# Patient Record
Sex: Female | Born: 1955 | Race: White | Hispanic: No | Smoking: Never smoker
Health system: Southern US, Community
[De-identification: ages and names within clinical notes are randomized; demographics above are authoritative.]

## PROBLEM LIST (undated history)

## (undated) DIAGNOSIS — S32049A Unspecified fracture of fourth lumbar vertebra, initial encounter for closed fracture: Secondary | ICD-10-CM

## (undated) DIAGNOSIS — I480 Paroxysmal atrial fibrillation: Secondary | ICD-10-CM

## (undated) DIAGNOSIS — I10 Essential (primary) hypertension: Secondary | ICD-10-CM

## (undated) DIAGNOSIS — I493 Ventricular premature depolarization: Secondary | ICD-10-CM

## (undated) DIAGNOSIS — E2740 Unspecified adrenocortical insufficiency: Secondary | ICD-10-CM

## (undated) DIAGNOSIS — R002 Palpitations: Secondary | ICD-10-CM

## (undated) HISTORY — DX: Palpitations: R00.2

## (undated) HISTORY — PX: HAND SURGERY: SHX662

## (undated) HISTORY — DX: Unspecified adrenocortical insufficiency: E27.40

## (undated) HISTORY — PX: NASAL SINUS SURGERY: SHX719

## (undated) HISTORY — DX: Ventricular premature depolarization: I49.3

## (undated) HISTORY — DX: Essential (primary) hypertension: I10

---

## 1999-09-18 ENCOUNTER — Encounter: Admission: RE | Admit: 1999-09-18 | Discharge: 1999-09-18 | Payer: Self-pay | Admitting: Cardiology

## 1999-09-19 ENCOUNTER — Encounter: Payer: Self-pay | Admitting: Cardiology

## 2002-11-15 ENCOUNTER — Encounter (INDEPENDENT_AMBULATORY_CARE_PROVIDER_SITE_OTHER): Payer: Self-pay | Admitting: *Deleted

## 2002-11-15 ENCOUNTER — Ambulatory Visit (HOSPITAL_COMMUNITY): Admission: RE | Admit: 2002-11-15 | Discharge: 2002-11-15 | Payer: Self-pay | Admitting: Gastroenterology

## 2003-06-20 ENCOUNTER — Encounter: Payer: Self-pay | Admitting: Internal Medicine

## 2003-06-20 ENCOUNTER — Encounter: Admission: RE | Admit: 2003-06-20 | Discharge: 2003-06-20 | Payer: Self-pay | Admitting: Internal Medicine

## 2006-04-03 ENCOUNTER — Inpatient Hospital Stay (HOSPITAL_COMMUNITY): Admission: EM | Admit: 2006-04-03 | Discharge: 2006-04-06 | Payer: Self-pay | Admitting: Emergency Medicine

## 2006-04-04 ENCOUNTER — Ambulatory Visit: Payer: Self-pay | Admitting: Internal Medicine

## 2006-04-04 ENCOUNTER — Encounter (INDEPENDENT_AMBULATORY_CARE_PROVIDER_SITE_OTHER): Payer: Self-pay | Admitting: Specialist

## 2006-09-03 ENCOUNTER — Emergency Department (HOSPITAL_COMMUNITY): Admission: EM | Admit: 2006-09-03 | Discharge: 2006-09-03 | Payer: Self-pay | Admitting: Emergency Medicine

## 2006-09-08 ENCOUNTER — Emergency Department (HOSPITAL_COMMUNITY): Admission: EM | Admit: 2006-09-08 | Discharge: 2006-09-08 | Payer: Self-pay | Admitting: Emergency Medicine

## 2007-04-20 ENCOUNTER — Ambulatory Visit (HOSPITAL_COMMUNITY): Admission: RE | Admit: 2007-04-20 | Discharge: 2007-04-20 | Payer: Self-pay | Admitting: Gastroenterology

## 2007-07-20 ENCOUNTER — Ambulatory Visit (HOSPITAL_COMMUNITY): Admission: RE | Admit: 2007-07-20 | Discharge: 2007-07-20 | Payer: Self-pay | Admitting: Gastroenterology

## 2007-11-28 ENCOUNTER — Emergency Department (HOSPITAL_COMMUNITY): Admission: EM | Admit: 2007-11-28 | Discharge: 2007-11-28 | Payer: Self-pay | Admitting: Emergency Medicine

## 2007-12-23 ENCOUNTER — Ambulatory Visit: Payer: Self-pay | Admitting: Hematology and Oncology

## 2007-12-24 LAB — CBC WITH DIFFERENTIAL/PLATELET
BASO%: 0.8 % (ref 0.0–2.0)
Eosinophils Absolute: 0 10*3/uL (ref 0.0–0.5)
HCT: 42.2 % (ref 34.8–46.6)
HGB: 14.4 g/dL (ref 11.6–15.9)
MCH: 29.9 pg (ref 26.0–34.0)
MONO#: 0.9 10*3/uL (ref 0.1–0.9)
MONO%: 6.4 % (ref 0.0–13.0)
NEUT#: 12.4 10*3/uL — ABNORMAL HIGH (ref 1.5–6.5)
Platelets: 324 10*3/uL (ref 145–400)
WBC: 14.7 10*3/uL — ABNORMAL HIGH (ref 3.9–10.0)
lymph#: 1.2 10*3/uL (ref 0.9–3.3)

## 2007-12-24 LAB — MORPHOLOGY: PLT EST: ADEQUATE

## 2007-12-29 LAB — COMPREHENSIVE METABOLIC PANEL
ALT: 14 U/L (ref 0–35)
AST: 18 U/L (ref 0–37)
Albumin: 4.3 g/dL (ref 3.5–5.2)
Chloride: 100 mEq/L (ref 96–112)
Glucose, Bld: 101 mg/dL — ABNORMAL HIGH (ref 70–99)
Potassium: 4 mEq/L (ref 3.5–5.3)
Total Protein: 7.1 g/dL (ref 6.0–8.3)

## 2008-01-21 LAB — CBC WITH DIFFERENTIAL/PLATELET
BASO%: 0.1 % (ref 0.0–2.0)
HCT: 41.2 % (ref 34.8–46.6)
LYMPH%: 8.6 % — ABNORMAL LOW (ref 14.0–48.0)
MCH: 29.9 pg (ref 26.0–34.0)
MCHC: 33.8 g/dL (ref 32.0–36.0)
MCV: 88.4 fL (ref 81.0–101.0)
MONO%: 8 % (ref 0.0–13.0)
NEUT#: 12.1 10*3/uL — ABNORMAL HIGH (ref 1.5–6.5)
NEUT%: 83.2 % — ABNORMAL HIGH (ref 39.6–76.8)
RBC: 4.66 10*6/uL (ref 3.70–5.32)
WBC: 14.6 10*3/uL — ABNORMAL HIGH (ref 3.9–10.0)

## 2008-06-09 ENCOUNTER — Ambulatory Visit (HOSPITAL_COMMUNITY): Admission: RE | Admit: 2008-06-09 | Discharge: 2008-06-09 | Payer: Self-pay | Admitting: Internal Medicine

## 2008-09-08 ENCOUNTER — Encounter (HOSPITAL_COMMUNITY): Admission: RE | Admit: 2008-09-08 | Discharge: 2008-09-22 | Payer: Self-pay | Admitting: Internal Medicine

## 2008-10-10 ENCOUNTER — Encounter: Admission: RE | Admit: 2008-10-10 | Discharge: 2008-10-10 | Payer: Self-pay | Admitting: Internal Medicine

## 2008-12-07 ENCOUNTER — Encounter (HOSPITAL_COMMUNITY): Admission: RE | Admit: 2008-12-07 | Discharge: 2009-01-26 | Payer: Self-pay | Admitting: Internal Medicine

## 2009-05-25 ENCOUNTER — Encounter (HOSPITAL_COMMUNITY): Admission: RE | Admit: 2009-05-25 | Discharge: 2009-06-22 | Payer: Self-pay | Admitting: Orthopaedic Surgery

## 2009-06-26 ENCOUNTER — Encounter (HOSPITAL_COMMUNITY): Admission: RE | Admit: 2009-06-26 | Discharge: 2009-07-26 | Payer: Self-pay | Admitting: Orthopaedic Surgery

## 2009-07-27 ENCOUNTER — Encounter (HOSPITAL_COMMUNITY): Admission: RE | Admit: 2009-07-27 | Discharge: 2009-08-26 | Payer: Self-pay | Admitting: Orthopaedic Surgery

## 2010-06-20 ENCOUNTER — Encounter (HOSPITAL_COMMUNITY): Admission: RE | Admit: 2010-06-20 | Discharge: 2010-06-22 | Payer: Self-pay | Admitting: Orthopaedic Surgery

## 2010-06-26 ENCOUNTER — Encounter (HOSPITAL_COMMUNITY): Admission: RE | Admit: 2010-06-26 | Discharge: 2010-07-26 | Payer: Self-pay | Admitting: Family Medicine

## 2010-08-01 ENCOUNTER — Encounter (HOSPITAL_COMMUNITY)
Admission: RE | Admit: 2010-08-01 | Discharge: 2010-08-31 | Payer: Self-pay | Source: Home / Self Care | Attending: Family Medicine | Admitting: Family Medicine

## 2010-09-05 ENCOUNTER — Encounter (HOSPITAL_COMMUNITY)
Admission: RE | Admit: 2010-09-05 | Discharge: 2010-10-05 | Payer: Self-pay | Source: Home / Self Care | Attending: Orthopaedic Surgery | Admitting: Orthopaedic Surgery

## 2010-10-14 ENCOUNTER — Encounter: Payer: Self-pay | Admitting: Internal Medicine

## 2010-10-14 ENCOUNTER — Encounter: Payer: Self-pay | Admitting: Gastroenterology

## 2011-02-08 NOTE — Op Note (Signed)
NAMEJEANELLE, DAKE                ACCOUNT NO.:  [id number]   MEDICAL RECORD NO.:  [id number]          PATIENT TYPE:  INP   LOCATION:  A312                          FACILITY:  [facility]   PHYSICIAN:  Lionel December, M.D.    DATE OF BIRTH:  [birth year]   DATE OF PROCEDURE:  04/04/2006  DATE OF DISCHARGE:                                 OPERATIVE REPORT   PROCEDURE:  Flexible sigmoidoscopy.   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  A CT reveals thickened descending colon in  sigmoid as well as the rectum.  Stool studies are pending.  She is  undergoing diagnostic sigmoidoscopy to make sure she does not have any  associated colitis such as CMV or C diff.  Procedure and risks were reviewed  with the patient, and informed consent was obtained.   MEDICATIONS FOR CONSCIOUS SEDATION:  Demerol 25 mg IV, Versed 4 mg IV.   FINDINGS:  Procedure performed in endoscopy suite.  The patient's vital  signs and O2 saturation were monitored during the procedure and remained  stable.  The patient was placed in left lateral position and rectal  examination performed.  She was complaining of some discomfort.  Therefore,  Xylocaine jelly was applied to anal canal.  Olympus videoscope was placed in  rectum where mucosa was noted to be diffusely erythematous, friable with  multiple ulcers.  Scope was passed into sigmoid colon where there were  similar changes.  Scope was passed to the junction of sigmoid and descending  colon.  Did not attempt to pass the scope any further.  Pictures taken for  the record.  As the scope was withdrawn, mucosa was once again carefully  examined, and there were no polyps or pseudomembranes.  Biopsy was taken  from the sigmoid colon and rectum and submitted in separate containers.  Endoscope was withdrawn.  The patient tolerated the procedure  well.   FINAL DIAGNOSIS:  Active colitis involving the rectum and sigmoid colon.  Based on CT, it would appear to be distal disease limited to splenic  flexure.  I suspect this is relapse of her ulcerative colitis.  Biopsy and  cultures will rule out enteric infections or CMV colitis, etc.   RECOMMENDATIONS:  Will continue Solu-Medrol at 40 mg IV q.12h. along with  her Colazal and Rowasa enemas.   She will have CBC in the a.m.  We will also place a PPD just in case  Remicade needed at a later date.      Lionel December, M.D.  Electronically Signed     NR/MEDQ  D:  04/04/2006  T:  04/04/2006  Job:  [id number]   cc:   Bernette Redbird, M.D.  Fax: [phone]

## 2011-02-08 NOTE — Discharge Summary (Signed)
NAMEMATTILYNN, Betty Bryan                ACCOUNT NO.:  [id number]   MEDICAL RECORD NO.:  [id number]          PATIENT TYPE:  INP   LOCATION:  A312                          FACILITY:  [facility]   PHYSICIAN:  Margaretmary Dys, M.D.DATE OF BIRTH:  Feb 12, 1956   DATE OF ADMISSION:  04/03/2006  DATE OF DISCHARGE:  07/15/2007LH                                 DISCHARGE SUMMARY   DISCHARGE DIAGNOSES:  Acute exacerbation of ulcerative colitis.   DISCHARGE MEDICATIONS:  1.  Colazal 750 mg three capsules three times a day.  2.  Atenolol 50 mg one quarter tablet b.i.d.  3.  Entocort 3 mg capsules, three capsules every morning.  4.  Vitamin D 50,000 units one capsule three times a week.  5.  Hyoscyamine 0.125 mg.  6.  Flovent 50 mcg.  7.  Claritin 10 mg p.o. daily.  8.  Prevacid 30 mg p.o. daily.  9.  Rowasa p.r.n. 2-3 times weekly.  10. Prednisone 20 mg p.o. daily until seen by her GI specialist Dr. Matthias Hughs.   DISCHARGE INSTRUCTIONS:  1.  Diet:  Regular diet.  2.  Activities:  As tolerated.   CONSULTATIONS:  GI.   PROCEDURES:  Colonoscopy with finding of extensive ulcerative colitis.  Could not rule out small bowel obstruction distally.   HOSPITAL COURSE:  [name] is a 55 year old Caucasian female with 9-year  history of distal ulcerative colitis who was doing fairly well the few weeks  prior to admission when she noticed scant amount of bleeding with bowel  movements in the morning.  She was normally referred by Dr. Matthias Hughs.  The  patient also complains of some dysphagia.  She was subsequently admitted to  the medical floor for evaluation due to left sided abdominal pain, bloody  diarrhea and thickened half of the colon which was seen on CT scan  consistent with ulcerative colitis, the patient has done fairly well, and  after the dose of steroids were introduced for  ulcerative colitis, the  patient did fairly well.   All other home medications were resumed.  The patient did fairly well  during  the course of admission, and I saw her on April 06, 2006, and she was doing  great, and she was discharged home in satisfactory condition.   SPECIAL INSTRUCTIONS:  The patient will need the   END OF DICTATION.      Margaretmary Dys, M.D.  Electronically Signed     AM/MEDQ  D:  04/17/2006  T:  04/17/2006  Job:  [id number]

## 2011-02-08 NOTE — Consult Note (Signed)
Betty Bryan                ACCOUNT NO.:  [id number]   MEDICAL RECORD NO.:  [id number]          PATIENT TYPE:  INP   LOCATION:  A312                          FACILITY:  [facility]   PHYSICIAN:  Lionel December, M.D.    DATE OF BIRTH:  [birth year]   DATE OF CONSULTATION:  04/03/2006  DATE OF DISCHARGE:                                   CONSULTATION   REASON FOR CONSULTATION:  Left-sided abdominal pain, bloody diarrhea, and  thickened distal half of the colon (CT) in a patient with known ulcerative  colitis.   HISTORY OF PRESENT ILLNESS:  Betty Bryan is a 55 year old Caucasian female with a  nine-year history of distal ulcerative colitis who has been doing fairly  well until a week ago when she noted scant amount of bleeding with bowel  movements usually in the morning.  She was actually seen by Dr. Bernette Redbird, who is the patient's gastroenterologist, three days ago and she was  complaining more of dysphagia and less about her bowels.  For for the last  two days or so she has been having excruciating postprandial left upper  quadrant pain and has had multiple stools.  Yesterday, she had over ten  stools and she had multiple stools today.  She has been afraid to eat.  She  describes pain as severe gas pain which doubled her over.  She did not  experience nausea, vomiting, fever, or chills.  With the diarrhea she knew  that she would not be able to make it to [facility] and she, therefore, came  to the emergency room at [facility].  She was evaluated and noted to have  WBC of 17.1.  She had abdominopelvic CT which revealed thickened descending  colon, sigmoid colon as well as rectum.  Proximal half of colon is normal.  No free fluid noted.  She had fibroid in her uterus and a 1-cm lesion  involving the left lobe of the liver.  She was, therefore, admitted to Dr.  Chancy Milroy service for management.  The patient states she has had UC for  nine years.  She initially was on Asacol which she  believes she took for  seven and a half years and did great for at least five.  For the last 18  months she also has been on Antrocol.  She states few times she has come off  her, the  diarrhea has relapsed, and she has gone back on it.  For the last  few months she has been also using Rowasa every second or third night.  Her  last colonoscopy, according to Dr. Matthias Hughs, was three years ago and her  disease was an active.  She has lost a few pounds recently.  She feels it is  due to GI fluid losses.  She was surprised to find her weight at 101.4  pounds.  She states she generally is around 108 or 109 pounds.  She has had  a good appetite.  There is no history of recent antibiotic use or travel out  of country.  She drinks city water.  She was recently diagnosed with  osteoporosis and vitamin D deficiency.  She states she has been intolerant  of calcium as she gets bloating and diarrhea.  The patient states she has  never been hospitalized for UC or any other conditions.   MEDICATIONS:  Her usual meds at home include Colazal 2.25 gm t.i.d.,  Antrocol EC 9 mg daily, Rowasa enema one every two to three days, Flonase  nasal spray daily,  ferritin 10 mg daily,  atenolol 12.5 mg daily, folic  acid 400 mcg daily, and vitamin B6, B12, and zinc daily.   She was also given a prescription for vitamin D 50,000 units three times a  week.   PAST MEDICAL HISTORY:  1.  History of ulcerative colitis as above.  2.  History of mild tachycardia, well controlled with low-dose beta blocker.  3.  She recently had a bone density study and was diagnosed with      osteoporosis.  She remembers her T value was -2.6.  She had vitamin D      level of 25, hydroxycholecalciferol level was low at 13.8.  The patient      states she has tested negative for celiac disease before.   PAST SURGICAL HISTORY:  Nasal surgery for deflected nasal septum.   ALLERGIES:  PENICILLIN, SULFA both causing throat swelling.   FAMILY  HISTORY:  Both parents in their 89s and doing fairly well.  She has a  brother with GERD and a sister who is 17 years old.  She has had ileoanal  pull-through for ulcerative colitis and is now doing well.   SOCIAL HISTORY:  She is divorced.  She does not have any children.  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  She has never smoked cigarettes and  does not drink alcohol.   PHYSICAL EXAMINATION:  GENERAL:  A pleasant, well-developed, thin, Caucasian  female, who does not appear to be in acute distress.  VITAL SIGNS:  Admission weight 101.4 pounds.  She is 5 feet 1 inch tall.  Pulse 68 per minute, blood pressure 103/68, temperature is 97.8,  respirations 20.  HEENT:  Conjunctivae is pink.  Sclerae is nonicteric.  Oropharyngeal mucosa  is normal.  NECK: No neck masses or thyromegaly noted.  CARDIAC:  Regular rhythm.  Normal S1, S2.  No murmur or gallop noted.  ABDOMEN:  Flat.  Bowel sounds are hyperactive.  On palpation, she has mild  tenderness at LLQ without guarding or rebound.  No organomegaly or masses  noted.  RECTAL:  Deferred.  She does not have peripheral edema, skin rash, or  clubbing.   LABORATORY DATA:  From admission WBC is 17.1, H&H 13.8 and 41.0, platelet  count is 325,000. She has 87 segs and 0 bands.  Sodium 138, potassium is  3.4, chloride 103, CO2 is 28, glucose 92, BUN 7, creatinine 0.8, bilirubin  0.6, ALP 55, AST 21, ALT 11, total protein 7.3, albumin is 3.6.  Her lipase  was 47.   CT findings as above.   ASSESSMENT:  Betty Bryan is 55 year old Caucasian female history of ulcerative  colitis, who is on maintenance therapy, who presents with recent onset of  bloody diarrhea, left-sided pain, as well as leukocytosis.  She is afebrile.  No history of recent antibiotic use or travel.  Her last colonoscopy,  according to Dr. Matthias Hughs with whom I have talked with over the  phone, was about  three years ago and a disease was quiescent.  He is very interested to  know what is the status of her colon given her presentation.   I suspect we are dealing with relapse of her ulcerative colitis.  Enteric  infection remains a possibility as well as Clostridium difficile colitis or  CMV colitis in setting of chronic UC.  I suspect the risk is a very low.   RECOMMENDATIONS:  1.  Analgesia as you are doing.  2.  Solu-Medrol 40 mg IV q.12h.  3.  Stool studies have already been ordered by Dr. Rito Ehrlich.  4.  Will continue her Colazal and hold her Enterocort.  Will also give her      Rowasa enema one at bedtime.  5.  Levsin SL with meals.  6.  She will undergo diagnostic flexible sigmoidoscopy with conscious      sedation in a.m.  7.  Since the patient has osteoporosis, our goal would be to get her off      corticosteroids within the next two to three weeks.   We would like to thank Dr. Rito Ehrlich for the opportunity to participate in  the care of this very nice lady.      Lionel December, M.D.  Electronically Signed     NR/MEDQ  D:  04/03/2006  T:  04/04/2006  Job:  [id number]   cc:   Bernette Redbird, M.D.  Fax: [phone]

## 2011-02-08 NOTE — H&P (Signed)
Betty Bryan, Betty Bryan                ACCOUNT NO.:  [id number]   MEDICAL RECORD NO.:  [id number]          PATIENT TYPE:  INP   LOCATION:  A312                          FACILITY:  [facility]   PHYSICIAN:  Osvaldo Shipper, MD     DATE OF BIRTH:  1955-10-11   DATE OF ADMISSION:  04/03/2006  DATE OF DISCHARGE:  LH                                HISTORY & PHYSICAL   The patient's primary doctor is Dr. Rodrigo Ran, at [facility].  The  patient's gastroenterologist Dr. Matthias Hughs, at [facility] as well.   ADMITTING DIAGNOSES:  1.  Exacerbation of ulcerative colitis.  2.  History of tachycardia of unclear etiology.  3.  History of SEASONAL ALLERGIES.   CHIEF COMPLAINT:  Abdominal pain and diarrhea.   HISTORY OF PRESENT ILLNESS:  The patient is a 55 year old Caucasian female  with a 9 year history of ulcerative colitis who follows with Dr. Matthias Hughs at  [facility].  Presented today to the ED after experiencing left-sided  abdominal pain and diarrhea over the past few days.  She actually saw Dr.  Matthias Hughs on Monday; however, she saw for dysphagia and hence this issue did  not come up.  Today patient's symptoms got worse.  She complains of  increasing pain after she eats food.  She also complains of cramping type of  pain.  She has also had at least 10 episodes of watery stools, most of them  mixed with blood.  She has not had any nausea, vomiting, however.   Patient has also been having some difficulty with swallowing for the past  few weeks as well.  This is both to solids and liquids.  She feels the food  is getting stuck in her throat.  She is able to swallow, however; she drinks  a lot of fluids.  She has not lost any weight recently.  Denies any fever or  chills over the past few days.  No history of food regurgitation over the  past few days as well. She mentioned that she is going to get a barium  swallow study to evaluate her dysphagia in the next few days.   MEDICATIONS AT HOME:  She takes:  1.  Colazal 750 mg, Three tabs, three times a day.  2.  She takes Entocort 9 mg in the morning.  3.  She also takes Rowasa enema as needed and she has been taking this for      the past few days as well.  4.  She is also on atenolol 25 mg;  however, she takes half of this, 12.5      mg, twice a day.  5.  She also takes, she said, 1/4 of 0.125 mg of hyoscyamine sulfate twice a      day.  6.  She is also on Vitamin D, Claritin and Flonase nasal spray.   ALLERGIES:  1.  PENICILLIN.  2.  SULFA MEDICATIONS.   PAST MEDICAL HISTORY:  1.  Ulcerative colitis for 9 years.  2.  She has had sinus surgery in the past; otherwise, no other surgical  procedures.  3.  She also has a history of tachycardia of unknown kind for which she was      put on atenolol.  This could be PSVT.   SOCIAL HISTORY:  Lives in [place], lives alone.  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  No smoking use, no alcohol use, no illicit drug use.   FAMILY HISTORY:  Sister has ulcerative colitis, underwent total colectomy.  Mother has ulcerative colitis as well.   REVIEW OF SYSTEMS:  Unremarkable except as mentioned in the HPI.   PHYSICAL EXAMINATION:  VITAL SIGNS:  Temp is 97.1, heart rate in the 70's-  80's, blood pressure 106/63, respiratory rate is 18, saturation 99% on room  air.  LUNGS:  Clear to auscultation bilaterally.  CARDIOVASCULAR:  S1, S2 is normal, regular, no murmurs appreciated.  ABDOMEN:  Soft.  There is tenderness in the left lower quadrant, no rebound,  rigidity or guarding.  Bowel sounds are present, normal, no mass or  organomegaly appreciated.  EXTREMITIES:  Without edema, peripheral pulses are palpable.   LAB DATA:  Her white count is 17,100 with 87% neutrophils, hemoglobin is  13.8, MCV is 87, platelet count of 325.  The potassium is 3.4.  Other  parameters on the CMET is normal.  Lipase is 47, UA is unremarkable.   Patient underwent a CAT scan of her abdomen and pelvis which showed  wall  thickening on the left side of the colon.  Incidentally, a 1 cm liver lesion  is also noted, unable to characterize.  A uterine fibroid was also noted.   IMPRESSION:  This is a 55 year old Caucasian female with a longstanding  history of ulcerative colitis who presents with worsening of her symptoms  over the past few days. Patient likely has exacerbation of her ulcerative  colitis.  Other differentials are also to be considered at this time.  These  include Clostridium difficile colitis, cytomegalovirus colitis as well.   PLAN:  1.  Ulcerative colitis.  I have discussed this case briefly with Dr. Karilyn Cota.      He will be in to see this patient later today.  However, he thinks that      she might benefit from some IV steroids.  We will start her on Solu-      Medrol IV q.12h.  Check an ESR, continue her cortisol for now.  2.  History of tachycardia of unknown kind.  Continue atenolol for now.  Her      heart rate is okay so we will not get an EKG for now.  3.  Patient is ambulatory.  Will encourage her to do so to prevent DVTs.  GI      prophylaxis will be provided.      Osvaldo Shipper, MD  Electronically Signed     GK/MEDQ  D:  04/03/2006  T:  04/03/2006  Job:  [id number]   cc:   Bernette Redbird, M.D.  Fax: [phone]

## 2011-06-17 LAB — BASIC METABOLIC PANEL
BUN: 10
Calcium: 8.6
Creatinine, Ser: 0.78
GFR calc non Af Amer: >60
Sodium: 137

## 2011-06-17 LAB — POCT CARDIAC MARKERS
CKMB, poc: 1.5
Myoglobin, poc: 47.5
Troponin i, poc: <0.05

## 2011-06-17 LAB — DIFFERENTIAL
Basophils Absolute: 0.1
Lymphocytes Relative: 12
Lymphs Abs: 1.4

## 2011-06-17 LAB — D-DIMER, QUANTITATIVE: D-Dimer, Quant: 2.76 — ABNORMAL HIGH

## 2011-06-17 LAB — CBC
Hemoglobin: 13.6
Platelets: 278
RBC: 4.51
WBC: 12 — ABNORMAL HIGH

## 2012-02-04 ENCOUNTER — Encounter: Payer: Self-pay | Admitting: *Deleted

## 2012-06-05 ENCOUNTER — Encounter: Payer: Self-pay | Admitting: Cardiovascular Disease

## 2012-12-23 ENCOUNTER — Other Ambulatory Visit: Payer: Self-pay | Admitting: Gastroenterology

## 2013-03-02 ENCOUNTER — Other Ambulatory Visit (HOSPITAL_COMMUNITY): Payer: Self-pay | Admitting: Internal Medicine

## 2013-03-02 DIAGNOSIS — Z139 Encounter for screening, unspecified: Secondary | ICD-10-CM

## 2013-03-09 ENCOUNTER — Ambulatory Visit (HOSPITAL_COMMUNITY)
Admission: RE | Admit: 2013-03-09 | Discharge: 2013-03-09 | Disposition: A | Discharge status: Home / Self Care | Payer: BC Managed Care – PPO | Source: Ambulatory Visit | Attending: Internal Medicine | Admitting: Internal Medicine

## 2013-03-09 DIAGNOSIS — Z139 Encounter for screening, unspecified: Secondary | ICD-10-CM

## 2013-03-09 DIAGNOSIS — Z1231 Encounter for screening mammogram for malignant neoplasm of breast: Secondary | ICD-10-CM | POA: Insufficient documentation

## 2014-09-12 ENCOUNTER — Encounter (HOSPITAL_COMMUNITY): Payer: Self-pay

## 2014-09-12 ENCOUNTER — Ambulatory Visit (HOSPITAL_COMMUNITY)
Admission: RE | Admit: 2014-09-12 | Discharge: 2014-09-12 | Disposition: A | Discharge status: Home / Self Care | Payer: BC Managed Care – PPO | Source: Ambulatory Visit | Attending: Orthopedic Surgery | Admitting: Orthopedic Surgery

## 2014-09-12 DIAGNOSIS — Z5189 Encounter for other specified aftercare: Secondary | ICD-10-CM | POA: Diagnosis not present

## 2014-09-12 DIAGNOSIS — M25641 Stiffness of right hand, not elsewhere classified: Secondary | ICD-10-CM | POA: Insufficient documentation

## 2014-09-12 DIAGNOSIS — S63659A Sprain of metacarpophalangeal joint of unspecified finger, initial encounter: Secondary | ICD-10-CM

## 2014-09-12 DIAGNOSIS — M25649 Stiffness of unspecified hand, not elsewhere classified: Secondary | ICD-10-CM

## 2014-09-12 NOTE — Patient Instructions (Signed)
AROM: DIP Flexion / Extension   Pinch middle knuckle of ________ finger of right hand to prevent bending. Bend end knuckle until stretch is felt. Hold ____ seconds. Relax. Straighten finger as far as possible. Repeat ____ times per set. Do ____ sets per session. Do ____ sessions per day.  Copyright  VHI. All rights reserved.    AROM: PIP Flexion / Extension   Pinch bottom knuckle of ________ finger of right hand to prevent bending. Actively bend middle knuckle until stretch is felt. Hold ____ seconds. Relax. Straighten finger as far as possible. Repeat ____ times per set. Do ____ sets per session. Do ____ sessions per day.  Copyright  VHI. All rights reserved.   AROM: Finger Flexion / Extension   Actively bend fingers of right hand. Start with knuckles furthest from palm, and slowly make a fist. Hold ____ seconds. Relax. Then straighten fingers as far as possible. Repeat ____ times per set. Do ____ sets per session. Do ____ sessions per day.  Copyright  VHI. All rights reserved.   Paper Crumpling Exercise   Begin with right palm down on piece of paper. Maintaining contact between surface and heel of hand, crumple paper into a ball. Repeat ____ times per set. Do ____ sets per session. Do ____ sessions per day.  Copyright  VHI. All rights reserved.     Towel Roll Squeeze   With right forearm resting on surface, gently squeeze towel. Repeat ____ times per set. Do ____ sets per session. Do ____ sessions per day.  Copyright  VHI. All rights reserved.     *Strengthening Exercises*   Home Exercises Program Theraputty Exercises  Do the following exercises 2 times a day using your affected hand.  1. Roll putty into a ball.  2. Make into a pancake.  3. Roll putty into a roll.  4. Pinch along log with first finger and thumb.   5. Make into a ball.  6. Roll it back into a log.   7. Pinch using thumb and side of first finger.  8. Roll into a ball, then  flatten into a pancake.  9. Using your fingers, make putty into a mountain.

## 2014-09-12 NOTE — Therapy (Signed)
[facility] [address] [place], [place], [address] Phone: [phone]   Fax:  [phone]  Occupational Therapy Evaluation  Patient Details  Name: Betty Bryan MRN: [id number] Date of Birth: Feb 09, 1956  Encounter Date: 09/12/2014      OT End of Session - 09/12/14 1938    Visit Number 1   Number of Visits 1   Authorization Type BCBS   OT Start Time 1800   OT Stop Time 1838   OT Time Calculation (min) 38 min   Activity Tolerance Patient tolerated treatment well   Behavior During Therapy [facility] for tasks assessed/performed      Past Medical History  Diagnosis Date  . Palpitations   . PVC's (premature ventricular contractions)   . Ulcerative colitis   . HTN (hypertension)   . Adrenal insufficiency     Past Surgical History  Procedure Laterality Date  . Nasal sinus surgery      There were no vitals taken for this visit.  Visit Diagnosis:  Sagittal band rupture at metacarpophalangeal joint, initial encounter  Decreased range of motion of finger      Subjective Assessment - 09/12/14 1925    Symptoms S: My range of motion is acually good.    Pertinent History Patient is a 58 y/o female s/p right 3rd finger sagittal band disruption with sx completed on 08/08/14. patient was given a fabricated hand splint which she is to wear for comfort only. Dr. Marlou Sa has referred patient to occupational therapy for evaluation and treatment.    Limitations ROM only. No strengthening.    Patient Stated Goals To increase strength.    Currently in Pain? No/denies  Pain can be a 5/10 at worse.     FOTO score: 60/100      [facility] OT Assessment - 09/12/14 1806    Assessment   Diagnosis s/p post-op 3rd finger sagittal band disruption   Onset Date 08/08/14   Prior Therapy None   Precautions   Precautions --  ROM only. No strengthening.   Required Braces or Orthoses Other Brace/Splint  hand splint for comfort measures only.   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   ADL   ADL comments Patient reports no difficulty completing BADL and IADL tasks.    Mobility   Mobility Status Independent   Written Expression   Dominant Hand Right   Vision - History   Baseline Vision No visual deficits   Cognition   Overall Cognitive Status Within Functional Limits for tasks assessed   Coordination   Gross Motor Movements are Fluid and Coordinated Yes   Fine Motor Movements are Fluid and Coordinated Yes   9 Hole Peg Test (p) --  Right: 17.9" Left: 18.12"   Edema   Edema Edema not present. MCPJ: 18cm bilateral hands.    AROM   Overall AROM Comments Right 3rd finger MCP: 80, PIP: 106; DIP: 50 2nd finger: MCP: 66, PIP: 72, DIP: 68. 4th finger: MCP: 62 PIP: 100, DIP: 50. Left 3rd finger MCP: 80, PIP: 112, DIP: 78   Strength   Grip (lbs) 55  Right Hand Lateral Pinch 15 lbs   Right Hand 3 Point Pinch 12 lbs   Grip (lbs) 60   Left Hand Lateral Pinch 16 lbs   Left Hand 3 Point Pinch 16 lbs                       OT Education - 09/12/14 1938    Education provided Yes   Education Details AROM digit exercises and theraputty exercises   Person(s) Educated Patient   Methods Explanation;Demonstration;Handout   Comprehension Verbalized understanding;Returned demonstration          OT Short Term Goals - 09/12/14 1943    OT SHORT TERM GOAL #1   Title Patient will be educated on HEP.    Time 1   Period Days   Status Achieved                  Plan - 09/12/14 1941    Clinical Impression Statement A: patient is a 58 y/o female s/p right 3rd finger sagitital hand  disruption with surgical repair on 08/08/14 causing decreased ROM and strength. Patient presents with remarkable ROM in 3rd finger. AROM is slightly off and patient feels comfortable completing a HEP at home independently.    Rehab Potential Excellent   OT Frequency 1x / week   OT Duration --  1 week   OT Treatment/Interventions Patient/family education   Plan P: 1 time eval with HEP.    OT Home Exercise Plan AROM finger exercises and theraputty   Consulted and Agree with Plan of Care Patient        Problem List There are no active problems to display for this patient.   Ailene Ravel, OTR/L,CBIS  [phone]  09/12/2014, 7:47 PM  [facility] [address] [place], [place], [address] Phone: [phone]   Fax:  [phone]

## 2014-09-15 ENCOUNTER — Emergency Department (HOSPITAL_COMMUNITY): Payer: BLUE CROSS/BLUE SHIELD

## 2014-09-15 ENCOUNTER — Emergency Department (HOSPITAL_COMMUNITY)
Admission: EM | Admit: 2014-09-15 | Discharge: 2014-09-15 | Disposition: A | Discharge status: Home / Self Care | Payer: BLUE CROSS/BLUE SHIELD | Attending: Emergency Medicine | Admitting: Emergency Medicine

## 2014-09-15 ENCOUNTER — Encounter (HOSPITAL_COMMUNITY): Payer: Self-pay | Admitting: Emergency Medicine

## 2014-09-15 DIAGNOSIS — Z7951 Long term (current) use of inhaled steroids: Secondary | ICD-10-CM | POA: Insufficient documentation

## 2014-09-15 DIAGNOSIS — W108XXA Fall (on) (from) other stairs and steps, initial encounter: Secondary | ICD-10-CM | POA: Diagnosis not present

## 2014-09-15 DIAGNOSIS — S50812A Abrasion of left forearm, initial encounter: Secondary | ICD-10-CM | POA: Insufficient documentation

## 2014-09-15 DIAGNOSIS — Z7952 Long term (current) use of systemic steroids: Secondary | ICD-10-CM | POA: Diagnosis not present

## 2014-09-15 DIAGNOSIS — S81811A Laceration without foreign body, right lower leg, initial encounter: Secondary | ICD-10-CM | POA: Diagnosis not present

## 2014-09-15 DIAGNOSIS — Z8639 Personal history of other endocrine, nutritional and metabolic disease: Secondary | ICD-10-CM | POA: Diagnosis not present

## 2014-09-15 DIAGNOSIS — Z880 Allergy status to penicillin: Secondary | ICD-10-CM | POA: Diagnosis not present

## 2014-09-15 DIAGNOSIS — Y9389 Activity, other specified: Secondary | ICD-10-CM | POA: Diagnosis not present

## 2014-09-15 DIAGNOSIS — R52 Pain, unspecified: Secondary | ICD-10-CM

## 2014-09-15 DIAGNOSIS — Y9289 Other specified places as the place of occurrence of the external cause: Secondary | ICD-10-CM | POA: Insufficient documentation

## 2014-09-15 DIAGNOSIS — Z79899 Other long term (current) drug therapy: Secondary | ICD-10-CM | POA: Insufficient documentation

## 2014-09-15 DIAGNOSIS — Z792 Long term (current) use of antibiotics: Secondary | ICD-10-CM | POA: Insufficient documentation

## 2014-09-15 DIAGNOSIS — I10 Essential (primary) hypertension: Secondary | ICD-10-CM | POA: Insufficient documentation

## 2014-09-15 DIAGNOSIS — Z8719 Personal history of other diseases of the digestive system: Secondary | ICD-10-CM | POA: Insufficient documentation

## 2014-09-15 DIAGNOSIS — Y998 Other external cause status: Secondary | ICD-10-CM | POA: Diagnosis not present

## 2014-09-15 DIAGNOSIS — W19XXXA Unspecified fall, initial encounter: Secondary | ICD-10-CM

## 2014-09-15 MED ORDER — LEVOFLOXACIN 500 MG PO TABS: 500.0000 mg | ORAL_TABLET | Freq: Every day | ORAL | Status: DC

## 2014-09-15 MED ORDER — LIDOCAINE HCL (PF) 1 % IJ SOLN
5.0000 mL | Freq: Once | INTRAMUSCULAR | Status: AC
  Administered 2014-09-15: 5 mL
  Filled 2014-09-15: qty 5

## 2014-09-15 MED ORDER — HYDROMORPHONE HCL 1 MG/ML IJ SOLN
1.0000 mg | Freq: Once | INTRAMUSCULAR | Status: AC
  Administered 2014-09-15: 1 mg via INTRAMUSCULAR
  Filled 2014-09-15: qty 1

## 2014-09-15 MED ORDER — LEVOFLOXACIN 500 MG PO TABS
500.0000 mg | ORAL_TABLET | Freq: Once | ORAL | Status: AC
  Administered 2014-09-15: 500 mg via ORAL
  Filled 2014-09-15: qty 1

## 2014-09-15 NOTE — ED Notes (Signed)
Pt up ambulating in room. Pt wants to use crutches so she doesn't fall again.

## 2014-09-15 NOTE — ED Notes (Signed)
Pt slipped on steps of back deck and lacerated her rt lower leg area )bandaged by EMS) and L. Forearm (dsg intact).

## 2014-09-15 NOTE — Discharge Instructions (Signed)
Clean laceration once a day gently with soap and water and then put a new dressing on.   Return here Saturday for recheck.   Follow up with your ortho md Monday or Tuesday or follow up with Dr. Aline Brochure ortho md in [place]

## 2014-09-15 NOTE — ED Provider Notes (Signed)
CSN: [id number]     Arrival date & time 09/15/14  1524 History   First MD Initiated Contact with Patient 09/15/14 1534     Chief Complaint  Patient presents with  . Fall     (Consider location/radiation/quality/duration/timing/severity/associated sxs/prior Treatment) Patient is a 58 y.o. female presenting with fall. The history is provided by the patient (the pt states she tripped and fell.  no loc.  lac to left forearm and right lower leg).  Fall This is a new problem. The current episode started less than 1 hour ago. The problem occurs constantly. The problem has not changed since onset.Pertinent negatives include no chest pain, no abdominal pain and no headaches. Nothing aggravates the symptoms. Nothing relieves the symptoms.    Past Medical History  Diagnosis Date  . Palpitations   . PVC's (premature ventricular contractions)   . Ulcerative colitis   . HTN (hypertension)   . Adrenal insufficiency    Past Surgical History  Procedure Laterality Date  . Nasal sinus surgery    . Hand surgery     Family History  Problem Relation Age of Onset  . Heart failure     History  Substance Use Topics  . Smoking status: Never Smoker   . Smokeless tobacco: Not on file  . Alcohol Use: Not on file   OB History    No data available     Review of Systems  Constitutional: Negative for appetite change and fatigue.  HENT: Negative for congestion, ear discharge and sinus pressure.   Eyes: Negative for discharge.  Respiratory: Negative for cough.   Cardiovascular: Negative for chest pain.  Gastrointestinal: Negative for abdominal pain and diarrhea.  Genitourinary: Negative for frequency and hematuria.  Musculoskeletal: Negative for back pain.       Laceration right lower leg and left forearm  Skin: Negative for rash.  Neurological: Negative for seizures and headaches.  Psychiatric/Behavioral: Negative for hallucinations.      Allergies  Penicillins and Sulfa  antibiotics  Home Medications   Prior to Admission medications   Medication Sig Start Date End Date Taking? Authorizing Provider  atenolol (TENORMIN) 50 MG tablet Take 25 mg by mouth daily.   Yes Historical Provider, MD  balsalazide (COLAZAL) 750 MG capsule Take 2,250 mg by mouth 3 (three) times daily.   Yes Historical Provider, MD  diazepam (VALIUM) 5 MG tablet Take 2.5 mg by mouth daily.    Yes Historical Provider, MD  fluticasone (FLONASE) 50 MCG/ACT nasal spray Place 1 spray into both nostrils daily.    Yes Historical Provider, MD  loratadine (CLARITIN) 10 MG tablet Take 10 mg by mouth daily.   Yes Historical Provider, MD  predniSONE (DELTASONE) 1 MG tablet Take 3 mg by mouth daily with breakfast.   Yes Historical Provider, MD  predniSONE (DELTASONE) 5 MG tablet Take 5 mg by mouth daily with breakfast.   Yes Historical Provider, MD  levofloxacin (LEVAQUIN) 500 MG tablet Take 1 tablet (500 mg total) by mouth daily. 09/15/14   Maudry Diego, MD   BP 128/70 mmHg  Pulse 72  Temp(Src) 97.6 F (36.4 C) (Oral)  Resp 20  Ht 5\' 2"  (1.575 m)  Wt 139 lb (63.05 kg)  BMI 25.42 kg/m2  SpO2 95% Physical Exam  Constitutional: She is oriented to person, place, and time. She appears well-developed.  HENT:  Head: Normocephalic.  Eyes: Conjunctivae and EOM are normal.  Neck: Neck supple. No tracheal deviation present.  Cardiovascular:  No murmur heard.  Pulmonary/Chest: No respiratory distress.  Musculoskeletal: Normal range of motion.  Abrasion to left forearm.   20 cm lac to right lower leg.   Neuro vasc nl  Neurological: She is oriented to person, place, and time.  Skin: Skin is warm.  Psychiatric: She has a normal mood and affect. Her behavior is normal.    ED Course  LACERATION REPAIR Date/Time: 09/15/2014 6:54 PM Performed by: Paddy Walthall L Authorized by: Milton Ferguson L Comments: Laceration right lower leg 20 cm.   Laceration cleaned with antiseptic and saline and 20     Sutures 4-0 and 3-0 nylon used to close laceration.   The laceration could not be completely closed due to taunt thin skin.     (including critical care time) Labs Review Labs Reviewed - No data to display  Imaging Review Dg Tibia/fibula Right  09/15/2014   CLINICAL DATA:  58 year old female with right lower leg laceration and pain after falling down would index steps  EXAM: RIGHT TIBIA AND FIBULA - 2 VIEW  COMPARISON:  None.  FINDINGS: Soft tissue irregularity along the anterior aspect of the distal leg overlying the tibia consistent with the clinical history of laceration. There is no evidence of acute fracture or malalignment. No radiopaque foreign body.  IMPRESSION: Soft tissue injury without evidence of fracture, malalignment or retained foreign body.   Electronically Signed   By: Jacqulynn Cadet M.D.   On: 09/15/2014 16:00     EKG Interpretation None      MDM   Final diagnoses:  Pain  Fall, initial encounter  Leg laceration, right, initial encounter    Laceration right lower leg 20 cm.   Pt will follow up in two days for recheck and will follow up with ortho in 4-5 days.   She is taking tylenol with codeine for pain and levaquin to prevent infection    Maudry Diego, MD 09/15/14 1859

## 2014-09-27 ENCOUNTER — Encounter (HOSPITAL_COMMUNITY): Payer: Self-pay

## 2014-12-23 ENCOUNTER — Ambulatory Visit (HOSPITAL_COMMUNITY): Payer: BLUE CROSS/BLUE SHIELD | Attending: Orthopedic Surgery | Admitting: Physical Therapy

## 2014-12-23 DIAGNOSIS — M25651 Stiffness of right hip, not elsewhere classified: Secondary | ICD-10-CM | POA: Diagnosis not present

## 2014-12-23 DIAGNOSIS — R262 Difficulty in walking, not elsewhere classified: Secondary | ICD-10-CM

## 2014-12-23 DIAGNOSIS — M545 Low back pain: Secondary | ICD-10-CM | POA: Insufficient documentation

## 2014-12-23 DIAGNOSIS — R29898 Other symptoms and signs involving the musculoskeletal system: Secondary | ICD-10-CM

## 2014-12-23 DIAGNOSIS — R6889 Other general symptoms and signs: Secondary | ICD-10-CM

## 2014-12-23 DIAGNOSIS — M6281 Muscle weakness (generalized): Secondary | ICD-10-CM | POA: Diagnosis not present

## 2014-12-23 DIAGNOSIS — M5441 Lumbago with sciatica, right side: Secondary | ICD-10-CM

## 2014-12-23 NOTE — Therapy (Signed)
[place], [place], [address] Phone: [phone]   Fax:  [phone]  Physical Therapy Evaluation  Patient Details  Name: Betty Bryan MRN: [id number] Date of Birth: 05/30/1956 Referring Provider:  Newt Minion, MD  Encounter Date: 12/23/2014      PT End of Session - 12/23/14 1708    Visit Number 1   Number of Visits 16   Date for PT Re-Evaluation 01/22/15   Authorization Type BCBS   Authorization Time Period 12/23/14 - 02/22/15   Authorization - Visit Number 1   Authorization - Number of Visits 16   PT Start Time 1520   PT Stop Time 1600   PT Time Calculation (min) 40 min   Activity Tolerance Patient tolerated treatment well   Behavior During Therapy [facility] for tasks assessed/performed      Past Medical History  Diagnosis Date  . Palpitations   . PVC's (premature ventricular contractions)   . Ulcerative colitis   . HTN (hypertension)   . Adrenal insufficiency     Past Surgical History  Procedure Laterality Date  . Nasal sinus surgery    . Hand surgery      There were no vitals filed for this visit.  Visit Diagnosis:  Right-sided low back pain with right-sided sciatica - Plan: PT plan of care cert/re-cert  Hip stiffness, right - Plan: PT plan of care cert/re-cert  Weakness of both lower extremities - Plan: PT plan of care cert/re-cert  Difficulty walking - Plan: PT plan of care cert/re-cert  Postural instability of trunk - Plan: PT plan of care cert/re-cert      Subjective Assessment - 12/23/14 1533    Subjective pin radiates from back in to hip and Rt lateral foot. "I feel like my gait is off."    Pertinent History Patient has had low back pain and Rt hip popping for last several months (since fall). patient had fallen 3 months ago on Christmas eve and slipped down syeps hurting shoin resulting in a very deep wound which has since mostly healed.    How long can you sit comfortably? no pain    How long can you stand comfortably? okay except for prolonged standing   How long can you walk comfortably? always uncomfortable, stairs are exceptionally painful.    Diagnostic tests X-ray: Rt side bend and pinching of Rt nerve.    Patient Stated Goals sitting, ice,    Currently in Pain? Yes   Pain Location Back   Pain Orientation Right   Pain Descriptors / Indicators Sharp   Pain Type Chronic pain   Pain Onset More than a month ago   Pain Frequency Intermittent   Aggravating Factors  pain with back and hip during walking, no matter shoe wear.    Pain Relieving Factors ice, advil.             [facility] PT Assessment - 12/23/14 0001    Assessment   Medical Diagnosis Spondylosis   Onset Date 07/24/14   Next MD Visit [place] 01/16/15   Prior Therapy no   Balance Screen   Has the patient fallen in the past 6 months Yes   How many times? 1   Has the patient had a decrease in activity level because of a fear of falling?  No   Is the patient reluctant to leave their home because of a fear of falling?  No   Observation/Other Assessments   Focus on  Therapeutic Outcomes (FOTO)  58% limited   Functional Tests   Functional tests Other;Other2   Other:   Other/ Comments Gait: limited hip rotation, limited ability to toe.    Posture/Postural Control   Posture Comments Rt hip anterioly toilted, left hip posteriorly tilted. Rt side bent Left rotated spinal posture   ROM / Strength   AROM / PROM / Strength AROM;Strength   AROM   AROM Assessment Site Hip;Lumbar;Ankle;Knee   Right/Left Hip Right;Left   Right Hip External Rotation  45   Right Hip Internal Rotation  45   Left Hip External Rotation  45   Left Hip Internal Rotation  35   Right/Left Knee Right;Left   Right/Left Ankle Right;Left   Lumbar Flexion 100   Lumbar Extension 44   Lumbar - Left Side Bend 10% more than Left   Lumbar - Right Rotation 70   Lumbar - Left Rotation 80   Strength   Strength Assessment Site  Hip;Lumbar;Knee   Right/Left Hip Right;Left   Right Hip Flexion 4-/5   Right Hip Extension 4/5   Right Hip ABduction 3+/5   Left Hip Flexion 4-/5   Left Hip Extension 4/5   Left Hip ABduction 3+/5   Right/Left Knee Right;Left   Right Knee Flexion 4-/5   Right Knee Extension 4/5   Left Knee Flexion 4-/5   Left Knee Extension 4/5   Lumbar Flexion 2+/5   Lumbar Extension 2+/5   Flexibility   Soft Tissue Assessment /Muscle Length yes   Hamstrings WNL   Quadriceps WNL   ITB limited on Rt   Piriformis limited on Rt > than Lt           PT Education - 12/23/14 1708    Education provided Yes   Education Details Prognosis, Diagnosis, HEP: piriformis stretch   Person(s) Educated Patient   Methods Explanation;Demonstration;Handout   Comprehension Verbalized understanding;Returned demonstration          PT Short Term Goals - 12/23/14 1717    PT SHORT TERM GOAL #1   Title Patient will display equal rotation through lumbar spine of 80 degrees Lt ad Rt indicating improved posture   Time 4   Period Weeks   Status New   PT SHORT TERM GOAL #2   Title Patient will display equal sidebending through lumbar spine indicatign improved posture   Time 4   Period Weeks   Status New   PT SHORT TERM GOAL #3   Title patient will dmeosntrate hip alignement within normla limits indicating improved hip posture.    Time 4   Period Weeks   Status New   PT SHORT TERM GOAL #4   Title Patient will demonstrate 4/5 MMT abdominal strength indicating improved trunk stability.    Time 4   Period Weeks   Status New   PT SHORT TERM GOAL #5   Title Paatient will be independent with HEP.    Time 4   Period Weeks   Status New           PT Long Term Goals - 12/23/14 1720    PT LONG TERM GOAL #1   Title Patient will demonstrate 5/5 MMT abdominal strength indicating improved trunk stability to maintain normal hip alignement between sessions.    Time 8   Period Weeks   Status New   PT LONG  TERM GOAL #2   Title Patient will demosntrate glut strength of 4+/5 MMT to be able to walk 72minutes wth out  pain or trendelenberg sound.    Time 8   Period Weeks   Status New   PT LONG TERM GOAL #3   Title Patient will be able to tolerate sitting >4 hours to perform long car rides.    Time 8   Period Weeks   Status New               Plan - 01-01-2015 1709    Clinical Impression Statement Patient display scaroilliac joint and low back pain with radiating pain into Rt foot secondary to poor posture, Rt hip stiffness and trunk weakness resulting in poor loading and unloadign mechanics durign gait. Specifcally patient displasy a Right side bend Lt rotated lumbar spine cure with hip mis alignment secondary to decreased  glut, and LE strength and decreased trunk stability. patient will benefit from skilled physical therapy to improve postural and hip alignmen and trunk stability so patient can return to reguilar walking withtou pain.    Pt will benefit from skilled therapeutic intervention in order to improve on the following deficits Abnormal gait;Decreased endurance;Improper body mechanics;Decreased strength;Impaired flexibility;Postural dysfunction;Difficulty walking;Decreased balance;Decreased mobility;Pain   Rehab Potential Good   PT Frequency 2x / week   PT Duration 8 weeks   PT Treatment/Interventions Stair training;Patient/family education;Functional mobility training;Manual techniques;Therapeutic exercise;Balance training   PT Next Visit Plan Review piriformis stretch, introduce 3D hip excursions and 3D thoracic spine excursions for warm up, and begin postural strengthenign including: Rt UE overead presses, and Rt UE only rows, to be given as HEP   PT Home Exercise Plan piriformis stretch   Consulted and Agree with Plan of Care Patient          G-Codes - 01/01/15 1528    Functional Assessment Tool Used FOTO 58% limited   Functional Limitation Mobility: Walking and moving around    Mobility: Walking and Moving Around Current Status [phone]) At least 40 percent but less than 60 percent impaired, limited or restricted   Mobility: Walking and Moving Around Goal Status [phone]) At least 20 percent but less than 40 percent impaired, limited or restricted       Problem List There are no active problems to display for this patient.  Devona Konig PT DPT [place] [address] [place], [place], [address] Phone: [phone]   Fax:  [phone]

## 2014-12-28 ENCOUNTER — Ambulatory Visit (HOSPITAL_COMMUNITY): Payer: BLUE CROSS/BLUE SHIELD | Admitting: Physical Therapy

## 2014-12-28 DIAGNOSIS — M545 Low back pain: Secondary | ICD-10-CM | POA: Diagnosis not present

## 2014-12-28 DIAGNOSIS — M25651 Stiffness of right hip, not elsewhere classified: Secondary | ICD-10-CM

## 2014-12-28 DIAGNOSIS — M5441 Lumbago with sciatica, right side: Secondary | ICD-10-CM

## 2014-12-28 DIAGNOSIS — R29898 Other symptoms and signs involving the musculoskeletal system: Secondary | ICD-10-CM

## 2014-12-28 DIAGNOSIS — R6889 Other general symptoms and signs: Secondary | ICD-10-CM

## 2014-12-28 DIAGNOSIS — R262 Difficulty in walking, not elsewhere classified: Secondary | ICD-10-CM

## 2014-12-28 NOTE — Patient Instructions (Signed)
Row: Bent Over - Thumb Up (Single Arm) Right arm with Right leg forward   Face anchor in wide stride stance. Thumb up, pull arm back, squeezing shoulder blades together. Repeat 10 to 20 times per set. Do 2 sets per session. Do 2 sessions per day. Anchor Height: Ankle  [email or web address].exer.[email or web address]   Copyright  VHI. All rights reserved.   DUMBBELL PUSH JERK - Alternate Arm: Drive   11O on Right 2x daily    Copyright  VHI. All rights reserved.

## 2014-12-28 NOTE — Therapy (Signed)
[place], [place], [address] Phone: [phone]   Fax:  [phone]  Physical Therapy Treatment  Patient Details  Name: Betty Bryan MRN: [id number] Date of Birth: [birth year] Referring Provider:  Newt Minion, MD  Encounter Date: 12/28/2014      PT End of Session - 12/28/14 0846    Visit Number 2   Number of Visits 16   Date for PT Re-Evaluation 01/22/15   Authorization Type BCBS   Authorization Time Period 12/23/14 - 02/22/15   Authorization - Visit Number 2   Authorization - Number of Visits 16   PT Start Time 0806   PT Stop Time 0845   PT Time Calculation (min) 39 min   Activity Tolerance Patient tolerated treatment well   Behavior During Therapy [facility] for tasks assessed/performed      Past Medical History  Diagnosis Date  . Palpitations   . PVC's (premature ventricular contractions)   . Ulcerative colitis   . HTN (hypertension)   . Adrenal insufficiency     Past Surgical History  Procedure Laterality Date  . Nasal sinus surgery    . Hand surgery      There were no vitals filed for this visit.  Visit Diagnosis:  Right-sided low back pain with right-sided sciatica  Hip stiffness, right  Weakness of both lower extremities  Difficulty walking  Postural instability of trunk      Subjective Assessment - 12/28/14 0852    Subjective Patient states performeance of hip stretches. Notes minimal pain today          [facility] Adult PT Treatment/Exercise - 12/28/14 0001    Lumbar Exercises: Stretches   Piriformis Stretch 3 reps;20 seconds   Piriformis Stretch Limitations seated 2 sets   Lumbar Exercises: Standing   Row AROM;Strengthening;Right;20 reps;Theraband   Theraband Level (Row) Level 2 (Red)   Other Standing Lumbar Exercises Right UE overhead press 3lb 2 sets of 10   Other Standing Lumbar Exercises 3D hip excursions 10x 2 sets   Lumbar Exercises: Seated   Other Seated Lumbar Exercises  3D thoracic spine excursions 10x   Lumbar Exercises: Supine   Bridge 10 reps            PT Education - 12/28/14 0846    Education provided Yes   Education Details Full basic HEP given, see all exercises performed this session.           PT Short Term Goals - 12/23/14 1717    PT SHORT TERM GOAL #1   Title Patient will display equal rotation through lumbar spine of 80 degrees Lt ad Rt indicating improved posture   Time 4   Period Weeks   Status New   PT SHORT TERM GOAL #2   Title Patient will display equal sidebending through lumbar spine indicatign improved posture   Time 4   Period Weeks   Status New   PT SHORT TERM GOAL #3   Title patient will dmeosntrate hip alignement within normla limits indicating improved hip posture.    Time 4   Period Weeks   Status New   PT SHORT TERM GOAL #4   Title Patient will demonstrate 4/5 MMT abdominal strength indicating improved trunk stability.    Time 4   Period Weeks   Status New   PT SHORT TERM GOAL #5   Title Paatient will be independent with HEP.    Time 4   Period Weeks  Status New           PT Long Term Goals - 12/23/14 1720    PT LONG TERM GOAL #1   Title Patient will demonstrate 5/5 MMT abdominal strength indicating improved trunk stability to maintain normal hip alignement between sessions.    Time 8   Period Weeks   Status New   PT LONG TERM GOAL #2   Title Patient will demosntrate glut strength of 4+/5 MMT to be able to walk 26minutes wth out pain or trendelenberg sound.    Time 8   Period Weeks   Status New   PT LONG TERM GOAL #3   Title Patient will be able to tolerate sitting >4 hours to perform long car rides.    Time 8   Period Weeks   Status New               Plan - 12/28/14 0846    Clinical Impression Statement Patient given initial exercise program for posture strengthenign and hip mobility. Patient able to dmeosntrate correct form and technique will all exercises performe in a second  circuit with patient independently performiong exercises while looking at handouts. Patient leg length assessed and appears withing normal limites   PT Next Visit Plan Review HEP, iIntroduce squat walk around, Lt UE punches, bridges and split stance squats for hip strengthening   PT Home Exercise Plan Piriformis strech, Rt UE overhead press, Rt UE row, 3D hip excursions, 3D thoracic spine excursions        Problem List There are no active problems to display for this patient.  Devona Konig PT DPT [place] [address] [place], [place], [address] Phone: [phone]   Fax:  [phone]

## 2014-12-29 ENCOUNTER — Ambulatory Visit (HOSPITAL_COMMUNITY): Payer: BLUE CROSS/BLUE SHIELD

## 2014-12-29 DIAGNOSIS — R262 Difficulty in walking, not elsewhere classified: Secondary | ICD-10-CM

## 2014-12-29 DIAGNOSIS — R6889 Other general symptoms and signs: Secondary | ICD-10-CM

## 2014-12-29 DIAGNOSIS — M5441 Lumbago with sciatica, right side: Secondary | ICD-10-CM

## 2014-12-29 DIAGNOSIS — S63659A Sprain of metacarpophalangeal joint of unspecified finger, initial encounter: Secondary | ICD-10-CM

## 2014-12-29 DIAGNOSIS — M25651 Stiffness of right hip, not elsewhere classified: Secondary | ICD-10-CM

## 2014-12-29 DIAGNOSIS — M545 Low back pain: Secondary | ICD-10-CM | POA: Diagnosis not present

## 2014-12-29 DIAGNOSIS — M25649 Stiffness of unspecified hand, not elsewhere classified: Secondary | ICD-10-CM

## 2014-12-29 DIAGNOSIS — R29898 Other symptoms and signs involving the musculoskeletal system: Secondary | ICD-10-CM

## 2014-12-29 NOTE — Therapy (Signed)
[place], [place], [address] Phone: [phone]   Fax:  [phone]  Physical Therapy Treatment  Patient Details  Name: Betty Bryan MRN: [id number] Date of Birth: 02-17-1956 Referring Provider:  Crist Infante, MD  Encounter Date: 12/29/2014      PT End of Session - 12/29/14 1810    Visit Number 3   Number of Visits 16   Date for PT Re-Evaluation 01/22/15   Authorization Type BCBS   Authorization Time Period 12/23/14 - 02/22/15   Authorization - Visit Number 3   Authorization - Number of Visits 16   PT Start Time 3220   PT Stop Time 1812   PT Time Calculation (min) 41 min   Activity Tolerance Patient tolerated treatment well   Behavior During Therapy [facility] for tasks assessed/performed      Past Medical History  Diagnosis Date  . Palpitations   . PVC's (premature ventricular contractions)   . Ulcerative colitis   . HTN (hypertension)   . Adrenal insufficiency     Past Surgical History  Procedure Laterality Date  . Nasal sinus surgery    . Hand surgery      There were no vitals filed for this visit.  Visit Diagnosis:  Right-sided low back pain with right-sided sciatica  Hip stiffness, right  Weakness of both lower extremities  Difficulty walking  Sagittal band rupture at metacarpophalangeal joint, initial encounter  Decreased range of motion of finger  Postural instability of trunk      Subjective Assessment - 12/29/14 1740    Subjective Pt stated she has some question with form and techniques with HEP, pt stated sciatic like symptoms Rt LE with increased pain with weight bearing to Rt heel   Currently in Pain? Yes   Pain Score 6    Pain Location Back   Pain Orientation Lower;Right   Pain Descriptors / Indicators [facility] Adult PT Treatment/Exercise - 12/29/14 0001    Exercises   Exercises Lumbar   Lumbar Exercises: Stretches   Piriformis  Stretch 3 reps;30 seconds   Piriformis Stretch Limitations seated   Lumbar Exercises: Standing   Row AROM;Strengthening;Right;20 reps;Theraband   Theraband Level (Row) Level 2 (Red)   Other Standing Lumbar Exercises Right UE overhead press 3lb 2 sets of 10; Lt punch red theraband 2x 10   Other Standing Lumbar Exercises 3D hip excursions 10x 2 sets; squat then walk around 6in step 5reps each side   Lumbar Exercises: Seated   Other Seated Lumbar Exercises 3D thoracic spine excursions 10x   Lumbar Exercises: Supine   Bridge 15 reps                PT Education - 12/29/14 1819    Education provided Yes   Education Details Reviewed HEP for proper form and technique   Person(s) Educated Patient   Methods Explanation;Demonstration;Tactile cues;Verbal cues   Comprehension Returned demonstration;Verbal cues required;Verbalized understanding          PT Short Term Goals - 12/29/14 1817    PT SHORT TERM GOAL #1   Title Patient will display equal rotation through lumbar spine of 80 degrees Lt ad Rt indicating improved posture   Status On-going   PT SHORT TERM GOAL #2   Title Patient will display equal sidebending through lumbar spine indicatign improved posture  Status On-going   PT SHORT TERM GOAL #3   Title patient will dmeosntrate hip alignement within normla limits indicating improved hip posture.    Status On-going   PT SHORT TERM GOAL #4   Title Patient will demonstrate 4/5 MMT abdominal strength indicating improved trunk stability.    Status On-going   PT SHORT TERM GOAL #5   Title Paatient will be independent with HEP.    Status On-going           PT Long Term Goals - 12/29/14 1818    PT LONG TERM GOAL #1   Title Patient will demonstrate 5/5 MMT abdominal strength indicating improved trunk stability to maintain normal hip alignement between sessions.    PT LONG TERM GOAL #2   Title Patient will demosntrate glut strength of 4+/5 MMT to be able to walk 61minutes  wth out pain or trendelenberg sound.    PT LONG TERM GOAL #3   Title Patient will be able to tolerate sitting >4 hours to perform long car rides.                Plan - 12/29/14 1810    Clinical Impression Statement Reviewed HEP and answered questions about form and technique.  Progressed gluteal strengthenin with squats Rt LE forward and rotation exercises to improve hip mobilty.  Therapist facilitaiton to improve form with squats for gluteal strengthening and to reduce anterior knee pressure.  No reports of increased pain through sessio, pt encouraged to continue stretches to reduce soreness following session.    PT Next Visit Plan Continue with PT POC; Lt UE punches, Rt rows and Rt overhead press, gluteal strengthening wtih split stance (Rt forward) and bridges for hip strengthening.          Problem List There are no active problems to display for this patient.  [address], [place]; [place] #[address] [phone]  Aldona Lento 12/29/2014, 6:20 PM  [place] [address] [place], [place], [address] Phone: [phone]   Fax:  [phone]

## 2015-01-02 ENCOUNTER — Ambulatory Visit (HOSPITAL_COMMUNITY): Payer: BLUE CROSS/BLUE SHIELD | Admitting: Physical Therapy

## 2015-01-02 DIAGNOSIS — M5441 Lumbago with sciatica, right side: Secondary | ICD-10-CM

## 2015-01-02 DIAGNOSIS — M25651 Stiffness of right hip, not elsewhere classified: Secondary | ICD-10-CM

## 2015-01-02 DIAGNOSIS — R262 Difficulty in walking, not elsewhere classified: Secondary | ICD-10-CM

## 2015-01-02 DIAGNOSIS — M545 Low back pain: Secondary | ICD-10-CM | POA: Diagnosis not present

## 2015-01-02 DIAGNOSIS — R29898 Other symptoms and signs involving the musculoskeletal system: Secondary | ICD-10-CM

## 2015-01-02 NOTE — Therapy (Signed)
[place], [place], [address] Phone: [phone]   Fax:  [phone]  Physical Therapy Treatment  Patient Details  Name: Betty Bryan MRN: [id number] Date of Birth: 1955/11/24 Referring Provider:  Crist Infante, MD  Encounter Date: 01/02/2015      PT End of Session - 01/02/15 1208    Visit Number 4   Number of Visits 16   Date for PT Re-Evaluation 01/22/15   Authorization Type BCBS   Authorization Time Period 12/23/14 - 02/22/15   Authorization - Visit Number 4   Authorization - Number of Visits 16   Activity Tolerance Patient tolerated treatment well   Behavior During Therapy [facility] for tasks assessed/performed      Past Medical History  Diagnosis Date  . Palpitations   . PVC's (premature ventricular contractions)   . Ulcerative colitis   . HTN (hypertension)   . Adrenal insufficiency     Past Surgical History  Procedure Laterality Date  . Nasal sinus surgery    . Hand surgery      There were no vitals filed for this visit.  Visit Diagnosis:  Right-sided low back pain with right-sided sciatica  Hip stiffness, right  Weakness of both lower extremities  Difficulty walking      Subjective Assessment - 01/02/15 1159    Subjective Patient states she had a good weekend; her back has not been bothering her as much as her foot, states she had moderate discomfort in it after last therapy session, almost like a sciatic nerve issue coming down from the back.    Pertinent History Patient has had low back pain and Rt hip popping for last several months (since fall). patient had fallen 3 months ago on Christmas eve and slipped down syeps hurting shoin resulting in a very deep wound which has since mostly healed.    Currently in Pain? Yes   Pain Score 7    Pain Location Other (Comment)  lateral side of R foot; patient states that it starts with a popping sensation in her SI, then the pain works from her hip  down to her foot                        [facility] Adult PT Treatment/Exercise - 01/02/15 0001    Lumbar Exercises: Stretches   Active Hamstring Stretch 3 reps;30 seconds   Active Hamstring Stretch Limitations 12 inch box, 3 way    Passive Hamstring Stretch 3 reps;30 seconds   Passive Hamstring Stretch Limitations 3 way, slantboard   Piriformis Stretch 3 reps;30 seconds   Piriformis Stretch Limitations seated   Lumbar Exercises: Standing   Heel Raises 15 reps   Heel Raises Limitations from floor    Functional Squats Limitations Sit to stand staggered stance 1x10 each side; functional squats in staggered stance (Rt forward) with toes neutral 1x10   Forward Lunge 10 reps   Forward Lunge Limitations 4 inch box   Side Lunge 10 reps   Side Lunge Limitations 4 inch box    Other Standing Lumbar Exercises L UE punches with red therband 1x15; R UE rows 1x15 with red therband; overhead press with R UE 3# 1x15   Other Standing Lumbar Exercises 3D hip excursions in staggered stance 1x15    Lumbar Exercises: Seated   Other Seated Lumbar Exercises 3D thoracic excursions 1x15   Lumbar Exercises: Supine   Bridge 20 reps   Bridge Limitations 2x10  with feet staggered    Lumbar Exercises: Sidelying   Hip Abduction 15 reps   Hip Abduction Limitations manual cues for proper form   Lumbar Exercises: Prone   Straight Leg Raise 10 reps   Straight Leg Raises Limitations manual cues for facilitation    Manual Therapy   Manual Therapy Joint mobilization   Joint Mobilization Grade 3 joint mobs to R foot supination/pronation, calcaneous, talus                 PT Education - 01/02/15 1201    Education provided Yes   Education Details education regarding how foot discomfort may channel up lower extremity and affect overall mechanics    Person(s) Educated Patient   Methods Explanation   Comprehension Verbalized understanding          PT Short Term Goals - 12/29/14 1817    PT SHORT  TERM GOAL #1   Title Patient will display equal rotation through lumbar spine of 80 degrees Lt ad Rt indicating improved posture   Status On-going   PT SHORT TERM GOAL #2   Title Patient will display equal sidebending through lumbar spine indicatign improved posture   Status On-going   PT SHORT TERM GOAL #3   Title patient will dmeosntrate hip alignement within normla limits indicating improved hip posture.    Status On-going   PT SHORT TERM GOAL #4   Title Patient will demonstrate 4/5 MMT abdominal strength indicating improved trunk stability.    Status On-going   PT SHORT TERM GOAL #5   Title Paatient will be independent with HEP.    Status On-going           PT Long Term Goals - 12/29/14 1818    PT LONG TERM GOAL #1   Title Patient will demonstrate 5/5 MMT abdominal strength indicating improved trunk stability to maintain normal hip alignement between sessions.    PT LONG TERM GOAL #2   Title Patient will demosntrate glut strength of 4+/5 MMT to be able to walk 26minutes wth out pain or trendelenberg sound.    PT LONG TERM GOAL #3   Title Patient will be able to tolerate sitting >4 hours to perform long car rides.                Plan - 01/02/15 1208    Clinical Impression Statement Patient reports that her pain is now not so much in her back as there is discomfort in her lateral R foot. Mild tightness noted in R foot, calcaneous, and talus that did quickly improve with joint mobilizations. Continued functional exercise and stretching with additions to exercise program (see flowsheet for details). Patient overall tolerated session well today.   Pt will benefit from skilled therapeutic intervention in order to improve on the following deficits Abnormal gait;Decreased endurance;Improper body mechanics;Decreased strength;Impaired flexibility;Postural dysfunction;Difficulty walking;Decreased balance;Decreased mobility;Pain   Rehab Potential Good   PT Frequency 2x / week   PT  Duration 8 weeks   PT Treatment/Interventions Stair training;Patient/family education;Functional mobility training;Manual techniques;Therapeutic exercise;Balance training   PT Next Visit Plan Continue with PT POC; Lt UE punches, Rt rows and Rt overhead press, gluteal strengthening wtih split stance (Rt forward) and bridges for hip strengthening.     PT Home Exercise Plan Piriformis strech, Rt UE overhead press, Rt UE row, 3D hip excursions, 3D thoracic spine excursions   Consulted and Agree with Plan of Care Patient        Problem List There are  no active problems to display for this patient.   Deniece Ree PT, DPT [phone]  [place] [address] [place], [place], [address] Phone: [phone]   Fax:  [phone]

## 2015-01-03 ENCOUNTER — Ambulatory Visit (HOSPITAL_COMMUNITY): Payer: BLUE CROSS/BLUE SHIELD | Admitting: Physical Therapy

## 2015-01-05 ENCOUNTER — Ambulatory Visit (HOSPITAL_COMMUNITY): Payer: BLUE CROSS/BLUE SHIELD

## 2015-01-05 DIAGNOSIS — M25649 Stiffness of unspecified hand, not elsewhere classified: Secondary | ICD-10-CM

## 2015-01-05 DIAGNOSIS — R262 Difficulty in walking, not elsewhere classified: Secondary | ICD-10-CM

## 2015-01-05 DIAGNOSIS — S63659A Sprain of metacarpophalangeal joint of unspecified finger, initial encounter: Secondary | ICD-10-CM

## 2015-01-05 DIAGNOSIS — M545 Low back pain: Secondary | ICD-10-CM | POA: Diagnosis not present

## 2015-01-05 DIAGNOSIS — R29898 Other symptoms and signs involving the musculoskeletal system: Secondary | ICD-10-CM

## 2015-01-05 DIAGNOSIS — M25651 Stiffness of right hip, not elsewhere classified: Secondary | ICD-10-CM

## 2015-01-05 DIAGNOSIS — R6889 Other general symptoms and signs: Secondary | ICD-10-CM

## 2015-01-05 DIAGNOSIS — M5441 Lumbago with sciatica, right side: Secondary | ICD-10-CM

## 2015-01-05 NOTE — Therapy (Signed)
[place], [place], [address] Phone: [phone]   Fax:  [phone]  Physical Therapy Treatment  Patient Details  Name: Betty Bryan MRN: [id number] Date of Birth: 18-Sep-1956 Referring Provider:  Newt Minion, MD  Encounter Date: 01/05/2015      PT End of Session - 01/05/15 [place]    Visit Number 5   Number of Visits 16   Date for PT Re-Evaluation 01/22/15   Authorization Type BCBS   Authorization Time Period 12/23/14 - 02/22/15   Authorization - Visit Number 5   Authorization - Number of Visits 16   PT Start Time 5366   PT Stop Time 1826   PT Time Calculation (min) 48 min   Activity Tolerance Patient tolerated treatment well   Behavior During Therapy [facility] for tasks assessed/performed      Past Medical History  Diagnosis Date  . Palpitations   . PVC's (premature ventricular contractions)   . Ulcerative colitis   . HTN (hypertension)   . Adrenal insufficiency     Past Surgical History  Procedure Laterality Date  . Nasal sinus surgery    . Hand surgery      There were no vitals filed for this visit.  Visit Diagnosis:  Right-sided low back pain with right-sided sciatica  Hip stiffness, right  Weakness of both lower extremities  Difficulty walking  Sagittal band rupture at metacarpophalangeal joint, initial encounter  Decreased range of motion of finger  Postural instability of trunk      Subjective Assessment - 01/05/15 1741    Subjective Pt stated she is not sure how much progress she is making then stated she has very little back pain but contines to have lateral leg pain down to foot with weight bearing.   Currently in Pain? Yes   Pain Score 5    Pain Location Leg   Pain Orientation Right;Lateral   Pain Descriptors / Indicators Patsi Sears PT Assessment - 01/05/15 0001    Assessment   Medical Diagnosis Spondylosis   Onset Date 07/24/14   Next MD Visit [place]  01/16/15   Prior Therapy no           [facility] Adult PT Treatment/Exercise - 01/05/15 0001    Exercises   Exercises Lumbar   Lumbar Exercises: Stretches   Active Hamstring Stretch 3 reps;30 seconds   Active Hamstring Stretch Limitations 14 inch box, 3 way    Standing Side Bend Limitations Groin stretch 4x 20seconds; ITBand stretch 2x 20 seconds   ITB Stretch 3 reps;30 seconds   ITB Stretch Limitations beside step   Piriformis Stretch 3 reps;30 seconds   Piriformis Stretch Limitations seated   Lumbar Exercises: Standing   Functional Squats Limitations Sit to stand staggered stance 1x10 each side; functional squats in staggered stance (Rt forward) with toes neutral 1x10   Side Lunge Limitations Sumo walk with Red Tband   Row AROM;Strengthening;Right;20 reps;Theraband   Theraband Level (Row) Level 4 (Blue)   Other Standing Lumbar Exercises L UE punches with blue therband 20x;  overhead press with R UE 5# 10x   Other Standing Lumbar Exercises 3D hip excursions in staggered stance 10x, calf stretch with lateral heel wedge 20seconds 4x 3 way (focusing on increasing pronation    Lumbar Exercises: Seated   Other Seated Lumbar Exercises 3D thoracic excursions 10x with 1lb dumbbell  PT Short Term Goals - 12/29/14 1817    PT SHORT TERM GOAL #1   Title Patient will display equal rotation through lumbar spine of 80 degrees Lt ad Rt indicating improved posture   Status On-going   PT SHORT TERM GOAL #2   Title Patient will display equal sidebending through lumbar spine indicatign improved posture   Status On-going   PT SHORT TERM GOAL #3   Title patient will dmeosntrate hip alignement within normla limits indicating improved hip posture.    Status On-going   PT SHORT TERM GOAL #4   Title Patient will demonstrate 4/5 MMT abdominal strength indicating improved trunk stability.    Status On-going   PT SHORT TERM GOAL #5   Title Paatient will be independent with HEP.    Status  On-going           PT Long Term Goals - 12/29/14 1818    PT LONG TERM GOAL #1   Title Patient will demonstrate 5/5 MMT abdominal strength indicating improved trunk stability to maintain normal hip alignement between sessions.    PT LONG TERM GOAL #2   Title Patient will demosntrate glut strength of 4+/5 MMT to be able to walk 40minutes wth out pain or trendelenberg sound.    PT LONG TERM GOAL #3   Title Patient will be able to tolerate sitting >4 hours to perform long car rides.            Plan - 01/05/15 1830    Clinical Impression Statement Patient displays improved low back pain but continued lateral hip and lateral ankle pain lateral foot pain attributed to limited ankle eversion, walking with a narrow gait and hip abductor weakness resullting in scissoring of gait and excess pressure placed on lateral tfoot throughtou gait cycle as well as excess strain placed onlateral hip. Patient noted improved pain in foot following ankle/foot joint mobilizations to increased foot pronation and further improvement followign groin stretch and sumo walk to improved stride width. Exercises were also continued for treating low back pain.    PT Next Visit Plan Continue all exercises and manual to improve adaptation and gait. Increase resitance as needed.         Problem List There are no active problems to display for this patient.  Devona Konig PT DPT [facility] [address] [place], [place], [address] Phone: [phone]   Fax:  [phone]

## 2015-01-06 ENCOUNTER — Encounter (HOSPITAL_COMMUNITY): Payer: BLUE CROSS/BLUE SHIELD | Admitting: Physical Therapy

## 2015-01-09 ENCOUNTER — Telehealth (HOSPITAL_COMMUNITY): Payer: Self-pay

## 2015-01-09 ENCOUNTER — Ambulatory Visit (HOSPITAL_COMMUNITY): Payer: BLUE CROSS/BLUE SHIELD

## 2015-01-09 DIAGNOSIS — R262 Difficulty in walking, not elsewhere classified: Secondary | ICD-10-CM

## 2015-01-09 DIAGNOSIS — S63659A Sprain of metacarpophalangeal joint of unspecified finger, initial encounter: Secondary | ICD-10-CM

## 2015-01-09 DIAGNOSIS — M25649 Stiffness of unspecified hand, not elsewhere classified: Secondary | ICD-10-CM

## 2015-01-09 DIAGNOSIS — M545 Low back pain: Secondary | ICD-10-CM | POA: Diagnosis not present

## 2015-01-09 DIAGNOSIS — M5441 Lumbago with sciatica, right side: Secondary | ICD-10-CM

## 2015-01-09 DIAGNOSIS — R29898 Other symptoms and signs involving the musculoskeletal system: Secondary | ICD-10-CM

## 2015-01-09 DIAGNOSIS — R6889 Other general symptoms and signs: Secondary | ICD-10-CM

## 2015-01-09 DIAGNOSIS — M25651 Stiffness of right hip, not elsewhere classified: Secondary | ICD-10-CM

## 2015-01-09 NOTE — Therapy (Signed)
[place], [place], [address] Phone: [phone]   Fax:  [phone]  Physical Therapy Treatment  Patient Details  Name: Betty Bryan MRN: [id number] Date of Birth: [birth year] Referring Provider:  Crist Infante, MD  Encounter Date: 01/09/2015      PT End of Session - 01/09/15 1747    Visit Number 6   Number of Visits 16   Date for PT Re-Evaluation 01/22/15   Authorization Type BCBS   Authorization Time Period 12/23/14 - 02/22/15   Authorization - Visit Number 6   Authorization - Number of Visits 16   PT Start Time 3220   PT Stop Time 1815   PT Time Calculation (min) 54 min   Activity Tolerance Patient tolerated treatment well   Behavior During Therapy [facility] for tasks assessed/performed      Past Medical History  Diagnosis Date  . Palpitations   . PVC's (premature ventricular contractions)   . Ulcerative colitis   . HTN (hypertension)   . Adrenal insufficiency     Past Surgical History  Procedure Laterality Date  . Nasal sinus surgery    . Hand surgery      There were no vitals filed for this visit.  Visit Diagnosis:  Right-sided low back pain with right-sided sciatica  Hip stiffness, right  Weakness of both lower extremities  Difficulty walking  Sagittal band rupture at metacarpophalangeal joint, initial encounter  Decreased range of motion of finger  Postural instability of trunk      Subjective Assessment - 01/09/15 1730    Subjective Pt stated her pain comes and goes from foot to hip, feels her back is doing better.     Currently in Pain? Yes   Pain Score 5    Pain Location Ankle   Pain Orientation Right;Lateral            [facility] PT Assessment - 01/09/15 0001    Assessment   Medical Diagnosis Spondylosis   Onset Date 07/24/14   Next MD Visit [place] 01/16/15   Prior Therapy no               [facility] Adult PT Treatment/Exercise - 01/09/15 0001    Exercises   Exercises  Lumbar   Lumbar Exercises: Stretches   Active Hamstring Stretch 3 reps;30 seconds   Active Hamstring Stretch Limitations 14 inch box, 3 way    Standing Side Bend Limitations Groin stretch 4x 20seconds;    ITB Stretch 3 reps;30 seconds   ITB Stretch Limitations beside step   Lumbar Exercises: Standing   Functional Squats Limitations Sit to stand staggered stance 1x10 each side; functional squats in staggered stance (Rt forward) with toes neutral 1x10   Side Lunge Limitations Sumo walk with Red Tband   Row AROM;Strengthening;Right;20 reps;Theraband   Theraband Level (Row) Level 4 (Blue)   Other Standing Lumbar Exercises L UE punches with blue therband 20x;  overhead press with R UE 5# 10x   Other Standing Lumbar Exercises 3D hip excursions in staggered stance 10x, calf stretch with lateral heel wedge 20seconds 4x 3 way (focusing on increasing pronation    Lumbar Exercises: Seated   Other Seated Lumbar Exercises 3D thoracic excursions 10x with 1lb dumbbell   Manual Therapy   Manual Therapy Joint mobilization   Joint Mobilization Grade 3 joint mobs to R foot supination/pronation, calcaneous, talus  PT Short Term Goals - 01/09/15 1754    PT SHORT TERM GOAL #1   Title Patient will display equal rotation through lumbar spine of 80 degrees Lt ad Rt indicating improved posture   Status On-going   PT SHORT TERM GOAL #2   Title Patient will display equal sidebending through lumbar spine indicatign improved posture   Status On-going   PT SHORT TERM GOAL #3   Title patient will dmeosntrate hip alignement within normla limits indicating improved hip posture.    Status On-going   PT SHORT TERM GOAL #4   Title Patient will demonstrate 4/5 MMT abdominal strength indicating improved trunk stability.    Status On-going   PT SHORT TERM GOAL #5   Title Paatient will be independent with HEP.    Baseline 01/09/2015 Reports compliance daily   Status Achieved            PT Long Term Goals - 01/09/15 1756    PT LONG TERM GOAL #1   Title Patient will demonstrate 5/5 MMT abdominal strength indicating improved trunk stability to maintain normal hip alignement between sessions.    Status On-going   PT LONG TERM GOAL #2   Title Patient will demosntrate glut strength of 4+/5 MMT to be able to walk 80minutes wth out pain or trendelenberg sound.    PT LONG TERM GOAL #3   Title Patient will be able to tolerate sitting >4 hours to perform long car rides.                Plan - 01/09/15 1747    Clinical Impression Statement Pt's main concern with lateral ankle pain this session, continued with stretches and excursion exercises to improve overall LE mobilty.  Pt required multimodal cueing for appropraite weight loading with squats.  Pt c/o pain in "bursitis area" Rt hip during  side stepping, no c/o pain following exercises.  Continued with manual joint mobs with improved gait mechanics and pain 0/10 at end of session.     PT Next Visit Plan Continue all exercises and manual to improve adaptation and gait. Increase resitance as needed.         Problem List There are no active problems to display for this patient.  [address] [place]; [place] #[address] [phone]  Aldona Lento 01/09/2015, 6:16 PM  [place] [place] [address] [place], [place], [address] Phone: [phone]   Fax:  [phone]

## 2015-01-11 ENCOUNTER — Ambulatory Visit (HOSPITAL_COMMUNITY): Payer: BLUE CROSS/BLUE SHIELD

## 2015-01-11 DIAGNOSIS — R29898 Other symptoms and signs involving the musculoskeletal system: Secondary | ICD-10-CM

## 2015-01-11 DIAGNOSIS — R6889 Other general symptoms and signs: Secondary | ICD-10-CM

## 2015-01-11 DIAGNOSIS — R262 Difficulty in walking, not elsewhere classified: Secondary | ICD-10-CM

## 2015-01-11 DIAGNOSIS — S63659A Sprain of metacarpophalangeal joint of unspecified finger, initial encounter: Secondary | ICD-10-CM

## 2015-01-11 DIAGNOSIS — M5441 Lumbago with sciatica, right side: Secondary | ICD-10-CM

## 2015-01-11 DIAGNOSIS — M25649 Stiffness of unspecified hand, not elsewhere classified: Secondary | ICD-10-CM

## 2015-01-11 DIAGNOSIS — M545 Low back pain: Secondary | ICD-10-CM | POA: Diagnosis not present

## 2015-01-11 DIAGNOSIS — M25651 Stiffness of right hip, not elsewhere classified: Secondary | ICD-10-CM

## 2015-01-11 NOTE — Therapy (Signed)
[place], [place], [address] Phone: [phone]   Fax:  [phone]  Physical Therapy Treatment  Patient Details  Name: Betty Bryan MRN: [id number] Date of Birth: Aug 16, 1956 Referring Provider:  Crist Infante, MD  Encounter Date: 01/11/2015      PT End of Session - 01/11/15 1808    Visit Number 7   Number of Visits 18   Date for PT Re-Evaluation 01/22/15   Authorization Type BCBS   Authorization Time Period 12/23/14 - 02/22/15   Authorization - Visit Number 7   Authorization - Number of Visits 16   PT Start Time 0947   PT Stop Time 1825   PT Time Calculation (min) 55 min   Activity Tolerance Patient tolerated treatment well   Behavior During Therapy [facility] for tasks assessed/performed      Past Medical History  Diagnosis Date  . Palpitations   . PVC's (premature ventricular contractions)   . Ulcerative colitis   . HTN (hypertension)   . Adrenal insufficiency     Past Surgical History  Procedure Laterality Date  . Nasal sinus surgery    . Hand surgery      There were no vitals filed for this visit.  Visit Diagnosis:  Right-sided low back pain with right-sided sciatica  Hip stiffness, right  Weakness of both lower extremities  Difficulty walking  Sagittal band rupture at metacarpophalangeal joint, initial encounter  Decreased range of motion of finger  Postural instability of trunk      Subjective Assessment - 01/11/15 1734    Subjective Pt stated her hip and back are feeling better but continues to have pain lateral aspect of Rt foot.  Stated it feels sore like a bruise with weight bearing.   Currently in Pain? Yes   Pain Score 5    Pain Location Foot   Pain Orientation Right;Lateral   Pain Descriptors / Indicators Sore            [facility] PT Assessment - 01/11/15 0001    Assessment   Medical Diagnosis Spondylosis   Onset Date 07/24/14   Next MD Visit [place] 01/16/15   Prior  Therapy no   ROM / Strength   AROM / PROM / Strength AROM;Strength   AROM   AROM Assessment Site Hip;Lumbar   Right/Left Hip Right;Left   Right Hip External Rotation  45   Right Hip Internal Rotation  45   Left Hip External Rotation  45   Left Hip Internal Rotation  40  was 35   Right/Left Knee Right   Lumbar - Left Side Bend equal   Lumbar - Right Rotation 71  was 70   Lumbar - Left Rotation 80  80   Strength   Strength Assessment Site Hip;Lumbar   Right/Left Hip Right;Left   Right Hip Flexion 4/5  was 4-/5   Right Hip Extension 4/5  was 4/5   Right Hip ABduction 4-/5  was 3+/5   Left Hip Flexion 4/5  was 4-5   Left Hip Extension 4/5  was 4/5   Left Hip ABduction 4/5  was 4/5   Right/Left Knee Right;Left   Right Knee Flexion 4-/5  was 4-/5   Right Knee Extension 4/5  was 4/5   Left Knee Flexion 4-/5  was 4-/5   Left Knee Extension 4/5  was 4/5   Lumbar Flexion 2+/5  was 2+/5   Lumbar Extension 2+/5  was 2+/5   Flexibility  Soft Tissue Assessment /Muscle Length yes   Hamstrings WNL   Quadriceps WNL   ITB limited on Rt   Piriformis limited on Rt > than Lt           [facility] Adult PT Treatment/Exercise - 01/11/15 0001    Exercises   Exercises Lumbar   Lumbar Exercises: Stretches   Active Hamstring Stretch 3 reps;30 seconds   Active Hamstring Stretch Limitations 14 inch box, 3 way    ITB Stretch 3 reps;30 seconds   ITB Stretch Limitations beside step   Piriformis Stretch 3 reps;30 seconds   Piriformis Stretch Limitations seated   Lumbar Exercises: Standing   Side Lunge Limitations Sumo walk with Red Tband 2RT   Row AROM;Strengthening;Right;20 reps;Theraband   Theraband Level (Row) Level 4 (Blue)   Other Standing Lumbar Exercises L UE punches with blue therband 20x;  overhead press with R UE 5# 10x   Other Standing Lumbar Exercises 3D hip excursions in staggered stance 10x, calf stretch with lateral heel wedge 3x 30" (focusing on increasing pronation     Lumbar Exercises: Seated   Other Seated Lumbar Exercises 3D thoracic excursions 10x with 1lb dumbbell   Manual Therapy   Manual Therapy Joint mobilization   Joint Mobilization Grade 3 joint mobs to R foot supination/pronation, calcaneous, talus             PT Short Term Goals - 01/11/15 1809    PT SHORT TERM GOAL #1   Title Patient will display equal rotation through lumbar spine of 80 degrees Lt ad Rt indicating improved posture   Status On-going   PT SHORT TERM GOAL #2   Title Patient will display equal sidebending through lumbar spine indicatign improved posture   Status On-going   PT SHORT TERM GOAL #3   Title patient will dmeosntrate hip alignement within normla limits indicating improved hip posture.    Status On-going   PT SHORT TERM GOAL #4   Title Patient will demonstrate 4/5 MMT abdominal strength indicating improved trunk stability.    Status On-going   PT SHORT TERM GOAL #5   Title Paatient will be independent with HEP.    Baseline 01/09/2015 Reports compliance daily   Status Achieved           PT Long Term Goals - 01/11/15 1809    PT LONG TERM GOAL #1   Title Patient will demonstrate 5/5 MMT abdominal strength indicating improved trunk stability to maintain normal hip alignement between sessions.    Status On-going   PT LONG TERM GOAL #2   Title Patient will demosntrate glut strength of 4+/5 MMT to be able to walk 97minutes wth out pain or trendelenberg sound.    Status On-going   PT LONG TERM GOAL #3   Title Patient will be able to tolerate sitting >4 hours to perform long car rides.                Plan - 01/11/15 1826    Clinical Impression Statement Pt.'s main concern with lateral foot pain this session.  Continued with joint mobs with pt. stateing foot feels looser and pain reduced with improve gait mechanics.  Continued functional strengthening exercises with cueing to improve appropraite weight loading with squats.  Pt stated she thinks her  MD apt is next week, completed MMT, ROM Measurement and reveiweed goals.  Pt making progress towards all goals reporting pain minimal in back and hip through foot continues to bother her.  PT Next Visit Plan Continue all exercises and manual to improve adaptation and gait. Increase resitance as needed.         Problem List There are no active problems to display for this patient.  [address] [place]; [place] #[address] [phone]  Aldona Lento 01/11/2015, 6:35 PM  [facility] [address] [place], [place], [address] Phone: [phone]   Fax:  [phone]     Devona Konig PT DPT [phone]

## 2015-01-18 ENCOUNTER — Ambulatory Visit (HOSPITAL_COMMUNITY): Payer: BLUE CROSS/BLUE SHIELD | Admitting: Physical Therapy

## 2015-01-18 DIAGNOSIS — M545 Low back pain: Secondary | ICD-10-CM | POA: Diagnosis not present

## 2015-01-18 DIAGNOSIS — R6889 Other general symptoms and signs: Secondary | ICD-10-CM

## 2015-01-18 DIAGNOSIS — R29898 Other symptoms and signs involving the musculoskeletal system: Secondary | ICD-10-CM

## 2015-01-18 DIAGNOSIS — M5441 Lumbago with sciatica, right side: Secondary | ICD-10-CM

## 2015-01-18 DIAGNOSIS — M25651 Stiffness of right hip, not elsewhere classified: Secondary | ICD-10-CM

## 2015-01-18 DIAGNOSIS — R262 Difficulty in walking, not elsewhere classified: Secondary | ICD-10-CM

## 2015-01-18 DIAGNOSIS — S63659A Sprain of metacarpophalangeal joint of unspecified finger, initial encounter: Secondary | ICD-10-CM

## 2015-01-18 DIAGNOSIS — M25649 Stiffness of unspecified hand, not elsewhere classified: Secondary | ICD-10-CM

## 2015-01-18 NOTE — Therapy (Signed)
[place], [place], [address] Phone: [phone]   Fax:  [phone]  Physical Therapy Treatment  Patient Details  Name: Betty Bryan MRN: [id number] Date of Birth: Mar 04, 1956 Referring Provider:  Crist Infante, MD  Encounter Date: 01/18/2015      PT End of Session - 01/18/15 1756    Visit Number 8   Number of Visits 18   Date for PT Re-Evaluation 01/22/15   Authorization Type BCBS   Authorization Time Period 12/23/14 - 02/22/15   Authorization - Visit Number 8   Authorization - Number of Visits 16   PT Start Time 6812   PT Stop Time 1815   PT Time Calculation (min) 40 min   Activity Tolerance Patient tolerated treatment well   Behavior During Therapy [facility] for tasks assessed/performed      Past Medical History  Diagnosis Date  . Palpitations   . PVC's (premature ventricular contractions)   . Ulcerative colitis   . HTN (hypertension)   . Adrenal insufficiency     Past Surgical History  Procedure Laterality Date  . Nasal sinus surgery    . Hand surgery      There were no vitals filed for this visit.  Visit Diagnosis:  Right-sided low back pain with right-sided sciatica  Hip stiffness, right  Weakness of both lower extremities  Difficulty walking  Sagittal band rupture at metacarpophalangeal joint, initial encounter  Decreased range of motion of finger  Postural instability of trunk      Subjective Assessment - 01/18/15 1733    Subjective Patient notes contieud Rt ankel pain thoguh ashe has improved back and hip pain noting that those exercises are helping.    Pain Score 6    Pain Location Foot   Pain Orientation Right;Lateral   Pain Descriptors / Indicators Sore   Pain Type Chronic pain          [facility] Adult PT Treatment/Exercise - 01/18/15 0001    Lumbar Exercises: Stretches   Standing Side Bend Limitations Groin stretch 10x 3 seconds   Lumbar Exercises: Standing   Forward Lunge  Limitations antrior lateral lunge with opposite side reach 20x   Side Lunge Limitations Sumo walk with Red Tband 2RT   Row AROM;Strengthening;Right;20 reps;Theraband   Theraband Level (Row) Level 4 (Blue)   Other Standing Lumbar Exercises L UE punches with blue therband 20x;  overhead press with R UE 5# 10x, Right side bend to neutral with Rt UE holding 10lb dumbbell.    Other Standing Lumbar Exercises 3D hip excursions in staggered stance 10x, calf stretch with lateral heel wedge 3x 30" (focusing on increasing pronation    Manual Therapy   Manual Therapy Joint mobilization   Joint Mobilization Grade 4 joint mobs to R foot supination/pronation, calcaneous, talus            PT Education - 01/18/15 1819    Education provided Yes   Education Details HEP: groin and ersion based calf stretches.    Person(s) Educated Patient   Methods Explanation;Handout   Comprehension Verbalized understanding;Returned demonstration          PT Short Term Goals - 01/18/15 1820    PT SHORT TERM GOAL #1   Title Patient will display equal rotation through lumbar spine of 80 degrees Lt ad Rt indicating improved posture   Status On-going   PT SHORT TERM GOAL #2   Title Patient will display equal sidebending through lumbar spine indicatign  improved posture   Status On-going   PT SHORT TERM GOAL #3   Title patient will dmeosntrate hip alignement within normla limits indicating improved hip posture.    Status On-going   PT SHORT TERM GOAL #4   Title Patient will demonstrate 4/5 MMT abdominal strength indicating improved trunk stability.    Status On-going   PT SHORT TERM GOAL #5   Title Patient will be independent with HEP.    Status Achieved           PT Long Term Goals - 01/18/15 1821    PT LONG TERM GOAL #1   Title Patient will demonstrate 5/5 MMT abdominal strength indicating improved trunk stability to maintain normal hip alignement between sessions.    Status On-going   PT LONG TERM GOAL  #2   Title Patient will demosntrate glut strength of 4+/5 MMT to be able to walk 81minutes wth out pain or trendelenberg sound.    Status On-going   PT LONG TERM GOAL #3   Title Patient will be able to tolerate sitting >4 hours to perform long car rides.    Status On-going           Plan - 01/18/15 1821    Clinical Impression Statement Session focused on increasing Rt ankle mobility and decreasing pain by increasign Rt ankle eversion, dorsivflexion and increasing gait width mechiancs to decrease pain. patient demosntrates improved pain following ankel eversion based stretches and groin stretches. Continued low back and hip strengthening exercises for posture and maintained program.    PT Next Visit Plan Continue all exercises and manual to improve adaptation and gait. Increase resitance as tolerated        Problem List There are no active problems to display for this patient.  Devona Konig PT DPT [place] [address] [place], [place], [address] Phone: [phone]   Fax:  [phone]

## 2015-01-18 NOTE — Patient Instructions (Signed)
Calf  Stretches             2 way Groin stretch

## 2015-01-19 ENCOUNTER — Encounter (HOSPITAL_COMMUNITY): Payer: BLUE CROSS/BLUE SHIELD | Admitting: Physical Therapy

## 2015-01-24 ENCOUNTER — Ambulatory Visit (HOSPITAL_COMMUNITY): Payer: BLUE CROSS/BLUE SHIELD | Attending: Orthopedic Surgery

## 2015-01-24 DIAGNOSIS — M25649 Stiffness of unspecified hand, not elsewhere classified: Secondary | ICD-10-CM

## 2015-01-24 DIAGNOSIS — M25651 Stiffness of right hip, not elsewhere classified: Secondary | ICD-10-CM | POA: Insufficient documentation

## 2015-01-24 DIAGNOSIS — R6889 Other general symptoms and signs: Secondary | ICD-10-CM

## 2015-01-24 DIAGNOSIS — M5441 Lumbago with sciatica, right side: Secondary | ICD-10-CM

## 2015-01-24 DIAGNOSIS — M545 Low back pain: Secondary | ICD-10-CM | POA: Diagnosis present

## 2015-01-24 DIAGNOSIS — R29898 Other symptoms and signs involving the musculoskeletal system: Secondary | ICD-10-CM

## 2015-01-24 DIAGNOSIS — S63659A Sprain of metacarpophalangeal joint of unspecified finger, initial encounter: Secondary | ICD-10-CM

## 2015-01-24 DIAGNOSIS — M6281 Muscle weakness (generalized): Secondary | ICD-10-CM | POA: Insufficient documentation

## 2015-01-24 DIAGNOSIS — R262 Difficulty in walking, not elsewhere classified: Secondary | ICD-10-CM | POA: Insufficient documentation

## 2015-01-24 NOTE — Patient Instructions (Signed)
Bridge   Lie back, legs bent. Inhale, pressing hips up. Keeping ribs in, lengthen lower back. Exhale, rolling down along spine from top. Repeat 10-20 times. Do 1-2 sessions per day.  Copyright  VHI. All rights reserved.   Small Ball Marching Supine   Lie supine with knees flexed. Raise one leg a few inches. Hold briefly, return and raise other leg. Keep hips stationary. Do 1-2  sets of 10-20 repetitions.  Copyright  VHI. All rights reserved.

## 2015-01-24 NOTE — Therapy (Signed)
[place], [place], [address] Phone: [phone]   Fax:  [phone]  Physical Therapy Treatment  Patient Details  Name: Betty Bryan MRN: [id number] Date of Birth: 08-03-1956 Referring Provider:  Crist Infante, MD  Encounter Date: 01/24/2015      PT End of Session - 01/24/15 1735    Visit Number 9   Number of Visits 18   Date for PT Re-Evaluation 02/22/15   Authorization Type BCBS   Authorization Time Period 12/23/14 - 02/22/15   Authorization - Visit Number 9   Authorization - Number of Visits 16   PT Start Time 1324   PT Stop Time 1842   PT Time Calculation (min) 64 min   Activity Tolerance Patient tolerated treatment well   Behavior During Therapy [facility] for tasks assessed/performed      Past Medical History  Diagnosis Date  . Palpitations   . PVC's (premature ventricular contractions)   . Ulcerative colitis   . HTN (hypertension)   . Adrenal insufficiency     Past Surgical History  Procedure Laterality Date  . Nasal sinus surgery    . Hand surgery      There were no vitals filed for this visit.  Visit Diagnosis:  Right-sided low back pain with right-sided sciatica  Hip stiffness, right  Weakness of both lower extremities  Difficulty walking  Sagittal band rupture at metacarpophalangeal joint, initial encounter  Decreased range of motion of finger  Postural instability of trunk      Subjective Assessment - 01/24/15 1739    Subjective Pt stated back and hips are feeling good today, continues to c/o lateral aspect Rt foot sharp pain with weight bearing.  Pt arrived to session with order from MD Sharol Given to begin iontophoresis for lateral aspect Rt foot.   How long can you sit comfortably? 01/24/2015, able to tolerate sitting through a movie for 2-2.5 hours then becomes sore and stiff   How long can you stand comfortably? Able to stand comfortably for 5-10 minutes prior the discomft.okay except  for prolonged standing   How long can you walk comfortably? Able to walk for 5 mintutes prior discomfort Rt foot, reports stairs are becoming easier through are painful (was always uncomfortable, stairs are exceptionally painful. )   Currently in Pain? Yes   Pain Score 5    Pain Location Foot   Pain Orientation Right;Lateral   Pain Descriptors / Indicators Sharp  sharp with weight bearing            [facility] PT Assessment - 01/24/15 0001    Assessment   Medical Diagnosis Spondylosis   Onset Date 07/24/14   Next MD Visit Sharol Given 02/23/2015   Prior Therapy no   AROM   AROM Assessment Site Hip;Lumbar   Right/Left Hip Right;Left   Right Hip External Rotation  49   Right Hip Internal Rotation  47   Left Hip External Rotation  48   Left Hip Internal Rotation  45  was 45   Right/Left Knee Right;Left   Right/Left Ankle Right;Left   Lumbar Flexion 100  was 100   Lumbar Extension 45  was 44   Lumbar - Left Side Bend equal   Lumbar - Right Rotation 75  was 71   Lumbar - Left Rotation 80  was 80   Strength   Strength Assessment Site Hip;Lumbar   Right/Left Hip Right;Left   Right Hip Flexion 4+/5  was 4/5  Right Hip Extension 4/5  was 4/5   Right Hip ABduction 4/5  was 4-/5   Left Hip Flexion 4+/5  was 4/5   Left Hip Extension 4/5  was 4/5   Left Hip ABduction 4+/5  was 4/5   Right Knee Flexion 4-/5  was 4-/5   Right Knee Extension 4+/5  was 4/5   Left Knee Flexion 4/5  was 4/5   Left Knee Extension 5/5  was 4/5   Lumbar Flexion 3-/5  was 2+/5   Lumbar Extension 3-/5  was 2+/5   Flexibility   Soft Tissue Assessment /Muscle Length yes   Hamstrings WNL   Quadriceps WNL   ITB WNL  limited on Rt   Piriformis WNL  limited on Rt > than Lt                     [facility] Adult PT Treatment/Exercise - 01/24/15 0001    Exercises   Exercises Lumbar   Lumbar Exercises: Stretches   Quad Stretch 3 reps;30 seconds   Quad Stretch Limitations gastroc stretch with  towel lateral aspect   Piriformis Stretch 3 reps;30 seconds   Piriformis Stretch Limitations seated   Lumbar Exercises: Standing   Row AROM;Strengthening;Right;20 reps;Theraband   Theraband Level (Row) Level 4 (Blue)   Other Standing Lumbar Exercises UE overhead matrix with 3# 5 reps   Other Standing Lumbar Exercises 3D hip excursions in staggered stance 10x, calf stretch with lateral heel wedge 3x 30" (focusing on increasing pronation    Lumbar Exercises: Supine   Bent Knee Raise 5 reps;5 seconds   Bent Knee Raise Limitations HEP given following cueing for stabiltiy   Bridge 15 reps                  PT Short Term Goals - 01/24/15 1737    PT SHORT TERM GOAL #1   Title Patient will display equal rotation through lumbar spine of 80 degrees Lt ad Rt indicating improved posture   Baseline 01/27/2015 Lt 75, Rt 80   Status On-going   PT SHORT TERM GOAL #2   Title Patient will display equal sidebending through lumbar spine indicatign improved posture   Status Achieved   PT SHORT TERM GOAL #3   Title patient will dmeosntrate hip alignement within normla limits indicating improved hip posture.    Baseline 01/24/2015 SI within alignment, pt continues to require cueing for appropriate posture.   Status On-going   PT SHORT TERM GOAL #4   Title Patient will demonstrate 4/5 MMT abdominal strength indicating improved trunk stability.    Status On-going   PT SHORT TERM GOAL #5   Title Patient will be independent with HEP.    Baseline 01/09/2015 Reports compliance daily   Status Achieved           PT Long Term Goals - 01/24/15 1811    PT LONG TERM GOAL #1   Title Patient will demonstrate 5/5 MMT abdominal strength indicating improved trunk stability to maintain normal hip alignement between sessions.    Status On-going   PT LONG TERM GOAL #2   Title Patient will demosntrate glut strength of 4+/5 MMT to be able to walk 47minutes wth out pain or trendelenberg sound.    Status  On-going   PT LONG TERM GOAL #3   Title Patient will be able to tolerate sitting >4 hours to perform long car rides.    Baseline 01/24/2015, able to tolerate sitting through a movie  for 2-2.5 hours then becomes sore and stiff   Status On-going               Plan - 01/24/15 1833    Clinical Impression Statement Reviewed goals, MMT and ROM measurements taken with the following findings:  Pt independent with HEP daily with ability to demonstrate appropraite technique with all exercises.  Noted improve lumbar and hip mobilty with reports of minimal back pain now days.  SI with good alignment.  Pt main concern currently with Rt lateral foot pain with standing or gait for standing longer than 5-10 minutes.  MD sent order to begin iontophoresis, began this session. Pt continues to demonstrate weak core, lumbar extensiors, gluteal, and hamstring  musculature.  Pt will continue to benefit from skilled intervention to improve strengthening for weak musculature.  Pt given additional HEP printout for core and gltueal strenghtening.     PT Next Visit Plan Recommend continuing OPPT for 4 more weeks to address goals unmet.  Next session continue with iontophoresis to lateral aspect of Rt foot, increase resistance with tband and progress to cable tower as able, continue with UE overhead matrix with cueing for proper form, progress to lunges and improve form and mechanics with    PT Home Exercise Plan Bridges and bent knee raise with core stabilitized        Problem List There are no active problems to display for this patient.  [place], [place]; [place] [id number] [place] [address] [place], [place], [address] Phone: [phone]   Fax:  [phone]     Devona Konig PT DPT [phone]

## 2015-01-27 ENCOUNTER — Ambulatory Visit (HOSPITAL_COMMUNITY): Payer: BLUE CROSS/BLUE SHIELD | Admitting: Physical Therapy

## 2015-01-27 DIAGNOSIS — M545 Low back pain: Secondary | ICD-10-CM | POA: Diagnosis not present

## 2015-01-27 DIAGNOSIS — R262 Difficulty in walking, not elsewhere classified: Secondary | ICD-10-CM

## 2015-01-27 DIAGNOSIS — S63659A Sprain of metacarpophalangeal joint of unspecified finger, initial encounter: Secondary | ICD-10-CM

## 2015-01-27 DIAGNOSIS — M25649 Stiffness of unspecified hand, not elsewhere classified: Secondary | ICD-10-CM

## 2015-01-27 DIAGNOSIS — R6889 Other general symptoms and signs: Secondary | ICD-10-CM

## 2015-01-27 DIAGNOSIS — M25651 Stiffness of right hip, not elsewhere classified: Secondary | ICD-10-CM

## 2015-01-27 DIAGNOSIS — M5441 Lumbago with sciatica, right side: Secondary | ICD-10-CM

## 2015-01-27 DIAGNOSIS — R29898 Other symptoms and signs involving the musculoskeletal system: Secondary | ICD-10-CM

## 2015-01-27 NOTE — Therapy (Signed)
[place], [place], [address] Phone: [phone]   Fax:  [phone]  Physical Therapy Treatment  Patient Details  Name: Betty Bryan MRN: [id number] Date of Birth: [birth year] Referring Provider:  Crist Infante, MD  Encounter Date: 01/27/2015      PT End of Session - 01/27/15 1841    Visit Number 10   Number of Visits 18   Date for PT Re-Evaluation 02/22/15   Authorization Type BCBS   Authorization Time Period 12/23/14 - 02/22/15   Authorization - Visit Number 10   Authorization - Number of Visits 16   PT Start Time 1324   PT Stop Time 1835   PT Time Calculation (min) 57 min   Activity Tolerance Patient tolerated treatment well   Behavior During Therapy [facility] for tasks assessed/performed      Past Medical History  Diagnosis Date  . Palpitations   . PVC's (premature ventricular contractions)   . Ulcerative colitis   . HTN (hypertension)   . Adrenal insufficiency     Past Surgical History  Procedure Laterality Date  . Nasal sinus surgery    . Hand surgery      There were no vitals filed for this visit.  Visit Diagnosis:  Right-sided low back pain with right-sided sciatica  Hip stiffness, right  Weakness of both lower extremities  Difficulty walking  Sagittal band rupture at metacarpophalangeal joint, initial encounter  Decreased range of motion of finger  Postural instability of trunk                       [facility] Adult PT Treatment/Exercise - 01/27/15 0001    Lumbar Exercises: Stretches   Standing Side Bend Limitations Groin stretch 10x 3 seconds   Quad Stretch 3 reps;30 seconds   Quad Stretch Limitations gastroc stretch with towel lateral aspect   ITB Stretch Limitations 10x 3 seconds   Piriformis Stretch 3 reps;30 seconds   Piriformis Stretch Limitations seated   Lumbar Exercises: Standing   Forward Lunge Limitations antrior lateral lunge with opposite side reach 20x   Side Lunge Limitations Sumo walk with [employer] AROM;Strengthening;Right;20 reps;Theraband   Theraband Level (Row) --   Row Limitations Cybex cables: 20lb, Punch with Left   Other Standing Lumbar Exercises overhead press Rt UE 5lb 20x, Rt sidebending to neutral 10lb 20x   Other Standing Lumbar Exercises 3D hip excursions in staggered stance 10x, 3 way calf stretch with lateral heel wedge 10x 3" (focusing on increasing pronation    Modalities   Modalities Iontophoresis   Iontophoresis   Type of Iontophoresis Dexamethasone   Location Rt lateral foot   Dose 4.0   Time 34min   Manual Therapy   Manual Therapy Joint mobilization   Joint Mobilization Grade 4 joint mobs to R foot supination/pronation, calcaneous, talus                   PT Short Term Goals - 01/24/15 1737    PT SHORT TERM GOAL #1   Title Patient will display equal rotation through lumbar spine of 80 degrees Lt ad Rt indicating improved posture   Baseline 01/27/2015 Lt 75, Rt 80   Status On-going   PT SHORT TERM GOAL #2   Title Patient will display equal sidebending through lumbar spine indicatign improved posture   Status Achieved   PT SHORT TERM GOAL #3   Title patient will dmeosntrate hip alignement  within normla limits indicating improved hip posture.    Baseline 01/24/2015 SI within alignment, pt continues to require cueing for appropriate posture.   Status On-going   PT SHORT TERM GOAL #4   Title Patient will demonstrate 4/5 MMT abdominal strength indicating improved trunk stability.    Status On-going   PT SHORT TERM GOAL #5   Title Patient will be independent with HEP.    Baseline 01/09/2015 Reports compliance daily   Status Achieved           PT Long Term Goals - 01/24/15 1811    PT LONG TERM GOAL #1   Title Patient will demonstrate 5/5 MMT abdominal strength indicating improved trunk stability to maintain normal hip alignement between sessions.    Status On-going   PT LONG TERM  GOAL #2   Title Patient will demosntrate glut strength of 4+/5 MMT to be able to walk 36minutes wth out pain or trendelenberg sound.    Status On-going   PT LONG TERM GOAL #3   Title Patient will be able to tolerate sitting >4 hours to perform long car rides.    Baseline 01/24/2015, able to tolerate sitting through a movie for 2-2.5 hours then becomes sore and stiff   Status On-going               Plan - 01/27/15 1828    Clinical Impression Statement Session focused on progression of strengthening exercises to increace postural stability to further promote prolonged relief of back pain. manual performed to increase foot mobility as patient dispalys limited ankle and calcaneal eversion that improved followign joint mobilization with atient noting improved feeling of gait.    PT Next Visit Plan  continue with iontophoresis to lateral aspect of Rt foot, increase resistance with tband and progress to cable tower as able,  progress to lunges        Problem List There are no active problems to display for this patient.   Betty Bryan 01/27/2015, 6:43 PM  [place] [address] [place], [place], [address] Phone: [phone]   Fax:  [phone]

## 2015-01-31 ENCOUNTER — Encounter (HOSPITAL_COMMUNITY): Payer: BLUE CROSS/BLUE SHIELD

## 2015-02-02 ENCOUNTER — Ambulatory Visit (HOSPITAL_COMMUNITY): Payer: BLUE CROSS/BLUE SHIELD | Admitting: Physical Therapy

## 2015-02-02 DIAGNOSIS — M545 Low back pain: Secondary | ICD-10-CM | POA: Diagnosis not present

## 2015-02-02 DIAGNOSIS — R262 Difficulty in walking, not elsewhere classified: Secondary | ICD-10-CM

## 2015-02-02 DIAGNOSIS — M25651 Stiffness of right hip, not elsewhere classified: Secondary | ICD-10-CM

## 2015-02-02 DIAGNOSIS — R29898 Other symptoms and signs involving the musculoskeletal system: Secondary | ICD-10-CM

## 2015-02-02 DIAGNOSIS — M5441 Lumbago with sciatica, right side: Secondary | ICD-10-CM

## 2015-02-02 NOTE — Therapy (Signed)
[place], [place], [address] Phone: [phone]   Fax:  [phone]  Physical Therapy Treatment  Patient Details  Name: Betty Bryan MRN: [id number] Date of Birth: 1956-01-22 Referring Provider:  Crist Infante, MD  Encounter Date: 02/02/2015      PT End of Session - 02/02/15 1812    Visit Number 11   Number of Visits 18   Date for PT Re-Evaluation 02/22/15   Authorization Type BCBS   Authorization Time Period 12/23/14 - 02/22/15   Authorization - Visit Number 11   Authorization - Number of Visits 16   PT Start Time 8466   PT Stop Time 1825   PT Time Calculation (min) 47 min   Activity Tolerance Patient tolerated treatment well   Behavior During Therapy [facility] for tasks assessed/performed      Past Medical History  Diagnosis Date  . Palpitations   . PVC's (premature ventricular contractions)   . Ulcerative colitis   . HTN (hypertension)   . Adrenal insufficiency     Past Surgical History  Procedure Laterality Date  . Nasal sinus surgery    . Hand surgery      There were no vitals filed for this visit.  Visit Diagnosis:  Right-sided low back pain with right-sided sciatica  Hip stiffness, right  Weakness of both lower extremities  Difficulty walking      Subjective Assessment - 02/02/15 1741    Subjective Pt stated back and hips are feeling good today, continues to c/o lateral aspect Rt foot sharp pain with weight bearing though today this too is improved, predominant pain ain anterior ankle on Rt today.     Currently in Pain? Yes   Pain Score 3    Pain Location Ankle   Pain Orientation Right;Lateral          [facility] Adult PT Treatment/Exercise - 02/02/15 0001    Lumbar Exercises: Stretches   Standing Side Bend Limitations Groin stretch 10x 3 seconds   Quad Stretch 3 reps;30 seconds   Quad Stretch Limitations gastroc stretch with towel lateral aspect   ITB Stretch Limitations 10x 3  seconds   Piriformis Stretch 3 reps;30 seconds   Piriformis Stretch Limitations seated   Lumbar Exercises: Standing   Functional Squats Limitations 4 way pick up and reach 10x 5lb dumbbell .   Forward Lunge Limitations antrior lateral lunge with opposite side reach 20x   Side Lunge Limitations Sumo walk with [employer] AROM;Strengthening;Right;20 reps;Theraband   Row Limitations Cybex cables: 20lb, Punch with Left   Other Standing Lumbar Exercises overhead press Rt UE 5lb 20x, Rt sidebending to neutral 10lb 20x   Other Standing Lumbar Exercises  3 way calf stretch with lateral heel wedge 10x 3" (focusing on increasing pronation    Iontophoresis   Type of Iontophoresis Dexamethasone   Location Rt lateral foot   Dose 4.0   Time 27min           PT Short Term Goals - 01/24/15 1737    PT SHORT TERM GOAL #1   Title Patient will display equal rotation through lumbar spine of 80 degrees Lt ad Rt indicating improved posture   Baseline 01/27/2015 Lt 75, Rt 80   Status On-going   PT SHORT TERM GOAL #2   Title Patient will display equal sidebending through lumbar spine indicatign improved posture   Status Achieved   PT SHORT TERM GOAL #3   Title patient  will dmeosntrate hip alignement within normla limits indicating improved hip posture.    Baseline 01/24/2015 SI within alignment, pt continues to require cueing for appropriate posture.   Status On-going   PT SHORT TERM GOAL #4   Title Patient will demonstrate 4/5 MMT abdominal strength indicating improved trunk stability.    Status On-going   PT SHORT TERM GOAL #5   Title Patient will be independent with HEP.    Baseline 01/09/2015 Reports compliance daily   Status Achieved           PT Long Term Goals - 01/24/15 1811    PT LONG TERM GOAL #1   Title Patient will demonstrate 5/5 MMT abdominal strength indicating improved trunk stability to maintain normal hip alignement between sessions.    Status On-going   PT LONG  TERM GOAL #2   Title Patient will demosntrate glut strength of 4+/5 MMT to be able to walk 90minutes wth out pain or trendelenberg sound.    Status On-going   PT LONG TERM GOAL #3   Title Patient will be able to tolerate sitting >4 hours to perform long car rides.    Baseline 01/24/2015, able to tolerate sitting through a movie for 2-2.5 hours then becomes sore and stiff   Status On-going            Plan - 02/02/15 1815    Clinical Impression Statement Session continued focus on increasing lumbar spine stability via postural strengtheing exercises and decreasing foot pain via manual mobilization [employer] to increase ROM and iontophoresis to decrease inflammation. Patient noted decreased pain at end of sesion,    PT Next Visit Plan  continue with iontophoresis to lateral and/or anterior ankle aspect of Rt foot, increase resistance with tband and progress to cable tower as able,  progress to lunges and attempt planks.        Problem List There are no active problems to display for this patient.  Devona Konig PT DPT [facility] [address] [place], [place], [address] Phone: [phone]   Fax:  [phone]

## 2015-02-07 ENCOUNTER — Ambulatory Visit (HOSPITAL_COMMUNITY): Payer: BLUE CROSS/BLUE SHIELD | Admitting: Physical Therapy

## 2015-02-07 DIAGNOSIS — M25649 Stiffness of unspecified hand, not elsewhere classified: Secondary | ICD-10-CM

## 2015-02-07 DIAGNOSIS — R6889 Other general symptoms and signs: Secondary | ICD-10-CM

## 2015-02-07 DIAGNOSIS — M5441 Lumbago with sciatica, right side: Secondary | ICD-10-CM

## 2015-02-07 DIAGNOSIS — M545 Low back pain: Secondary | ICD-10-CM | POA: Diagnosis not present

## 2015-02-07 DIAGNOSIS — M25651 Stiffness of right hip, not elsewhere classified: Secondary | ICD-10-CM

## 2015-02-07 DIAGNOSIS — R262 Difficulty in walking, not elsewhere classified: Secondary | ICD-10-CM

## 2015-02-07 DIAGNOSIS — R29898 Other symptoms and signs involving the musculoskeletal system: Secondary | ICD-10-CM

## 2015-02-07 NOTE — Therapy (Signed)
[place], [place], [address] Phone: [phone]   Fax:  [phone]  Physical Therapy Treatment  Patient Details  Name: Betty Bryan MRN: [id number] Date of Birth: Mar 15, 1956 Referring Provider:  Crist Infante, MD  Encounter Date: 02/07/2015      PT End of Session - 02/07/15 1824    Visit Number 12   Number of Visits 18   Date for PT Re-Evaluation 02/22/15   Authorization Type BCBS   Authorization Time Period 12/23/14 - 02/22/15   Authorization - Visit Number 12   Authorization - Number of Visits 16   PT Start Time 7253   PT Stop Time 6644   PT Time Calculation (min) 57 min   Activity Tolerance Patient tolerated treatment well   Behavior During Therapy [facility] for tasks assessed/performed      Past Medical History  Diagnosis Date  . Palpitations   . PVC's (premature ventricular contractions)   . Ulcerative colitis   . HTN (hypertension)   . Adrenal insufficiency     Past Surgical History  Procedure Laterality Date  . Nasal sinus surgery    . Hand surgery      There were no vitals filed for this visit.  Visit Diagnosis:  Right-sided low back pain with right-sided sciatica  Hip stiffness, right  Weakness of both lower extremities  Difficulty walking  Decreased range of motion of finger  Postural instability of trunk      Subjective Assessment - 02/07/15 1739    Subjective Patient notes her back has continued to improve, no pain. patient meanwhile notes foot pain on Rt is also improved with iontophoresis.    Currently in Pain? Yes   Pain Score 3    Pain Location Ankle   Pain Orientation Right;Lateral   Pain Descriptors / Indicators Sharp   Pain Type Chronic pain                         [facility] Adult PT Treatment/Exercise - 02/07/15 0001    Lumbar Exercises: Stretches   Standing Side Bend Limitations Groin stretch 2x 20 seconds   Quad Stretch 30 seconds;5 reps   Quad  Stretch Limitations gastroc stretch with towel lateral aspect   ITB Stretch Limitations 10x 3 seconds   Piriformis Stretch 3 reps;30 seconds   Piriformis Stretch Limitations seated   Lumbar Exercises: Standing   Heel Raises Limitations 3D ankle excursions on floor. 10x   Functional Squats Limitations 4 way pick up and reach 10x 5lb dumbbell .   Forward Lunge Limitations Lunge matrix common 5x each    Side Lunge Limitations Sumo walk with GreenTband 2RT   Row Limitations Cybex cables: 20lb,Row twith Right; Punch with Left 10x    Other Standing Lumbar Exercises overhead press Rt UE 5lb 20x, Rt sidebending to neutral 10lb 20x   Iontophoresis   Type of Iontophoresis Dexamethasone   Location Rt lateral foot   Dose 4.0   Time 55min   Manual Therapy   Manual Therapy Joint mobilization   Joint Mobilization Grade 4 joint mobs to R foot supination/pronation, calcaneous, talus 47minutes                  PT Short Term Goals - 01/24/15 1737    PT SHORT TERM GOAL #1   Title Patient will display equal rotation through lumbar spine of 80 degrees Lt ad Rt indicating improved posture   Baseline 01/27/2015  Lt 75, Rt 80   Status On-going   PT SHORT TERM GOAL #2   Title Patient will display equal sidebending through lumbar spine indicatign improved posture   Status Achieved   PT SHORT TERM GOAL #3   Title patient will dmeosntrate hip alignement within normla limits indicating improved hip posture.    Baseline 01/24/2015 SI within alignment, pt continues to require cueing for appropriate posture.   Status On-going   PT SHORT TERM GOAL #4   Title Patient will demonstrate 4/5 MMT abdominal strength indicating improved trunk stability.    Status On-going   PT SHORT TERM GOAL #5   Title Patient will be independent with HEP.    Baseline 01/09/2015 Reports compliance daily   Status Achieved           PT Long Term Goals - 01/24/15 1811    PT LONG TERM GOAL #1   Title Patient will  demonstrate 5/5 MMT abdominal strength indicating improved trunk stability to maintain normal hip alignement between sessions.    Status On-going   PT LONG TERM GOAL #2   Title Patient will demosntrate glut strength of 4+/5 MMT to be able to walk 15minutes wth out pain or trendelenberg sound.    Status On-going   PT LONG TERM GOAL #3   Title Patient will be able to tolerate sitting >4 hours to perform long car rides.    Baseline 01/24/2015, able to tolerate sitting through a movie for 2-2.5 hours then becomes sore and stiff   Status On-going               Plan - 02/07/15 1825    Clinical Impression Statement Session continued focus on progressing lumbar spine stabilization and hip mobility with secondary focus on increasing foot and ankle ROM/stability to decrease ankle/foot pain. manual mobilization techniques performed to increase ROM which is normalizing and iontophoresis to decrease inflammation. Patient noted improved symptoms at end of session and able to ambulate without "significant" pain.    PT Next Visit Plan  continue with iontophoresis to lateral and/or anterior ankle aspect of Rt foot, increase resistance to blue tband for sumo walks and progress to cable tower as able. attempt planks and heel raises off step.          Problem List There are no active problems to display for this patient.  Devona Konig PT DPT [place] [address] [place], [place], [address] Phone: [phone]   Fax:  [phone]

## 2015-02-09 ENCOUNTER — Ambulatory Visit (HOSPITAL_COMMUNITY): Payer: BLUE CROSS/BLUE SHIELD

## 2015-02-09 ENCOUNTER — Telehealth (HOSPITAL_COMMUNITY): Payer: Self-pay

## 2015-02-09 DIAGNOSIS — R262 Difficulty in walking, not elsewhere classified: Secondary | ICD-10-CM

## 2015-02-09 DIAGNOSIS — M5441 Lumbago with sciatica, right side: Secondary | ICD-10-CM

## 2015-02-09 DIAGNOSIS — M25649 Stiffness of unspecified hand, not elsewhere classified: Secondary | ICD-10-CM

## 2015-02-09 DIAGNOSIS — R29898 Other symptoms and signs involving the musculoskeletal system: Secondary | ICD-10-CM

## 2015-02-09 DIAGNOSIS — S63659A Sprain of metacarpophalangeal joint of unspecified finger, initial encounter: Secondary | ICD-10-CM

## 2015-02-09 DIAGNOSIS — M545 Low back pain: Secondary | ICD-10-CM | POA: Diagnosis not present

## 2015-02-09 DIAGNOSIS — R6889 Other general symptoms and signs: Secondary | ICD-10-CM

## 2015-02-09 DIAGNOSIS — M25651 Stiffness of right hip, not elsewhere classified: Secondary | ICD-10-CM

## 2015-02-09 NOTE — Therapy (Signed)
[place], [place], [address] Phone: [phone]   Fax:  [phone]  Physical Therapy Treatment  Patient Details  Name: Betty Bryan MRN: [id number] Date of Birth: 1955-12-09 Referring Provider:  Newt Minion, MD  Encounter Date: 02/09/2015      PT End of Session - 02/09/15 1717    Visit Number 13   Number of Visits 18   Date for PT Re-Evaluation 02/22/15   Authorization Type BCBS   Authorization Time Period 12/23/14 - 02/22/15   Authorization - Visit Number 13   Authorization - Number of Visits 16   PT Start Time 0630   PT Stop Time 1803   PT Time Calculation (min) 53 min   Activity Tolerance Patient tolerated treatment well   Behavior During Therapy [facility] for tasks assessed/performed      Past Medical History  Diagnosis Date  . Palpitations   . PVC's (premature ventricular contractions)   . Ulcerative colitis   . HTN (hypertension)   . Adrenal insufficiency     Past Surgical History  Procedure Laterality Date  . Nasal sinus surgery    . Hand surgery      There were no vitals filed for this visit.  Visit Diagnosis:  Right-sided low back pain with right-sided sciatica  Hip stiffness, right  Weakness of both lower extremities  Difficulty walking  Decreased range of motion of finger  Postural instability of trunk  Sagittal band rupture at metacarpophalangeal joint, initial encounter      Subjective Assessment - 02/09/15 1710    Subjective Pt stated she called out of work today due to headaches.  Pt reports improvements with iontophoresis, stated she has new pain on Rt great toe and plantar fascia increased Lt foot today believes related to the weather.  Reports back and hip are feeling great, has been doing her exercises at home.     Currently in Pain? Yes   Pain Score 4    Pain Location Foot   Pain Orientation Right   Pain Descriptors / Indicators Patsi Sears PT  Assessment - 02/09/15 0001    Assessment   Medical Diagnosis Spondylosis   Onset Date 07/24/14   Next MD Visit Sharol Given 02/23/2015   Prior Therapy no             [facility] Adult PT Treatment/Exercise - 02/09/15 0001    Exercises   Exercises Lumbar   Lumbar Exercises: Stretches   Press Ups Limitations Plantar fascia stretch 3x 30" Bil LE   Piriformis Stretch 3 reps;30 seconds   Piriformis Stretch Limitations seated   Lumbar Exercises: Standing   Heel Raises Limitations 3D ankle excursions on floor. 10x   Functional Squats Limitations 4 way pick up and reach 10x 5lb dumbbell .   Forward Lunge Limitations Lunge matrix common 10x each    Side Lunge Limitations Sumo walk with Blue Tband 2RT   Row Limitations Cybex cables punch with left   Other Standing Lumbar Exercises overhead press Rt UE 5lb 20x, Rt sidebending to neutral 10lb 20x   Other Standing Lumbar Exercises LE ground matrix in plank position on side of mat 5x each   Iontophoresis   Type of Iontophoresis Dexamethasone   Location Rt lateral foot   Dose 4.0   Time 21min   Manual Therapy   Manual Therapy Joint mobilization   Joint Mobilization Grade 4 joint mobs  to R foot supination/pronation, calcaneous, talus 17minutes            PT Short Term Goals - 02/09/15 1722    PT SHORT TERM GOAL #1   Title Patient will display equal rotation through lumbar spine of 80 degrees Lt ad Rt indicating improved posture   Status On-going   PT SHORT TERM GOAL #2   Title Patient will display equal sidebending through lumbar spine indicatign improved posture   Status Achieved   PT SHORT TERM GOAL #3   Title patient will dmeosntrate hip alignement within normla limits indicating improved hip posture.    Status On-going   PT SHORT TERM GOAL #4   Title Patient will demonstrate 4/5 MMT abdominal strength indicating improved trunk stability.    Status On-going   PT SHORT TERM GOAL #5   Title Patient will be independent with HEP.    Status  Achieved           PT Long Term Goals - 02/09/15 1723    PT LONG TERM GOAL #1   Title Patient will demonstrate 5/5 MMT abdominal strength indicating improved trunk stability to maintain normal hip alignement between sessions.    Status On-going   PT LONG TERM GOAL #2   Title Patient will demosntrate glut strength of 4+/5 MMT to be able to walk 7minutes wth out pain or trendelenberg sound.    Status On-going   PT LONG TERM GOAL #3   Title Patient will be able to tolerate sitting >4 hours to perform long car rides.    Status On-going               Plan - 02/09/15 1717    Clinical Impression Statement Continued session focus on improving lumbar spine stabilitzation and hip mobilty with secondary focus on improving foot and ankle stability and strengthening.  Progressed core strengthening with LE ground matrix in plank position for core strengthening with cueing to improve form and stabilty with new exercise.  Added heel raises off step for gastroc strenghtenig with cueing for control.  Ended session with iontophoresis for pain control.     PT Next Visit Plan  continue with iontophoresis to lateral and/or anterior ankle aspect of Rt foot, increase resistance to blue tband for sumo walks and progress to cable tower as able.  Continue heel raises off incline and increase hold time with planks as tolerated.        Problem List There are no active problems to display for this patient.  [address] [place]; [place] #[address] [phone]   Aldona Lento 02/09/2015, 5:56 PM  [place] [address] [place], [place], [address] Phone: [phone]   Fax:  [phone]

## 2015-02-14 ENCOUNTER — Ambulatory Visit (HOSPITAL_COMMUNITY): Payer: BLUE CROSS/BLUE SHIELD | Admitting: Physical Therapy

## 2015-02-14 DIAGNOSIS — M545 Low back pain: Secondary | ICD-10-CM | POA: Diagnosis not present

## 2015-02-14 DIAGNOSIS — M25651 Stiffness of right hip, not elsewhere classified: Secondary | ICD-10-CM

## 2015-02-14 DIAGNOSIS — R6889 Other general symptoms and signs: Secondary | ICD-10-CM

## 2015-02-14 DIAGNOSIS — R29898 Other symptoms and signs involving the musculoskeletal system: Secondary | ICD-10-CM

## 2015-02-14 DIAGNOSIS — M25649 Stiffness of unspecified hand, not elsewhere classified: Secondary | ICD-10-CM

## 2015-02-14 DIAGNOSIS — R262 Difficulty in walking, not elsewhere classified: Secondary | ICD-10-CM

## 2015-02-14 DIAGNOSIS — M5441 Lumbago with sciatica, right side: Secondary | ICD-10-CM

## 2015-02-14 NOTE — Therapy (Signed)
[place], [place], [address] Phone: [phone]   Fax:  [phone]  Physical Therapy Treatment  Patient Details  Name: Betty Bryan MRN: [id number] Date of Birth: [birth year] Referring Provider:  Crist Infante, MD  Encounter Date: 02/14/2015      PT End of Session - 02/14/15 1842    Visit Number 14   Number of Visits 18   Date for PT Re-Evaluation 02/22/15   Authorization Type BCBS   Authorization Time Period 12/23/14 - 02/22/15   Authorization - Visit Number 14   Authorization - Number of Visits 16   PT Start Time 6759   PT Stop Time 1818   PT Time Calculation (min) 44 min   Activity Tolerance Patient tolerated treatment well   Behavior During Therapy [facility] for tasks assessed/performed      Past Medical History  Diagnosis Date  . Palpitations   . PVC's (premature ventricular contractions)   . Ulcerative colitis   . HTN (hypertension)   . Adrenal insufficiency     Past Surgical History  Procedure Laterality Date  . Nasal sinus surgery    . Hand surgery      There were no vitals filed for this visit.  Visit Diagnosis:  Right-sided low back pain with right-sided sciatica  Hip stiffness, right  Weakness of both lower extremities  Difficulty walking  Decreased range of motion of finger  Postural instability of trunk      Subjective Assessment - 02/14/15 1738    Subjective Patient notes Lt foot pain todaqym,, htough right foot pain is much improved though slightly continues. Back pain mostly improved though slightly continues.               [facility] Adult PT Treatment/Exercise - 02/14/15 0001    Lumbar Exercises: Standing   Heel Raises Limitations 3D ankle excursions on floor. 10x   Functional Squats Limitations 4 way pick up and reach 10x 8lb dumbbell. Wide based squats neutral and split stance with 3 way overhead reach with 3lb dumbbells.    Forward Lunge Limitations Lunge matrix  common 5x each    Side Lunge Limitations Sumo/reverse monstr walk walk with Blue Tband 2RT   Row Limitations Cybex cables: 5 plates 3x 5 reps Row with Right; Punch with Left 2x 5 4 plates   Other Standing Lumbar Exercises overhead press Rt UE 5lb 20x, Rt sidebending to neutral 10lb 20x   Other Standing Lumbar Exercises LE ground matrix in plank position on side of mat 10x each   Iontophoresis   Type of Iontophoresis Dexamethasone   Location Rt lateral foot   Dose 4.0   Time 28min             PT Short Term Goals - 02/09/15 1722    PT SHORT TERM GOAL #1   Title Patient will display equal rotation through lumbar spine of 80 degrees Lt ad Rt indicating improved posture   Status On-going   PT SHORT TERM GOAL #2   Title Patient will display equal sidebending through lumbar spine indicatign improved posture   Status Achieved   PT SHORT TERM GOAL #3   Title patient will dmeosntrate hip alignement within normla limits indicating improved hip posture.    Status On-going   PT SHORT TERM GOAL #4   Title Patient will demonstrate 4/5 MMT abdominal strength indicating improved trunk stability.    Status On-going   PT SHORT TERM GOAL #5  Title Patient will be independent with HEP.    Status Achieved           PT Long Term Goals - 02/09/15 1723    PT LONG TERM GOAL #1   Title Patient will demonstrate 5/5 MMT abdominal strength indicating improved trunk stability to maintain normal hip alignement between sessions.    Status On-going   PT LONG TERM GOAL #2   Title Patient will demosntrate glut strength of 4+/5 MMT to be able to walk 74minutes wth out pain or trendelenberg sound.    Status On-going   PT LONG TERM GOAL #3   Title Patient will be able to tolerate sitting >4 hours to perform long car rides.    Status On-going               Plan - 02/14/15 1843    Clinical Impression Statement Progressed resistance of stabilization exercises for lumbar spine and hips with  discharge of stretches to HEP. Patient dispalsyed good responsse with decreased pain and improved coordination, wide based squats added to increase groin and pelvic floor stability to improve trunk stability.    PT Next Visit Plan  continue with iontophoresis to lateral and/or anterior ankle aspect of Rt foot, increase resistance to blue tband for sumo walks and progress to cable tower as able.  Continue heel raises off incline and increase hold time with planks as tolerated.        Problem List There are no active problems to display for this patient.   Devona Konig PT DPT [place] [address] [place], [place], [address] Phone: [phone]   Fax:  [phone]

## 2015-02-15 ENCOUNTER — Telehealth (HOSPITAL_COMMUNITY): Payer: Self-pay | Admitting: Physical Therapy

## 2015-02-15 NOTE — Telephone Encounter (Signed)
She has a business meeting she must attend and she wanted to tell Cash best of luck on his new adventure. He will be missed by all.

## 2015-02-16 ENCOUNTER — Encounter (HOSPITAL_COMMUNITY): Payer: BLUE CROSS/BLUE SHIELD

## 2015-02-21 ENCOUNTER — Telehealth (HOSPITAL_COMMUNITY): Payer: Self-pay

## 2015-02-21 ENCOUNTER — Encounter (HOSPITAL_COMMUNITY): Payer: BLUE CROSS/BLUE SHIELD | Admitting: Physical Therapy

## 2015-02-21 NOTE — Telephone Encounter (Signed)
5/31 called to make sure that she knew the 5/31 appt was cancelled.  She goes back to her dr. On 6/1 and if he wants her to continue therapy she will give [place] a call.  She is ok with not coming in today.

## 2015-03-15 ENCOUNTER — Other Ambulatory Visit: Payer: Self-pay | Admitting: Internal Medicine

## 2015-03-15 DIAGNOSIS — M545 Low back pain: Secondary | ICD-10-CM

## 2015-03-17 ENCOUNTER — Ambulatory Visit
Admission: RE | Admit: 2015-03-17 | Discharge: 2015-03-17 | Disposition: A | Discharge status: Home / Self Care | Payer: BLUE CROSS/BLUE SHIELD | Source: Ambulatory Visit | Attending: Internal Medicine | Admitting: Internal Medicine

## 2015-03-17 ENCOUNTER — Other Ambulatory Visit: Payer: Self-pay

## 2015-03-17 DIAGNOSIS — I8310 Varicose veins of unspecified lower extremity with inflammation: Secondary | ICD-10-CM

## 2015-03-17 DIAGNOSIS — M79605 Pain in left leg: Secondary | ICD-10-CM

## 2015-03-17 DIAGNOSIS — M545 Low back pain: Secondary | ICD-10-CM

## 2015-04-28 ENCOUNTER — Ambulatory Visit (HOSPITAL_COMMUNITY)
Admission: RE | Admit: 2015-04-28 | Discharge: 2015-04-28 | Disposition: A | Discharge status: Home / Self Care | Payer: BLUE CROSS/BLUE SHIELD | Source: Ambulatory Visit | Attending: Internal Medicine | Admitting: Internal Medicine

## 2015-06-01 ENCOUNTER — Ambulatory Visit (HOSPITAL_COMMUNITY): Admission: RE | Admit: 2015-06-01 | Payer: BLUE CROSS/BLUE SHIELD | Source: Ambulatory Visit

## 2015-06-01 NOTE — Therapy (Addendum)
[facility] [address] [place], [place], [address] Phone: [phone]   Fax:  [phone]  Patient Details  Name: Betty Bryan MRN: [id number] Date of Birth: 01/14/1956 Referring Provider:  Perini, Mark, MD  Encounter Date: 06/01/2015  PHYSICAL THERAPY DISCHARGE SUMMARY  Visits from Start of Care: 14  Current functional level related to goals / functional outcomes:  Patient has not returned since last appointment on 02/14/15.    Remaining deficits: Unable to assess since patient has not returned since last appointment.    Education / Equipment: N/A  Plan: Patient agrees to discharge.  Patient goals were partially met. Patient is being discharged due to not returning since the last visit.  ?????         PT, DPT [phone]  [facility] [address] [place], , [address] Phone: [phone]   Fax:  [phone] 

## 2015-06-02 ENCOUNTER — Encounter (HOSPITAL_COMMUNITY): Payer: BLUE CROSS/BLUE SHIELD

## 2015-06-02 ENCOUNTER — Encounter: Payer: BLUE CROSS/BLUE SHIELD | Admitting: Vascular Surgery

## 2015-06-13 ENCOUNTER — Encounter (INDEPENDENT_AMBULATORY_CARE_PROVIDER_SITE_OTHER): Payer: Self-pay

## 2015-06-13 ENCOUNTER — Other Ambulatory Visit (HOSPITAL_COMMUNITY): Payer: Self-pay | Admitting: Internal Medicine

## 2015-06-13 ENCOUNTER — Encounter (HOSPITAL_COMMUNITY): Payer: Self-pay

## 2015-06-13 ENCOUNTER — Ambulatory Visit (HOSPITAL_COMMUNITY)
Admission: RE | Admit: 2015-06-13 | Discharge: 2015-06-13 | Disposition: A | Discharge status: Home / Self Care | Payer: BLUE CROSS/BLUE SHIELD | Source: Ambulatory Visit | Attending: Internal Medicine | Admitting: Internal Medicine

## 2015-06-13 DIAGNOSIS — M810 Age-related osteoporosis without current pathological fracture: Secondary | ICD-10-CM | POA: Insufficient documentation

## 2015-06-13 MED ORDER — DENOSUMAB 60 MG/ML ~~LOC~~ SOLN
60.0000 mg | Freq: Once | SUBCUTANEOUS | Status: AC
  Administered 2015-06-13: 60 mg via SUBCUTANEOUS
  Filled 2015-06-13: qty 1

## 2015-06-13 NOTE — Progress Notes (Signed)
prolia given today, d/c instructions regarding prolia given to pt.  Next appointment is December 18, 2015.  Pt is taking vitamin d and calcium.

## 2015-06-13 NOTE — Discharge Instructions (Signed)
Denosumab injection What is this medicine? DENOSUMAB (den [place] sue mab) slows bone breakdown. Prolia is used to treat osteoporosis in women after menopause and in men. Xgeva is used to prevent bone fractures and other bone problems caused by cancer bone metastases. Xgeva is also used to treat giant cell tumor of the bone. This medicine may be used for other purposes; ask your health care provider or pharmacist if you have questions. COMMON BRAND NAME(S): Prolia, XGEVA What should I tell my health care provider before I take this medicine? They need to know if you have any of these conditions: -dental disease -eczema -infection or history of infections -kidney disease or on dialysis -low blood calcium or vitamin D -malabsorption syndrome -scheduled to have surgery or tooth extraction -taking medicine that contains denosumab -thyroid or parathyroid disease -an unusual reaction to denosumab, other medicines, foods, dyes, or preservatives -pregnant or trying to get pregnant -breast-feeding How should I use this medicine? This medicine is for injection under the skin. It is given by a health care professional in a hospital or clinic setting. If you are getting Prolia, a special MedGuide will be given to you by the pharmacist with each prescription and refill. Be sure to read this information carefully each time. For Prolia, talk to your pediatrician regarding the use of this medicine in children. Special care may be needed. For Xgeva, talk to your pediatrician regarding the use of this medicine in children. While this drug may be prescribed for children as young as 13 years for selected conditions, precautions do apply. Overdosage: If you think you've taken too much of this medicine contact a poison control center or emergency room at once. Overdosage: If you think you have taken too much of this medicine contact a poison control center or emergency room at once. NOTE: This medicine is only for  you. Do not share this medicine with others. What if I miss a dose? It is important not to miss your dose. Call your doctor or health care professional if you are unable to keep an appointment. What may interact with this medicine? Do not take this medicine with any of the following medications: -other medicines containing denosumab This medicine may also interact with the following medications: -medicines that suppress the immune system -medicines that treat cancer -steroid medicines like prednisone or cortisone This list may not describe all possible interactions. Give your health care provider a list of all the medicines, herbs, non-prescription drugs, or dietary supplements you use. Also tell them if you smoke, drink alcohol, or use illegal drugs. Some items may interact with your medicine. What should I watch for while using this medicine? Visit your doctor or health care professional for regular checks on your progress. Your doctor or health care professional may order blood tests and other tests to see how you are doing. Call your doctor or health care professional if you get a cold or other infection while receiving this medicine. Do not treat yourself. This medicine may decrease your body's ability to fight infection. You should make sure you get enough calcium and vitamin D while you are taking this medicine, unless your doctor tells you not to. Discuss the foods you eat and the vitamins you take with your health care professional. See your dentist regularly. Brush and floss your teeth as directed. Before you have any dental work done, tell your dentist you are receiving this medicine. Do not become pregnant while taking this medicine or for 5 months after stopping   it. Women should inform their doctor if they wish to become pregnant or think they might be pregnant. There is a potential for serious side effects to an unborn child. Talk to your health care professional or pharmacist for more  information. What side effects may I notice from receiving this medicine? Side effects that you should report to your doctor or health care professional as soon as possible: -allergic reactions like skin rash, itching or hives, swelling of the face, lips, or tongue -breathing problems -chest pain -fast, irregular heartbeat -feeling faint or lightheaded, falls -fever, chills, or any other sign of infection -muscle spasms, tightening, or twitches -numbness or tingling -skin blisters or bumps, or is dry, peels, or red -slow healing or unexplained pain in the mouth or jaw -unusual bleeding or bruising Side effects that usually do not require medical attention (Report these to your doctor or health care professional if they continue or are bothersome.): -muscle pain -stomach upset, gas This list may not describe all possible side effects. Call your doctor for medical advice about side effects. You may report side effects to FDA at 1-800-FDA-1088. Where should I keep my medicine? This medicine is only given in a clinic, doctor's office, or other health care setting and will not be stored at home. NOTE: This sheet is a summary. It may not cover all possible information. If you have questions about this medicine, talk to your doctor, pharmacist, or health care provider.  2015, [employer]vier/Gold Standard. (2012-03-09 12:37:47)  

## 2015-06-21 ENCOUNTER — Encounter: Payer: Self-pay | Admitting: Vascular Surgery

## 2015-06-22 ENCOUNTER — Encounter: Payer: Self-pay | Admitting: Vascular Surgery

## 2015-06-23 ENCOUNTER — Encounter: Payer: Self-pay | Admitting: Vascular Surgery

## 2015-06-23 ENCOUNTER — Ambulatory Visit (INDEPENDENT_AMBULATORY_CARE_PROVIDER_SITE_OTHER): Payer: BLUE CROSS/BLUE SHIELD | Admitting: Vascular Surgery

## 2015-06-23 ENCOUNTER — Ambulatory Visit (HOSPITAL_COMMUNITY)
Admission: RE | Admit: 2015-06-23 | Discharge: 2015-06-23 | Disposition: A | Discharge status: Home / Self Care | Payer: BLUE CROSS/BLUE SHIELD | Source: Ambulatory Visit | Attending: Vascular Surgery | Admitting: Vascular Surgery

## 2015-06-23 VITALS — BP 132/72 | HR 76 | Ht 62.0 in | Wt 142.5 lb

## 2015-06-23 DIAGNOSIS — I10 Essential (primary) hypertension: Secondary | ICD-10-CM | POA: Insufficient documentation

## 2015-06-23 DIAGNOSIS — I8310 Varicose veins of unspecified lower extremity with inflammation: Secondary | ICD-10-CM

## 2015-06-23 DIAGNOSIS — M79605 Pain in left leg: Secondary | ICD-10-CM

## 2015-06-23 DIAGNOSIS — I83893 Varicose veins of bilateral lower extremities with other complications: Secondary | ICD-10-CM

## 2015-06-23 DIAGNOSIS — I83811 Varicose veins of right lower extremities with pain: Secondary | ICD-10-CM | POA: Insufficient documentation

## 2015-06-23 DIAGNOSIS — I872 Venous insufficiency (chronic) (peripheral): Secondary | ICD-10-CM | POA: Diagnosis not present

## 2015-06-23 DIAGNOSIS — I83899 Varicose veins of unspecified lower extremities with other complications: Secondary | ICD-10-CM | POA: Insufficient documentation

## 2015-06-23 NOTE — Progress Notes (Signed)
Referred by:  Crist Infante, MD [address] [place], [place] [address]  Reason for referral: bilateral leg heaviness and aching   History of Present Illness  Betty Bryan is a 59 y.o. (1956-06-19) female who presents with chief complaint: bilateral leg heaviness and aching.  This patient has a longstanding history of ulcerative colitis requiring chronic steroid use.  Her PCP felt that she might have some PAD and screening test were obtained which did not demonstrate significant disease.  She was then refer to [place] for evaluation for CVI.  Patient notes, she has longstanding history of varicose veins with mild swelling periodically in her legs.  She notes heavilness and weakness with some muscular aching in his thigh and calf muscle even at rest.  She denies any burning or anesthesia.  The patient has had no history of DVT, no history of pregnancy, known history of varicose vein, no history of venous stasis ulcers, no history of  Lymphedema and known history of skin changes in lower legs.  There is no family history of venous disorders.  The patient has never used compression stockings in the past.   Past Medical History  Diagnosis Date  . Palpitations   . PVC's (premature ventricular contractions)   . Ulcerative colitis   . HTN (hypertension)   . Adrenal insufficiency     Past Surgical History  Procedure Laterality Date  . Nasal sinus surgery    . Hand surgery      Social History   Social History  . Marital Status: Divorced    Spouse Name: N/A  . Number of Children: 0  . Years of Education: N/A   Occupational History  . sales rep    Social History Main Topics  . Smoking status: Never Smoker   . Smokeless tobacco: Not on file  . Alcohol Use: No  . Drug Use: No  . Sexual Activity: Not on file   Other Topics Concern  . Not on file   Social History Narrative    Family History  Problem Relation Age of Onset  . Heart failure    . Diabetes Father   . Heart disease  Father   . Hypertension Father     Current Outpatient Prescriptions  Medication Sig Dispense Refill  . atenolol (TENORMIN) 50 MG tablet Take 25 mg by mouth daily.    . B Complex-Biotin-FA (B-COMPLEX PO) Take by mouth.    . balsalazide (COLAZAL) 750 MG capsule Take 2,250 mg by mouth 3 (three) times daily.    . calcium carbonate (OSCAL) 1500 (600 CA) MG TABS tablet Take 600 mg of elemental calcium by mouth 2 (two) times daily with a meal.    . cholecalciferol (VITAMIN D) 1000 UNITS tablet Take 400 Units by mouth daily.    . Denosumab (PROLIA Pepin) Inject into the skin every 6 (six) months.    . diazepam (VALIUM) 5 MG tablet Take 2.5 mg by mouth daily.     . fluticasone (FLONASE) 50 MCG/ACT nasal spray Place 1 spray into both nostrils daily.     Marland Kitchen loratadine (CLARITIN) 10 MG tablet Take 10 mg by mouth daily.    . Multiple Vitamins-Minerals (ZINC PO) Take by mouth.    Marland Kitchen POTASSIUM PO Take by mouth.    . predniSONE (DELTASONE) 1 MG tablet Take 3 mg by mouth daily with breakfast.    . predniSONE (DELTASONE) 5 MG tablet Take 5 mg by mouth daily with breakfast.    . doxycycline (  VIBRAMYCIN) 100 MG capsule     . HYDROCORTISONE ACE, RECTAL, 30 MG SUPP     . hyoscyamine (LEVSIN, ANASPAZ) 0.125 MG tablet     . levofloxacin (LEVAQUIN) 500 MG tablet Take 1 tablet (500 mg total) by mouth daily. (Patient not taking: Reported on 06/23/2015) 7 tablet 0   No current facility-administered medications for this visit.    Allergies  Allergen Reactions  . Penicillins Anaphylaxis  . Sulfa Antibiotics Anaphylaxis     REVIEW OF SYSTEMS:  (Positives checked otherwise negative)  CARDIOVASCULAR:   [ ]  chest pain,  [ ]  chest pressure,  [ ]  palpitations,  [ ]  shortness of breath when laying flat,  [ ]  shortness of breath with exertion,   [x]  pain in feet when walking,  [x]  pain in feet when laying flat, [ ]  history of blood clot in veins (DVT),  [ ]  history of phlebitis,  [ ]  swelling in legs,  [ ]   varicose veins  PULMONARY:   [ ]  productive cough,  [ ]  asthma,  [ ]  wheezing  NEUROLOGIC:   [x]  weakness in arms or legs,  [ ]  numbness in arms or legs,  [ ]  difficulty speaking or slurred speech,  [ ]  temporary loss of vision in one eye,  [ ]  dizziness  HEMATOLOGIC:   [ ]  bleeding problems,  [ ]  problems with blood clotting too easily  MUSCULOSKEL:   [ ]  joint pain, [ ]  joint swelling  GASTROINTEST:   [ ]  vomiting blood,  [ ]  blood in stool     GENITOURINARY:   [ ]  burning with urination,  [ ]  blood in urine  PSYCHIATRIC:   [ ]  history of major depression  INTEGUMENTARY:   [ ]  rashes,  [ ]  ulcers  CONSTITUTIONAL:   [ ]  fever,  [ ]  chills   Physical Examination  Filed Vitals:   06/23/15 1030  BP: 132/72  Pulse: 76  Height: 5\' 2"  (1.575 m)  Weight: 142 lb 8 oz (64.638 kg)  SpO2: 99%   Body mass index is 26.06 kg/(m^2).  General: A&O x 3, WDWN  Head: [place]/AT  Ear/Nose/Throat: Hearing grossly intact, nares without erythema or drainage, oropharynx without Erythema/Exudate, Mallampati score: 3  Eyes: PERRLA, EOMI  Neck: Supple, no nuchal rigidity, no palpable LAD  Pulmonary: Sym exp, good air movt, CTAB, no rales, rhonchi, & wheezing  Cardiac: RRR, Nl S1, S2, no Murmurs, rubs or gallops  Vascular: Vessel Right Left  Radial Palpable Palpable  Brachial Palpable Palpable  Carotid Palpable, without bruit Palpable, without bruit  Aorta Not palpable N/A  Femoral Palpable Palpable  Popliteal Not palpable Not palpable  PT Palpable Palpable  DP Palpable Palpable   Gastrointestinal: soft, NTND, no G/R, no HSM, no masses, no CVAT B  Musculoskeletal: M/S 5/5 throughout , Extremities without ischemic changes , mild B LDS, no edema, B extensive spider veins, cyanotic feet  Neurologic: CN 2-12 intact , Pain and light touch intact in extremities , Motor exam as listed above  Psychiatric: Judgment intact, Mood & affect appropriate for pt's clinical  situation  Dermatologic: See M/S exam for extremity exam, no rashes otherwise noted  Lymph : No Cervical, Axillary, or Inguinal lymphadenopathy    Non-Invasive Vascular Imaging  BLE Venous Insufficiency Duplex (Date: 06/23/2015):   RLE:   no DVT and SVT,   no GSV reflux,   no SSV reflux,  + deep venous reflux: SFV, SFJ  LLE:  no DVT and SVT,  no GSV reflux,   no SSV reflux,  no deep venous reflux   Outside Studies/Documentation 4 pages of outside documents were reviewed including: outpatient PCP chart, outpatient arterial duplex   Medical Decision Making  Betty Bryan is a 59 y.o. female who presents with: RLE chronic venous insufficiency (C2), BLE varicose veins with complications   I doubt this patient's sx are due to either PAD or her CVI.  Her venous reflux is relatively limited and her pulses are palpable.  Based on the patient's history and examination, I recommend: OTC compression RLE.  Thank you for allowing [place] to participate in this patient's care.   Adele Barthel, MD [facility] Office: [phone] Pager: [phone]  06/23/2015, 12:42 PM

## 2015-07-10 ENCOUNTER — Other Ambulatory Visit: Payer: Self-pay | Admitting: Gastroenterology

## 2015-08-02 NOTE — Telephone Encounter (Signed)
Called concerning apt date and time  Casey Cockerham, LPTA; CBIS [phone]  

## 2015-08-02 NOTE — Telephone Encounter (Signed)
Called concerning apt date and time  Sandon Yoho, LPTA; CBIS [phone]  

## 2015-12-18 ENCOUNTER — Ambulatory Visit (HOSPITAL_COMMUNITY)
Admission: RE | Admit: 2015-12-18 | Discharge: 2015-12-18 | Disposition: A | Discharge status: Home / Self Care | Payer: BLUE CROSS/BLUE SHIELD | Source: Ambulatory Visit | Attending: Internal Medicine | Admitting: Internal Medicine

## 2016-01-17 ENCOUNTER — Ambulatory Visit (HOSPITAL_COMMUNITY)
Admission: RE | Admit: 2016-01-17 | Discharge: 2016-01-17 | Disposition: A | Discharge status: Home / Self Care | Payer: BLUE CROSS/BLUE SHIELD | Source: Ambulatory Visit | Attending: Internal Medicine | Admitting: Internal Medicine

## 2016-01-17 ENCOUNTER — Encounter (HOSPITAL_COMMUNITY): Payer: Self-pay

## 2016-01-17 DIAGNOSIS — M810 Age-related osteoporosis without current pathological fracture: Secondary | ICD-10-CM | POA: Insufficient documentation

## 2016-01-17 MED ORDER — DENOSUMAB 60 MG/ML ~~LOC~~ SOLN
60.0000 mg | Freq: Once | SUBCUTANEOUS | Status: AC
  Administered 2016-01-17: 60 mg via SUBCUTANEOUS
  Filled 2016-01-17: qty 1

## 2016-01-17 NOTE — Discharge Instructions (Signed)
Denosumab injection  What is this medicine?  DENOSUMAB (den oh sue mab) slows bone breakdown. Prolia is used to treat osteoporosis in women after menopause and in men. Xgeva is used to prevent bone fractures and other bone problems caused by cancer bone metastases. Xgeva is also used to treat giant cell tumor of the bone.  This medicine may be used for other purposes; ask your health care provider or pharmacist if you have questions.  What should I tell my health care provider before I take this medicine?  They need to know if you have any of these conditions:  -dental disease  -eczema  -infection or history of infections  -kidney disease or on dialysis  -low blood calcium or vitamin D  -malabsorption syndrome  -scheduled to have surgery or tooth extraction  -taking medicine that contains denosumab  -thyroid or parathyroid disease  -an unusual reaction to denosumab, other medicines, foods, dyes, or preservatives  -pregnant or trying to get pregnant  -breast-feeding  How should I use this medicine?  This medicine is for injection under the skin. It is given by a health care professional in a hospital or clinic setting.  If you are getting Prolia, a special MedGuide will be given to you by the pharmacist with each prescription and refill. Be sure to read this information carefully each time.  For Prolia, talk to your pediatrician regarding the use of this medicine in children. Special care may be needed. For Xgeva, talk to your pediatrician regarding the use of this medicine in children. While this drug may be prescribed for children as young as 13 years for selected conditions, precautions do apply.  Overdosage: If you think you have taken too much of this medicine contact a poison control center or emergency room at once.  NOTE: This medicine is only for you. Do not share this medicine with others.  What if I miss a dose?  It is important not to miss your dose. Call your doctor or health care professional if you are  unable to keep an appointment.  What may interact with this medicine?  Do not take this medicine with any of the following medications:  -other medicines containing denosumab  This medicine may also interact with the following medications:  -medicines that suppress the immune system  -medicines that treat cancer  -steroid medicines like prednisone or cortisone  This list may not describe all possible interactions. Give your health care provider a list of all the medicines, herbs, non-prescription drugs, or dietary supplements you use. Also tell them if you smoke, drink alcohol, or use illegal drugs. Some items may interact with your medicine.  What should I watch for while using this medicine?  Visit your doctor or health care professional for regular checks on your progress. Your doctor or health care professional may order blood tests and other tests to see how you are doing.  Call your doctor or health care professional if you get a cold or other infection while receiving this medicine. Do not treat yourself. This medicine may decrease your body's ability to fight infection.  You should make sure you get enough calcium and vitamin D while you are taking this medicine, unless your doctor tells you not to. Discuss the foods you eat and the vitamins you take with your health care professional.  See your dentist regularly. Brush and floss your teeth as directed. Before you have any dental work done, tell your dentist you are receiving this medicine.  Do   not become pregnant while taking this medicine or for 5 months after stopping it. Women should inform their doctor if they wish to become pregnant or think they might be pregnant. There is a potential for serious side effects to an unborn child. Talk to your health care professional or pharmacist for more information.  What side effects may I notice from receiving this medicine?  Side effects that you should report to your doctor or health care professional as soon as  possible:  -allergic reactions like skin rash, itching or hives, swelling of the face, lips, or tongue  -breathing problems  -chest pain  -fast, irregular heartbeat  -feeling faint or lightheaded, falls  -fever, chills, or any other sign of infection  -muscle spasms, tightening, or twitches  -numbness or tingling  -skin blisters or bumps, or is dry, peels, or red  -slow healing or unexplained pain in the mouth or jaw  -unusual bleeding or bruising  Side effects that usually do not require medical attention (Report these to your doctor or health care professional if they continue or are bothersome.):  -muscle pain  -stomach upset, gas  This list may not describe all possible side effects. Call your doctor for medical advice about side effects. You may report side effects to FDA at 1-800-FDA-1088.  Where should I keep my medicine?  This medicine is only given in a clinic, doctor's office, or other health care setting and will not be stored at home.  NOTE: This sheet is a summary. It may not cover all possible information. If you have questions about this medicine, talk to your doctor, pharmacist, or health care provider.      2016, Elsevier/Gold Standard. (2012-03-09 12:37:47)

## 2016-02-05 DIAGNOSIS — M79662 Pain in left lower leg: Secondary | ICD-10-CM | POA: Diagnosis not present

## 2016-02-07 ENCOUNTER — Ambulatory Visit (HOSPITAL_COMMUNITY): Payer: BLUE CROSS/BLUE SHIELD | Attending: Orthopedic Surgery | Admitting: Physical Therapy

## 2016-02-07 DIAGNOSIS — M6281 Muscle weakness (generalized): Secondary | ICD-10-CM | POA: Diagnosis not present

## 2016-02-07 DIAGNOSIS — R2681 Unsteadiness on feet: Secondary | ICD-10-CM

## 2016-02-07 DIAGNOSIS — R262 Difficulty in walking, not elsewhere classified: Secondary | ICD-10-CM | POA: Diagnosis not present

## 2016-02-07 NOTE — Therapy (Signed)
[facility] [address] [place], [place], [address] Phone: [phone]   Fax:  [phone]  Physical Therapy Evaluation  Patient Details  Name: Betty Bryan MRN: [id number] Date of Birth: 1956/05/28 Referring Provider: Meridee Score   Encounter Date: 02/07/2016      PT End of Session - 02/07/16 1312    Visit Number 1   Number of Visits 12   Date for PT Re-Evaluation 02/28/16   Authorization Type [employer]    Authorization Time Period 02/07/16 to 03/20/16   PT Start Time 0906   PT Stop Time 0945   PT Time Calculation (min) 39 min   Activity Tolerance Patient tolerated treatment well   Behavior During Therapy [facility] for tasks assessed/performed      Past Medical History  Diagnosis Date  . Palpitations   . PVC's (premature ventricular contractions)   . Ulcerative colitis   . HTN (hypertension)   . Adrenal insufficiency [facility])     Past Surgical History  Procedure Laterality Date  . Nasal sinus surgery    . Hand surgery      There were no vitals filed for this visit.       Subjective Assessment - 02/07/16 0908    Subjective Patient reports taht she has had problems with her legs for years now, her current issue is defintiely a continuation of problems she has had for awhile. No numbness or tingling going anywhere. Walking really aggravates, and she also had a flareup aftter she had to pick up a lot of sticsk i her yard.She does not stand very well or walk for very long for extended distances for either. 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  Reports taht pain goes down into her leg and foot, no history of back problems.    Pertinent History PVCs, HTN, adrenal insufficiency, chronic steroid use due to PMH conditions (including steriod myopathy per patient)   How long can  you sit comfortably? 30 minutes    How long can you stand comfortably? 10 minutes    How long can you walk comfortably? 5-10 minutes    Patient Stated Goals be able to do more activies in general    Currently in Pain? Yes   Pain Score 5    Pain Location Leg   Pain Orientation Left;Lateral;Anterior   Pain Descriptors / Indicators Sore;Spasm   Pain Type Chronic pain   Pain Radiating Towards runs down L LE    Pain Onset More than a month ago   Pain Frequency Constant   Aggravating Factors  walking   Pain Relieving Factors reports that when she supports her core with her hands, she does notcie a big difference    Effect of Pain on Daily Activities cannot do a lot of activities             [facility] PT Assessment - 02/07/16 0001    Assessment   Medical Diagnosis tibialais anterior and quad atrophy and pain    Referring Provider Meridee Score    Onset Date/Surgical Date --  chronic    Next MD Visit June 5th with Dr. Sharol Given    Precautions   Precaution Comments chronic medicinal steroid use; weak bones due to steriods (no aggressive mobs or manipulations)   Balance Screen  Has the patient fallen in the past 6 months No   Has the patient had a decrease in activity level because of a fear of falling?  Yes   Is the patient reluctant to leave their home because of a fear of falling?  No   Prior Function   Level of Independence Independent;Independent with basic ADLs;Independent with gait;Independent with transfers   Vocation Full time employment   Technical brewer for vet's office, does need to walk a lot though    Leisure keeping up house and yard, antiguing    Observation/Other Assessments   Observations clonus negative bilaterally    AROM   Overall AROM Comments hips appear WFL all planes bilaterally    Lumbar Flexion approx 2-3 inches from floor; no change with repeated movements    Lumbar Extension WFL; no change with repeated motions    Lumbar - Right Side Bend WFL     Lumbar - Left Side Bend [facility]    Strength   Right Hip Flexion 3+/5   Right Hip Extension 3-/5   Right Hip ABduction 3+/5   Left Hip Flexion 3+/5   Left Hip Extension 3/5   Left Hip ABduction 3/5   Right Knee Flexion 3+/5   Right Knee Extension 4-/5   Left Knee Flexion 3/5   Left Knee Extension 3+/5   Right Ankle Dorsiflexion 4+/5   Left Ankle Dorsiflexion 4/5   Flexibility   Hamstrings WLF R, mild tightness L    Piriformis WFL R, mild tightness L    Ambulation/Gait   Gait Comments considerable weakness noted in bilateral anteiror tibialis and quads; proximal muscle weakness; possible reduced proprioception for TKE    6 minute walk test results    Aerobic Endurance Distance Walked 527   Endurance additional comments 3MWT    High Level Balance   High Level Balance Comments TUG 10.46; SLS L 6 seconds R 14 seconds                            PT Education - 02/07/16 1312    Education provided Yes   Education Details prognosis, plan of care, HEP    Person(s) Educated Patient   Methods Explanation;Demonstration;Handout   Comprehension Verbalized understanding;Returned demonstration;Need further instruction          PT Short Term Goals - 02/07/16 1328    PT SHORT TERM GOAL #1   Title Patient will demonstrate improved gait mechancis included improved proprioceptiona nd strength of bilateral LEs during gait as evidenced by improved control of TKE and improved ankle dorsiflexion    Time 3   Period Weeks   Status New   PT SHORT TERM GOAL #2   Title Patient will be able to perform SLS for at least 20 seconds on each LE in order to demonstrate improved balance skills   Time 3   Period Weeks   Status New   PT SHORT TERM GOAL #3   Title Patient will demonstrate improved functional activity tolerance as evidenced by an ability to ambulate for at least 20 minutes without exacerbation of symptoms    Time 3   Period Weeks   Status New   PT SHORT TERM GOAL #4   Title  Patient to be indpeendent in correctly and consistently performing appropriate HEP, to be udpated PRN    Time 3   Period Weeks   Status New  PT Long Term Goals - 02/07/16 1333    PT LONG TERM GOAL #1   Title Patient will demonstrate sterngth 4+/5 in all tested muscles and core in order to demonstrate overall improvement of condition    Time 6   Period Weeks   Status New   PT LONG TERM GOAL #2   Title Patient will demonstrate ability to perform SLS on each LE for at least 40 seconds in order to demonstrate improved balane skills    Time 6   Period Weeks   Status New   PT LONG TERM GOAL #3   Title Patient to be able to ambulate at least 1393ft during 6MWT in order to demonstrate improved functional activity tolerance and ovearll improvment of condition    Time 6   Period Weeks   Status New   PT LONG TERM GOAL #4   Title Patient to be performing regular exercise of light-moderate intenstiy, for at least 20 minutes in duration, 3-4 times per week, in order to maintain functional gains and improve health stauts    Time 6   Period Weeks   Status New               Plan - 02/07/16 1313    Clinical Impression Statement Patient arrives reporting general weakness and pain in her LEs, which she reports has been a chronic problem and emphasizes that she has been on chronic medicinal steroids which is influencing her bone mass, and her MD also believes is causing some myoptathy.  Upon examination, patient reveals significnat muscle weakness, impaired balance, mild soft tissue tightness, and reduced functional task performance skills overall. 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 At this point recommend  skilled PT services in order to address functional limtations and reach optimal level of function.    Rehab Potential Good   PT Frequency 2x / week   PT Duration 6 weeks   PT Treatment/Interventions ADLs/Self Care Home Management;Biofeedback;Cryotherapy;Gait training;Stair training;Functional mobility training;Therapeutic activities;Therapeutic exercise;Balance training;Neuromuscular re-education;Patient/family education;Manual techniques;Energy conservation;Taping   PT Next Visit Plan review HEP and goals; functional strength and balance as tolerated, rest breaks PRN; proprioception for LEs; gait training. Give printout of last sessin's HEP.    PT Home Exercise Plan given    Consulted and Agree with Plan of Care Patient      Patient will benefit from skilled therapeutic intervention in order to improve the following deficits and impairments:  Abnormal gait, Impaired sensation, Decreased activity tolerance, Decreased strength, Pain, Decreased balance, Decreased mobility, Difficulty walking, Improper body mechanics, Decreased coordination, Postural dysfunction, Impaired flexibility  Visit Diagnosis: Muscle weakness (generalized) - Plan: PT plan of care cert/re-cert  Difficulty in walking, not elsewhere classified - Plan: PT plan of care cert/re-cert  Unsteadiness on feet - Plan: PT plan of care cert/re-cert     Problem List Patient Active Problem List   Diagnosis Date Noted  . Varicose veins of leg with complications 0000000  . Chronic venous insufficiency 06/23/2015    Deniece Ree PT, DPT [facility] [address] [place], [place], [address] Phone: [phone]   Fax:  [phone]  Name: Betty Bryan MRN: [id number] Date of Birth: 09-Aug-1956

## 2016-02-07 NOTE — Patient Instructions (Signed)
   BRIDGING  While lying on your back, tighten your lower abdominals, squeeze your buttocks and then raise your buttocks off the floor/bed as creating a "Bridge" with your body. Hold and then lower yourself and repeat.  Repeat 10 times, twice a day.    Transverse Abdominus Activation  In any position that is comfortable to you, pull your bellybutton into your spine. Hold for 2 seconds, then relax.  Repeat 20 times, 3-5 times a day.   Knee Extension: Sit to Stand (Eccentric)    Stand close to chair. Slowly lower self to seated position. __5-10_ reps per set, __2_ sets per day, _5__ days per week. Progress to stopping midway before lowering to chair. Progress to barely touching chair.  Copyright  VHI. All rights reserved.

## 2016-02-09 ENCOUNTER — Ambulatory Visit (HOSPITAL_COMMUNITY): Payer: BLUE CROSS/BLUE SHIELD

## 2016-02-09 DIAGNOSIS — R262 Difficulty in walking, not elsewhere classified: Secondary | ICD-10-CM

## 2016-02-09 DIAGNOSIS — R2681 Unsteadiness on feet: Secondary | ICD-10-CM

## 2016-02-09 DIAGNOSIS — M6281 Muscle weakness (generalized): Secondary | ICD-10-CM | POA: Diagnosis not present

## 2016-02-09 NOTE — Therapy (Signed)
[facility] [address] [place], [place], [address] Phone: [phone]   Fax:  [phone]  Physical Therapy Treatment  Patient Details  Name: Betty Bryan MRN: [id number] Date of Birth: Mar 17, 1956 Referring Provider: Meridee Score   Encounter Date: 02/09/2016      PT End of Session - 02/09/16 1358    Visit Number 2   Number of Visits 12   Date for PT Re-Evaluation 02/28/16   Authorization Type BCBS    Authorization Time Period 02/07/16 to 03/20/16   PT Start Time 1352   PT Stop Time 1430   PT Time Calculation (min) 38 min   Activity Tolerance Patient tolerated treatment well   Behavior During Therapy [facility] for tasks assessed/performed      Past Medical History  Diagnosis Date  . Palpitations   . PVC's (premature ventricular contractions)   . Ulcerative colitis   . HTN (hypertension)   . Adrenal insufficiency [facility])     Past Surgical History  Procedure Laterality Date  . Nasal sinus surgery    . Hand surgery      There were no vitals filed for this visit.      Subjective Assessment - 02/09/16 1356    Subjective Pt stated she had pinched pain posterior Lt LE pain from mid hamstring, posterior calf and ankle pain.     Pertinent History PVCs, HTN, adrenal insufficiency, chronic steroid use due to PMH conditions (including steriod myopathy per patient)   Patient Stated Goals be able to do more activies in general    Currently in Pain? Yes   Pain Score 6    Pain Location Leg   Pain Orientation Posterior;Lateral;Left   Pain Descriptors / Indicators --  pinching   Pain Type Chronic pain   Pain Radiating Towards Runs down Lt LE   Pain Onset More than a month ago   Pain Frequency Constant   Aggravating Factors  walking   Pain Relieving Factors reports that when she supports her core with her hands, she does notcie a big difference   Effect of Pain on Daily Activities cannot do a lot of activities             [facility]  Adult PT Treatment/Exercise - 02/09/16 0001    Posture/Postural Control   Posture/Postural Control Postural limitations   Postural Limitations Rounded Shoulders;Forward head   Exercises   Exercises Lumbar   Lumbar Exercises: Standing   Functional Squats 10 reps   Functional Squats Limitations therapist facilitatin   Other Standing Lumbar Exercises SLS Rt 15", Lt 20"   Lumbar Exercises: Supine   Ab Set 10 reps;5 seconds   AB Set Limitations therapist facilitation   Bent Knee Raise 10 reps;5 seconds   Bridge 15 reps   Other Supine Lumbar Exercises cervical and scapular retraction 10x 5"   Lumbar Exercises: Sidelying   Hip Abduction 10 reps   Hip Abduction Limitations cueing for form                  PT Short Term Goals - 02/07/16 1328    PT SHORT TERM GOAL #1   Title Patient will demonstrate improved gait mechancis included improved proprioceptiona nd strength of bilateral LEs during gait as evidenced by improved control of TKE and improved ankle dorsiflexion    Time 3   Period Weeks   Status New   PT SHORT TERM GOAL #2   Title Patient will be able to perform SLS  for at least 20 seconds on each LE in order to demonstrate improved balance skills   Time 3   Period Weeks   Status New   PT SHORT TERM GOAL #3   Title Patient will demonstrate improved functional activity tolerance as evidenced by an ability to ambulate for at least 20 minutes without exacerbation of symptoms    Time 3   Period Weeks   Status New   PT SHORT TERM GOAL #4   Title Patient to be indpeendent in correctly and consistently performing appropriate HEP, to be udpated PRN    Time 3   Period Weeks   Status New           PT Long Term Goals - 02/07/16 1333    PT LONG TERM GOAL #1   Title Patient will demonstrate sterngth 4+/5 in all tested muscles and core in order to demonstrate overall improvement of condition    Time 6   Period Weeks   Status New   PT LONG TERM GOAL #2   Title Patient  will demonstrate ability to perform SLS on each LE for at least 40 seconds in order to demonstrate improved balane skills    Time 6   Period Weeks   Status New   PT LONG TERM GOAL #3   Title Patient to be able to ambulate at least 1325ft during 6MWT in order to demonstrate improved functional activity tolerance and ovearll improvment of condition    Time 6   Period Weeks   Status New   PT LONG TERM GOAL #4   Title Patient to be performing regular exercise of light-moderate intenstiy, for at least 20 minutes in duration, 3-4 times per week, in order to maintain functional gains and improve health stauts    Time 6   Period Weeks   Status New               Plan - 02/09/16 1415    Clinical Impression Statement Reviewed goals, instructed proper technqiue with HEP and copy of exercises given as well as copy of eval.  Session focus on improving core and proximal musculature strengthening with therapist facilitation to improve core activatin and form wtih exercises.  Pt posture limitations include forward head, forward rolled shoulders and trunk flexed, pt educated on importance of improving posture to reduce LE pain symptoms.  End of session pt reports pain reduced to 3/10 wtih improved stance phase and overall gait mechanics.     Rehab Potential Good   PT Frequency 2x / week   PT Duration 6 weeks   PT Treatment/Interventions ADLs/Self Care Home Management;Biofeedback;Cryotherapy;Gait training;Stair training;Functional mobility training;Therapeutic activities;Therapeutic exercise;Balance training;Neuromuscular re-education;Patient/family education;Manual techniques;Energy conservation;Taping   PT Next Visit Plan Continue with current PT POC to improve, functional strength and balance as tolerated, rest breaks PRN; proprioception for LEs; gait training. Review compliance and technique wiht HEP to assure correct technique and frequency.  Progress to closed chain kinetic exercises next session  for strengthening and balance.   PT Home Exercise Plan Reviewed technqiue and copy of HEP given this session.      Patient will benefit from skilled therapeutic intervention in order to improve the following deficits and impairments:  Abnormal gait, Impaired sensation, Decreased activity tolerance, Decreased strength, Pain, Decreased balance, Decreased mobility, Difficulty walking, Improper body mechanics, Decreased coordination, Postural dysfunction, Impaired flexibility  Visit Diagnosis: Muscle weakness (generalized)  Difficulty in walking, not elsewhere classified  Unsteadiness on feet     Problem List  Patient Active Problem List   Diagnosis Date Noted  . Varicose veins of leg with complications 0000000  . Chronic venous insufficiency 06/23/2015   Ihor Austin, LPTA; [place]  Aldona Lento 02/09/2016, 2:49 PM  [facility] [address] [place], [place], [address] Phone: [phone]   Fax:  [phone]  Name: ERIAL ANGERMEIER MRN: [id number] Date of Birth: 20-Aug-1956

## 2016-02-14 ENCOUNTER — Ambulatory Visit (HOSPITAL_COMMUNITY): Payer: BLUE CROSS/BLUE SHIELD | Admitting: Physical Therapy

## 2016-02-14 DIAGNOSIS — R262 Difficulty in walking, not elsewhere classified: Secondary | ICD-10-CM | POA: Diagnosis not present

## 2016-02-14 DIAGNOSIS — R2681 Unsteadiness on feet: Secondary | ICD-10-CM

## 2016-02-14 DIAGNOSIS — M6281 Muscle weakness (generalized): Secondary | ICD-10-CM | POA: Diagnosis not present

## 2016-02-14 NOTE — Therapy (Signed)
[facility] [address] [place], [place], [address] Phone: [phone]   Fax:  [phone]  Physical Therapy Treatment  Patient Details  Name: Betty Bryan MRN: [id number] Date of Birth: October 28, 1955 Referring Provider: Meridee Score   Encounter Date: 02/14/2016      PT End of Session - 02/14/16 1511    Visit Number 3   Number of Visits 12   Date for PT Re-Evaluation 02/28/16   Authorization Type BCBS    Authorization Time Period 02/07/16 to 03/20/16   PT Start Time 1442   PT Stop Time 1520   PT Time Calculation (min) 38 min   Activity Tolerance Patient tolerated treatment well   Behavior During Therapy [facility] for tasks assessed/performed      Past Medical History  Diagnosis Date  . Palpitations   . PVC's (premature ventricular contractions)   . Ulcerative colitis   . HTN (hypertension)   . Adrenal insufficiency [facility])     Past Surgical History  Procedure Laterality Date  . Nasal sinus surgery    . Hand surgery      There were no vitals filed for this visit.      Subjective Assessment - 02/14/16 1451    Subjective Pt states she is really hurting down the backs of her legs and into the lateral Lt foot region.  Currently a 7/10.     Pain Score 7    Pain Location Leg   Pain Orientation Right;Left   Pain Descriptors / Indicators Radiating;Shooting;Throbbing   Pain Radiating Towards increases with weight bearing down both LE's posteriorly   Pain Frequency Intermittent   Aggravating Factors  weight bearing                         [facility] Adult PT Treatment/Exercise - 02/14/16 0001    Lumbar Exercises: Stretches   Piriformis Stretch 2 reps;30 seconds   Piriformis Stretch Limitations seated bilterally   Lumbar Exercises: Standing   Heel Raises 10 reps   Heel Raises Limitations toeraises 10 reps   Functional Squats 15 reps   Forward Lunge 10 reps;5 seconds   Forward Lunge Limitations bilaterally onto 4" box    Other Standing Lumbar Exercises SLS Rt 15", Lt 20"   Other Standing Lumbar Exercises hip extension and abduction 10-reps each   Lumbar Exercises: Supine   Ab Set 10 reps;5 seconds   AB Set Limitations therapist facilitation   Bent Knee Raise 15 reps   Bridge 15 reps   Straight Leg Raise 10 reps   Straight Leg Raises Limitations bilaterally                  PT Short Term Goals - 02/07/16 1328    PT SHORT TERM GOAL #1   Title Patient will demonstrate improved gait mechancis included improved proprioceptiona nd strength of bilateral LEs during gait as evidenced by improved control of TKE and improved ankle dorsiflexion    Time 3   Period Weeks   Status New   PT SHORT TERM GOAL #2   Title Patient will be able to perform SLS for at least 20 seconds on each LE in order to demonstrate improved balance skills   Time 3   Period Weeks   Status New   PT SHORT TERM GOAL #3   Title Patient will demonstrate improved functional activity tolerance as evidenced by an ability to ambulate for at least 20 minutes without exacerbation  of symptoms    Time 3   Period Weeks   Status New   PT SHORT TERM GOAL #4   Title Patient to be indpeendent in correctly and consistently performing appropriate HEP, to be udpated PRN    Time 3   Period Weeks   Status New           PT Long Term Goals - 02/07/16 1333    PT LONG TERM GOAL #1   Title Patient will demonstrate sterngth 4+/5 in all tested muscles and core in order to demonstrate overall improvement of condition    Time 6   Period Weeks   Status New   PT LONG TERM GOAL #2   Title Patient will demonstrate ability to perform SLS on each LE for at least 40 seconds in order to demonstrate improved balane skills    Time 6   Period Weeks   Status New   PT LONG TERM GOAL #3   Title Patient to be able to ambulate at least 1342ft during 6MWT in order to demonstrate improved functional activity tolerance and ovearll improvment of condition     Time 6   Period Weeks   Status New   PT LONG TERM GOAL #4   Title Patient to be performing regular exercise of light-moderate intenstiy, for at least 20 minutes in duration, 3-4 times per week, in order to maintain functional gains and improve health stauts    Time 6   Period Weeks   Status New               Plan - 02/14/16 1512    Clinical Impression Statement Pt was late arriving for appt then had to use restroom.  Reports compliance with HEP and without difficulty.  Added new exercises in standing to increase LE strength and stabiltiy with cues for form.  Pt tends to use UE's to help with LE's due to weakness.  Multimodal cues required.  Reintroduced hamstring and piriformis stretches to help reduce nerve pain, especially down Lt LE.  Pt reported decreased pain following hamstring and piriformis strtches this session.   Rehab Potential Good   PT Frequency 2x / week   PT Duration 6 weeks   PT Treatment/Interventions ADLs/Self Care Home Management;Biofeedback;Cryotherapy;Gait training;Stair training;Functional mobility training;Therapeutic activities;Therapeutic exercise;Balance training;Neuromuscular re-education;Patient/family education;Manual techniques;Energy conservation;Taping   PT Next Visit Plan Continue to progress closed chain exercises next session for strengthening and balance.  Urged pt to continue stretches to help ease her nerve pain.      Patient will benefit from skilled therapeutic intervention in order to improve the following deficits and impairments:  Abnormal gait, Impaired sensation, Decreased activity tolerance, Decreased strength, Pain, Decreased balance, Decreased mobility, Difficulty walking, Improper body mechanics, Decreased coordination, Postural dysfunction, Impaired flexibility  Visit Diagnosis: Muscle weakness (generalized)  Difficulty in walking, not elsewhere classified  Unsteadiness on feet     Problem List Patient Active Problem List    Diagnosis Date Noted  . Varicose veins of leg with complications 0000000  . Chronic venous insufficiency 06/23/2015    Teena Irani, PTA/CLT [phone]  02/14/2016, 3:23 PM  [facility] [address] [place], [place], [address] Phone: [phone]   Fax:  [phone]  Name: LAMYIA MECCIA MRN: [id number] Date of Birth: [birth year]

## 2016-02-16 DIAGNOSIS — R7301 Impaired fasting glucose: Secondary | ICD-10-CM | POA: Diagnosis not present

## 2016-02-16 DIAGNOSIS — I10 Essential (primary) hypertension: Secondary | ICD-10-CM | POA: Diagnosis not present

## 2016-02-16 DIAGNOSIS — M810 Age-related osteoporosis without current pathological fracture: Secondary | ICD-10-CM | POA: Diagnosis not present

## 2016-02-16 DIAGNOSIS — E242 Drug-induced Cushing's syndrome: Secondary | ICD-10-CM | POA: Diagnosis not present

## 2016-02-20 ENCOUNTER — Telehealth (HOSPITAL_COMMUNITY): Payer: Self-pay

## 2016-02-20 NOTE — Telephone Encounter (Signed)
02/20/16 cx appt... Michela Pitcher it was crazy at work and with it being a short work week it's just hard to get here

## 2016-02-21 ENCOUNTER — Encounter (HOSPITAL_COMMUNITY): Payer: BLUE CROSS/BLUE SHIELD

## 2016-02-21 DIAGNOSIS — M5442 Lumbago with sciatica, left side: Secondary | ICD-10-CM | POA: Diagnosis not present

## 2016-02-21 DIAGNOSIS — M9902 Segmental and somatic dysfunction of thoracic region: Secondary | ICD-10-CM | POA: Diagnosis not present

## 2016-02-21 DIAGNOSIS — M9903 Segmental and somatic dysfunction of lumbar region: Secondary | ICD-10-CM | POA: Diagnosis not present

## 2016-02-21 DIAGNOSIS — M9905 Segmental and somatic dysfunction of pelvic region: Secondary | ICD-10-CM | POA: Diagnosis not present

## 2016-02-23 ENCOUNTER — Encounter (HOSPITAL_COMMUNITY): Payer: BLUE CROSS/BLUE SHIELD

## 2016-02-26 ENCOUNTER — Encounter (HOSPITAL_COMMUNITY): Payer: BLUE CROSS/BLUE SHIELD | Admitting: Physical Therapy

## 2016-02-27 ENCOUNTER — Telehealth (HOSPITAL_COMMUNITY): Payer: Self-pay

## 2016-02-27 NOTE — Telephone Encounter (Signed)
02/27/16 patient left a message to cx because she is having other health issues

## 2016-02-28 ENCOUNTER — Ambulatory Visit (HOSPITAL_COMMUNITY): Payer: BLUE CROSS/BLUE SHIELD | Admitting: Physical Therapy

## 2016-03-04 ENCOUNTER — Ambulatory Visit (HOSPITAL_COMMUNITY): Payer: BLUE CROSS/BLUE SHIELD | Admitting: Physical Therapy

## 2016-03-04 ENCOUNTER — Telehealth (HOSPITAL_COMMUNITY): Payer: Self-pay | Admitting: Physical Therapy

## 2016-03-04 NOTE — Telephone Encounter (Signed)
Requested to be d/c she had some other health issues to come up.

## 2016-03-06 ENCOUNTER — Ambulatory Visit (HOSPITAL_COMMUNITY): Payer: BLUE CROSS/BLUE SHIELD | Admitting: Physical Therapy

## 2016-03-11 ENCOUNTER — Encounter (HOSPITAL_COMMUNITY): Payer: BLUE CROSS/BLUE SHIELD | Admitting: Physical Therapy

## 2016-03-13 ENCOUNTER — Encounter (HOSPITAL_COMMUNITY): Payer: BLUE CROSS/BLUE SHIELD

## 2016-03-18 ENCOUNTER — Encounter (HOSPITAL_COMMUNITY): Payer: BLUE CROSS/BLUE SHIELD | Admitting: Physical Therapy

## 2016-03-20 ENCOUNTER — Encounter (HOSPITAL_COMMUNITY): Payer: BLUE CROSS/BLUE SHIELD | Admitting: Physical Therapy

## 2016-05-15 DIAGNOSIS — R7301 Impaired fasting glucose: Secondary | ICD-10-CM | POA: Diagnosis not present

## 2016-05-15 DIAGNOSIS — E559 Vitamin D deficiency, unspecified: Secondary | ICD-10-CM | POA: Diagnosis not present

## 2016-05-15 DIAGNOSIS — Z0000 Encounter for general adult medical examination without abnormal findings: Secondary | ICD-10-CM | POA: Diagnosis not present

## 2016-05-20 DIAGNOSIS — M79661 Pain in right lower leg: Secondary | ICD-10-CM | POA: Diagnosis not present

## 2016-05-20 DIAGNOSIS — M79662 Pain in left lower leg: Secondary | ICD-10-CM | POA: Diagnosis not present

## 2016-05-21 DIAGNOSIS — M810 Age-related osteoporosis without current pathological fracture: Secondary | ICD-10-CM | POA: Diagnosis not present

## 2016-05-21 DIAGNOSIS — Z0000 Encounter for general adult medical examination without abnormal findings: Secondary | ICD-10-CM | POA: Diagnosis not present

## 2016-05-21 DIAGNOSIS — Z6826 Body mass index (BMI) 26.0-26.9, adult: Secondary | ICD-10-CM | POA: Diagnosis not present

## 2016-05-21 DIAGNOSIS — E249 Cushing's syndrome, unspecified: Secondary | ICD-10-CM | POA: Diagnosis not present

## 2016-05-21 DIAGNOSIS — D259 Leiomyoma of uterus, unspecified: Secondary | ICD-10-CM | POA: Diagnosis not present

## 2016-05-21 DIAGNOSIS — I7090 Unspecified atherosclerosis: Secondary | ICD-10-CM | POA: Diagnosis not present

## 2016-06-10 DIAGNOSIS — K5150 Left sided colitis without complications: Secondary | ICD-10-CM | POA: Diagnosis not present

## 2016-06-11 DIAGNOSIS — M79661 Pain in right lower leg: Secondary | ICD-10-CM | POA: Diagnosis not present

## 2016-06-11 DIAGNOSIS — M545 Low back pain: Secondary | ICD-10-CM | POA: Diagnosis not present

## 2016-06-11 DIAGNOSIS — I83212 Varicose veins of right lower extremity with both ulcer of calf and inflammation: Secondary | ICD-10-CM | POA: Diagnosis not present

## 2016-06-11 DIAGNOSIS — M25561 Pain in right knee: Secondary | ICD-10-CM | POA: Diagnosis not present

## 2016-06-11 DIAGNOSIS — M79662 Pain in left lower leg: Secondary | ICD-10-CM | POA: Diagnosis not present

## 2016-07-01 ENCOUNTER — Ambulatory Visit (INDEPENDENT_AMBULATORY_CARE_PROVIDER_SITE_OTHER): Payer: BLUE CROSS/BLUE SHIELD | Admitting: Sports Medicine

## 2016-07-01 ENCOUNTER — Ambulatory Visit (INDEPENDENT_AMBULATORY_CARE_PROVIDER_SITE_OTHER): Payer: Self-pay | Admitting: Sports Medicine

## 2016-07-01 DIAGNOSIS — M25562 Pain in left knee: Secondary | ICD-10-CM | POA: Diagnosis not present

## 2016-07-18 ENCOUNTER — Ambulatory Visit (HOSPITAL_COMMUNITY): Payer: BLUE CROSS/BLUE SHIELD

## 2016-07-29 ENCOUNTER — Ambulatory Visit (INDEPENDENT_AMBULATORY_CARE_PROVIDER_SITE_OTHER): Payer: BLUE CROSS/BLUE SHIELD | Admitting: Sports Medicine

## 2016-07-29 ENCOUNTER — Inpatient Hospital Stay (INDEPENDENT_AMBULATORY_CARE_PROVIDER_SITE_OTHER): Payer: Self-pay

## 2016-07-29 ENCOUNTER — Encounter (INDEPENDENT_AMBULATORY_CARE_PROVIDER_SITE_OTHER): Payer: Self-pay | Admitting: Sports Medicine

## 2016-07-29 VITALS — BP 121/81 | HR 73 | Ht 62.0 in | Wt 139.0 lb

## 2016-07-29 DIAGNOSIS — G5761 Lesion of plantar nerve, right lower limb: Secondary | ICD-10-CM

## 2016-07-29 DIAGNOSIS — M25561 Pain in right knee: Secondary | ICD-10-CM

## 2016-07-29 DIAGNOSIS — G8929 Other chronic pain: Secondary | ICD-10-CM

## 2016-07-29 MED ORDER — METHYLPREDNISOLONE ACETATE 40 MG/ML IJ SUSP
20.0000 mg | INTRAMUSCULAR | Status: AC | PRN
  Administered 2016-07-29: 20 mg via INTRA_ARTICULAR

## 2016-07-29 MED ORDER — LIDOCAINE HCL 1 % IJ SOLN
0.5000 mL | INTRAMUSCULAR | Status: AC | PRN
  Administered 2016-07-29: .5 mL

## 2016-07-29 MED ORDER — BUPIVACAINE HCL 0.5 % IJ SOLN
0.5000 mL | INTRAMUSCULAR | Status: AC | PRN
  Administered 2016-07-29: .5 mL via INTRA_ARTICULAR

## 2016-07-29 NOTE — Progress Notes (Signed)
Betty Bryan - 60 y.o. female MRN [id number]  Date of birth: [birth year]  Office Visit Note: Visit Date: 07/29/2016 PCP: Jerlyn Ly, MD Referred by: Crist Infante, MD  Subjective: Chief Complaint  Patient presents with  . Left Knee - Pain  . Follow-up   HPI: Patient states a lot of pain on the side of her right foot.  Hurts to apply pressure and walk. Flared up and has gotten worse.  No swelling.   Marked tenderness in the fourth web space. She has had this happen in the past. No relief with rest & elevation. Anti-inflammatories are only minimally helpful. Requesting injection today.  She has not increased her activity tremendously she has had no other specific injury to this area.      ROS Otherwise per HPI.  Assessment & Plan: Visit Diagnoses:  1. Morton's neuroma of right foot   2. Chronic pain of right knee     Plan: Findings:  Symptoms are consistent with Morton's neuroma. Her knee is improved since being on a nitroglycerin patch as well as status post injection. Will see how she does with injection today but can consider metatarsal support in the future if persistently symptomatic. Continue nitroglycerin for the anterior shin & follow-up as needed.  I am happy to follow up with this patient at my new location ([place] at [facility]) for their chronic ongoing issues.      Meds & Orders: No orders of the defined types were placed in this encounter.   Orders Placed This Encounter  Procedures  . Small Joint Injection/Arthrocentesis  . US Guided Needle Placement    Follow-up: Return if symptoms worsen or fail to improve.   Procedures: 4th Webspace Injection Date/Time: 07/29/2016 10:09 AM Performed by: Teresa Coombs D Authorized by: Teresa Coombs D   Location:  Fourth toe Needle Size:  25 G Spinal Needle: No   Ultrasound Guided: Yes   Fluoroscopic Guidance: No   Medications:  0.5 mL bupivacaine 0.5 %; 0.5 mL lidocaine 1  %; 20 mg methylPREDNISolone acetate 40 MG/ML Aspiration Attempted: No   Comments: The patient's clinical condition is marked by substantial pain and/or significant functional disability. Other conservative therapy has not provided relief, is contraindicated, or not appropriate. There is a reasonable likelihood that injection will significantly improve the patient's pain and/or functional impairment.  After discussing the risks, benefits and expected outcomes of the injection and all questions were reviewed and answered, the patient wished to undergo the above named procedure.  Verbal consent was obtained. The target sight was prepped with alcohol scrub. Local anesthesia was obtained with ethyl chloride   Under real-time ultrasound guidance the target structure was injected using the above needle and medications. Band-Aid was applied. The patient tolerated this procedure well with no immediate complications. Post injection instructions were provided.       No notes on file   Clinical History: No specialty comments available.  She reports that she has never smoked. She does not have any smokeless tobacco history on file. No results for input(s): HGBA1C, LABURIC in the last 8760 hours.  Objective:  VS:  HT:5\' 2"  (157.5 cm)   WT:139 lb (63 kg)  BMI:25.5    BP:121/81  HR:73bpm  TEMP: ( )  RESP:  Physical Exam  Constitutional: She appears well-developed and well-nourished. No distress.  HENT:  Head: Normocephalic and atraumatic.  Pulmonary/Chest: Effort normal. No respiratory distress.  Musculoskeletal:  Right foot is overall well  line. She has good forefoot motion. No significant callus formation no significant bunion formation. She has marked pain with metatarsal squeeze test & a positive Mulder's click when the fourth web space. Pain is reproducible with palpation of the fourth web space. No focal bony tenderness. DP & PT pulses 2+/4.  Neurological: She is alert.  Skin: Skin is warm and dry.  No rash noted. She is not diaphoretic. No erythema. No pallor.  Psychiatric: She has a normal mood and affect. Her behavior is normal. Judgment and thought content normal.    Ortho Exam Imaging: US Guided Needle Placement  Result Date: 07/29/2016 Images were obtained & stored of ultrasound guided fourth webspace injection   Past Medical/Family/Surgical/Social History: Medications & Allergies reviewed per EMR Patient Active Problem List   Diagnosis Date Noted  . Varicose veins of leg with complications 0000000  . Chronic venous insufficiency 06/23/2015   Past Medical History:  Diagnosis Date  . Adrenal insufficiency ([place])   . HTN (hypertension)   . Palpitations   . PVC's (premature ventricular contractions)   . Ulcerative colitis    Family History  Problem Relation Age of Onset  . Heart failure    . Diabetes Father   . Heart disease Father   . Hypertension Father    Past Surgical History:  Procedure Laterality Date  . HAND SURGERY    . NASAL SINUS SURGERY     Social History   Occupational History  . sales rep    Social History Main Topics  . Smoking status: Never Smoker  . Smokeless tobacco: Not on file  . Alcohol use No  . Drug use: No  . Sexual activity: Not on file

## 2016-07-29 NOTE — Patient Instructions (Signed)
I am transferring practices as of January 1st  to [facility] at [place].  This is a great opportunity & I am saddened to be leaving [facility] however & excited for new opportunities. I will continue to be seeing patients at [facility] through the end of December. I am happy to see you at the new location but also am confident that you are in great hands with the excellent providers here at [facility].  We are not currently scheduling patients at the new location at this time but if you look on [facility]'s website a contact information should be available there closer to January. Additionally [email or web address] will have information when it becomes available.   

## 2016-08-12 ENCOUNTER — Ambulatory Visit (INDEPENDENT_AMBULATORY_CARE_PROVIDER_SITE_OTHER): Payer: BLUE CROSS/BLUE SHIELD | Admitting: Sports Medicine

## 2016-09-27 ENCOUNTER — Ambulatory Visit (INDEPENDENT_AMBULATORY_CARE_PROVIDER_SITE_OTHER): Payer: BLUE CROSS/BLUE SHIELD

## 2016-09-27 ENCOUNTER — Ambulatory Visit (INDEPENDENT_AMBULATORY_CARE_PROVIDER_SITE_OTHER): Payer: BLUE CROSS/BLUE SHIELD | Admitting: Podiatry

## 2016-09-27 ENCOUNTER — Encounter: Payer: Self-pay | Admitting: Podiatry

## 2016-09-27 VITALS — BP 113/76 | HR 76 | Resp 16 | Ht 62.0 in | Wt 140.0 lb

## 2016-09-27 DIAGNOSIS — M779 Enthesopathy, unspecified: Secondary | ICD-10-CM | POA: Diagnosis not present

## 2016-09-27 DIAGNOSIS — M79671 Pain in right foot: Secondary | ICD-10-CM | POA: Diagnosis not present

## 2016-09-27 DIAGNOSIS — D3610 Benign neoplasm of peripheral nerves and autonomic nervous system, unspecified: Secondary | ICD-10-CM

## 2016-09-27 MED ORDER — TRIAMCINOLONE ACETONIDE 10 MG/ML IJ SUSP
10.0000 mg | Freq: Once | INTRAMUSCULAR | Status: AC
  Administered 2016-09-27: 10 mg

## 2016-09-27 NOTE — Progress Notes (Signed)
Subjective:     Patient ID: Betty Bryan, female   DOB: 1955/10/22, 61 y.o.   MRN: [id number]  HPI patient states she's been diagnosed with a tentative neuroma of the right foot and had injections which was not successful and the pain is been present for several years and is worse now and worse when she gets up or when she's on her foot   Review of Systems  All other systems reviewed and are negative.      Objective:   Physical Exam  Constitutional: She is oriented to person, place, and time.  Cardiovascular: Intact distal pulses.   Musculoskeletal: Normal range of motion.  Neurological: She is oriented to person, place, and time.  Skin: Skin is warm.  Nursing note and vitals reviewed.  Neurovascular status intact muscle strength was adequate range of motion within normal limits with inflammation and fluid around the third MPJ right with pain when palpated with difficulty walking and a comfortable gait pattern. Patient had a negative Biagio Borg sign but there is discomfort in the interspace but not to the degree of the joint itself     Assessment:     Probability for capsulitis third MPJ right with possibility for neuroma symptoms still present    Plan:     H&P x-ray reviewed condition discussed and explained the differences and the problems. At this point I did a proximal nerve block aspirated the third MPJ getting out of small amount of clear fluid and injected with quarter cc deck some some Kenalog and applied thick plantar pad to reduce pressure on the joint surface. Patient will be seen back for [place] to recheck  X-ray indicated no indication stress fracture or advanced arthritis with narrowing of the third intermetatarsal space

## 2016-10-11 ENCOUNTER — Ambulatory Visit: Payer: BLUE CROSS/BLUE SHIELD | Admitting: Podiatry

## 2016-10-16 ENCOUNTER — Ambulatory Visit (INDEPENDENT_AMBULATORY_CARE_PROVIDER_SITE_OTHER): Payer: BLUE CROSS/BLUE SHIELD | Admitting: Podiatry

## 2016-10-16 ENCOUNTER — Encounter: Payer: Self-pay | Admitting: Podiatry

## 2016-10-16 DIAGNOSIS — M779 Enthesopathy, unspecified: Secondary | ICD-10-CM | POA: Diagnosis not present

## 2016-10-16 DIAGNOSIS — D3610 Benign neoplasm of peripheral nerves and autonomic nervous system, unspecified: Secondary | ICD-10-CM

## 2016-10-16 MED ORDER — TRIAMCINOLONE ACETONIDE 10 MG/ML IJ SUSP
10.0000 mg | Freq: Once | INTRAMUSCULAR | Status: AC
  Administered 2016-10-16: 10 mg

## 2016-10-16 NOTE — Progress Notes (Signed)
Subjective:     Patient ID: Betty Bryan, female   DOB: November 24, 1955, 61 y.o.   MRN: [id number]  HPI patient states the forefoot is feeling quite a bit better but now the outside of the foot is hurting quite a bit   Review of Systems     Objective:   Physical Exam Neurovascular status intact with diminished discomfort third MPJ right with third interspace right still showing slight discomfort and pain in the lateral side of the right foot around the peroneal complex and the insertion into the base of the fifth metatarsal    Assessment:     Inflammatory tendinitis lateral foot right peroneal insertion which may be compensatory for forefoot problems patient has    Plan:     H&P conditions reviewed and careful sheath injection administered right 3 mg Kenalog 5 mg Xylocaine and instructed on physical therapy and dispensed a fascial brace to lift up the lateral foot. Reappoint to recheck

## 2016-11-06 ENCOUNTER — Ambulatory Visit: Payer: BLUE CROSS/BLUE SHIELD | Admitting: Podiatry

## 2016-11-11 ENCOUNTER — Encounter: Payer: Self-pay | Admitting: Podiatry

## 2016-11-11 ENCOUNTER — Ambulatory Visit (INDEPENDENT_AMBULATORY_CARE_PROVIDER_SITE_OTHER): Payer: BLUE CROSS/BLUE SHIELD | Admitting: Podiatry

## 2016-11-11 DIAGNOSIS — M779 Enthesopathy, unspecified: Secondary | ICD-10-CM | POA: Diagnosis not present

## 2016-11-11 MED ORDER — TRIAMCINOLONE ACETONIDE 10 MG/ML IJ SUSP
10.0000 mg | Freq: Once | INTRAMUSCULAR | Status: AC
  Administered 2016-11-11: 10 mg

## 2016-11-11 NOTE — Progress Notes (Addendum)
Subjective:     Patient ID: Betty Bryan, female   DOB: Feb 06, 1956, 61 y.o.   MRN: [id number]  HPI patient presents stating that the outside of the right foot is feeling better the metatarsals feeling better but now it's more around the outside of her foot towards the fifth toe   Review of Systems     Objective:   Physical Exam Neurovascular status intact muscle strength adequate with inflammation and pain around the fifth metatarsal head right with fluid buildup around the joint and pain when palpated    Assessment:     Inflammatory capsulitis fifth MPJ right with fluid buildup    Plan:     H&P condition reviewed and careful injection administered around the fifth MPJ right. Discussed orthotics and she has an old pair that she's not wearing and they were made at [employer] and she is going to bring them in for me to reevaluate when she comes in 4 weeks

## 2016-12-11 ENCOUNTER — Ambulatory Visit: Payer: BLUE CROSS/BLUE SHIELD | Admitting: Podiatry

## 2016-12-17 ENCOUNTER — Ambulatory Visit: Payer: BLUE CROSS/BLUE SHIELD | Admitting: Podiatry

## 2016-12-30 DIAGNOSIS — K5150 Left sided colitis without complications: Secondary | ICD-10-CM | POA: Diagnosis not present

## 2017-01-03 ENCOUNTER — Encounter: Payer: Self-pay | Admitting: Podiatry

## 2017-01-03 ENCOUNTER — Ambulatory Visit (INDEPENDENT_AMBULATORY_CARE_PROVIDER_SITE_OTHER): Payer: BLUE CROSS/BLUE SHIELD

## 2017-01-03 ENCOUNTER — Telehealth: Payer: Self-pay | Admitting: *Deleted

## 2017-01-03 ENCOUNTER — Ambulatory Visit (INDEPENDENT_AMBULATORY_CARE_PROVIDER_SITE_OTHER): Payer: BLUE CROSS/BLUE SHIELD | Admitting: Podiatry

## 2017-01-03 DIAGNOSIS — M84375A Stress fracture, left foot, initial encounter for fracture: Secondary | ICD-10-CM

## 2017-01-03 DIAGNOSIS — M779 Enthesopathy, unspecified: Secondary | ICD-10-CM | POA: Diagnosis not present

## 2017-01-03 NOTE — Progress Notes (Signed)
CD of X-rays from date of service Friday 03 January 2017 have been put up front to be mailed to patient at home. Will either go out in the mail today or Monday 06 January 2017.

## 2017-01-03 NOTE — Telephone Encounter (Addendum)
Pt states she was seen By Dr. 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 I told pt a "full blown" fracture as she had described would hurt more, and that I understood her not wanting to take a lot of oral medications with ulcerative colitis, but if she needed pain control she may want to take more. Pt states Dr. Amalia Hailey had said she could also boost the pain relief with tylenol. I told her she could try taking 1/4 tablet of tramadol with regular strength tylenol, and if need more could next dose got to 1/2 tramadol with regular strength tylenol. Pt states understanding.

## 2017-01-03 NOTE — Telephone Encounter (Signed)
Already spoke to patient and took care of her request.

## 2017-01-05 NOTE — Progress Notes (Signed)
Subjective:     Patient ID: Betty Bryan, female   DOB: Mar 21, 1956, 61 y.o.   MRN: [id number]  HPI patient presents stating I don't know what happened in my left foot but it's been extremely tender and I cannot bear weight on it. It started hurting me yesterday   Review of Systems     Objective:   Physical Exam Neurovascular status intact with muscle strength adequate and moderate swelling of the dorsum of the left foot with inflammation pain in the distal second metatarsal shaft which is extremely sore when pressed    Assessment:     Probability for stress fracture left second metatarsal    Plan:     Reviewed condition and at this time reviewed x-rays with patient. Advised on ice and due to the acuteness of condition I placed in an air fracture walker to completely immobilize the plantar foot  X-ray indicates suspicious sign for stress fracture second metatarsal distal shaft but will not know answered to we see her back in 3 weeks

## 2017-01-13 ENCOUNTER — Encounter: Payer: Self-pay | Admitting: Podiatry

## 2017-01-13 ENCOUNTER — Ambulatory Visit (INDEPENDENT_AMBULATORY_CARE_PROVIDER_SITE_OTHER): Payer: BLUE CROSS/BLUE SHIELD | Admitting: Podiatry

## 2017-01-13 ENCOUNTER — Ambulatory Visit (INDEPENDENT_AMBULATORY_CARE_PROVIDER_SITE_OTHER): Payer: BLUE CROSS/BLUE SHIELD

## 2017-01-13 DIAGNOSIS — S92345D Nondisplaced fracture of fourth metatarsal bone, left foot, subsequent encounter for fracture with routine healing: Secondary | ICD-10-CM

## 2017-01-13 DIAGNOSIS — M84375A Stress fracture, left foot, initial encounter for fracture: Secondary | ICD-10-CM

## 2017-01-13 MED ORDER — TRAMADOL HCL 50 MG PO TABS: 50.0000 mg | ORAL_TABLET | Freq: Three times a day (TID) | ORAL | 0 refills | Status: DC | PRN

## 2017-01-14 NOTE — Progress Notes (Signed)
   HPI: Patient is a 61 year old female presenting today for follow-up evaluation for pain in the lateral left foot. Pt saw Dr. Paulla Dolly 10 days ago and is suspected to have a stress fracture. She states her pain has been worsening. She states the boot she was given at the last visit made the pain worse.   Physical Exam: General: The patient is alert and oriented x3 in no acute distress.  Dermatology: Skin is warm, dry and supple bilateral lower extremities. Negative for open lesions or macerations.  Vascular: Palpable pedal pulses bilaterally. No edema or erythema noted. Capillary refill within normal limits.  Neurological: Epicritic and protective threshold grossly intact bilaterally.   Musculoskeletal Exam: Pain on palpation noted overlying the fourth metatarsal the left foot. Range of motion within normal limits to all pedal and ankle joints bilateral. Muscle strength 5/5 in all groups bilateral.   Radiographic Exam:  Transverse, nondisplaced fracture to the diaphysis of the fourth metatarsal of the left foot. Joint spaces preserved.  Assessment: 1. Fracture fourth metatarsal left foot   Plan of Care:  1. Patient was evaluated. 2. Prescription for tramadol given 3. Postop shoe dispensed 4. Compression anklet dispensed 5. Return to clinic in 4 weeks   Edrick Kins, DPM [facility]  Dr. Edrick Kins, [address]                                        [place], [place] [address]                Office [phone]  Fax [phone]

## 2017-01-24 ENCOUNTER — Ambulatory Visit: Payer: BLUE CROSS/BLUE SHIELD | Admitting: Podiatry

## 2017-02-03 ENCOUNTER — Ambulatory Visit (INDEPENDENT_AMBULATORY_CARE_PROVIDER_SITE_OTHER): Payer: BLUE CROSS/BLUE SHIELD | Admitting: Podiatry

## 2017-02-03 ENCOUNTER — Ambulatory Visit (INDEPENDENT_AMBULATORY_CARE_PROVIDER_SITE_OTHER): Payer: BLUE CROSS/BLUE SHIELD

## 2017-02-03 DIAGNOSIS — M84375A Stress fracture, left foot, initial encounter for fracture: Secondary | ICD-10-CM

## 2017-02-03 DIAGNOSIS — M84375G Stress fracture, left foot, subsequent encounter for fracture with delayed healing: Secondary | ICD-10-CM | POA: Diagnosis not present

## 2017-02-03 NOTE — Progress Notes (Signed)
   HPI: Patient is a 61 year old female presenting today for follow-up evaluation of a fracture to the fourth metatarsal of the left foot. She states her pain is worse. She reports associated redness and swelling of the area. She is here for further evaluation and treatment.   Physical Exam: General: The patient is alert and oriented x3 in no acute distress.  Dermatology: Skin is warm, dry and supple bilateral lower extremities. Negative for open lesions or macerations.  Vascular: Palpable pedal pulses bilaterally. No edema or erythema noted. Capillary refill within normal limits.  Neurological: Epicritic and protective threshold grossly intact bilaterally.   Musculoskeletal Exam: Pain on palpation noted overlying the fourth metatarsal the left foot. Range of motion within normal limits to all pedal and ankle joints bilateral. Muscle strength 5/5 in all groups bilateral.   Radiographic Exam:  Transverse, mildly displaced fracture to the diaphysis of the fourth metatarsal of the left foot. Joint spaces preserved.  Assessment: 1. Fracture fourth metatarsal left foot with mild displacement   Plan of Care:  1. Patient was evaluated. X-Rays reviewed. 2. Continue wearing CAM boot and postop shoe. 3. Continue compression anklet.  4. Return to clinic in 4 weeks   Edrick Kins, DPM [facility]  Dr. Edrick Kins, [address]                                        [place], [place] [address]                Office [phone]  Fax [phone]

## 2017-02-10 ENCOUNTER — Ambulatory Visit: Payer: BLUE CROSS/BLUE SHIELD | Admitting: Podiatry

## 2017-03-03 ENCOUNTER — Encounter: Payer: Self-pay | Admitting: Podiatry

## 2017-03-03 ENCOUNTER — Ambulatory Visit (INDEPENDENT_AMBULATORY_CARE_PROVIDER_SITE_OTHER): Payer: BLUE CROSS/BLUE SHIELD

## 2017-03-03 ENCOUNTER — Ambulatory Visit (INDEPENDENT_AMBULATORY_CARE_PROVIDER_SITE_OTHER): Payer: BLUE CROSS/BLUE SHIELD | Admitting: Podiatry

## 2017-03-03 DIAGNOSIS — M7751 Other enthesopathy of right foot: Secondary | ICD-10-CM

## 2017-03-03 DIAGNOSIS — M84375G Stress fracture, left foot, subsequent encounter for fracture with delayed healing: Secondary | ICD-10-CM | POA: Diagnosis not present

## 2017-03-03 MED ORDER — TRAMADOL HCL 50 MG PO TABS: 50.0000 mg | ORAL_TABLET | Freq: Three times a day (TID) | ORAL | 0 refills | Status: DC | PRN

## 2017-03-12 NOTE — Progress Notes (Signed)
   HPI: Patient is a 61 year old female presenting today for follow-up evaluation of a fracture to the fourth metatarsal of the left foot. She reports her pain has improved and states it is only intermittent now. She also complains of pain in the right foot. She states she was seeing Dr. Paulla Dolly for capsulitis of the right foot and received 3 injections which helped alleviate that pain.   Physical Exam: General: The patient is alert and oriented x3 in no acute distress.  Dermatology: Skin is warm, dry and supple bilateral lower extremities. Negative for open lesions or macerations.  Vascular: Palpable pedal pulses bilaterally. No edema or erythema noted. Capillary refill within normal limits.  Neurological: Epicritic and protective threshold grossly intact bilaterally.   Musculoskeletal Exam: Pain on palpation noted overlying the fourth metatarsal the left foot. Range of motion within normal limits to all pedal and ankle joints bilateral. Muscle strength 5/5 in all groups bilateral.   Radiographic Exam:  Transverse, mildly displaced fracture to the diaphysis of the fourth metatarsal of the left foot with routine healing. Joint spaces preserved.  Assessment: 1. Fracture fourth metatarsal left foot with mild displacement 2. Fourth MPJ capsulitis right   Plan of Care:  1. Patient was evaluated. X-Rays reviewed. 2. Continue wearing postop shoe on the left foot. 3. Injection of 0.5 mLs Celestone Soluspan injected into the fourth MPJ of the right foot. 4. Prescription for tramadol 50 mg #60 given to patient. 5. Return to clinic in 4 weeks.    Edrick Kins, DPM [facility]  Dr. Edrick Kins, [address]                                        [place], [place] [address]                Office [phone]  Fax [phone]

## 2017-03-17 MED ORDER — BETAMETHASONE SOD PHOS & ACET 6 (3-3) MG/ML IJ SUSP: 3.0000 mg | Freq: Once | INTRAMUSCULAR | Status: DC

## 2017-03-19 ENCOUNTER — Telehealth: Payer: Self-pay | Admitting: Podiatry

## 2017-03-19 NOTE — Telephone Encounter (Signed)
Hello Betty Bryan, my name is Judson Roch and I'm calling on behalf of the prior authorization patients Rx of Tramadol. I faxed a correct form to [phone] that the doctor needs to fill out and sign, and could be faxed back to the number at the bottom of the form. I was wondering if the patient has had any opioids in the past 180 days. If you would, please call me back at [phone] ext. [phone]. Thank you.

## 2017-03-19 NOTE — Telephone Encounter (Signed)
I spoke with Vivien Presto and she asked if pt had filled a prescription for a opioid in the last 180 days and I told her no. Judson Roch states that is all she needed to send the case for review.

## 2017-03-31 ENCOUNTER — Ambulatory Visit (INDEPENDENT_AMBULATORY_CARE_PROVIDER_SITE_OTHER): Payer: BLUE CROSS/BLUE SHIELD

## 2017-03-31 ENCOUNTER — Telehealth: Payer: Self-pay | Admitting: Podiatry

## 2017-03-31 ENCOUNTER — Ambulatory Visit (INDEPENDENT_AMBULATORY_CARE_PROVIDER_SITE_OTHER): Payer: BLUE CROSS/BLUE SHIELD | Admitting: Podiatry

## 2017-03-31 DIAGNOSIS — M84375G Stress fracture, left foot, subsequent encounter for fracture with delayed healing: Secondary | ICD-10-CM

## 2017-03-31 DIAGNOSIS — M79671 Pain in right foot: Secondary | ICD-10-CM

## 2017-03-31 NOTE — Telephone Encounter (Signed)
Pt was seen this morning by Dr. Amalia Hailey and found a stress fracture in her right foot. She has been tapering off of the tramadol and is down to 1/4 tablet 4 times a day. Pt wants to know if she should keep doing this now that she has the new fracture.

## 2017-03-31 NOTE — Telephone Encounter (Signed)
Dr. Amalia Hailey states it would be fine to continue the 1/4 tramadol for the new stress fracture. Left message informing pt of Dr. Amalia Hailey statement.

## 2017-04-07 NOTE — Progress Notes (Signed)
   HPI: Patient is a 61 year old female presenting today for follow-up evaluation of a fracture to the fourth metatarsal of the left foot. She states her pain has improved. She denies any new complaints at this time.   Physical Exam: General: The patient is alert and oriented x3 in no acute distress.  Dermatology: Skin is warm, dry and supple bilateral lower extremities. Negative for open lesions or macerations.  Vascular: Palpable pedal pulses bilaterally. No edema or erythema noted. Capillary refill within normal limits.  Neurological: Epicritic and protective threshold grossly intact bilaterally.   Musculoskeletal Exam: Pain on palpation noted overlying the fourth metatarsal bilateral feet. Range of motion within normal limits to all pedal and ankle joints bilateral. Muscle strength 5/5 in all groups bilateral.   Radiographic Exam:  Transverse stress fractures of fourth metatarsals with delayed healing bilaterally.  Assessment: 1. Stress fracture fourth metatarsals bilaterally   Plan of Care:  1. Patient was evaluated. X-Rays reviewed. 2. Postoperative shoe dispensed for right foot. 3. Continue wearing postop shoe for left foot. 4. Compression anklet dispensed 2. 5. Return to clinic in 6 weeks for follow-up x-ray.    Edrick Kins, DPM [facility]  Dr. Edrick Kins, [address]                                        [place], [place] [address]                Office [phone]  Fax [phone]

## 2017-04-09 DIAGNOSIS — R7301 Impaired fasting glucose: Secondary | ICD-10-CM | POA: Diagnosis not present

## 2017-04-09 DIAGNOSIS — K5190 Ulcerative colitis, unspecified, without complications: Secondary | ICD-10-CM | POA: Diagnosis not present

## 2017-04-09 DIAGNOSIS — M545 Low back pain: Secondary | ICD-10-CM | POA: Diagnosis not present

## 2017-04-09 DIAGNOSIS — M810 Age-related osteoporosis without current pathological fracture: Secondary | ICD-10-CM | POA: Diagnosis not present

## 2017-04-22 ENCOUNTER — Ambulatory Visit (INDEPENDENT_AMBULATORY_CARE_PROVIDER_SITE_OTHER): Payer: BLUE CROSS/BLUE SHIELD | Admitting: Orthopaedic Surgery

## 2017-04-22 ENCOUNTER — Encounter (INDEPENDENT_AMBULATORY_CARE_PROVIDER_SITE_OTHER): Payer: Self-pay | Admitting: Orthopaedic Surgery

## 2017-04-22 ENCOUNTER — Ambulatory Visit (INDEPENDENT_AMBULATORY_CARE_PROVIDER_SITE_OTHER): Payer: BLUE CROSS/BLUE SHIELD

## 2017-04-22 DIAGNOSIS — M545 Low back pain: Secondary | ICD-10-CM | POA: Diagnosis not present

## 2017-04-22 DIAGNOSIS — G8929 Other chronic pain: Secondary | ICD-10-CM | POA: Diagnosis not present

## 2017-04-22 NOTE — Progress Notes (Signed)
Office Visit Note   Patient: Betty Bryan           Date of Birth: 1955/12/19           MRN: [id number] Visit Date: 04/22/2017              Requested by: Crist Infante, MD [address] [place], [place] [address] PCP: Crist Infante, MD   Assessment & Plan: Visit Diagnoses:  1. Chronic bilateral low back pain, with sciatica presence unspecified     Plan: Overall impression is lumbar radiculopathy on the right side. Patient would like to try an injection with Dr. Ernestina Patches first before getting another MRI. We will get her set up for this. If she has no relief from this and she'll need a repeat MRI. I did review the x-rays which do not appear to show any fractures.  Follow-Up Instructions: Return if symptoms worsen or fail to improve.   Orders:  Orders Placed This Encounter  Procedures  . XR Lumbar Spine 2-3 Views  . Ambulatory referral to Physical Medicine Rehab   No orders of the defined types were placed in this encounter.     Procedures: No procedures performed   Clinical Data: No additional findings.   Subjective: Chief Complaint  Patient presents with  . Lower Back - Pain    Patient is a very pleasant 61 year old female comes in with low back pain with radiation down her right leg. She's had a previous MRI back in 2018. She takes tramadol for pain. She is been on prednisone for ulcerative colitis for 11 years. She is currently has stress fractures in her feet. She denies any injuries. Denies any bowel or bladder.    Review of Systems  Constitutional: Negative.   HENT: Negative.   Eyes: Negative.   Respiratory: Negative.   Cardiovascular: Negative.   Endocrine: Negative.   Musculoskeletal: Negative.   Neurological: Negative.   Hematological: Negative.   Psychiatric/Behavioral: Negative.   All other systems reviewed and are negative.    Objective: Vital Signs: There were no vitals taken for this visit.  Physical Exam  Constitutional: She is oriented  to person, place, and time. She appears well-developed and well-nourished.  HENT:  Head: Normocephalic and atraumatic.  Eyes: EOM are normal.  Neck: Neck supple.  Pulmonary/Chest: Effort normal.  Abdominal: Soft.  Neurological: She is alert and oriented to person, place, and time.  Skin: Skin is warm. Capillary refill takes less than 2 seconds.  Psychiatric: She has a normal mood and affect. Her behavior is normal. Judgment and thought content normal.  Nursing note and vitals reviewed.   Ortho Exam Right lower extremity lumbar exam are relatively benign. She does not have any significant tenderness of her spine. She is neurovascularly intact. Specialty Comments:  No specialty comments available.  Imaging: Xr Lumbar Spine 2-3 Views  Result Date: 04/22/2017 No evidence of acute structural findings    PMFS History: Patient Active Problem List   Diagnosis Date Noted  . Varicose veins of leg with complications 29/79/8921  . Chronic venous insufficiency 06/23/2015   Past Medical History:  Diagnosis Date  . Adrenal insufficiency ([place])   . HTN (hypertension)   . Palpitations   . PVC's (premature ventricular contractions)   . Ulcerative colitis     Family History  Problem Relation Age of Onset  . Heart failure Unknown   . Diabetes Father   . Heart disease Father   . Hypertension Father     Past  Surgical History:  Procedure Laterality Date  . HAND SURGERY    . NASAL SINUS SURGERY     Social History   Occupational History  . sales rep    Social History Main Topics  . Smoking status: Never Smoker  . Smokeless tobacco: Never Used  . Alcohol use No  . Drug use: No  . Sexual activity: Not on file

## 2017-04-24 ENCOUNTER — Telehealth (INDEPENDENT_AMBULATORY_CARE_PROVIDER_SITE_OTHER): Payer: Self-pay | Admitting: Radiology

## 2017-04-24 NOTE — Telephone Encounter (Signed)
You can give her cpt code for lumbar epidural injection and she can call insurance company to get details for co-pay and deductable.

## 2017-04-24 NOTE — Telephone Encounter (Signed)
Patient is calling, states that she has discussed with Dr. Erlinda Hong getting a Spinal Epidural injection, how ever she is needing more info on them and how much they are due to her having a higher deductable. Please advise.

## 2017-04-25 NOTE — Telephone Encounter (Signed)
I called and lmom that she would need to call her insurance and see how much they would cover for the injection --CPT Code [id number]

## 2017-04-28 ENCOUNTER — Ambulatory Visit (INDEPENDENT_AMBULATORY_CARE_PROVIDER_SITE_OTHER): Payer: BLUE CROSS/BLUE SHIELD

## 2017-04-28 ENCOUNTER — Ambulatory Visit (INDEPENDENT_AMBULATORY_CARE_PROVIDER_SITE_OTHER): Payer: BLUE CROSS/BLUE SHIELD | Admitting: Podiatry

## 2017-04-28 DIAGNOSIS — S92344D Nondisplaced fracture of fourth metatarsal bone, right foot, subsequent encounter for fracture with routine healing: Secondary | ICD-10-CM | POA: Diagnosis not present

## 2017-04-28 DIAGNOSIS — M84375G Stress fracture, left foot, subsequent encounter for fracture with delayed healing: Secondary | ICD-10-CM | POA: Diagnosis not present

## 2017-04-28 DIAGNOSIS — M7751 Other enthesopathy of right foot: Secondary | ICD-10-CM | POA: Diagnosis not present

## 2017-04-28 MED ORDER — TRAMADOL HCL 50 MG PO TABS: 50.0000 mg | ORAL_TABLET | Freq: Three times a day (TID) | ORAL | 0 refills | Status: DC | PRN

## 2017-04-28 NOTE — Progress Notes (Signed)
   HPI: Patient presents today for follow-up evaluation of bilateral fourth metatarsal fractures. Patient states that her left foot is doing very well. She denies pain. She is still having some residual pain in her right foot. She continues to take tramadol as needed for pain. Patient is wearing hospital postoperative shoes to the bilateral feet.   Physical Exam: General: The patient is alert and oriented x3 in no acute distress.  Dermatology: Skin is warm, dry and supple bilateral lower extremities. Negative for open lesions or macerations.  Vascular: Palpable pedal pulses bilaterally. No edema or erythema noted. Capillary refill within normal limits.  Neurological: Epicritic and protective threshold grossly intact bilaterally.   Musculoskeletal Exam: Pain on palpation noted overlying the fourth metatarsal bilateral feet. Range of motion within normal limits to all pedal and ankle joints bilateral. Muscle strength 5/5 in all groups bilateral.   Radiographic Exam:  Transverse stress fractures of fourth metatarsals with routine healing bilaterally. Today the fractures appear significantly improved from radiographic exam taken 4 weeks prior.  Assessment: 1. Stress fracture fourth metatarsals bilaterally   Plan of Care:  1. Patient was evaluated. X-Rays reviewed. 2. Today we are going to continue wearing the postoperative shoes 4 weeks 3. Prescription for tramadol 50 mg 4. The patient states that she has nitroglycerin patches at home that she was using for her right knee pain. She wants to know if she can apply the patches to her feet. I informed her that she should not have any detrimental effects from the nitroglycerin patches regarding bone healing. Okay to apply nitroglycerin patches to the dorsum of the bilateral feet overlying the fourth metatarsals 5. Return to clinic in 4 weeks  Patient goes by [name]. Patient sees Dr. Haynes Kerns at [facility]   Edrick Kins, DPM [facility]  Dr. Edrick Kins, [address]                                        [place], [place] [address]                Office [phone]  Fax [phone]

## 2017-05-09 DIAGNOSIS — M545 Low back pain: Secondary | ICD-10-CM | POA: Diagnosis not present

## 2017-05-09 DIAGNOSIS — R300 Dysuria: Secondary | ICD-10-CM | POA: Diagnosis not present

## 2017-05-09 DIAGNOSIS — Z6826 Body mass index (BMI) 26.0-26.9, adult: Secondary | ICD-10-CM | POA: Diagnosis not present

## 2017-05-09 DIAGNOSIS — R1030 Lower abdominal pain, unspecified: Secondary | ICD-10-CM | POA: Diagnosis not present

## 2017-05-12 ENCOUNTER — Ambulatory Visit: Payer: BLUE CROSS/BLUE SHIELD | Admitting: Podiatry

## 2017-05-20 ENCOUNTER — Encounter (INDEPENDENT_AMBULATORY_CARE_PROVIDER_SITE_OTHER): Payer: BLUE CROSS/BLUE SHIELD | Admitting: Physical Medicine and Rehabilitation

## 2017-05-21 ENCOUNTER — Encounter (INDEPENDENT_AMBULATORY_CARE_PROVIDER_SITE_OTHER): Payer: Self-pay | Admitting: Physical Medicine and Rehabilitation

## 2017-05-21 ENCOUNTER — Ambulatory Visit (INDEPENDENT_AMBULATORY_CARE_PROVIDER_SITE_OTHER): Payer: BLUE CROSS/BLUE SHIELD | Admitting: Physical Medicine and Rehabilitation

## 2017-05-21 ENCOUNTER — Ambulatory Visit (INDEPENDENT_AMBULATORY_CARE_PROVIDER_SITE_OTHER): Payer: BLUE CROSS/BLUE SHIELD

## 2017-05-21 VITALS — BP 132/80 | HR 72

## 2017-05-21 DIAGNOSIS — M5416 Radiculopathy, lumbar region: Secondary | ICD-10-CM | POA: Diagnosis not present

## 2017-05-21 MED ORDER — LIDOCAINE HCL (PF) 1 % IJ SOLN
2.0000 mL | Freq: Once | INTRAMUSCULAR | Status: AC
  Administered 2017-05-21: 2 mL

## 2017-05-21 MED ORDER — BETAMETHASONE SOD PHOS & ACET 6 (3-3) MG/ML IJ SUSP
12.0000 mg | Freq: Once | INTRAMUSCULAR | Status: AC
  Administered 2017-05-21: 12 mg

## 2017-05-21 NOTE — Patient Instructions (Signed)

## 2017-05-21 NOTE — Progress Notes (Deleted)
Constant low back pain for the past several weeks. Was in a boot for around 3 months due to foot fractures and she thinks this might have contributed to her pain. Worse first thing in the morning and getting in and out of vehicle. Radiating down sides of legs. Worse on right side.

## 2017-05-23 NOTE — Procedures (Signed)
Betty Bryan is a 61 year old female on chronic prednisone for ulcerative colitis. She's been followed for her low back and hip pain by her primary care physician Dr. Haynes Kerns as well as Dr. Erlinda Hong. 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 He reports constant low back pain for several weeks. She had a stress fracture was in a walking boot and wonders if that contributed to her problem. It is radiating down the sides of both legs but worse on the right. I think an epidural injection diagnostically and therapeutically is worthwhile. We discussed side effects of prolonged prednisone which she has had many. If the injection is not beneficial I will suggest repeat MRI of the lumbar spine.  Lumbar Epidural Steroid Injection - Interlaminar Approach with Fluoroscopic Guidance  Patient: Betty Bryan      Date of Birth: 1956/07/31 MRN: [id number] PCP: Crist Infante, MD      Visit Date: 05/21/2017   Universal Protocol:     Consent Given By: the patient  Position: PRONE  Additional Comments: Vital signs were monitored before and after the procedure. Patient was prepped and draped in the usual sterile fashion. The correct patient, procedure, and site was verified.   Injection Procedure Details:  Procedure Site One Meds Administered:  Meds ordered this encounter  Medications  . lidocaine (PF) (XYLOCAINE) 1 % injection 2 mL  . betamethasone acetate-betamethasone sodium phosphate (CELESTONE) injection 12 mg     Laterality: Left  Location/Site:  L5-S1  Needle size: 20 G  Needle type: Tuohy  Needle Placement: Paramedian epidural  Findings:  -Contrast Used: 0.5 mL iohexol 180 mg iodine/mL   -Comments: Excellent flow of contrast into the epidural space.  Procedure Details: Using a paramedian approach from the side mentioned above, the region overlying the inferior lamina was localized under fluoroscopic  visualization and the soft tissues overlying this structure were infiltrated with 4 ml. of 1% Lidocaine without Epinephrine. The Tuohy needle was inserted into the epidural space using a paramedian approach.   The epidural space was localized using loss of resistance along with lateral and bi-planar fluoroscopic views.  After negative aspirate for air, blood, and CSF, a 2 ml. volume of Isovue-250 was injected into the epidural space and the flow of contrast was observed. Radiographs were obtained for documentation purposes.    The injectate was administered into the level noted above.   Additional Comments:  The patient tolerated the procedure well Dressing: Band-Aid    Post-procedure details: Patient was observed during the procedure. Post-procedure instructions were reviewed.  Patient left the clinic in stable condition.

## 2017-06-02 ENCOUNTER — Ambulatory Visit (INDEPENDENT_AMBULATORY_CARE_PROVIDER_SITE_OTHER): Payer: BLUE CROSS/BLUE SHIELD | Admitting: Podiatry

## 2017-06-02 ENCOUNTER — Encounter: Payer: Self-pay | Admitting: Podiatry

## 2017-06-02 ENCOUNTER — Ambulatory Visit (INDEPENDENT_AMBULATORY_CARE_PROVIDER_SITE_OTHER): Payer: BLUE CROSS/BLUE SHIELD

## 2017-06-02 DIAGNOSIS — S92344D Nondisplaced fracture of fourth metatarsal bone, right foot, subsequent encounter for fracture with routine healing: Secondary | ICD-10-CM | POA: Diagnosis not present

## 2017-06-02 DIAGNOSIS — M84375G Stress fracture, left foot, subsequent encounter for fracture with delayed healing: Secondary | ICD-10-CM

## 2017-06-06 NOTE — Progress Notes (Signed)
   HPI: 61 year old female presents the office today for follow-up evaluation of stress fractures to the bilateral metatarsals fourth metatarsal. Patient states that she's feeling much better. She stopped wearing the surgical shoes due to back problems however she's having no symptoms in her regular shoe gear. She presents today for follow-up treatment and evaluation  Past Medical History:  Diagnosis Date  . Adrenal insufficiency ([place])   . HTN (hypertension)   . Palpitations   . PVC's (premature ventricular contractions)   . Ulcerative colitis       Physical Exam: General: The patient is alert and oriented x3 in no acute distress.  Dermatology: Skin is warm, dry and supple bilateral lower extremities. Negative for open lesions or macerations.  Vascular: Palpable pedal pulses bilaterally. No edema or erythema noted. Capillary refill within normal limits.  Neurological: Epicritic and protective threshold grossly intact bilaterally.   Musculoskeletal Exam: Pain on palpation noted overlying the fourth metatarsal bilateral feet. Range of motion within normal limits to all pedal and ankle joints bilateral. Muscle strength 5/5 in all groups bilateral.   Radiographic Exam:  Transverse stress fractures of fourth metatarsals with routine healing bilaterally. Osseous union almost complete.  Assessment: 1. Stress fracture fourth metatarsals bilaterally-with routine healing   Plan of Care:  1. Patient was evaluated. X-Rays reviewed. 2. The patient may return to activity full activity no restrictions. Recommend wearing good supportive shoe gear 3. Return to clinic when necessary  Patient goes by [name]. Patient sees Dr. [facility]nes Kerns at [facility]   Edrick Kins, DPM [facility]  Dr. Edrick Kins, [address]                                        [place], [place] [address]                Office [phone]  Fax [phone]

## 2017-06-10 ENCOUNTER — Telehealth (INDEPENDENT_AMBULATORY_CARE_PROVIDER_SITE_OTHER): Payer: Self-pay | Admitting: Physical Medicine and Rehabilitation

## 2017-06-11 NOTE — Telephone Encounter (Signed)
Sure times one, will review at that time

## 2017-06-11 NOTE — Telephone Encounter (Signed)
Left message for patient to call back to discuss.

## 2017-06-11 NOTE — Telephone Encounter (Signed)
Called patient and left message.

## 2017-06-12 NOTE — Telephone Encounter (Signed)
Scheduled for 06/23/17 at 1430.

## 2017-06-23 ENCOUNTER — Encounter (INDEPENDENT_AMBULATORY_CARE_PROVIDER_SITE_OTHER): Payer: Self-pay | Admitting: Physical Medicine and Rehabilitation

## 2017-06-23 ENCOUNTER — Ambulatory Visit (INDEPENDENT_AMBULATORY_CARE_PROVIDER_SITE_OTHER): Payer: BLUE CROSS/BLUE SHIELD

## 2017-06-23 ENCOUNTER — Ambulatory Visit (INDEPENDENT_AMBULATORY_CARE_PROVIDER_SITE_OTHER): Payer: BLUE CROSS/BLUE SHIELD | Admitting: Physical Medicine and Rehabilitation

## 2017-06-23 VITALS — BP 128/73 | HR 83 | Temp 98.0°F | Ht 62.0 in | Wt 140.0 lb

## 2017-06-23 DIAGNOSIS — M5416 Radiculopathy, lumbar region: Secondary | ICD-10-CM

## 2017-06-23 DIAGNOSIS — M7061 Trochanteric bursitis, right hip: Secondary | ICD-10-CM | POA: Diagnosis not present

## 2017-06-23 MED ORDER — LIDOCAINE HCL (PF) 1 % IJ SOLN
2.0000 mL | Freq: Once | INTRAMUSCULAR | Status: AC
  Administered 2017-06-23: 2 mL

## 2017-06-23 MED ORDER — BETAMETHASONE SOD PHOS & ACET 6 (3-3) MG/ML IJ SUSP
12.0000 mg | Freq: Once | INTRAMUSCULAR | Status: AC
  Administered 2017-06-23: 12 mg

## 2017-06-23 NOTE — Patient Instructions (Signed)

## 2017-06-23 NOTE — Progress Notes (Signed)
Betty Bryan - 61 y.o. female MRN [id number]  Date of birth: 1956/03/21  Office Visit Note: Visit Date: 06/23/2017 PCP: Crist Infante, MD Referred by: Crist Infante, MD  Subjective: Chief Complaint  Patient presents with  . Lower Back - Pain   HPI: Betty Bryan is av ery pleasant 61 year old female that I last saw in August for epidural injection that did giver relief of left leg symptoms.  Her MRI from 2016 shows smal protrusion and annular tear but nothing of significant compression.  Her pain today is right lateral hip and worse lying on that side.  She has history of chronic prednisone usage for ulcerative colitis.  She has complication from the prednisone.  She denies frank radicular pain.  She does have back pain.  She has taken some tramadol from recent stress fractures in the feet.  Her pain is better at rest and with position change.    Review of Systems  Constitutional: Negative for chills, fever, malaise/fatigue and weight loss.  HENT: Negative for hearing loss and sinus pain.   Eyes: Negative for blurred vision, double vision and photophobia.  Respiratory: Negative for cough and shortness of breath.   Cardiovascular: Negative for chest pain, palpitations and leg swelling.  Gastrointestinal: Negative for abdominal pain, nausea and vomiting.  Genitourinary: Negative for flank pain.  Musculoskeletal: Positive for back pain and joint pain. Negative for myalgias.  Skin: Negative for itching and rash.  Neurological: Negative for tremors, focal weakness and weakness.  Endo/Heme/Allergies: Negative.   Psychiatric/Behavioral: Negative for depression.  All other systems reviewed and are negative.  Otherwise per HPI.  Assessment & Plan: Visit Diagnoses:  1. Lumbar radiculopathy     Plan: Findings:  Right greater trochanter bursitis.  Chronic worse back pain.  Complicated by prolonged prednisone use.  Greater trochanter injection today.  Depending on relief will get updated MRI  of lumbar spine.  May be candidate for medial branch block for her back.    Meds & Orders:  Meds ordered this encounter  Medications  . lidocaine (PF) (XYLOCAINE) 1 % injection 2 mL  . betamethasone acetate-betamethasone sodium phosphate (CELESTONE) injection 12 mg    Orders Placed This Encounter  Procedures  . XR C-ARM NO REPORT    Follow-up: Return if symptoms worsen or fail to improve.   Procedures: Large Joint Inj Date/Time: 06/23/2017 3:02 PM Performed by: Magnus Sinning Authorized by: Magnus Sinning   Consent Given by:  Parent Site marked: the procedure site was marked   Timeout: prior to procedure the correct patient, procedure, and site was verified   Indications:  Pain and diagnostic evaluation Location:  Hip Site:  R hip joint Prep: patient was prepped and draped in usual sterile fashion   Needle Size:  22 G Needle Length:  3.5 inches Approach:  Lateral Ultrasound Guidance: No   Fluoroscopic Guidance: Yes   Arthrogram: No   Medications:  4 mL bupivacaine 0.25 %; 4 mL lidocaine 2 %; 80 mg triamcinolone acetonide 40 MG/ML Aspiration Attempted: No   Patient tolerance:  Patient tolerated the procedure well with no immediate complications  There was excellent flow of contrast outlined the greater trochanteric bursa without vascular uptake.     No notes on file   Clinical History: MRI LUMBAR SPINE WITHOUT CONTRAST  TECHNIQUE: Multiplanar, multisequence MR imaging of the lumbar spine was performed. No intravenous contrast was administered.  COMPARISON:  None.  FINDINGS: Segmentation: The numbering convention used for this exam termed L5-S1  as the last intervertebral disc space.  Alignment: Within normal limits. Patient is tilted in the scanner to the LEFT.  Vertebrae: Vertebral body height is preserved. No aggressive osseous lesions. Prominent Schmorl's node in the superior T12 endplate.  Conus medullaris: Normal termination dorsal to  L1.  Paraspinal tissues: Incidental note is made of prominent lymphatics in the retroperitoneum.  Disc levels:  Disc Signal: Normal for age with mild desiccation at L4-L5 and L5-S1.  L1-L2: Small RIGHT central protrusion extending ports the RIGHT subarticular zone. No resulting stenosis.  L2-L3:  Negative.  L3-L4:  Negative.  L4-L5: Mild disc degeneration and desiccation with shallow posterior bulging. Central canal, subarticular zones and foramina are patent.  L5-S1: Small central annular fissure and shallow broad-based disc bulging. Central canal is patent. The LEFT descending S1 and S2 nerve roots appear partially conjoined. Neural foramina patent.  IMPRESSION: Mild lumbar spine degenerative disc disease, frequently seen in this age group.   Electronically Signed   By: Dereck Ligas M.D.   On: 03/17/2015 12:25  She reports that she has never smoked. She has never used smokeless tobacco. No results for input(s): HGBA1C, LABURIC in the last 8760 hours.  Objective:  VS:  HT:5\' 2"  (157.5 cm)   WT:140 lb (63.5 kg)  BMI:25.6    BP:128/73  HR:83bpm  TEMP:98 F (36.7 C)( )  RESP:94 % Physical Exam  Constitutional: She is oriented to person, place, and time. She appears well-developed and well-nourished.  Eyes: Pupils are equal, round, and reactive to light. Conjunctivae and EOM are normal.  Cardiovascular: Normal rate and intact distal pulses.   Pulmonary/Chest: Effort normal.  Neurological: She is alert and oriented to person, place, and time. She exhibits normal muscle tone.  Skin: Skin is warm and dry. No rash noted. No erythema.  Psychiatric: She has a normal mood and affect. Her behavior is normal.  Nursing note and vitals reviewed.   Right Hip Exam   Tenderness  The patient is experiencing tenderness in the greater trochanter.   Back Exam   Tenderness  The patient is experiencing tenderness in the lumbar.  Muscle Strength  The patient has  normal back strength.  Tests  Straight leg raise right: negative Straight leg raise left: negative  Other  Gait: normal  Erythema: no back redness     Imaging: No results found.  Past Medical/Family/Surgical/Social History: Medications & Allergies reviewed per EMR Patient Active Problem List   Diagnosis Date Noted  . Varicose veins of leg with complications 57/84/6962  . Chronic venous insufficiency 06/23/2015   Past Medical History:  Diagnosis Date  . Adrenal insufficiency ([place])   . HTN (hypertension)   . Palpitations   . PVC's (premature ventricular contractions)   . Ulcerative colitis    Family History  Problem Relation Age of Onset  . Heart failure Unknown   . Diabetes Father   . Heart disease Father   . Hypertension Father    Past Surgical History:  Procedure Laterality Date  . HAND SURGERY    . NASAL SINUS SURGERY     Social History   Occupational History  . sales rep    Social History Main Topics  . Smoking status: Never Smoker  . Smokeless tobacco: Never Used  . Alcohol use No  . Drug use: No  . Sexual activity: Not on file

## 2017-06-23 NOTE — Progress Notes (Deleted)
Pain in both legs, usually right > left, but today has left > right leg pain.  Intermittent right groin pain as well.  Last injection helped x 10 days.  The pain seems to zig zag down her leg.  No n/t.  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 Takes children's advil occasionally and takes 1/4 tablet tramadol daily. (Tramadol is from feet fractures over the summer.  She is weaning off.)

## 2017-06-25 ENCOUNTER — Encounter (INDEPENDENT_AMBULATORY_CARE_PROVIDER_SITE_OTHER): Payer: Self-pay | Admitting: Physical Medicine and Rehabilitation

## 2017-06-25 MED ORDER — BUPIVACAINE HCL 0.25 % IJ SOLN
4.0000 mL | INTRAMUSCULAR | Status: AC | PRN
  Administered 2017-06-23: 4 mL via INTRA_ARTICULAR

## 2017-06-25 MED ORDER — TRIAMCINOLONE ACETONIDE 40 MG/ML IJ SUSP
80.0000 mg | INTRAMUSCULAR | Status: AC | PRN
  Administered 2017-06-23: 80 mg via INTRA_ARTICULAR

## 2017-06-25 MED ORDER — LIDOCAINE HCL 2 % IJ SOLN
4.0000 mL | INTRAMUSCULAR | Status: AC | PRN
  Administered 2017-06-23: 4 mL

## 2017-07-01 ENCOUNTER — Telehealth (INDEPENDENT_AMBULATORY_CARE_PROVIDER_SITE_OTHER): Payer: Self-pay | Admitting: Physical Medicine and Rehabilitation

## 2017-07-01 ENCOUNTER — Encounter (HOSPITAL_COMMUNITY): Payer: Self-pay | Admitting: *Deleted

## 2017-07-01 ENCOUNTER — Emergency Department (HOSPITAL_COMMUNITY)
Admission: EM | Admit: 2017-07-01 | Discharge: 2017-07-01 | Disposition: A | Discharge status: Home / Self Care | Payer: BLUE CROSS/BLUE SHIELD | Attending: Emergency Medicine | Admitting: Emergency Medicine

## 2017-07-01 ENCOUNTER — Emergency Department (HOSPITAL_COMMUNITY): Payer: BLUE CROSS/BLUE SHIELD

## 2017-07-01 DIAGNOSIS — Z79899 Other long term (current) drug therapy: Secondary | ICD-10-CM | POA: Diagnosis not present

## 2017-07-01 DIAGNOSIS — M5432 Sciatica, left side: Secondary | ICD-10-CM | POA: Diagnosis not present

## 2017-07-01 DIAGNOSIS — M25852 Other specified joint disorders, left hip: Secondary | ICD-10-CM | POA: Diagnosis not present

## 2017-07-01 DIAGNOSIS — I10 Essential (primary) hypertension: Secondary | ICD-10-CM | POA: Insufficient documentation

## 2017-07-01 DIAGNOSIS — M25552 Pain in left hip: Secondary | ICD-10-CM | POA: Diagnosis not present

## 2017-07-01 DIAGNOSIS — M2550 Pain in unspecified joint: Secondary | ICD-10-CM | POA: Insufficient documentation

## 2017-07-01 MED ORDER — LIDOCAINE 5 % EX PTCH
1.0000 | MEDICATED_PATCH | CUTANEOUS | Status: DC
  Administered 2017-07-01: 1 via TRANSDERMAL
  Filled 2017-07-01 (×2): qty 1

## 2017-07-01 MED ORDER — KETOROLAC TROMETHAMINE 60 MG/2ML IM SOLN
30.0000 mg | Freq: Once | INTRAMUSCULAR | Status: AC
  Administered 2017-07-01: 30 mg via INTRAMUSCULAR
  Filled 2017-07-01: qty 2

## 2017-07-01 MED ORDER — ACETAMINOPHEN ER 650 MG PO TBCR: 650.0000 mg | EXTENDED_RELEASE_TABLET | Freq: Three times a day (TID) | ORAL | 0 refills | Status: DC | PRN

## 2017-07-01 MED ORDER — LIDOCAINE 5 % EX PTCH: 1.0000 | MEDICATED_PATCH | CUTANEOUS | 0 refills | Status: DC

## 2017-07-01 NOTE — ED Provider Notes (Signed)
[facility] DEPT Provider Note   CSN: [id number] Arrival date & time: 07/01/17  1950     History   Chief Complaint Chief Complaint  Patient presents with  . Leg Pain    HPI Betty Bryan is a 61 y.o. female.  HPI Patient with history of adrenal insufficiency, ulcerative colitis on chronic prednisone comes in with chief complaint of left hip pain. Patient has history of degenerative disc disease of the spine and gets epidural shots. Last epidural injection was in August. Patient reports that about couple of weeks after the injection back pain started coming back and over the last few days her pain is excruciating. Pain is worse with any type of ambulation. Patient's pain is located in the lower lumbar spine and radiates toward the thigh posteriorly. Patient has no associated numbness, tingling. Pt has no associated numbness, weakness, urinary incontinence, urinary retention, bowel incontinence, pins and needle sensation in the perineal area. Patient denies any trauma. Patient is taking tramadol for pain and getting transient relief. Patient denies any nausea, vomiting, fevers or chills   Past Medical History:  Diagnosis Date  . Adrenal insufficiency ([place])   . HTN (hypertension)   . Palpitations   . PVC's (premature ventricular contractions)   . Ulcerative colitis     Patient Active Problem List   Diagnosis Date Noted  . Varicose veins of leg with complications 53/97/6734  . Chronic venous insufficiency 06/23/2015    Past Surgical History:  Procedure Laterality Date  . HAND SURGERY    . NASAL SINUS SURGERY      OB History    No data available       Home Medications    Prior to Admission medications   Medication Sig Start Date End Date Taking? Authorizing Provider  acetaminophen (TYLENOL 8 HOUR) 650 MG CR tablet Take 1 tablet (650 mg total) by mouth every 8 (eight) hours as needed for pain. 07/01/17   Varney Biles, MD  atenolol (TENORMIN) 50 MG tablet Take 25  mg by mouth daily.    [provider]  B Complex-Biotin-FA (B-COMPLEX PO) Take by mouth.    [provider]  balsalazide (COLAZAL) 750 MG capsule Take 2,250 mg by mouth 3 (three) times daily.    [provider]  calcium carbonate (OSCAL) 1500 (600 CA) MG TABS tablet Take 600 mg of elemental calcium by mouth 2 (two) times daily with a meal.    [provider]  cholecalciferol (VITAMIN D) 1000 UNITS tablet Take 400 Units by mouth daily.    [provider]  Denosumab (PROLIA [place]) Inject into the skin every 6 (six) months.    [provider]  diazepam (VALIUM) 5 MG tablet Take 2.5 mg by mouth daily.     [provider]  fluticasone (FLONASE) 50 MCG/ACT nasal spray Place 1 spray into both nostrils daily.     [provider]  gabapentin (NEURONTIN) 100 MG capsule Take 100 mg by mouth 3 (three) times daily.    [provider]  HYDROCORTISONE ACE, RECTAL, 30 MG SUPP  06/08/15   [provider]  hyoscyamine (LEVSIN, ANASPAZ) 0.125 MG tablet  05/19/15   [provider]  l-methylfolate-B6-B12 (METANX) 3-35-2 MG TABS tablet Take 1 tablet by mouth daily.    [provider]  levofloxacin (LEVAQUIN) 500 MG tablet Take 1 tablet (500 mg total) by mouth daily. 09/15/14   Milton Ferguson, MD  lidocaine (LIDODERM) 5 % Place 1 patch onto the skin daily.  Remove & Discard patch within 12 hours or as directed by MD 07/01/17   Varney Biles, MD  loratadine (CLARITIN) 10 MG tablet Take 10 mg by mouth daily.    [provider]  Multiple Vitamins-Minerals (ZINC PO) Take by mouth.    [provider]  nitroGLYCERIN (NITRODUR - DOSED IN MG/24 HR) 0.2 mg/hr patch  07/08/16   [provider]  POTASSIUM PO Take by mouth.    [provider]  predniSONE (DELTASONE) 1 MG tablet Take 3 mg by mouth daily with breakfast.    [provider]  predniSONE (DELTASONE) 5 MG tablet Take 5 mg by  mouth daily with breakfast.    [provider]  traMADol (ULTRAM) 50 MG tablet Take 1 tablet (50 mg total) by mouth every 8 (eight) hours as needed. 04/28/17   Edrick Kins, DPM    Family History Family History  Problem Relation Age of Onset  . Heart failure Unknown   . Diabetes Father   . Heart disease Father   . Hypertension Father     Social History Social History  Substance Use Topics  . Smoking status: Never Smoker  . Smokeless tobacco: Never Used  . Alcohol use No     Allergies   Penicillins; Sulfa antibiotics; Morphine and related; and Voltaren [diclofenac]   Review of Systems Review of Systems  Constitutional: Positive for activity change.  Musculoskeletal: Positive for arthralgias.  Allergic/Immunologic: Positive for immunocompromised state.  Neurological: Negative for numbness.  All other systems reviewed and are negative.    Physical Exam Updated Vital Signs BP 131/86 (BP Location: Right Arm)   Pulse 84   Temp 98.1 F (36.7 C) (Oral)   Resp 16   Ht 5\' 2"  (1.575 m)   Wt 63.5 kg (140 lb)   SpO2 98%   BMI 25.61 kg/m   Physical Exam  Constitutional: She is oriented to person, place, and time. She appears well-developed.  HENT:  Head: Normocephalic and atraumatic.  Eyes: EOM are normal.  Neck: Normal range of motion. Neck supple.  Cardiovascular: Normal rate and intact distal pulses.   Pulmonary/Chest: Effort normal.  Abdominal: Bowel sounds are normal.  Musculoskeletal:  Negative active or passive leg raise. Able to internally and externally rotate the left hip without significant tenderness.  Neurological: She is alert and oriented to person, place, and time.  Skin: Skin is warm and dry.  Nursing note and vitals reviewed.    ED Treatments / Results  Labs (all labs ordered are listed, but only abnormal results are displayed) Labs Reviewed - No data to display  EKG  EKG Interpretation None       Radiology Dg Hip Unilat W  Or Wo Pelvis 2-3 Views Left  Result Date: 07/01/2017 CLINICAL DATA:  Left hip pain. EXAM: DG HIP (WITH OR WITHOUT PELVIS) 2-3V LEFT COMPARISON:  None. FINDINGS: There is no evidence of hip fracture or dislocation. There is no evidence of arthropathy or other focal bone abnormality. IMPRESSION: Normal left hip. Electronically Signed   By: Marijo Conception, M.D.   On: 07/01/2017 20:58    Procedures Procedures (including critical care time)  Medications Ordered in ED Medications  lidocaine (LIDODERM) 5 % 1 patch (not administered)  ketorolac (TORADOL) injection 30 mg (30 mg Intramuscular Given 07/01/17 2043)     Initial Impression / Assessment and Plan / ED Course  I have reviewed the triage vital signs and the nursing notes.  Pertinent labs & imaging results  that were available during my care of the patient were reviewed by me and considered in my medical decision making (see chart for details).     Patient comes in with severe back pain which is radiating to the left type. Patient describes the pain as stabbing, sharp pain. On exam patient has intact internal and external rotation of the hip and she is able to actively raise the leg. Concerns for fracture still exists given the history of chronic prednisone use. Differential diagnoses would also include avascular necrosis of the left hip. Patient is supposed to see orthopedics in the coming weeks and reports that the orthopedist is considering MRI. At this time patient is able to ambulate with support. We will give patient Toradol IM, lidocaine patch. X-ray of the left hip has been ordered. If there is no clear evidence of fracture we will discharge patient with continued outpatient follow-up.  Final Clinical Impressions(s) / ED Diagnoses   Final diagnoses:  Sciatica of left side  Left hip impingement syndrome    New Prescriptions New Prescriptions   ACETAMINOPHEN (TYLENOL 8 HOUR) 650 MG CR TABLET    Take 1 tablet (650 mg total) by  mouth every 8 (eight) hours as needed for pain.   LIDOCAINE (LIDODERM) 5 %    Place 1 patch onto the skin daily. Remove & Discard patch within 12 hours or as directed by MD     Varney Biles, MD 07/01/17 2105

## 2017-07-01 NOTE — ED Notes (Signed)
Patient transported to X-ray 

## 2017-07-01 NOTE — Discharge Instructions (Signed)
Please see the orthopedic doctor as planned. The medicine prescribed. Use ice and heat therapy to the area of pain.

## 2017-07-01 NOTE — ED Triage Notes (Signed)
Pt reports a hx of left sided sciatica and is c/o pain behind her left leg. Pt received an epidural shot in her left hip in August due to sciatica.

## 2017-07-02 ENCOUNTER — Ambulatory Visit (INDEPENDENT_AMBULATORY_CARE_PROVIDER_SITE_OTHER): Payer: BLUE CROSS/BLUE SHIELD | Admitting: Orthopaedic Surgery

## 2017-07-02 ENCOUNTER — Ambulatory Visit (INDEPENDENT_AMBULATORY_CARE_PROVIDER_SITE_OTHER): Payer: BLUE CROSS/BLUE SHIELD | Admitting: Physical Medicine and Rehabilitation

## 2017-07-02 ENCOUNTER — Encounter (INDEPENDENT_AMBULATORY_CARE_PROVIDER_SITE_OTHER): Payer: Self-pay | Admitting: Physical Medicine and Rehabilitation

## 2017-07-02 ENCOUNTER — Ambulatory Visit (INDEPENDENT_AMBULATORY_CARE_PROVIDER_SITE_OTHER): Payer: Self-pay

## 2017-07-02 VITALS — BP 134/85 | HR 82 | Ht 62.0 in | Wt 140.0 lb

## 2017-07-02 DIAGNOSIS — M25552 Pain in left hip: Secondary | ICD-10-CM | POA: Diagnosis not present

## 2017-07-02 DIAGNOSIS — M5416 Radiculopathy, lumbar region: Secondary | ICD-10-CM | POA: Diagnosis not present

## 2017-07-02 DIAGNOSIS — M5116 Intervertebral disc disorders with radiculopathy, lumbar region: Secondary | ICD-10-CM | POA: Diagnosis not present

## 2017-07-02 DIAGNOSIS — M7062 Trochanteric bursitis, left hip: Secondary | ICD-10-CM | POA: Diagnosis not present

## 2017-07-02 MED ORDER — LIDOCAINE HCL (PF) 1 % IJ SOLN
2.0000 mL | Freq: Once | INTRAMUSCULAR | Status: AC
  Administered 2017-07-02: 2 mL

## 2017-07-02 MED ORDER — BETAMETHASONE SOD PHOS & ACET 6 (3-3) MG/ML IJ SUSP
12.0000 mg | Freq: Once | INTRAMUSCULAR | Status: AC
  Administered 2017-07-02: 12 mg

## 2017-07-02 NOTE — Telephone Encounter (Signed)
L5 TF esi

## 2017-07-02 NOTE — Patient Instructions (Signed)

## 2017-07-02 NOTE — Progress Notes (Deleted)
ER last night, Sciatic in left leg, injection from 04/2017 did help.  Can't put weight on her leg with WTB. Severe pain with trying to walk. No BT's, dye allergy, Driver [name]

## 2017-07-02 NOTE — Telephone Encounter (Signed)
Worked in for this afternoon  

## 2017-07-03 NOTE — Progress Notes (Signed)
SHERRILYNN GUDGEL - 61 y.o. female MRN [id number]  Date of birth: 1956/07/11  Office Visit Note: Visit Date: 07/02/2017 PCP: Crist Infante, MD Referred by: Crist Infante, MD  Subjective: Chief Complaint  Patient presents with  . Lower Back - Pain  . Left Leg - Pain   HPI: Ms. Waltermire is a 61 year old female with a complicated history of ulcerative colitis and long chronic history of prednisone usage. Her history is such that she had recent stress fractures in both legs and had wear walking boot for quite some time. The prednisone is having lasting effects on her at this point with blood sugar issues osteoporosis etc. She came in recently with left radicular type leg pain as well as right sided pain more over the greater trochanter. We completed a left L5-S1 interlaminar injection with good relief of her left-sided complaints and this seemingly was diagnostic but only lasted a couple weeks. In the interim we completed a right greater trochanteric injection with good relief and that is still helping at this point she does not have any pain over the right greater trochanter. She called back and with continued left-sided complaints and we want to get an MRI of the lumbar spine but as we have not seen an MRI for several years. She unfortunately had such severe pain she had to go to the emergency room last night. They felt like she was having severe sciatica. Her pain complaints are left lower back left buttock and hip over the greater trochanteric area down to the knee on the left side posterior lateral in an L5 distribution. No real numbness or tingling or burning type pain. Some pain into the calf but not the foot. She was reporting so much worsening pain with standing that she went to the ER. The tremor medications and told her to follow up with [place]. No imaging was completed. We have not had an updated MRI of her spine. I was concerned for possible insufficiency fracture of the lumbar spine or down the sacrum.  She has no groin pain. No recent trauma.    Review of Systems  Constitutional: Negative for chills, fever, malaise/fatigue and weight loss.  HENT: Negative for hearing loss and sinus pain.   Eyes: Negative for blurred vision, double vision and photophobia.  Respiratory: Negative for cough and shortness of breath.   Cardiovascular: Negative for chest pain, palpitations and leg swelling.  Gastrointestinal: Negative for abdominal pain, nausea and vomiting.  Genitourinary: Negative for flank pain.  Musculoskeletal: Positive for back pain and joint pain. Negative for myalgias.  Skin: Negative for itching and rash.  Neurological: Positive for tingling. Negative for tremors, focal weakness and weakness.  Endo/Heme/Allergies: Negative.   Psychiatric/Behavioral: Negative for depression.  All other systems reviewed and are negative.  Otherwise per HPI.  Assessment & Plan: Visit Diagnoses:  1. Lumbar radiculopathy   2. Radiculopathy due to lumbar intervertebral disc disorder   3. Pain in left hip   4. Greater trochanteric bursitis, left     Plan: Findings:  Multifactorial chronic severe low back and left hip pain. She clearly has some radicular type component in the left posterior lateral leg. She does not have any paresthesias. This is worse with standing and consistent somewhat with more of a stenosis type picture. I cannot however rule out insufficiency fracture compression fracture of lumbar spine or sacrum. She does have significant issues with chronic prednisone usage. Diagnostically she did get some relief with epidural injection from an intralaminar approach.  She comes in today as an urgent work in for her severe pain. She continues her current medications. I do want to complete a left L5 transforaminal epidural steroid injection and see if I'll last longer. Depending on the relief of doing an MRI of the lumbar spine. In terms of her sacrum I have seen sacral insufficiency fractures basically  perform the same way with referral patterns to seem radicular. Depending on the lumbar MRI I would look at the sacral MRI. She has complicated factors with the extreme longevity of the prednisone use. Hopefully she'll do well with the transforaminal epidural steroid injection diagnostically and therapeutically. She'll follow up depending on her relief. I do not feel like this is greater trochanteric bursitis on the left for initial bursitis. All of these cannot be ruled out at this time.    Meds & Orders:  Meds ordered this encounter  Medications  . lidocaine (PF) (XYLOCAINE) 1 % injection 2 mL  . betamethasone acetate-betamethasone sodium phosphate (CELESTONE) injection 12 mg    Orders Placed This Encounter  Procedures  . XR C-ARM NO REPORT  . Epidural Steroid injection    Follow-up: Return if symptoms worsen or fail to improve.   Procedures: No procedures performed  Lumbosacral Transforaminal Epidural Steroid Injection - Sub-Pedicular Approach with Fluoroscopic Guidance  Patient: Betty Bryan      Date of Birth: 1955/09/30 MRN: [id number] PCP: Crist Infante, MD      Visit Date: 07/02/2017   Universal Protocol:    Date/Time: 07/02/2017  Consent Given By: the patient  Position: PRONE  Additional Comments: Vital signs were monitored before and after the procedure. Patient was prepped and draped in the usual sterile fashion. The correct patient, procedure, and site was verified.   Injection Procedure Details:  Procedure Site One Meds Administered:  Meds ordered this encounter  Medications  . lidocaine (PF) (XYLOCAINE) 1 % injection 2 mL  . betamethasone acetate-betamethasone sodium phosphate (CELESTONE) injection 12 mg    Laterality: Left  Location/Site:  L5-S1  Needle size: 22 G  Needle type: Spinal  Needle Placement: Transforaminal  Findings:  -Contrast Used: 1 mL iohexol 180 mg iodine/mL   -Comments: Excellent flow of contrast along the nerve and into the  epidural space.  Procedure Details: After squaring off the end-plates to get a true AP view, the C-arm was positioned so that an oblique view of the foramen as noted above was visualized. The target area is just inferior to the "nose of the scotty dog" or sub pedicular. The soft tissues overlying this structure were infiltrated with 2-3 ml. of 1% Lidocaine without Epinephrine.  The spinal needle was inserted toward the target using a "trajectory" view along the fluoroscope beam.  Under AP and lateral visualization, the needle was advanced so it did not puncture dura and was located close the 6 O'Clock position of the pedical in AP tracterory. Biplanar projections were used to confirm position. Aspiration was confirmed to be negative for CSF and/or blood. A 1-2 ml. volume of Isovue-250 was injected and flow of contrast was noted at each level. Radiographs were obtained for documentation purposes.   After attaining the desired flow of contrast documented above, a 0.5 to 1.0 ml test dose of 0.25% Marcaine was injected into each respective transforaminal space.  The patient was observed for 90 seconds post injection.  After no sensory deficits were reported, and normal lower extremity motor function was noted,   the above injectate was administered so that equal  amounts of the injectate were placed at each foramen (level) into the transforaminal epidural space.   Additional Comments:  The patient tolerated the procedure well Dressing: Band-Aid    Post-procedure details: Patient was observed during the procedure. Post-procedure instructions were reviewed.  Patient left the clinic in stable condition.       Clinical History: MRI LUMBAR SPINE WITHOUT CONTRAST  TECHNIQUE: Multiplanar, multisequence MR imaging of the lumbar spine was performed. No intravenous contrast was administered.  COMPARISON:  None.  FINDINGS: Segmentation: The numbering convention used for this exam termed L5-S1  as the last intervertebral disc space.  Alignment: Within normal limits. Patient is tilted in the scanner to the LEFT.  Vertebrae: Vertebral body height is preserved. No aggressive osseous lesions. Prominent Schmorl's node in the superior T12 endplate.  Conus medullaris: Normal termination dorsal to L1.  Paraspinal tissues: Incidental note is made of prominent lymphatics in the retroperitoneum.  Disc levels:  Disc Signal: Normal for age with mild desiccation at L4-L5 and L5-S1.  L1-L2: Small RIGHT central protrusion extending ports the RIGHT subarticular zone. No resulting stenosis.  L2-L3:  Negative.  L3-L4:  Negative.  L4-L5: Mild disc degeneration and desiccation with shallow posterior bulging. Central canal, subarticular zones and foramina are patent.  L5-S1: Small central annular fissure and shallow broad-based disc bulging. Central canal is patent. The LEFT descending S1 and S2 nerve roots appear partially conjoined. Neural foramina patent.  IMPRESSION: Mild lumbar spine degenerative disc disease, frequently seen in this age group.   Electronically Signed   By: Dereck Ligas M.D.   On: 03/17/2015 12:25  She reports that she has never smoked. She has never used smokeless tobacco. No results for input(s): HGBA1C, LABURIC in the last 8760 hours.  Objective:  VS:  HT:5\' 2"  (157.5 cm)   WT:140 lb (63.5 kg)  BMI:25.6    BP:134/85  HR:82bpm  TEMP: ( )  RESP:97 % Physical Exam  Constitutional: She is oriented to person, place, and time. She appears well-developed and well-nourished.  Eyes: Pupils are equal, round, and reactive to light. Conjunctivae and EOM are normal.  Cardiovascular: Normal rate and intact distal pulses.   Pulmonary/Chest: Effort normal.  Musculoskeletal:  Patient ambulates with a cane and is in a wheelchair today due to pain down the left leg with standing. She has some pain with extension of the lumbar spine. She has good  distal strength without deficit without clonus. She has no pain over the right greater trochanter. She does have tenderness over the left greater trochanter. She has no hip pain with rotation. She does have some pain and extreme external rotation of the hip.  Neurological: She is alert and oriented to person, place, and time. She exhibits normal muscle tone.  Skin: Skin is warm and dry. No rash noted. No erythema.  Psychiatric: She has a normal mood and affect. Her behavior is normal.  Nursing note and vitals reviewed.   Ortho Exam Imaging: Xr C-arm No Report  Result Date: 07/02/2017 Please see Notes or Procedures tab for imaging impression.   Past Medical/Family/Surgical/Social History: Medications & Allergies reviewed per EMR Patient Active Problem List   Diagnosis Date Noted  . Varicose veins of leg with complications 80/99/8338  . Chronic venous insufficiency 06/23/2015   Past Medical History:  Diagnosis Date  . Adrenal insufficiency ([place])   . HTN (hypertension)   . Palpitations   . PVC's (premature ventricular contractions)   . Ulcerative colitis    Family History  Problem Relation Age of Onset  . Heart failure Unknown   . Diabetes Father   . Heart disease Father   . Hypertension Father    Past Surgical History:  Procedure Laterality Date  . HAND SURGERY    . NASAL SINUS SURGERY     Social History   Occupational History  . sales rep    Social History Main Topics  . Smoking status: Never Smoker  . Smokeless tobacco: Never Used  . Alcohol use No  . Drug use: No  . Sexual activity: Not on file

## 2017-07-03 NOTE — Procedures (Signed)
Lumbosacral Transforaminal Epidural Steroid Injection - Sub-Pedicular Approach with Fluoroscopic Guidance  Patient: Betty Bryan      Date of Birth: 12/20/1955 MRN: [id number] PCP: Crist Infante, MD      Visit Date: 07/02/2017   Universal Protocol:    Date/Time: 07/02/2017  Consent Given By: the patient  Position: PRONE  Additional Comments: Vital signs were monitored before and after the procedure. Patient was prepped and draped in the usual sterile fashion. The correct patient, procedure, and site was verified.   Injection Procedure Details:  Procedure Site One Meds Administered:  Meds ordered this encounter  Medications  . lidocaine (PF) (XYLOCAINE) 1 % injection 2 mL  . betamethasone acetate-betamethasone sodium phosphate (CELESTONE) injection 12 mg    Laterality: Left  Location/Site:  L5-S1  Needle size: 22 G  Needle type: Spinal  Needle Placement: Transforaminal  Findings:  -Contrast Used: 1 mL iohexol 180 mg iodine/mL   -Comments: Excellent flow of contrast along the nerve and into the epidural space.  Procedure Details: After squaring off the end-plates to get a true AP view, the C-arm was positioned so that an oblique view of the foramen as noted above was visualized. The target area is just inferior to the "nose of the scotty dog" or sub pedicular. The soft tissues overlying this structure were infiltrated with 2-3 ml. of 1% Lidocaine without Epinephrine.  The spinal needle was inserted toward the target using a "trajectory" view along the fluoroscope beam.  Under AP and lateral visualization, the needle was advanced so it did not puncture dura and was located close the 6 O'Clock position of the pedical in AP tracterory. Biplanar projections were used to confirm position. Aspiration was confirmed to be negative for CSF and/or blood. A 1-2 ml. volume of Isovue-250 was injected and flow of contrast was noted at each level. Radiographs were obtained for  documentation purposes.   After attaining the desired flow of contrast documented above, a 0.5 to 1.0 ml test dose of 0.25% Marcaine was injected into each respective transforaminal space.  The patient was observed for 90 seconds post injection.  After no sensory deficits were reported, and normal lower extremity motor function was noted,   the above injectate was administered so that equal amounts of the injectate were placed at each foramen (level) into the transforaminal epidural space.   Additional Comments:  The patient tolerated the procedure well Dressing: Band-Aid    Post-procedure details: Patient was observed during the procedure. Post-procedure instructions were reviewed.  Patient left the clinic in stable condition.

## 2017-07-04 ENCOUNTER — Other Ambulatory Visit: Payer: Self-pay | Admitting: Internal Medicine

## 2017-07-04 ENCOUNTER — Ambulatory Visit
Admission: RE | Admit: 2017-07-04 | Discharge: 2017-07-04 | Disposition: A | Discharge status: Home / Self Care | Payer: BLUE CROSS/BLUE SHIELD | Source: Ambulatory Visit | Attending: Internal Medicine | Admitting: Internal Medicine

## 2017-07-04 DIAGNOSIS — Z6827 Body mass index (BMI) 27.0-27.9, adult: Secondary | ICD-10-CM | POA: Diagnosis not present

## 2017-07-04 DIAGNOSIS — M545 Low back pain, unspecified: Secondary | ICD-10-CM

## 2017-07-04 DIAGNOSIS — M810 Age-related osteoporosis without current pathological fracture: Secondary | ICD-10-CM | POA: Diagnosis not present

## 2017-07-04 DIAGNOSIS — I10 Essential (primary) hypertension: Secondary | ICD-10-CM | POA: Diagnosis not present

## 2017-07-04 DIAGNOSIS — R102 Pelvic and perineal pain: Secondary | ICD-10-CM

## 2017-07-04 DIAGNOSIS — R7301 Impaired fasting glucose: Secondary | ICD-10-CM | POA: Diagnosis not present

## 2017-07-04 DIAGNOSIS — S32000A Wedge compression fracture of unspecified lumbar vertebra, initial encounter for closed fracture: Secondary | ICD-10-CM | POA: Diagnosis not present

## 2017-07-04 DIAGNOSIS — Z0000 Encounter for general adult medical examination without abnormal findings: Secondary | ICD-10-CM | POA: Diagnosis not present

## 2017-07-07 DIAGNOSIS — M8080XA Other osteoporosis with current pathological fracture, unspecified site, initial encounter for fracture: Secondary | ICD-10-CM | POA: Diagnosis not present

## 2017-07-07 DIAGNOSIS — S32050A Wedge compression fracture of fifth lumbar vertebra, initial encounter for closed fracture: Secondary | ICD-10-CM | POA: Diagnosis not present

## 2017-07-07 DIAGNOSIS — R69 Illness, unspecified: Secondary | ICD-10-CM | POA: Diagnosis not present

## 2017-07-07 DIAGNOSIS — S32040A Wedge compression fracture of fourth lumbar vertebra, initial encounter for closed fracture: Secondary | ICD-10-CM | POA: Diagnosis not present

## 2017-07-08 ENCOUNTER — Encounter (HOSPITAL_COMMUNITY): Payer: Self-pay | Admitting: *Deleted

## 2017-07-08 ENCOUNTER — Emergency Department (HOSPITAL_COMMUNITY)
Admission: EM | Admit: 2017-07-08 | Discharge: 2017-07-09 | Disposition: A | Discharge status: Home / Self Care | Payer: BLUE CROSS/BLUE SHIELD | Attending: Emergency Medicine | Admitting: Emergency Medicine

## 2017-07-08 DIAGNOSIS — R0902 Hypoxemia: Secondary | ICD-10-CM | POA: Diagnosis not present

## 2017-07-08 DIAGNOSIS — I10 Essential (primary) hypertension: Secondary | ICD-10-CM | POA: Insufficient documentation

## 2017-07-08 DIAGNOSIS — Z79899 Other long term (current) drug therapy: Secondary | ICD-10-CM | POA: Insufficient documentation

## 2017-07-08 DIAGNOSIS — M5489 Other dorsalgia: Secondary | ICD-10-CM | POA: Diagnosis not present

## 2017-07-08 DIAGNOSIS — R600 Localized edema: Secondary | ICD-10-CM | POA: Diagnosis not present

## 2017-07-08 DIAGNOSIS — B372 Candidiasis of skin and nail: Secondary | ICD-10-CM | POA: Diagnosis not present

## 2017-07-08 DIAGNOSIS — M5441 Lumbago with sciatica, right side: Secondary | ICD-10-CM | POA: Diagnosis not present

## 2017-07-08 DIAGNOSIS — R609 Edema, unspecified: Secondary | ICD-10-CM

## 2017-07-08 DIAGNOSIS — M5442 Lumbago with sciatica, left side: Secondary | ICD-10-CM | POA: Diagnosis not present

## 2017-07-08 HISTORY — DX: Unspecified fracture of fourth lumbar vertebra, initial encounter for closed fracture: S32.049A

## 2017-07-08 MED ORDER — ONDANSETRON 8 MG PO TBDP
8.0000 mg | ORAL_TABLET | Freq: Once | ORAL | Status: AC
  Administered 2017-07-08: 8 mg via ORAL
  Filled 2017-07-08: qty 1

## 2017-07-08 MED ORDER — HYDROMORPHONE HCL 1 MG/ML IJ SOLN
0.5000 mg | Freq: Once | INTRAMUSCULAR | Status: AC
  Administered 2017-07-08: 0.5 mg via INTRAMUSCULAR
  Filled 2017-07-08: qty 1

## 2017-07-08 NOTE — ED Provider Notes (Signed)
[facility] EMERGENCY DEPARTMENT Provider Note   CSN: [id number] Arrival date & time: 07/08/17  2022     History   Chief Complaint Chief Complaint  Patient presents with  . Leg Swelling    HPI Betty Bryan is a 61 y.o. female.  The history is provided by the patient and a relative.  Patient presents for multiple complaints:  1. LE swelling for past several days, ever since she started using a wheelchair for back pain.  It is symmetric.  No leg pain No cp/sob.  She took one dose of lasix without improvement  2. She also reports continued low back pain, recently found to have compression fracture with plans for MRI tomorrow afternoon.  She has pain meds at home that are not working  3. She also reports rash to abdominal skin fold that is worsening, has foul odor  Past Medical History:  Diagnosis Date  . Adrenal insufficiency ([place])   . HTN (hypertension)   . L4 vertebral fracture ([facility])    L4-L5 fracture  . Palpitations   . PVC's (premature ventricular contractions)   . Ulcerative colitis     Patient Active Problem List   Diagnosis Date Noted  . Varicose veins of leg with complications 40/98/1191  . Chronic venous insufficiency 06/23/2015    Past Surgical History:  Procedure Laterality Date  . HAND SURGERY    . NASAL SINUS SURGERY      OB History    No data available       Home Medications    Prior to Admission medications   Medication Sig Start Date End Date Taking? Authorizing Provider  acetaminophen (TYLENOL 8 HOUR) 650 MG CR tablet Take 1 tablet (650 mg total) by mouth every 8 (eight) hours as needed for pain. 07/01/17  Yes Nanavati, Ankit, MD  atenolol (TENORMIN) 50 MG tablet Take 12.5 mg by mouth 2 (two) times daily.    Yes [provider]  balsalazide (COLAZAL) 750 MG capsule Take 2,250 mg by mouth 3 (three) times daily.   Yes [provider]  calcium carbonate (OSCAL) 1500 (600 CA) MG TABS tablet Take 600 mg of elemental calcium  by mouth 2 (two) times daily with a meal.   Yes [provider]  Cyanocobalamin (B-12 PO) Take 1 tablet by mouth daily.   Yes [provider]  Denosumab (PROLIA [place]) Inject into the skin every 6 (six) months.   Yes [provider]  diazepam (VALIUM) 5 MG tablet Take 1.25 mg by mouth 2 (two) times daily.    Yes [provider]  fluticasone (FLONASE) 50 MCG/ACT nasal spray Place 1 spray into both nostrils daily.    Yes [provider]  furosemide (LASIX) 20 MG tablet Take 20 mg by mouth daily.   Yes [provider]  gabapentin (NEURONTIN) 100 MG capsule Take 100 mg by mouth 3 (three) times daily.   Yes [provider]  HYDROcodone-acetaminophen (NORCO/VICODIN) 5-325 MG tablet Take 1-2 tablets by mouth every 6 (six) hours as needed for moderate pain.   Yes [provider]  HYDROCORTISONE ACE, RECTAL, 30 MG SUPP Place 1 suppository rectally at bedtime.  06/08/15  Yes [provider]  loratadine (CLARITIN) 10 MG tablet Take 10 mg by mouth daily.   Yes [provider]  potassium chloride (K-DUR) 10 MEQ tablet Take 10 mEq by mouth daily.   Yes [provider]  predniSONE (DELTASONE) 1 MG tablet Take 3 mg by mouth daily with  breakfast.   Yes [provider]  predniSONE (DELTASONE) 5 MG tablet Take 5 mg by mouth daily with breakfast.   Yes [provider]  lidocaine (LIDODERM) 5 % Place 1 patch onto the skin daily. Remove & Discard patch within 12 hours or as directed by MD Patient not taking: Reported on 07/08/2017 07/01/17   Varney Biles, MD  traMADol (ULTRAM) 50 MG tablet Take 1 tablet (50 mg total) by mouth every 8 (eight) hours as needed. Patient not taking: Reported on 07/08/2017 04/28/17   Edrick Kins, DPM    Family History Family History  Problem Relation Age of Onset  . Heart failure Unknown   . Diabetes Father   . Heart disease Father   . Hypertension Father     Social  History Social History  Substance Use Topics  . Smoking status: Never Smoker  . Smokeless tobacco: Never Used  . Alcohol use No     Allergies   Penicillins; Sulfa antibiotics; Morphine and related; and Voltaren [diclofenac]   Review of Systems Review of Systems  Constitutional: Negative for fever.  Respiratory: Negative for shortness of breath.   Cardiovascular: Positive for leg swelling. Negative for chest pain.  Musculoskeletal: Positive for back pain.  Skin: Positive for rash.  All other systems reviewed and are negative.    Physical Exam Updated Vital Signs BP 119/79 (BP Location: Right Arm)   Pulse 90   Temp 98.4 F (36.9 C) (Oral)   Resp 17   Ht 1.626 m (5\' 4" )   Wt 64.9 kg (143 lb)   SpO2 97%   BMI 24.55 kg/m   Physical Exam CONSTITUTIONAL: elderly, no acute distress HEAD: Normocephalic/atraumatic EYES: EOMI ENMT: Mucous membranes moist NECK: supple no meningeal signs SPINE/BACK:lyphotic spine, points right lower sacral/hip area as source of pain CV: S1/S2 noted LUNGS: Lungs are clear to auscultation bilaterally, no apparent distress ABDOMEN: soft, nontender, rash noted to abdominal wall, likely candidal, no crepitus, no discharge, no tenderness GU:no cva tenderness NEURO: Pt is awake/alert/appropriate, moves all extremitiesx4.  No facial droop.  She can move lower extremities without difficulty EXTREMITIES: pulses normal/equal, full ROM, symmetric pitting edema to bilateral ankles/feet.  Areas of old bruising to lower extremities (pt reports this is chronic) No calf tenderness/edema/erythema noted SKIN: warm, color normal PSYCH: no abnormalities of mood noted, alert and oriented to situation   ED Treatments / Results  Labs (all labs ordered are listed, but only abnormal results are displayed) Labs Reviewed  CBC WITH DIFFERENTIAL/PLATELET  COMPREHENSIVE METABOLIC PANEL    EKG  EKG Interpretation None       Radiology No results  found.  Procedures Procedures (including critical care time)  Medications Ordered in ED Medications  HYDROmorphone (DILAUDID) injection 0.5 mg (0.5 mg Intramuscular Given 07/08/17 2348)  ondansetron (ZOFRAN-ODT) disintegrating tablet 8 mg (8 mg Oral Given 07/08/17 2348)     Initial Impression / Assessment and Plan / ED Course  I have reviewed the triage vital signs and the nursing notes.  Pertinent labs   results that were available during my care of the patient were reviewed by me and considered in my medical decision making (see chart for details).     Pt stable No signs of acute CHF No signs of DVT Suspect edema is from sitting in a wheelchair since the weekend Will treat yeast infection Advised she can take lasix 20mg  BID   Final Clinical Impressions(s) / ED Diagnoses   Final diagnoses:  Peripheral edema  Yeast infection of the skin  Acute bilateral low back pain with bilateral sciatica    New Prescriptions New Prescriptions   NYSTATIN (MYCOSTATIN/NYSTOP) POWDER    Apply topically 3 (three) times daily.     Ripley Fraise, MD 07/09/17 [phone]

## 2017-07-08 NOTE — ED Triage Notes (Signed)
Pt states that she is being treated for L4-L5 fracture, has been having to use a wheelchair since Saturday, continues to have pain in right hip, bilateral feet swelling,  and rash with odor and drainage to left lower abd fold. Pt states that she was given 20 mg lasix today with no improvement in the swelling,

## 2017-07-09 DIAGNOSIS — R69 Illness, unspecified: Secondary | ICD-10-CM | POA: Diagnosis not present

## 2017-07-09 LAB — CBC WITH DIFFERENTIAL/PLATELET
BASOS PCT: 0 %
Basophils Absolute: 0 10*3/uL (ref 0.0–0.1)
EOS ABS: 0.2 10*3/uL (ref 0.0–0.7)
EOS PCT: 1 %
HEMATOCRIT: 42.6 % (ref 36.0–46.0)
HEMOGLOBIN: 13.9 g/dL (ref 12.0–15.0)
LYMPHS PCT: 10 %
Lymphs Abs: 1.5 10*3/uL (ref 0.7–4.0)
MCH: 29.6 pg (ref 26.0–34.0)
MCHC: 32.6 g/dL (ref 30.0–36.0)
MCV: 90.6 fL (ref 78.0–100.0)
MONOS PCT: 6 %
Monocytes Absolute: 0.9 10*3/uL (ref 0.1–1.0)
Neutro Abs: 12.7 10*3/uL — ABNORMAL HIGH (ref 1.7–7.7)
Neutrophils Relative %: 83 %
Platelets: 187 10*3/uL (ref 150–400)
RBC: 4.7 MIL/uL (ref 3.87–5.11)
RDW: 14.6 % (ref 11.5–15.5)
WBC: 15.3 10*3/uL — ABNORMAL HIGH (ref 4.0–10.5)

## 2017-07-09 LAB — COMPREHENSIVE METABOLIC PANEL
ALK PHOS: 81 U/L (ref 38–126)
ALT: 28 U/L (ref 14–54)
ANION GAP: 11 (ref 5–15)
AST: 27 U/L (ref 15–41)
Albumin: 3.3 g/dL — ABNORMAL LOW (ref 3.5–5.0)
BILIRUBIN TOTAL: 0.7 mg/dL (ref 0.3–1.2)
BUN: 17 mg/dL (ref 6–20)
CALCIUM: 8.7 mg/dL — AB (ref 8.9–10.3)
CO2: 27 mmol/L (ref 22–32)
CREATININE: 0.62 mg/dL (ref 0.44–1.00)
Chloride: 100 mmol/L — ABNORMAL LOW (ref 101–111)
Glucose, Bld: 94 mg/dL (ref 65–99)
Potassium: 3.4 mmol/L — ABNORMAL LOW (ref 3.5–5.1)
Sodium: 138 mmol/L (ref 135–145)
TOTAL PROTEIN: 6 g/dL — AB (ref 6.5–8.1)

## 2017-07-09 MED ORDER — NYSTATIN 100000 UNIT/GM EX POWD: Freq: Three times a day (TID) | CUTANEOUS | 0 refills | Status: DC

## 2017-07-09 NOTE — ED Notes (Signed)
Pt wheeled to waiting room. Pt verbalized understanding of discharge instructions.   

## 2017-07-11 ENCOUNTER — Encounter (HOSPITAL_COMMUNITY): Payer: Self-pay | Admitting: Emergency Medicine

## 2017-07-11 ENCOUNTER — Other Ambulatory Visit (HOSPITAL_COMMUNITY): Payer: Self-pay | Admitting: Neurosurgery

## 2017-07-11 ENCOUNTER — Emergency Department (HOSPITAL_COMMUNITY)
Admission: EM | Admit: 2017-07-11 | Discharge: 2017-07-11 | Disposition: A | Discharge status: Home / Self Care | Payer: BLUE CROSS/BLUE SHIELD | Attending: Emergency Medicine | Admitting: Emergency Medicine

## 2017-07-11 DIAGNOSIS — K5900 Constipation, unspecified: Secondary | ICD-10-CM | POA: Diagnosis not present

## 2017-07-11 DIAGNOSIS — I10 Essential (primary) hypertension: Secondary | ICD-10-CM | POA: Diagnosis not present

## 2017-07-11 DIAGNOSIS — E876 Hypokalemia: Secondary | ICD-10-CM | POA: Diagnosis not present

## 2017-07-11 DIAGNOSIS — D72829 Elevated white blood cell count, unspecified: Secondary | ICD-10-CM | POA: Diagnosis not present

## 2017-07-11 DIAGNOSIS — R110 Nausea: Secondary | ICD-10-CM | POA: Diagnosis not present

## 2017-07-11 DIAGNOSIS — R1084 Generalized abdominal pain: Secondary | ICD-10-CM | POA: Diagnosis not present

## 2017-07-11 DIAGNOSIS — Z79899 Other long term (current) drug therapy: Secondary | ICD-10-CM | POA: Diagnosis not present

## 2017-07-11 DIAGNOSIS — K5903 Drug induced constipation: Secondary | ICD-10-CM | POA: Insufficient documentation

## 2017-07-11 DIAGNOSIS — S32040A Wedge compression fracture of fourth lumbar vertebra, initial encounter for closed fracture: Secondary | ICD-10-CM

## 2017-07-11 DIAGNOSIS — M546 Pain in thoracic spine: Secondary | ICD-10-CM | POA: Diagnosis not present

## 2017-07-11 LAB — COMPREHENSIVE METABOLIC PANEL
ALT: 31 U/L (ref 14–54)
AST: 31 U/L (ref 15–41)
Albumin: 3.8 g/dL (ref 3.5–5.0)
Alkaline Phosphatase: 111 U/L (ref 38–126)
Anion gap: 12 (ref 5–15)
BILIRUBIN TOTAL: 0.7 mg/dL (ref 0.3–1.2)
BUN: 16 mg/dL (ref 6–20)
CO2: 32 mmol/L (ref 22–32)
CREATININE: 0.72 mg/dL (ref 0.44–1.00)
Calcium: 9 mg/dL (ref 8.9–10.3)
Chloride: 97 mmol/L — ABNORMAL LOW (ref 101–111)
GFR calc Af Amer: >60 mL/min (ref >60)
GLUCOSE: 103 mg/dL — AB (ref 65–99)
Potassium: 2.8 mmol/L — ABNORMAL LOW (ref 3.5–5.1)
Sodium: 141 mmol/L (ref 135–145)
TOTAL PROTEIN: 7 g/dL (ref 6.5–8.1)

## 2017-07-11 LAB — CBC WITH DIFFERENTIAL/PLATELET
BASOS ABS: 0 10*3/uL (ref 0.0–0.1)
Basophils Relative: 0 %
EOS PCT: 0 %
Eosinophils Absolute: 0 10*3/uL (ref 0.0–0.7)
HCT: 48.2 % — ABNORMAL HIGH (ref 36.0–46.0)
Hemoglobin: 15.7 g/dL — ABNORMAL HIGH (ref 12.0–15.0)
LYMPHS PCT: 5 %
Lymphs Abs: 1 10*3/uL (ref 0.7–4.0)
MCH: 29.7 pg (ref 26.0–34.0)
MCHC: 32.6 g/dL (ref 30.0–36.0)
MCV: 91.3 fL (ref 78.0–100.0)
MONO ABS: 2.1 10*3/uL — AB (ref 0.1–1.0)
MONOS PCT: 11 %
Neutro Abs: 15.8 10*3/uL — ABNORMAL HIGH (ref 1.7–7.7)
Neutrophils Relative %: 84 %
PLATELETS: 202 10*3/uL (ref 150–400)
RBC: 5.28 MIL/uL — ABNORMAL HIGH (ref 3.87–5.11)
RDW: 14.6 % (ref 11.5–15.5)
WBC: 18.9 10*3/uL — ABNORMAL HIGH (ref 4.0–10.5)

## 2017-07-11 LAB — LIPASE, BLOOD: LIPASE: 44 U/L (ref 11–51)

## 2017-07-11 MED ORDER — ONDANSETRON HCL 4 MG/2ML IJ SOLN
4.0000 mg | Freq: Once | INTRAMUSCULAR | Status: AC
  Administered 2017-07-11: 4 mg via INTRAVENOUS
  Filled 2017-07-11: qty 2

## 2017-07-11 MED ORDER — SODIUM CHLORIDE 0.9 % IV SOLN
INTRAVENOUS | Status: DC
  Administered 2017-07-11: 18:00:00 via INTRAVENOUS

## 2017-07-11 MED ORDER — POTASSIUM CHLORIDE CRYS ER 20 MEQ PO TBCR
40.0000 meq | EXTENDED_RELEASE_TABLET | Freq: Once | ORAL | Status: AC
  Administered 2017-07-11: 40 meq via ORAL
  Filled 2017-07-11: qty 2

## 2017-07-11 MED ORDER — MAGNESIUM CITRATE PO SOLN
1.0000 | Freq: Once | ORAL | Status: AC
  Administered 2017-07-11: 1 via ORAL
  Filled 2017-07-11: qty 296

## 2017-07-11 MED ORDER — SENNOSIDES-DOCUSATE SODIUM 8.6-50 MG PO TABS: 2.0000 | ORAL_TABLET | Freq: Every day | ORAL | 0 refills | Status: AC

## 2017-07-11 MED ORDER — FENTANYL 25 MCG/HR TD PT72
25.0000 ug | MEDICATED_PATCH | TRANSDERMAL | Status: DC
  Administered 2017-07-11: 25 ug via TRANSDERMAL
  Filled 2017-07-11: qty 1

## 2017-07-11 NOTE — ED Triage Notes (Signed)
Patient brought in by EMS with complaint of constipation from taking pain medication. Patient's bilateral feet purple in color at triage.

## 2017-07-11 NOTE — ED Provider Notes (Signed)
[facility] EMERGENCY DEPARTMENT Provider Note   CSN: [id number] Arrival date & time: 07/11/17  1651     History   Chief Complaint Chief Complaint  Patient presents with  . Constipation    HPI Betty Bryan is a 61 y.o. female.  HPI  Patient presents with concern of constipation, bilateral lower extremity swelling. 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 Patient recently started narcotic analgesia. She notes that over the past 2 or 3 days she has had tenesmus, with sense that she has a large stool burden, but aside from the small bits of stool production, no substantial bowel movements. No nausea, no vomiting, no abdominal pain. She does have history of IBS.  1 patient notes that concurrently she is having bilateral lower extremity swelling, less today than yesterday, but persistent, new since about 1 week ago. No new dyspnea, no new chest pain, no new syncope. After minimal relief with oral intake of magnesium citrate earlier today she presents for evaluation.  Past Medical History:  Diagnosis Date  . Adrenal insufficiency ([place])   . HTN (hypertension)   . L4 vertebral fracture (HCC)    L4-L5 fracture  . Palpitations   . PVC's (premature ventricular contractions)   . Ulcerative colitis     Patient Active Problem List   Diagnosis Date Noted  . Varicose veins of leg with complications 41/66/0630  . Chronic venous insufficiency 06/23/2015    Past Surgical History:  Procedure Laterality Date  . HAND SURGERY    . NASAL SINUS SURGERY      OB History    No data available       Home Medications    Prior to Admission medications   Medication Sig Start Date End Date Taking? Authorizing Provider  acetaminophen (TYLENOL 8 HOUR) 650 MG CR tablet Take 1 tablet (650 mg total) by mouth every 8 (eight) hours as needed for pain. 07/01/17   Varney Biles, MD    atenolol (TENORMIN) 50 MG tablet Take 12.5 mg by mouth 2 (two) times daily.     [provider]  balsalazide (COLAZAL) 750 MG capsule Take 2,250 mg by mouth 3 (three) times daily.    [provider]  calcium carbonate (OSCAL) 1500 (600 CA) MG TABS tablet Take 600 mg of elemental calcium by mouth 2 (two) times daily with a meal.    [provider]  Cyanocobalamin (B-12 PO) Take 1 tablet by mouth daily.    [provider]  Denosumab (PROLIA [place]) Inject into the skin every 6 (six) months.    [provider]  diazepam (VALIUM) 5 MG tablet Take 1.25 mg by mouth 2 (two) times daily.     [provider]  fluticasone (FLONASE) 50 MCG/ACT nasal spray Place 1 spray into both nostrils daily.     [provider]  furosemide (LASIX) 20 MG tablet Take 20 mg by mouth daily.    [provider]  gabapentin (NEURONTIN) 100 MG capsule Take 100 mg by mouth 3 (three) times daily.    [provider]  HYDROcodone-acetaminophen (NORCO/VICODIN) 5-325 MG tablet Take 1-2 tablets by mouth every 6 (six) hours as needed for moderate pain.    [provider]  HYDROCORTISONE ACE, RECTAL, 30 MG SUPP Place 1 suppository rectally at bedtime.  06/08/15   [provider]  lidocaine (LIDODERM) 5 % Place 1 patch onto the skin daily. Remove & Discard  patch within 12 hours or as directed by MD Patient not taking: Reported on 07/08/2017 07/01/17   Varney Biles, MD  loratadine (CLARITIN) 10 MG tablet Take 10 mg by mouth daily.    [provider]  nystatin (MYCOSTATIN/NYSTOP) powder Apply topically 3 (three) times daily. 07/09/17   Ripley Fraise, MD  potassium chloride (K-DUR) 10 MEQ tablet Take 10 mEq by mouth daily.    [provider]  predniSONE (DELTASONE) 1 MG tablet Take 3 mg by mouth daily with breakfast.    [provider]  predniSONE (DELTASONE) 5 MG tablet Take 5 mg by mouth daily with breakfast.     [provider]  traMADol (ULTRAM) 50 MG tablet Take 1 tablet (50 mg total) by mouth every 8 (eight) hours as needed. Patient not taking: Reported on 07/08/2017 04/28/17   Edrick Kins, DPM    Family History Family History  Problem Relation Age of Onset  . Heart failure Unknown   . Diabetes Father   . Heart disease Father   . Hypertension Father     Social History Social History  Substance Use Topics  . Smoking status: Never Smoker  . Smokeless tobacco: Never Used  . Alcohol use No     Allergies   Penicillins; Sulfa antibiotics; Morphine and related; and Voltaren [diclofenac]   Review of Systems Review of Systems  Constitutional:       Per HPI, otherwise negative  HENT:       Per HPI, otherwise negative  Respiratory:       Per HPI, otherwise negative  Cardiovascular:       Per HPI, otherwise negative  Gastrointestinal: Negative for abdominal distention, abdominal pain, diarrhea and vomiting.  Endocrine:       Negative aside from HPI  Genitourinary:       Neg aside from HPI   Musculoskeletal:       Per HPI, otherwise negative  Skin: Positive for color change.  Neurological: Negative for syncope.     Physical Exam Updated Vital Signs BP 128/86 (BP Location: Right Arm)   Pulse (!) 118   Temp 97.7 F (36.5 C) (Oral)   Resp 20   Ht 5\' 2"  (1.575 m)   Wt 63.5 kg (140 lb)   SpO2 98%   BMI 25.61 kg/m   Physical Exam  Constitutional: She is oriented to person, place, and time. She has a sickly appearance. No distress.  HENT:  Head: Normocephalic and atraumatic.  Eyes: Conjunctivae and EOM are normal.  Cardiovascular: Normal rate and regular rhythm.   Pulmonary/Chest: Effort normal and breath sounds normal. No stridor. No respiratory distress.  Abdominal: She exhibits no distension. There is no tenderness.  Musculoskeletal: She exhibits no edema.  Neurological: She is alert and oriented to person, place, and time. No cranial nerve deficit.  Skin:  Skin is warm and dry.     Psychiatric: She has a normal mood and affect.  Nursing note and vitals reviewed.    ED Treatments / Results  Labs (all labs ordered are listed, but only abnormal results are displayed) Labs Reviewed  COMPREHENSIVE METABOLIC PANEL - Abnormal; Notable for the following:       Result Value   Potassium 2.8 (*)    Chloride 97 (*)    Glucose, Bld 103 (*)    All other components within normal limits  CBC WITH DIFFERENTIAL/PLATELET - Abnormal; Notable for the following:    WBC 18.9 (*)    RBC 5.28 (*)  Hemoglobin 15.7 (*)    HCT 48.2 (*)    Neutro Abs 15.8 (*)    Monocytes Absolute 2.1 (*)    All other components within normal limits  LIPASE, BLOOD     Procedures Procedures (including critical care time)  Medications Ordered in ED Medications  magnesium citrate solution 1 Bottle (not administered)  0.9 %  sodium chloride infusion (not administered)     Initial Impression / Assessment and Plan / ED Course  I have reviewed the triage vital signs and the nursing notes.  Pertinent labs & imaging results that were available during my care of the patient were reviewed by me and considered in my medical decision making (see chart for details).  Chart review performed, notable for evaluation here 3 days ago.  9:57 PM Patient appears better, states that she feels better. She has had small bowel movement. Nausea has resolved. We had a lengthy conversation with 2 family members present about her ongoing concerns With reassuring findings today, no dyspnea, no tachycardia or after symptoms were controlled, there is low suspicion for PE, with prior studies acknowledged as well, no evidence for acute kidney, acute hepatic failure, no evidence for congestive heart failure, suspicion for dependent edema given the patient's substantially decreased activity since the fall, largely bedbound status. Patient does have mild hypokalemia, received repletion  here. Leukocytosis likely secondary to ongoing steroid use absent fever, ongoing URI-like illness, no substantial abdominal pain, no obvious cutaneous findings, but less evidence for infection. With substantial improvement, subjectively and objectively, the patient was discharged after initiation of a new stool softener regimen, with a new type of analgesia after a lengthy conversation about what may or may not be reasonable to expect for pain control. The patient has follow-up capacity with primary care and GI in 3 days.  Final Clinical Impressions(s) / ED Diagnoses   Final diagnoses:  Drug-induced constipation  Nausea  Generalized abdominal pain  Hypokalemia    New Prescriptions New Prescriptions   SENNA-DOCUSATE (SENOKOT-S) 8.6-50 MG TABLET    Take 2 tablets by mouth daily.     Carmin Muskrat, MD 07/11/17 2200

## 2017-07-11 NOTE — Discharge Instructions (Signed)
As discussed, with your ongoing issues it is very important that he follow-up with both your primary care physician and your gastroenterologist. Please take all medication as directed, including the newly prescribed stool softening tablets. In addition to these he may elect to use enema for additional constipation relief.  Return here for concerning changes in her condition.

## 2017-07-11 NOTE — ED Notes (Signed)
Assisted pt to bathroom, pt unable to have a BM, rectum is closed, pt complaining of rectal pain, pt assisted in bed, EDP at the bedside

## 2017-07-12 DIAGNOSIS — R69 Illness, unspecified: Secondary | ICD-10-CM | POA: Diagnosis not present

## 2017-07-16 DIAGNOSIS — N390 Urinary tract infection, site not specified: Secondary | ICD-10-CM | POA: Diagnosis not present

## 2017-07-16 DIAGNOSIS — R1030 Lower abdominal pain, unspecified: Secondary | ICD-10-CM | POA: Diagnosis not present

## 2017-07-16 DIAGNOSIS — Z0000 Encounter for general adult medical examination without abnormal findings: Secondary | ICD-10-CM | POA: Diagnosis not present

## 2017-07-16 DIAGNOSIS — Z6828 Body mass index (BMI) 28.0-28.9, adult: Secondary | ICD-10-CM | POA: Diagnosis not present

## 2017-07-16 DIAGNOSIS — K5190 Ulcerative colitis, unspecified, without complications: Secondary | ICD-10-CM | POA: Diagnosis not present

## 2017-07-16 DIAGNOSIS — R102 Pelvic and perineal pain: Secondary | ICD-10-CM | POA: Diagnosis not present

## 2017-07-16 DIAGNOSIS — Z1389 Encounter for screening for other disorder: Secondary | ICD-10-CM | POA: Diagnosis not present

## 2017-07-16 DIAGNOSIS — R808 Other proteinuria: Secondary | ICD-10-CM | POA: Diagnosis not present

## 2017-07-16 DIAGNOSIS — M545 Low back pain: Secondary | ICD-10-CM | POA: Diagnosis not present

## 2017-07-16 DIAGNOSIS — I82431 Acute embolism and thrombosis of right popliteal vein: Secondary | ICD-10-CM | POA: Diagnosis not present

## 2017-07-17 ENCOUNTER — Ambulatory Visit (HOSPITAL_COMMUNITY)
Admission: RE | Admit: 2017-07-17 | Discharge: 2017-07-17 | Disposition: A | Discharge status: Home / Self Care | Payer: BLUE CROSS/BLUE SHIELD | Source: Ambulatory Visit | Attending: Neurosurgery | Admitting: Neurosurgery

## 2017-07-17 DIAGNOSIS — M545 Low back pain: Secondary | ICD-10-CM | POA: Diagnosis not present

## 2017-07-17 DIAGNOSIS — S32040A Wedge compression fracture of fourth lumbar vertebra, initial encounter for closed fracture: Secondary | ICD-10-CM

## 2017-07-17 DIAGNOSIS — M8580 Other specified disorders of bone density and structure, unspecified site: Secondary | ICD-10-CM | POA: Diagnosis not present

## 2017-07-17 DIAGNOSIS — M4856XA Collapsed vertebra, not elsewhere classified, lumbar region, initial encounter for fracture: Secondary | ICD-10-CM | POA: Diagnosis not present

## 2017-07-17 DIAGNOSIS — M48061 Spinal stenosis, lumbar region without neurogenic claudication: Secondary | ICD-10-CM | POA: Diagnosis not present

## 2017-07-23 DIAGNOSIS — R609 Edema, unspecified: Secondary | ICD-10-CM | POA: Diagnosis not present

## 2017-07-23 DIAGNOSIS — I87319 Chronic venous hypertension (idiopathic) with ulcer of unspecified lower extremity: Secondary | ICD-10-CM | POA: Diagnosis not present

## 2017-07-23 DIAGNOSIS — M5416 Radiculopathy, lumbar region: Secondary | ICD-10-CM | POA: Diagnosis not present

## 2017-07-23 DIAGNOSIS — K625 Hemorrhage of anus and rectum: Secondary | ICD-10-CM | POA: Diagnosis not present

## 2017-07-25 DIAGNOSIS — K625 Hemorrhage of anus and rectum: Secondary | ICD-10-CM | POA: Diagnosis not present

## 2017-07-25 DIAGNOSIS — K602 Anal fissure, unspecified: Secondary | ICD-10-CM | POA: Diagnosis not present

## 2017-08-06 DIAGNOSIS — M810 Age-related osteoporosis without current pathological fracture: Secondary | ICD-10-CM | POA: Diagnosis not present

## 2017-08-06 DIAGNOSIS — I87319 Chronic venous hypertension (idiopathic) with ulcer of unspecified lower extremity: Secondary | ICD-10-CM | POA: Diagnosis not present

## 2017-08-06 DIAGNOSIS — M5416 Radiculopathy, lumbar region: Secondary | ICD-10-CM | POA: Diagnosis not present

## 2017-08-06 DIAGNOSIS — I82409 Acute embolism and thrombosis of unspecified deep veins of unspecified lower extremity: Secondary | ICD-10-CM | POA: Diagnosis not present

## 2017-08-07 ENCOUNTER — Ambulatory Visit (INDEPENDENT_AMBULATORY_CARE_PROVIDER_SITE_OTHER): Payer: BLUE CROSS/BLUE SHIELD | Admitting: Family

## 2017-08-07 ENCOUNTER — Encounter (INDEPENDENT_AMBULATORY_CARE_PROVIDER_SITE_OTHER): Payer: Self-pay | Admitting: Family

## 2017-08-07 VITALS — Ht 62.0 in | Wt 140.0 lb

## 2017-08-07 DIAGNOSIS — L97921 Non-pressure chronic ulcer of unspecified part of left lower leg limited to breakdown of skin: Secondary | ICD-10-CM | POA: Diagnosis not present

## 2017-08-07 DIAGNOSIS — I872 Venous insufficiency (chronic) (peripheral): Secondary | ICD-10-CM

## 2017-08-07 NOTE — Progress Notes (Signed)
Office Visit Note   Patient: Betty Bryan           Date of Birth: [birth year]           MRN: [id number] Visit Date: 08/07/2017              Requested by: Crist Infante, MD [address] [place], [place] [address] PCP: Crist Infante, MD  Chief Complaint  Patient presents with  . Left Leg - Open Wound    3 weeks s/p injury ulcer to shin      HPI: The patient is a 61 year old woman who presents today for initial evaluation of a traumatic ulceration to the left shin. States about 3 weeks ago a crutch fell onto her shin the ulcer has been getting bigger and deeper. She has been seen by her primary care doctor Perini who has placed her on doxycycline course. Does have some surrounding redness and tenderness. Has tried wearing a vive compression stocking states this is too painful to don.  Assessment & Plan: Visit Diagnoses:  1. Traumatic ulcer of left lower leg, limited to breakdown of skin ([place])   2. Chronic venous insufficiency     Plan: Follow-up in office next Tuesday will remove dressing and reevaluate. Complete course of doxycycline.  Follow-Up Instructions: Return in about 1 week (around 08/14/2017).   Ortho Exam  Patient is alert, oriented, no adenopathy, well-dressed, normal affect, normal respiratory effort. On examination of the left lower extremity she has trace edema there is an ulceration over the mid anterior shin this is 15 millimeters in diameter and 3 mm deep there is 100% exudative tissue in the wound bed. There is no exposed tendon or bone. Some surrounding erythema, resolving cellulitis. Tenderness.  Imaging: No results found. No images are attached to the encounter.  Labs: No results found for: HGBA1C, ESRSEDRATE, CRP, LABURIC, REPTSTATUS, GRAMSTAIN, CULT, LABORGA  Orders:  No orders of the defined types were placed in this encounter.  No orders of the defined types were placed in this encounter.    Procedures: No procedures performed  Clinical  Data: No additional findings.  ROS:  All other systems negative, except as noted in the HPI. Review of Systems  Constitutional: Negative for chills and fever.  Cardiovascular: Positive for leg swelling.  Skin: Positive for color change and wound.    Objective: Vital Signs: Ht 5\' 2"  (1.575 m)   Wt 140 lb (63.5 kg)   BMI 25.61 kg/m   Specialty Comments:  No specialty comments available.  PMFS History: Patient Active Problem List   Diagnosis Date Noted  . Varicose veins of leg with complications 33/29/5188  . Chronic venous insufficiency 06/23/2015   Past Medical History:  Diagnosis Date  . Adrenal insufficiency ([place])   . HTN (hypertension)   . L4 vertebral fracture ([facility])    L4-L5 fracture  . Palpitations   . PVC's (premature ventricular contractions)   . Ulcerative colitis     Family History  Problem Relation Age of Onset  . Heart failure Unknown   . Diabetes Father   . Heart disease Father   . Hypertension Father     Past Surgical History:  Procedure Laterality Date  . HAND SURGERY    . NASAL SINUS SURGERY     Social History   Occupational History  . Occupation: Biochemist, clinical  Tobacco Use  . Smoking status: Never Smoker  . Smokeless tobacco: Never Used  Substance and Sexual Activity  . Alcohol use: No  Alcohol/week: 0.0 oz  . Drug use: No  . Sexual activity: Not on file

## 2017-08-11 ENCOUNTER — Encounter (INDEPENDENT_AMBULATORY_CARE_PROVIDER_SITE_OTHER): Payer: Self-pay | Admitting: Family

## 2017-08-11 ENCOUNTER — Ambulatory Visit (INDEPENDENT_AMBULATORY_CARE_PROVIDER_SITE_OTHER): Payer: BLUE CROSS/BLUE SHIELD | Admitting: Orthopedic Surgery

## 2017-08-11 DIAGNOSIS — L97921 Non-pressure chronic ulcer of unspecified part of left lower leg limited to breakdown of skin: Secondary | ICD-10-CM

## 2017-08-11 DIAGNOSIS — I872 Venous insufficiency (chronic) (peripheral): Secondary | ICD-10-CM

## 2017-08-11 NOTE — Progress Notes (Addendum)
Office Visit Note   Patient: Betty Bryan           Date of Birth: 1956/09/20           MRN: [id number] Visit Date: 08/11/2017              Requested by: Crist Infante, MD [address] [place], [place] [address] PCP: Crist Infante, MD  Chief Complaint  Patient presents with  . Left Leg - Pain      HPI: Patient presents with a traumatic venous stasis ulcer left leg she is status post previous traumatic venous stasis ulcer of the right leg.  She is currently wearing a silver alginate dressing placed on Keflex.  Patient is completed a 7-day course of doxycycline.  Patient has a history of borderline diabetes as well was on chronic prednisone for her ulcerative colitis.  Assessment & Plan: Visit Diagnoses:  1. Chronic venous insufficiency   2. Traumatic ulcer of left lower leg, limited to breakdown of skin ([place])     Plan: We will have the patient get a  Small medical compression socks she will wear these around the clock change the sock daily wash the leg with soap and water and apply a new sock wash the old side.  Follow-Up Instructions: Return in about 2 weeks (around 08/25/2017).   Ortho Exam  Patient is alert, oriented, no adenopathy, well-dressed, normal affect, normal respiratory effort. Examination patient has venous stasis insufficiency changes of both legs.  The right leg ulcers have healed.  The left leg she has dramatic venous stasis ulcer.  Photographs were obtained and the wound is approximately 2 cm in diameter and 2 mm deep.  There is fibrinous tissue over 75% of the wound the edges have good petechial bleeding.  There is no cellulitis no tenderness around the wound no fluctuance no signs of infection. Calf measures 28.5 cm in circumference  Imaging: No results found. No images are attached to the encounter.  Labs: No results found for: HGBA1C, ESRSEDRATE, CRP, LABURIC, REPTSTATUS, GRAMSTAIN, CULT, LABORGA  Orders:  No orders of the defined types were placed  in this encounter.  No orders of the defined types were placed in this encounter.    Procedures: No procedures performed  Clinical Data: No additional findings.  ROS:  All other systems negative, except as noted in the HPI. Review of Systems   No images are attached to the encounter.  No images are attached to the encounter. Objective: Vital Signs: There were no vitals taken for this visit.  Specialty Comments:  No specialty comments available.  PMFS History: Patient Active Problem List   Diagnosis Date Noted  . Varicose veins of leg with complications 85/63/1497  . Chronic venous insufficiency 06/23/2015   Past Medical History:  Diagnosis Date  . Adrenal insufficiency ([place])   . HTN (hypertension)   . L4 vertebral fracture (HCC)    L4-L5 fracture  . Palpitations   . PVC's (premature ventricular contractions)   . Ulcerative colitis     Family History  Problem Relation Age of Onset  . Heart failure Unknown   . Diabetes Father   . Heart disease Father   . Hypertension Father     Past Surgical History:  Procedure Laterality Date  . HAND SURGERY    . NASAL SINUS SURGERY     Social History   Occupational History  . Occupation: Biochemist, clinical  Tobacco Use  . Smoking status: Never Smoker  . Smokeless tobacco:  Never Used  Substance and Sexual Activity  . Alcohol use: No    Alcohol/week: 0.0 oz  . Drug use: No  . Sexual activity: Not on file

## 2017-08-25 ENCOUNTER — Ambulatory Visit (INDEPENDENT_AMBULATORY_CARE_PROVIDER_SITE_OTHER): Payer: BLUE CROSS/BLUE SHIELD | Admitting: Orthopedic Surgery

## 2017-08-25 ENCOUNTER — Encounter (INDEPENDENT_AMBULATORY_CARE_PROVIDER_SITE_OTHER): Payer: Self-pay | Admitting: Family

## 2017-08-25 VITALS — Ht 62.0 in | Wt 140.0 lb

## 2017-08-25 DIAGNOSIS — L97921 Non-pressure chronic ulcer of unspecified part of left lower leg limited to breakdown of skin: Secondary | ICD-10-CM

## 2017-08-25 DIAGNOSIS — I872 Venous insufficiency (chronic) (peripheral): Secondary | ICD-10-CM | POA: Diagnosis not present

## 2017-08-25 NOTE — Progress Notes (Signed)
Office Visit Note   Patient: Betty Bryan           Date of Birth: 1955/09/27           MRN: [id number] Visit Date: 08/25/2017              Requested by: Crist Infante, MD [address] [place],  [address] PCP: Crist Infante, MD  Chief Complaint  Patient presents with  . Left Foot - Follow-up    VSI with ulceration       HPI: Patient is a 61 year old woman with venous insufficiency she is status post traumatic wound to the left lower extremity she states that the small compression stocking was too tight and painful.  She states she had no pain with the medium stocking.  She is currently using antibiotic ointment and a Band-Aid.  Assessment & Plan: Visit Diagnoses:  1. Traumatic ulcer of left lower leg, limited to breakdown of skin ([place])   2. Chronic venous insufficiency     Plan: Recommended that she start with the small compression stocking and use this first thing in the morning as long as she can comfortably and then transition to the medium stocking hopefully we will be able to increase the amount of time she wears the small stockings to do this daily follow-up in the office in 4 weeks or sooner change.  Follow-Up Instructions: Return in about 4 weeks (around 09/22/2017).   Ortho Exam  Patient is alert, oriented, no adenopathy, well-dressed, normal affect, normal respiratory effort. Examination patient does have varicose veins in the left lower extremity she has no tenderness to palpation the ulcer is 7 x 11 mm and 2 mm deep.  There is fibrinous tissue at the base the wound edges are healthy there is no cellulitis no odor no drainage no exposed bone or tendon no signs of infection.  Imaging: No results found. No images are attached to the encounter.  Labs: No results found for: HGBA1C, ESRSEDRATE, CRP, LABURIC, REPTSTATUS, GRAMSTAIN, CULT, LABORGA  @LABSALLVALUES (HGBA1)@  Body mass index is 25.61 kg/m.  Orders:  No orders of the defined types were placed  in this encounter.  No orders of the defined types were placed in this encounter.    Procedures: No procedures performed  Clinical Data: No additional findings.  ROS:  All other systems negative, except as noted in the HPI. Review of Systems  Objective: Vital Signs: Ht 5\' 2"  (1.575 m)   Wt 140 lb (63.5 kg)   BMI 25.61 kg/m   Specialty Comments:  No specialty comments available.  PMFS History: Patient Active Problem List   Diagnosis Date Noted  . Varicose veins of leg with complications 09/32/6712  . Chronic venous insufficiency 06/23/2015   Past Medical History:  Diagnosis Date  . Adrenal insufficiency ([place])   . HTN (hypertension)   . L4 vertebral fracture ([facility])    L4-L5 fracture  . Palpitations   . PVC's (premature ventricular contractions)   . Ulcerative colitis     Family History  Problem Relation Age of Onset  . Heart failure Unknown   . Diabetes Father   . Heart disease Father   . Hypertension Father     Past Surgical History:  Procedure Laterality Date  . HAND SURGERY    . NASAL SINUS SURGERY     Social History   Occupational History  . Occupation: Biochemist, clinical  Tobacco Use  . Smoking status: Never Smoker  . Smokeless tobacco: Never Used  Substance and Sexual Activity  . Alcohol use: No    Alcohol/week: 0.0 oz  . Drug use: No  . Sexual activity: Not on file

## 2017-08-26 ENCOUNTER — Ambulatory Visit (INDEPENDENT_AMBULATORY_CARE_PROVIDER_SITE_OTHER): Payer: BLUE CROSS/BLUE SHIELD | Admitting: Orthopedic Surgery

## 2017-09-08 DIAGNOSIS — M810 Age-related osteoporosis without current pathological fracture: Secondary | ICD-10-CM | POA: Diagnosis not present

## 2017-09-08 DIAGNOSIS — M5416 Radiculopathy, lumbar region: Secondary | ICD-10-CM | POA: Diagnosis not present

## 2017-09-08 DIAGNOSIS — I82409 Acute embolism and thrombosis of unspecified deep veins of unspecified lower extremity: Secondary | ICD-10-CM | POA: Diagnosis not present

## 2017-09-08 DIAGNOSIS — I87319 Chronic venous hypertension (idiopathic) with ulcer of unspecified lower extremity: Secondary | ICD-10-CM | POA: Diagnosis not present

## 2017-10-17 DIAGNOSIS — M5416 Radiculopathy, lumbar region: Secondary | ICD-10-CM | POA: Diagnosis not present

## 2017-10-17 DIAGNOSIS — E119 Type 2 diabetes mellitus without complications: Secondary | ICD-10-CM | POA: Diagnosis not present

## 2017-10-17 DIAGNOSIS — M810 Age-related osteoporosis without current pathological fracture: Secondary | ICD-10-CM | POA: Diagnosis not present

## 2017-10-17 DIAGNOSIS — I82409 Acute embolism and thrombosis of unspecified deep veins of unspecified lower extremity: Secondary | ICD-10-CM | POA: Diagnosis not present

## 2017-10-23 DIAGNOSIS — L603 Nail dystrophy: Secondary | ICD-10-CM | POA: Diagnosis not present

## 2017-10-23 DIAGNOSIS — L821 Other seborrheic keratosis: Secondary | ICD-10-CM | POA: Diagnosis not present

## 2017-11-07 ENCOUNTER — Telehealth (HOSPITAL_COMMUNITY): Payer: Self-pay

## 2017-11-07 NOTE — Telephone Encounter (Signed)
Called pt back - she wanted to r/s - I l/m requested return phone call. NF 11/07/17

## 2017-11-12 ENCOUNTER — Ambulatory Visit (HOSPITAL_COMMUNITY): Payer: BLUE CROSS/BLUE SHIELD

## 2017-11-12 ENCOUNTER — Encounter (HOSPITAL_COMMUNITY): Payer: Self-pay

## 2017-11-20 ENCOUNTER — Ambulatory Visit (HOSPITAL_COMMUNITY): Payer: BLUE CROSS/BLUE SHIELD | Attending: Internal Medicine

## 2017-11-20 DIAGNOSIS — M6281 Muscle weakness (generalized): Secondary | ICD-10-CM | POA: Diagnosis not present

## 2017-11-20 DIAGNOSIS — R262 Difficulty in walking, not elsewhere classified: Secondary | ICD-10-CM | POA: Diagnosis not present

## 2017-11-20 DIAGNOSIS — R293 Abnormal posture: Secondary | ICD-10-CM

## 2017-11-20 NOTE — Patient Instructions (Signed)
  BRIDGING  While lying on your back with knees bent, tighten your lower abdominals, squeeze your buttocks and then raise your buttocks off the floor/bed as creating a "Bridge" with your body. Hold and then lower yourself and repeat.  Perform 1x/day, 2-3 sets of 10-15 reps   Seated Piriformis Stretch - Figure 4 stretch  While seated, cross one leg over the other and hold.  For an increased stretch lean forward and hold.  Repeat on the other leg.  Perform 1-2x/day, 3-5 stretches holding for 30-60 seconds   SIT TO STAND - NO SUPPORT  Start by scooting close to the front of the chair.  Next, lean forward at your trunk and reach forward with your arms and rise to standing without using your hands to push off from the chair or other object.   Use your arms as a counter-balance by reaching forward when in sitting and lower them as you approach standing.   Perform 1x/day, 2-3 sets of 10-15 reps

## 2017-11-20 NOTE — Therapy (Signed)
[place], [place], [address] Phone: [phone]   Fax:  [phone]  Physical Therapy Evaluation  Patient Details  Name: Betty Bryan MRN: [id number] Date of Birth: Dec 16, 1955 Referring Provider: Jerlyn Ly, MD   Encounter Date: 11/20/2017  PT End of Session - 11/20/17 1624    Visit Number  1    Number of Visits  13    Date for PT Re-Evaluation  12/11/17    Authorization Type  BCBS Other    Authorization Time Period  11/20/17 to 01/01/18    PT Start Time  1435    PT Stop Time  1515    PT Time Calculation (min)  40 min    Activity Tolerance  Patient tolerated treatment well    Behavior During Therapy  [facility] for tasks assessed/performed       Past Medical History:  Diagnosis Date  . Adrenal insufficiency ([place])   . HTN (hypertension)   . L4 vertebral fracture ([facility])    L4-L5 fracture  . Palpitations   . PVC's (premature ventricular contractions)   . Ulcerative colitis     Past Surgical History:  Procedure Laterality Date  . HAND SURGERY    . NASAL SINUS SURGERY      There were no vitals filed for this visit.   Subjective Assessment - 11/20/17 1445    Subjective  Pt states she fell last fall and has a L4-5 compression fracture. She states that she is just a little leary about performing a lot of physical activity. Her bones are weak due to long-term steroid use. She also had stress fractures in her feet last year as well. She states that she also has L sided sciatic pain; she thinks that's why Dr. Joylene Draft referred her. She has been having difficulty with her LLE pain for the last 20 years; but since last summer it has become worse. She does report feelings of numbness; she denies b/b changes. Currently, she feels like she is atrophied; she doesn't feel like she can walk very far. She feels like performing stairs are difficult for her to do. She wishes to be able to walk longer, stand longer and walk up  stairs better. Dr. Joylene Draft has her on a 5# lifting restriction. She carries her [facility] only for precaution.     Pertinent History  PVCs, HTN, adrenal insufficiency, chronic steroid use due to PMH conditions (including steriod myopathy per patient)    Limitations  Walking;Lifting    How long can you stand comfortably?  a few mins    How long can you walk comfortably?  <5 mins sustained    Patient Stated Goals  walk longer, stand longer, perform stairs better    Currently in Pain?  Yes    Pain Score  4     Pain Location  Back    Pain Orientation  Left    Pain Descriptors / Indicators  Aching;Dull    Pain Type  Chronic pain    Pain Radiating Towards  does go into LLE by the afternoon    Pain Onset  More than a month ago    Pain Frequency  Intermittent    Aggravating Factors   unsure    Pain Relieving Factors  unsure    Effect of Pain on Daily Activities  not doing much of anything         [facility] PT Assessment - 11/20/17 0001      Assessment  Medical Diagnosis  LBP    Referring Provider  Jerlyn Ly, MD    Onset Date/Surgical Date  -- years ago    Next MD Visit  May 2019    Prior Therapy  PT for plantar fascitis      Balance Screen   Has the patient fallen in the past 6 months  No    Has the patient had a decrease in activity level because of a fear of falling?   Yes    Is the patient reluctant to leave their home because of a fear of falling?   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    Leisure  shopping, antiquing      Observation/Other Assessments   Focus on Therapeutic Outcomes ([place])   to be completed next visit      Sensation   Light Touch  Appears Intact      Posture/Postural Control   Posture/Postural Control  Postural limitations    Postural Limitations  Rounded Shoulders;Forward head;Increased thoracic kyphosis;Increased lumbar lordosis       ROM / Strength   AROM / PROM / Strength  AROM;Strength      AROM   AROM Assessment Site  Lumbar    Lumbar Flexion  did not assess due to compression fractures    Lumbar Extension  WFL, REISx10 reps: loosened the tightness and pain "maybe a little"    Lumbar - Right Side Bend  WFL    Lumbar - Left Side Bend  WFL    Lumbar - Right Rotation  WFL    Lumbar - Left Rotation  WFL, mild tightness on L      Strength   Strength Assessment Site  Hip;Knee;Ankle    Right Hip Flexion  4+/5    Right Hip Extension  2+/5    Right Hip External Rotation   -- to be assessed next visit    Right Hip Internal Rotation  -- to be assessed next visit    Right Hip ABduction  4-/5    Left Hip Flexion  4+/5 min knee pain    Left Hip Extension  3-/5    Left Hip External Rotation  -- to be assessed next visit    Left Hip Internal Rotation  -- to be assessed next visit    Left Hip ABduction  4-/5    Right Knee Flexion  4+/5    Right Knee Extension  5/5    Left Knee Flexion  4/5    Left Knee Extension  4+/5    Right Ankle Dorsiflexion  4+/5    Left Ankle Dorsiflexion  4+/5      Flexibility   Soft Tissue Assessment /Muscle Length  yes    Hamstrings  WFL, L tighter than R    Piriformis  WFL, L tighter than R      Palpation   Spinal mobility  did not assess d/t osteopenia per MRI    Palpation comment  increased restrictions, palpable knots, and tenderness to palpation of bil glutes, piriformis, ITB, lumbar paraspinals, L>R throughout      Special Tests    Special Tests  Lumbar    Lumbar Tests  Straight Leg Raise      Straight Leg Raise   Findings  Negative    Comment  BLE  Ambulation/Gait   Ambulation Distance (Feet)  -- perform 6MWT next visit    Gait Comments  deficits in core, BLE, and functional strength based off gait assessment around clinic      Balance   Balance Assessed  Yes      Static Standing Balance   Static Standing - Balance Support  No upper extremity supported     Static Standing Balance -  Activities   Single Leg Stance - Right Leg;Single Leg Stance - Left Leg    Static Standing - Comment/# of Minutes  R: 3.1 or < L: 9.6 or <      Standardized Balance Assessment   Standardized Balance Assessment  Five Times Sit to Stand    Five times sit to stand comments   8.4 sec, no UE, chair         Objective measurements completed on examination: See above findings.      PT Education - 11/20/17 1623    Education provided  Yes    Education Details  exam findings, POC, HEP    Person(s) Educated  Patient    Methods  Explanation;Handout    Comprehension  Verbalized understanding       PT Short Term Goals - 11/20/17 1638      PT SHORT TERM GOAL #1   Title  Pt will be independent with HEP and perform consistently in order to improve strength and maximize QOL.    Time  3    Period  Weeks    Status  New    Target Date  12/11/17      PT SHORT TERM GOAL #2   Title  Pt will have 1/2 grade improvement in MMT of all muscle groups tested in order to maximize gait, balance, and decrease pain.    Time  3    Period  Weeks    Status  New      PT SHORT TERM GOAL #3   Title  Pt will be able to perform bil SLS for at least 10 sec with no UE and min to no unsteadiness in order to improve gait and maximize confidence with stair ambulation.    Time  3    Period  Weeks    Status  New        PT Long Term Goals - 11/20/17 1641      PT LONG TERM GOAL #1   Title  Pt will have 1 grade improvement in MMT of all muscle groups tested in order to further maximize gait, balance, and stair ambulation.    Time  6    Period  Weeks    Status  New    Target Date  01/01/18      PT LONG TERM GOAL #2   Title  Pt will be able to ambulate at least 1484ft or > during 6MWT to demo improved functional and BLE strength and to maximize pt's community ambulation to allow her to go grocery shopping with greater ease.    Time  6    Period  Weeks    Status  New      PT LONG  TERM GOAL #3   Title  Pt will report being able to stand for at least 15 mins to allow her to perform self-grooming and hosuehold chores with greater ease.    Time  6    Period  Weeks    Status  New      PT LONG TERM GOAL #4  Title  Pt will report compliance with regular walking program at least 3x/week for at least 15 mins or more each session to decrease pain, maximize functional and BLE strength, and allow pt to access her community with greater ease.    Time  6    Period  Weeks    Status  New             Plan - 11/20/17 1633    Clinical Impression Statement  Pt is pleasant 62YO F who presents to OPPT with c/o LBP with LLE pain. Pt stated she was having sciatic pain but verbalizes and pinpoints her pain as going down the side of her L leg; PT explained that sciatic pain would be down her posterior leg. Per her MRI, pt has osteopenia and pt reports sustaining L4-5 compression fracture s/p falling back in the fall. She currently presents with strength deficits in BLE, core, and functional strength as well as deficits in balance, gait, and posture. She also has increased soft tissue restrictions with tenderness to palpation of glutes, piriformis, ITB, and lumbar paraspinals, L>R throughout; pt stating that palpation to these regions recreated her same pain. She also demo'd a seated figure 4 stretch and reported that it helped decrease her pain, further indicating pathology of the gluteals and/or piriformis muscle. Pt needs skilled PT intervention to address these deficits in order to decrease pain, maximize strength, and improve overall QOL.    History and Personal Factors relevant to plan of care:  eager to improve but needs reassuring that PT is safe and effective for her condition; h/o: osteopenia, L4-5 compression fracture; HTN, adrenal insufficiency, PVC's, ulcerative colitis    Clinical Presentation  Stable    Clinical Presentation due to:  MMT, ROM, SLS, functional strength, posture,  osteopenia, compression fracture, soft tissue restrictions, posture    Clinical Decision Making  Low    Rehab Potential  Good    PT Frequency  2x / week    PT Duration  6 weeks    PT Treatment/Interventions  ADLs/Self Care Home Management;Cryotherapy;Electrical Stimulation;Moist Heat;DME Instruction;Gait training;Stair training;Functional mobility training;Therapeutic activities;Therapeutic exercise;Balance training;Neuromuscular re-education;Cognitive remediation;Patient/family education;Manual techniques;Dry needling;Energy conservation;Taping    PT Next Visit Plan  review goals and HEP; perform hip IR/ER MMT, 6MWT, assess stairs; L HS stretch, trial ITB stretch, and SKTC and DKTC for paraspinal stretch; initiate BLE, functional, core, and postural strengthening. manual for soft tissue restrictions    PT Home Exercise Plan  eval: bridging, seated piriformis stretch, sit to stand    Consulted and Agree with Plan of Care  Patient       Patient will benefit from skilled therapeutic intervention in order to improve the following deficits and impairments:  Decreased activity tolerance, Decreased balance, Decreased endurance, Decreased mobility, Decreased range of motion, Decreased strength, Difficulty walking, Increased fascial restricitons, Increased muscle spasms, Improper body mechanics, Postural dysfunction, Pain  Visit Diagnosis: Difficulty in walking, not elsewhere classified - Plan: PT plan of care cert/re-cert  Muscle weakness (generalized) - Plan: PT plan of care cert/re-cert  Abnormal posture - Plan: PT plan of care cert/re-cert     Problem List Patient Active Problem List   Diagnosis Date Noted  . Varicose veins of leg with complications 46/56/8127  . Chronic venous insufficiency 06/23/2015       Geraldine Solar PT, DPT  [facility] [address] [place], [place], [address] Phone: [phone]   Fax:  [phone]  Name: Betty Bryan  MRN: [id number] Date of Birth: 1956-01-08

## 2017-11-26 ENCOUNTER — Ambulatory Visit (HOSPITAL_COMMUNITY): Payer: BLUE CROSS/BLUE SHIELD | Attending: Internal Medicine

## 2017-11-26 ENCOUNTER — Encounter (HOSPITAL_COMMUNITY): Payer: Self-pay

## 2017-11-26 DIAGNOSIS — M6281 Muscle weakness (generalized): Secondary | ICD-10-CM | POA: Diagnosis not present

## 2017-11-26 DIAGNOSIS — R262 Difficulty in walking, not elsewhere classified: Secondary | ICD-10-CM

## 2017-11-26 DIAGNOSIS — R293 Abnormal posture: Secondary | ICD-10-CM

## 2017-11-26 DIAGNOSIS — R2681 Unsteadiness on feet: Secondary | ICD-10-CM | POA: Insufficient documentation

## 2017-11-26 NOTE — Therapy (Signed)
[place], [place], [address] Phone: [phone]   Fax:  [phone]  Physical Therapy Treatment  Patient Details  Name: Betty Bryan MRN: [id number] Date of Birth: 1956/06/06 Referring Provider: Jerlyn Ly, MD   Encounter Date: 11/26/2017  PT End of Session - 11/26/17 1510    Visit Number  2    Number of Visits  13    Date for PT Re-Evaluation  12/11/17    Authorization Type  BCBS Other    Authorization Time Period  11/20/17 to 01/01/18    PT Start Time  1433    PT Stop Time  1515    PT Time Calculation (min)  42 min    Activity Tolerance  Patient tolerated treatment well;No increased pain    Behavior During Therapy  WFL for tasks assessed/performed       Past Medical History:  Diagnosis Date  . Adrenal insufficiency ([place])   . HTN (hypertension)   . L4 vertebral fracture (H[facility])    L4-L5 fracture  . Palpitations   . PVC's (premature ventricular contractions)   . Ulcerative colitis     Past Surgical History:  Procedure Laterality Date  . HAND SURGERY    . NASAL SINUS SURGERY      There were no vitals filed for this visit.  Subjective Assessment - 11/26/17 1441    Subjective  Pt stated she has more pain later in the day in Lt lower back and radicular pain on Lt lateral hip pain ending at knee, current pain scale 5/10 Lt LBP and numbness down Lt Le.      Pertinent History  PVCs, HTN, adrenal insufficiency, chronic steroid use due to PMH conditions (including steriod myopathy per patient)    Patient Stated Goals  walk longer, stand longer, perform stairs better    Currently in Pain?  Yes    Pain Score  5     Pain Location  Back         [facility] PT Assessment - 11/26/17 0001      Strength   Right Hip External Rotation   3+/5    Right Hip Internal Rotation  3+/5    Left Hip External Rotation  3/5    Left Hip Internal Rotation  3+/5      6 minute walk test results    Aerobic Endurance Distance  Walked  1130    Endurance additional comments  6MWT (1 seated rest break required due to fatgiue), no AD or LOB                  [facility] Adult PT Treatment/Exercise - 11/26/17 0001      Bed Mobility   Bed Mobility  Sit to Sidelying Left    Sit to Sidelying Left  5: Supervision    Sit to Sidelying Left Details (indicate cue type and reason)  Educated on proper bed mobility to reduce stress on back      Exercises   Exercises  Lumbar      Lumbar Exercises: Stretches   Active Hamstring Stretch  2 reps;30 seconds    Active Hamstring Stretch Limitations  supine with hands behind knees    Figure 4 Stretch  3 reps;30 seconds    Figure 4 Stretch Limitations  seated stretch      Lumbar Exercises: Seated   Sit to Stand  10 reps    Sit to Stand Limitations  no HHA, cueing for  eccentric control      Lumbar Exercises: Supine   Bridge  10 reps    Bridge Limitations  2 sets             PT Education - 11/26/17 1507    Education provided  Yes    Education Details  Reviewed goals, assured complaince and proper form wtih HEP, copy of eval given to pt.  Pt educated on bed mobility and encoruaged to progress to sleeping on bed wihtout wedge for lumbar flexion position.      Person(s) Educated  Patient    Methods  Explanation;Demonstration;Handout    Comprehension  Verbalized understanding;Returned demonstration;Need further instruction       PT Short Term Goals - 11/20/17 1638      PT SHORT TERM GOAL #1   Title  Pt will be independent with HEP and perform consistently in order to improve strength and maximize QOL.    Time  3    Period  Weeks    Status  New    Target Date  12/11/17      PT SHORT TERM GOAL #2   Title  Pt will have 1/2 grade improvement in MMT of all muscle groups tested in order to maximize gait, balance, and decrease pain.    Time  3    Period  Weeks    Status  New      PT SHORT TERM GOAL #3   Title  Pt will be able to perform bil SLS for at least 10  sec with no UE and min to no unsteadiness in order to improve gait and maximize confidence with stair ambulation.    Time  3    Period  Weeks    Status  New        PT Long Term Goals - 11/20/17 1641      PT LONG TERM GOAL #1   Title  Pt will have 1 grade improvement in MMT of all muscle groups tested in order to further maximize gait, balance, and stair ambulation.    Time  6    Period  Weeks    Status  New    Target Date  01/01/18      PT LONG TERM GOAL #2   Title  Pt will be able to ambulate at least 1449ft or > during 6MWT to demo improved functional and BLE strength and to maximize pt's community ambulation to allow her to go grocery shopping with greater ease.    Time  6    Period  Weeks    Status  New      PT LONG TERM GOAL #3   Title  Pt will report being able to stand for at least 15 mins to allow her to perform self-grooming and hosuehold chores with greater ease.    Time  6    Period  Weeks    Status  New      PT LONG TERM GOAL #4   Title  Pt will report compliance with regular walking program at least 3x/week for at least 15 mins or more each session to decrease pain, maximize functional and BLE strength, and allow pt to access her community with greater ease.    Time  6    Period  Weeks    Status  New            Plan - 11/26/17 1536    Clinical Impression Statement  Reviewed goals, assured complaince and proper  form/technqiue with HEP and copy of eval given to pt.  6MWT complete with no AD, able to ambulate 1130 ft wiht one seated rest break required due to fatigue.  MMT complete for Bil hip IR/ER as well.  Pt reports she has been currently sleeping on couch with wedge, pt educated on lumbar roll to improve bed mobility and encouraged to return to bed as able.  Therex focus on proximal strengthening to improve gluteal strengthening for pain control and to improve gait mechanics.  Pt able to ambulate reciprocal pattern ascending, required cueing to reduce  rotation and HHA descending due to fear of falling and LE weakness.  No reports of increased pain through session, was limited by fatigue.      Rehab Potential  Good    PT Frequency  2x / week    PT Duration  6 weeks    PT Treatment/Interventions  ADLs/Self Care Home Management;Cryotherapy;Electrical Stimulation;Moist Heat;DME Instruction;Gait training;Stair training;Functional mobility training;Therapeutic activities;Therapeutic exercise;Balance training;Neuromuscular re-education;Cognitive remediation;Patient/family education;Manual techniques;Dry needling;Energy conservation;Taping    PT Next Visit Plan  Next session continue with core,gluteal and postural strengthening and L HS stretches.  Next session trial ITB stretch, and SKTC and DKTC for paraspinal stretch; initiate BLE, functional, core, and postural strengthening. manual for soft tissue restrictions    PT Home Exercise Plan  eval: bridging, seated piriformis stretch, sit to stand       Patient will benefit from skilled therapeutic intervention in order to improve the following deficits and impairments:  Decreased activity tolerance, Decreased balance, Decreased endurance, Decreased mobility, Decreased range of motion, Decreased strength, Difficulty walking, Increased fascial restricitons, Increased muscle spasms, Improper body mechanics, Postural dysfunction, Pain  Visit Diagnosis: Difficulty in walking, not elsewhere classified  Muscle weakness (generalized)  Abnormal posture  Unsteadiness on feet     Problem List Patient Active Problem List   Diagnosis Date Noted  . Varicose veins of leg with complications 47/65/4650  . Chronic venous insufficiency 06/23/2015   Ihor Austin, LPTA; CBIS [phone]  Aldona Lento 11/26/2017, 5:13 PM  [facility] [address] [place], [place], [address] Phone: [phone]   Fax:  [phone]  Name: Betty Bryan MRN:  [id number] Date of Birth: 03-Oct-1955

## 2017-12-02 ENCOUNTER — Encounter (HOSPITAL_COMMUNITY): Payer: BLUE CROSS/BLUE SHIELD

## 2017-12-04 ENCOUNTER — Ambulatory Visit (HOSPITAL_COMMUNITY): Payer: BLUE CROSS/BLUE SHIELD

## 2017-12-04 ENCOUNTER — Encounter (HOSPITAL_COMMUNITY): Payer: Self-pay

## 2017-12-04 DIAGNOSIS — M6281 Muscle weakness (generalized): Secondary | ICD-10-CM

## 2017-12-04 DIAGNOSIS — R293 Abnormal posture: Secondary | ICD-10-CM | POA: Diagnosis not present

## 2017-12-04 DIAGNOSIS — R262 Difficulty in walking, not elsewhere classified: Secondary | ICD-10-CM

## 2017-12-04 DIAGNOSIS — R2681 Unsteadiness on feet: Secondary | ICD-10-CM | POA: Diagnosis not present

## 2017-12-04 NOTE — Therapy (Signed)
[place], [place], [address] Phone: [phone]   Fax:  [phone]  Physical Therapy Treatment  Patient Details  Name: Betty Bryan MRN: [id number] Date of Birth: [birth year] Referring Provider: Jerlyn Ly, MD   Encounter Date: 12/04/2017  PT End of Session - 12/04/17 1528    Visit Number  3    Number of Visits  13    Date for PT Re-Evaluation  01/01/18 minireassess 12/11/17    Authorization Type  [employer] Other    Authorization Time Period  11/20/17 to 01/01/18    PT Start Time  1523 delay signing up at entrance    PT Stop Time  1603    PT Time Calculation (min)  40 min    Activity Tolerance  Patient tolerated treatment well;No increased pain    Behavior During Therapy  WFL for tasks assessed/performed       Past Medical History:  Diagnosis Date  . Adrenal insufficiency ([place])   . HTN (hypertension)   . L4 vertebral fracture ([facility])    L4-L5 fracture  . Palpitations   . PVC's (premature ventricular contractions)   . Ulcerative colitis     Past Surgical History:  Procedure Laterality Date  . HAND SURGERY    . NASAL SINUS SURGERY      There were no vitals filed for this visit.  Subjective Assessment - 12/04/17 1524    Subjective  Pt stated she has been on her feet and active with work, current pain scale 5/10 Lt sided LBP.  Reports difficulty sleeping on her own bed, continues to sleep on sofa    Pertinent History  PVCs, HTN, adrenal insufficiency, chronic steroid use due to PMH conditions (including steriod myopathy per patient)    Patient Stated Goals  walk longer, stand longer, perform stairs better    Currently in Pain?  Yes    Pain Score  5     Pain Location  Back    Pain Orientation  Left    Pain Type  Chronic pain    Pain Onset  More than a month ago    Pain Frequency  Intermittent    Aggravating Factors   unsure    Pain Relieving Factors  unsure    Effect of Pain on Daily Activities  not  doing much of anything                      [facility] Adult PT Treatment/Exercise - 12/04/17 0001      Bed Mobility   Bed Mobility  Sit to Sidelying Left    Sit to Sidelying Left  5: Supervision    Sit to Sidelying Left Details (indicate cue type and reason)  Reviewed mechanics and educated importance      Lumbar Exercises: Stretches   Active Hamstring Stretch  2 reps;30 seconds    Active Hamstring Stretch Limitations  supine with hands behind knees    Single Knee to Chest Stretch  2 reps;Right;Left;30 seconds      Lumbar Exercises: Supine   Other Supine Lumbar Exercises  Decompression 1-5 5 reps 5" holds               PT Short Term Goals - 11/20/17 1638      PT SHORT TERM GOAL #1   Title  Pt will be independent with HEP and perform consistently in order to improve strength and maximize QOL.    Time  3    Period  Weeks    Status  New    Target Date  12/11/17      PT SHORT TERM GOAL #2   Title  Pt will have 1/2 grade improvement in MMT of all muscle groups tested in order to maximize gait, balance, and decrease pain.    Time  3    Period  Weeks    Status  New      PT SHORT TERM GOAL #3   Title  Pt will be able to perform bil SLS for at least 10 sec with no UE and min to no unsteadiness in order to improve gait and maximize confidence with stair ambulation.    Time  3    Period  Weeks    Status  New        PT Long Term Goals - 11/20/17 1641      PT LONG TERM GOAL #1   Title  Pt will have 1 grade improvement in MMT of all muscle groups tested in order to further maximize gait, balance, and stair ambulation.    Time  6    Period  Weeks    Status  New    Target Date  01/01/18      PT LONG TERM GOAL #2   Title  Pt will be able to ambulate at least 1478ft or > during 6MWT to demo improved functional and BLE strength and to maximize pt's community ambulation to allow her to go grocery shopping with greater ease.    Time  6    Period  Weeks    Status   New      PT LONG TERM GOAL #3   Title  Pt will report being able to stand for at least 15 mins to allow her to perform self-grooming and hosuehold chores with greater ease.    Time  6    Period  Weeks    Status  New      PT LONG TERM GOAL #4   Title  Pt will report compliance with regular walking program at least 3x/week for at least 15 mins or more each session to decrease pain, maximize functional and BLE strength, and allow pt to access her community with greater ease.    Time  6    Period  Weeks    Status  New            Plan - 12/04/17 1540    Clinical Impression Statement  Added decompressions exercises to improve tolerance for supine position and postural strengthening.  Therapist facilition to improve form and stability with task.  No reports of pain through session.      Rehab Potential  Good    PT Frequency  2x / week    PT Duration  6 weeks    PT Treatment/Interventions  ADLs/Self Care Home Management;Cryotherapy;Electrical Stimulation;Moist Heat;DME Instruction;Gait training;Stair training;Functional mobility training;Therapeutic activities;Therapeutic exercise;Balance training;Neuromuscular re-education;Cognitive remediation;Patient/family education;Manual techniques;Dry needling;Energy conservation;Taping    PT Next Visit Plan  F/u with core with RTB decompression exercises.  Progress core and gluteal strengthening/Functional strengthening with stairs and STS with extension based strengthening exercises.  Stretches to Lt HS, SKTC/DKTC for paraspinal stretches.  Manual for soft tissue restrictions PRN.      PT Home Exercise Plan  eval: bridging, seated piriformis stretch, sit to stand; 03/14: Decompression exercises 1-5 and with RTB       Patient will benefit from skilled therapeutic intervention in order to  improve the following deficits and impairments:  Decreased activity tolerance, Decreased balance, Decreased endurance, Decreased mobility, Decreased range of motion,  Decreased strength, Difficulty walking, Increased fascial restricitons, Increased muscle spasms, Improper body mechanics, Postural dysfunction, Pain  Visit Diagnosis: Difficulty in walking, not elsewhere classified  Muscle weakness (generalized)  Abnormal posture     Problem List Patient Active Problem List   Diagnosis Date Noted  . Varicose veins of leg with complications 96/12/5407  . Chronic venous insufficiency 06/23/2015   Ihor Austin, LPTA; CBIS [phone]  Aldona Lento 12/04/2017, 3:59 PM  [facility] [address] [place], [place], [address] Phone: [phone]   Fax:  [phone]  Name: MATY ZEISLER MRN: [id number] Date of Birth: [birth year]

## 2017-12-05 ENCOUNTER — Telehealth (HOSPITAL_COMMUNITY): Payer: Self-pay

## 2017-12-05 NOTE — Telephone Encounter (Signed)
Patient will not be able to make it today her job called a meeting and she have to be there

## 2017-12-08 ENCOUNTER — Encounter (HOSPITAL_COMMUNITY): Payer: BLUE CROSS/BLUE SHIELD

## 2017-12-10 ENCOUNTER — Ambulatory Visit (HOSPITAL_COMMUNITY): Payer: BLUE CROSS/BLUE SHIELD

## 2017-12-10 ENCOUNTER — Telehealth (HOSPITAL_COMMUNITY): Payer: Self-pay | Admitting: Internal Medicine

## 2017-12-10 NOTE — Telephone Encounter (Signed)
320/19  pt left a message that she was feeling under the weather today

## 2017-12-15 ENCOUNTER — Ambulatory Visit (HOSPITAL_COMMUNITY): Payer: BLUE CROSS/BLUE SHIELD

## 2017-12-15 ENCOUNTER — Encounter (HOSPITAL_COMMUNITY): Payer: Self-pay

## 2017-12-15 DIAGNOSIS — R262 Difficulty in walking, not elsewhere classified: Secondary | ICD-10-CM | POA: Diagnosis not present

## 2017-12-15 DIAGNOSIS — R2681 Unsteadiness on feet: Secondary | ICD-10-CM | POA: Diagnosis not present

## 2017-12-15 DIAGNOSIS — M6281 Muscle weakness (generalized): Secondary | ICD-10-CM

## 2017-12-15 DIAGNOSIS — R293 Abnormal posture: Secondary | ICD-10-CM | POA: Diagnosis not present

## 2017-12-15 NOTE — Therapy (Signed)
[place], [place], [address] Phone: [phone]   Fax:  [phone]  Physical Therapy Treatment/Reassessment  Patient Details  Name: Betty Bryan MRN: [id number] Date of Birth: 30-Sep-1955 Referring Provider: Jerlyn Ly, MD   Encounter Date: 12/15/2017  PT End of Session - 12/15/17 1519    Visit Number  4    Number of Visits  13    Date for PT Re-Evaluation  01/01/18    Authorization Type  BCBS Other    Authorization Time Period  11/20/17 to 01/01/18    PT Start Time  1520    PT Stop Time  1606    PT Time Calculation (min)  46 min    Activity Tolerance  Patient tolerated treatment well;No increased pain    Behavior During Therapy  WFL for tasks assessed/performed       Past Medical History:  Diagnosis Date  . Adrenal insufficiency ([facility])   . HTN (hypertension)   . L4 vertebral fracture ([facility])    L4-L5 fracture  . Palpitations   . PVC's (premature ventricular contractions)   . Ulcerative colitis     Past Surgical History:  Procedure Laterality Date  . HAND SURGERY    . NASAL SINUS SURGERY      There were no vitals filed for this visit.  Subjective Assessment - 12/15/17 1522    Subjective  Pt reports that today she is in quite a bit of pain in her lower back. She does feel like the piriformis stretch has helped her L leg pain. She states that she is doing more at work than she has been.     Pertinent History  PVCs, HTN, adrenal insufficiency, chronic steroid use due to PMH conditions (including steriod myopathy per patient)    Patient Stated Goals  walk longer, stand longer, perform stairs better    Currently in Pain?  Yes    Pain Score  5     Pain Location  Back    Pain Orientation  Mid;Lower    Pain Descriptors / Indicators  Aching;Dull;Tightness    Pain Type  Chronic pain    Pain Onset  More than a month ago    Pain Frequency  Intermittent    Aggravating Factors   unsure    Pain Relieving  Factors  unsure    Effect of Pain on Daily Activities  not doing much of anything         [facility] PT Assessment - 12/15/17 0001      Assessment   Medical Diagnosis  LBP    Referring Provider  Jerlyn Ly, MD    Onset Date/Surgical Date  -- years ago    Next MD Visit  May 2019    Prior Therapy  PT for plantar fascitis      Strength   Right Hip Flexion  5/5 was 4+    Right Hip Extension  2+/5 was 2+    Right Hip External Rotation   4+/5 was 3+    Right Hip Internal Rotation  5/5 was 3+    Right Hip ABduction  4/5 was 4-    Left Hip Flexion  5/5 was 4+    Left Hip Extension  3-/5 was 3-    Left Hip External Rotation  4/5 was 3    Left Hip Internal Rotation  5/5 was 3+    Left Hip ABduction  4/5 was 4-    Right Knee  Flexion  4+/5 was 4+    Right Knee Extension  5/5    Left Knee Flexion  4+/5 was 4    Left Knee Extension  5/5 was 4+    Right Ankle Dorsiflexion  4+/5 was 4+    Left Ankle Dorsiflexion  4+/5 was 4+      6 minute walk test results    Aerobic Endurance Distance Walked  1252    Endurance additional comments  6MWT (1 seated rest break required due to fatgiue), no AD or LOB      Balance   Balance Assessed  Yes      Static Standing Balance   Static Standing - Balance Support  No upper extremity supported    Static Standing Balance -  Activities   Single Leg Stance - Right Leg;Single Leg Stance - Left Leg    Static Standing - Comment/# of Minutes  R: 11 sec L: 20 sec           [facility] Adult PT Treatment/Exercise - 12/15/17 0001      Lumbar Exercises: Supine   Bridge  10 reps    Other Supine Lumbar Exercises  Decompression 1-5, RTB, 5x5" holds      Lumbar Exercises: Sidelying   Clam  Both;10 reps;Limitations    Clam Limitations  RTB           PT Education - 12/15/17 1520    Education provided  Yes    Education Details  reassessment findings, HEP review and additions    Person(s) Educated  Patient    Methods  Explanation;Demonstration;Handout     Comprehension  Verbalized understanding;Returned demonstration       PT Short Term Goals - 12/15/17 1528      PT SHORT TERM GOAL #1   Title  Pt will be independent with HEP and perform consistently in order to improve strength and maximize QOL.    Time  3    Period  Weeks    Status  Achieved      PT SHORT TERM GOAL #2   Title  Pt will have 1/2 grade improvement in MMT of all muscle groups tested in order to maximize gait, balance, and decrease pain.    Time  3    Period  Weeks    Status  Partially Met      PT SHORT TERM GOAL #3   Title  Pt will be able to perform bil SLS for at least 10 sec with no UE and min to no unsteadiness in order to improve gait and maximize confidence with stair ambulation.    Baseline  3/25: L: 20 sec, R: 11sec    Time  3    Period  Weeks    Status  Achieved      PT SHORT TERM GOAL #4   Title  Pt will be able to confidently ascend/descend a full flight of stairs (approximately 12 stairs) with 1 handrail assist or < with min to no unsteadiness and good eccentric control to demonstrate improved functional hip strength and dynamic balance.    Time  3    Period  Weeks    Target Date  01/05/18      PT SHORT TERM GOAL #5   Title  Pt will have min to no [place] sign during single limb activities (gait, SLS, stairs, etc.) to demo improved functional hip strength of RLE in order to normalize gait pattern and decrease overall LBP.    Time  3    Status  New        PT Long Term Goals - 12/15/17 1528      PT LONG TERM GOAL #1   Title  Pt will have 1 grade improvement in MMT of all muscle groups tested in order to further maximize gait, balance, and stair ambulation.    Time  6    Period  Weeks    Status  On-going      PT LONG TERM GOAL #2   Title  Pt will be able to ambulate at least 1442f or > during 6MWT to demo improved functional and BLE strength and to maximize pt's community ambulation to allow her to go grocery shopping with greater ease.     Baseline  3/25: 12580f   Time  6    Period  Weeks    Status  On-going      PT LONG TERM GOAL #3   Title  Pt will report being able to stand for at least 15 mins to allow her to perform self-grooming and household chores with greater ease.    Baseline  3/25: 5 mins maybe; sits down to do her make up    Time  6    Period  Weeks    Status  On-going      PT LONG TERM GOAL #4   Title  Pt will report compliance with regular walking program at least 3x/week for at least 15 mins or more each session to decrease pain, maximize functional and BLE strength, and allow pt to access her community with greater ease.    Baseline  3/25: walks at work but not doing a specific walking program; is trying to walk more    Time  6    Period  Weeks    Status  On-going            Plan - 12/15/17 1618    Clinical Impression Statement  PT reassessed pt's goals and outcome measures this date. Pt has made great progress towards goals as illustrated above. Her MMT, with the exception of hip ext and ankle DF, has improved by at least 1/2 grade. Her 6MWT improved to 1,25276fnd her balance has significantly improved. She verbalizes that she is able to stand for a little bit longer and reports that she's able to do a little more at work now. She feels 50-60% improved since starting therapy, reporting that the pain and stiffness and difficulty with stairs are her main remaining complaints. Pt's stairs mainly demo'd decreased eccentric control and subjective reports of fear of falling during descent. Pt has made great progress thus far and needs continued skilled PT intervention to address remaining deficits in order to further improve strength, balance, gait, and functional tasks at home and work. Reviewed pt's current HEP as she had a few questions regarding them; addressed these concerns with no f/u questions and added sidelying clams to HEP as pt continues to demo significant glute med weakness of R hip. Continue POC  as planned, progressing hip, functional, posture and core strengthening as able.     Rehab Potential  Good    PT Frequency  2x / week    PT Duration  6 weeks    PT Treatment/Interventions  ADLs/Self Care Home Management;Cryotherapy;Electrical Stimulation;Moist Heat;DME Instruction;Gait training;Stair training;Functional mobility training;Therapeutic activities;Therapeutic exercise;Balance training;Neuromuscular re-education;Cognitive remediation;Patient/family education;Manual techniques;Dry needling;Energy conservation;Taping    PT Next Visit Plan  F/u with core with RTB decompression exercises. Progress core  and gluteal strengthening/Functional strengthening with stairs and STS with extension based strengthening exercises. Stretches to Lt HS, SKTC/DKTC for paraspinal stretches.  Manual PRN    PT Home Exercise Plan  eval: bridging, seated piriformis stretch, sit to stand; 03/14: Decompression exercises 1-5 and with RTB; 3/25: sidelying clams    Consulted and Agree with Plan of Care  Patient       Patient will benefit from skilled therapeutic intervention in order to improve the following deficits and impairments:  Decreased activity tolerance, Decreased balance, Decreased endurance, Decreased mobility, Decreased range of motion, Decreased strength, Difficulty walking, Increased fascial restricitons, Increased muscle spasms, Improper body mechanics, Postural dysfunction, Pain  Visit Diagnosis: Difficulty in walking, not elsewhere classified  Muscle weakness (generalized)  Abnormal posture  Unsteadiness on feet     Problem List Patient Active Problem List   Diagnosis Date Noted  . Varicose veins of leg with complications 65/79/0383  . Chronic venous insufficiency 06/23/2015       Geraldine Solar PT, DPT  [facility] [address] [place], [place], [address] Phone: [phone]   Fax:  [phone]  Name: LEIGHA OLBERDING MRN:  [id number] Date of Birth: [birth year]

## 2017-12-15 NOTE — Patient Instructions (Addendum)
  ELASTIC BAND - SIDELYING CLAM SHELL - [place]   While lying on your side with your knees bent and an elastic band wrapped around your knees, draw up the top knee while keeping contact of your feet together as shown.   Do not let your pelvis roll back during the lifting movement.    Perform 1x/day, 2-3 sets of 10-15 reps

## 2017-12-17 ENCOUNTER — Telehealth (HOSPITAL_COMMUNITY): Payer: Self-pay | Admitting: Internal Medicine

## 2017-12-17 ENCOUNTER — Encounter (HOSPITAL_COMMUNITY): Payer: BLUE CROSS/BLUE SHIELD

## 2017-12-17 NOTE — Telephone Encounter (Signed)
12/17/17  pt left a message to cx because they are short staffed at her work

## 2017-12-22 ENCOUNTER — Encounter (HOSPITAL_COMMUNITY): Payer: Self-pay

## 2017-12-22 ENCOUNTER — Telehealth (HOSPITAL_COMMUNITY): Payer: Self-pay | Admitting: Internal Medicine

## 2017-12-22 ENCOUNTER — Ambulatory Visit (HOSPITAL_COMMUNITY): Payer: BLUE CROSS/BLUE SHIELD | Attending: Internal Medicine

## 2017-12-22 DIAGNOSIS — R293 Abnormal posture: Secondary | ICD-10-CM | POA: Diagnosis not present

## 2017-12-22 DIAGNOSIS — M6281 Muscle weakness (generalized): Secondary | ICD-10-CM

## 2017-12-22 DIAGNOSIS — R2681 Unsteadiness on feet: Secondary | ICD-10-CM | POA: Diagnosis not present

## 2017-12-22 DIAGNOSIS — R262 Difficulty in walking, not elsewhere classified: Secondary | ICD-10-CM | POA: Diagnosis not present

## 2017-12-22 NOTE — Patient Instructions (Signed)
  Setup  Begin standing upright with your elbows bent and tucked at your sides, holding the ends of a resistance band that is anchored in front of you. Movement  Squeeze your shoulder blades together and downward, pulling against the resistance band. Hold this position, then relax and repeat. Tip  Make sure to keep your back straight and do not shrug your shoulders during the exercise.  Perform 1x/day, 2-3 sets of 10-15 reps with red band   Setup  Begin standing upright with your arms straight forward and palms facing inward, holding the ends of a resistance band that is anchored overhead in front of you. Movement  Pull your arms down to your sides, squeezing your shoulder blades together. Then bring them back up to the starting position and repeat. Tip  Make sure to keep your elbows and back straight, and do not shrug your shoulders during the exercise.  Perform 1x/day, 2-3 sets of 10-15 reps with red band

## 2017-12-22 NOTE — Telephone Encounter (Signed)
12/22/17  pt asked me to cx said they were short staffed at work... I let her know it was the last on the schedule

## 2017-12-22 NOTE — Therapy (Signed)
[facility] [place], [place], [address] Phone: [phone]   Fax:  [phone]  Physical Therapy Treatment  Patient Details  Name: Betty Bryan MRN: [id number] Date of Birth: 22-Mar-1956 Referring Provider: Jerlyn Ly, MD   Encounter Date: 12/22/2017  PT End of Session - 12/22/17 1524    Visit Number  5    Number of Visits  13    Date for PT Re-Evaluation  01/01/18    Authorization Type  BC[employer] Other    Authorization Time Period  11/20/17 to 01/01/18    PT Start Time  1519    PT Stop Time  1607    PT Time Calculation (min)  48 min    Activity Tolerance  Patient tolerated treatment well;No increased pain    Behavior During Therapy  WFL for tasks assessed/performed       Past Medical History:  Diagnosis Date  . Adrenal insufficiency ([place])   . HTN (hypertension)   . L4 vertebral fracture ([facility])    L4-L5 fracture  . Palpitations   . PVC's (premature ventricular contractions)   . Ulcerative colitis     Past Surgical History:  Procedure Laterality Date  . HAND SURGERY    . NASAL SINUS SURGERY      There were no vitals filed for this visit.  Subjective Assessment - 12/22/17 1525    Subjective  Pt reports that she was a little sore following last session and she states that her piriformis is a little sore.     Pertinent History  PVCs, HTN, adrenal insufficiency, chronic steroid use due to PMH conditions (including steriod myopathy per patient)    Patient Stated Goals  walk longer, stand longer, perform stairs better    Currently in Pain?  Yes    Pain Score  4     Pain Location  Back    Pain Orientation  Mid;Lower    Pain Descriptors / Indicators  Aching;Dull;Tiring    Pain Type  Chronic pain    Pain Onset  More than a month ago    Pain Frequency  Intermittent    Aggravating Factors   unsure    Pain Relieving Factors  unsure    Effect of Pain on Daily Activities  not doing much of anything          [facility]  Adult PT Treatment/Exercise - 12/22/17 0001      Lumbar Exercises: Standing   Push / Pull Sled  bil tandem stance on foam 5x10" each    Row  Both;10 reps;Theraband;Limitations    Theraband Level (Row)  Level 2 (Red)    Row Limitations  cues for form    Shoulder Extension  Both;10 reps;Theraband;Limitations    Theraband Level (Shoulder Extension)  Level 2 (Red)    Shoulder Extension Limitations  cues for form    Other Standing Lumbar Exercises  fwd step ups 4" box x10 reps each    Other Standing Lumbar Exercises  standing abd with RTB x10 reps each      Lumbar Exercises: Seated   Other Seated Lumbar Exercises  seated on dyna disc: marching x10 each, LAQ x10 each, trunk rotation with RTB x10 reps each, scap retractions x10 reps RTB, horizontal abd with RTB x10 reps      Lumbar Exercises: Supine   Bridge  15 reps    Straight Leg Raise  10 reps;Limitations    Straight Leg Raises Limitations  BLE, +diaphragmatic breathing  Other Supine Lumbar Exercises  Decompression 1-5, RTB, 10x3" holds      Lumbar Exercises: Sidelying   Hip Abduction  Both;10 reps;Limitations    Hip Abduction Limitations  cues for form          PT Education - 12/22/17 1621    Education provided  Yes    Education Details  exercise technique, updated HEP    Person(s) Educated  Patient    Methods  Explanation;Demonstration;Handout    Comprehension  Verbalized understanding;Need further instruction          PT Short Term Goals - 12/15/17 1528      PT SHORT TERM GOAL #1   Title  Pt will be independent with HEP and perform consistently in order to improve strength and maximize QOL.    Time  3    Period  Weeks    Status  Achieved      PT SHORT TERM GOAL #2   Title  Pt will have 1/2 grade improvement in MMT of all muscle groups tested in order to maximize gait, balance, and decrease pain.    Time  3    Period  Weeks    Status  Partially Met      PT SHORT TERM GOAL #3   Title  Pt will be able to  perform bil SLS for at least 10 sec with no UE and min to no unsteadiness in order to improve gait and maximize confidence with stair ambulation.    Baseline  3/25: L: 20 sec, R: 11sec    Time  3    Period  Weeks    Status  Achieved      PT SHORT TERM GOAL #4   Title  Pt will be able to confidently ascend/descend a full flight of stairs (approximately 12 stairs) with 1 handrail assist or < with min to no unsteadiness and good eccentric control to demonstrate improved functional hip strength and dynamic balance.    Time  3    Period  Weeks    Target Date  01/05/18      PT SHORT TERM GOAL #5   Title  Pt will have min to no [place] sign during single limb activities (gait, SLS, stairs, etc.) to demo improved functional hip strength of RLE in order to normalize gait pattern and decrease overall LBP.    Time  3    Status  New        PT Long Term Goals - 12/15/17 1528      PT LONG TERM GOAL #1   Title  Pt will have 1 grade improvement in MMT of all muscle groups tested in order to further maximize gait, balance, and stair ambulation.    Time  6    Period  Weeks    Status  On-going      PT LONG TERM GOAL #2   Title  Pt will be able to ambulate at least 1466f or > during 6MWT to demo improved functional and BLE strength and to maximize pt's community ambulation to allow her to go grocery shopping with greater ease.    Baseline  3/25: 12524f   Time  6    Period  Weeks    Status  On-going      PT LONG TERM GOAL #3   Title  Pt will report being able to stand for at least 15 mins to allow her to perform self-grooming and household chores with greater ease.  Baseline  3/25: 5 mins maybe; sits down to do her make up    Time  6    Period  Weeks    Status  On-going      PT LONG TERM GOAL #4   Title  Pt will report compliance with regular walking program at least 3x/week for at least 15 mins or more each session to decrease pain, maximize functional and BLE strength, and allow pt  to access her community with greater ease.    Baseline  3/25: walks at work but not doing a specific walking program; is trying to walk more    Time  6    Period  Weeks    Status  On-going            Plan - 12/22/17 1621    Clinical Impression Statement  Continued with established POC focusing on hip, core, and postural strengthening. Min cues for proper form with technique during exercise. Added exercises while seated on dyna disc for improved core strength. Also added postural and hip strengthening in standing. Increased difficulty with LLE back during tandem stance. No pain reported at EOS, just some muscle soreness; PT educated that this was normal. Continue as planned, progressing as able.    Rehab Potential  Good    PT Frequency  2x / week    PT Duration  6 weeks    PT Treatment/Interventions  ADLs/Self Care Home Management;Cryotherapy;Electrical Stimulation;Moist Heat;DME Instruction;Gait training;Stair training;Functional mobility training;Therapeutic activities;Therapeutic exercise;Balance training;Neuromuscular re-education;Cognitive remediation;Patient/family education;Manual techniques;Dry needling;Energy conservation;Taping    PT Next Visit Plan  Add sidestepping; Progress core and gluteal strengthening/Functional strengthening with stairs and STS with extension based strengthening exercises. Stretches to Lt HS, SKTC/DKTC for paraspinal stretches.  Manual PRN    PT Home Exercise Plan  eval: bridging, seated piriformis stretch, sit to stand; 03/14: Decompression exercises 1-5 and with RTB; 3/25: sidelying clams; 4/1: standing scap retraction, GH extension wtih RTB    Consulted and Agree with Plan of Care  Patient       Patient will benefit from skilled therapeutic intervention in order to improve the following deficits and impairments:  Decreased activity tolerance, Decreased balance, Decreased endurance, Decreased mobility, Decreased range of motion, Decreased strength,  Difficulty walking, Increased fascial restricitons, Increased muscle spasms, Improper body mechanics, Postural dysfunction, Pain  Visit Diagnosis: Difficulty in walking, not elsewhere classified  Muscle weakness (generalized)  Abnormal posture  Unsteadiness on feet     Problem List Patient Active Problem List   Diagnosis Date Noted  . Varicose veins of leg with complications 75/06/2584  . Chronic venous insufficiency 06/23/2015       Geraldine Solar PT, DPT  [facility] [address] [place], [place], [address] Phone: [phone]   Fax:  [phone]  Name: NURA CAHOON MRN: [id number] Date of Birth: [birth year]

## 2017-12-24 ENCOUNTER — Encounter (HOSPITAL_COMMUNITY): Payer: BLUE CROSS/BLUE SHIELD

## 2017-12-31 ENCOUNTER — Telehealth (HOSPITAL_COMMUNITY): Payer: Self-pay

## 2017-12-31 ENCOUNTER — Encounter (HOSPITAL_COMMUNITY): Payer: BLUE CROSS/BLUE SHIELD

## 2017-12-31 NOTE — Telephone Encounter (Signed)
Patient is having increased back pain and want to see her MD before returnning to rehab.

## 2018-01-05 ENCOUNTER — Ambulatory Visit (INDEPENDENT_AMBULATORY_CARE_PROVIDER_SITE_OTHER): Payer: BLUE CROSS/BLUE SHIELD

## 2018-01-05 ENCOUNTER — Telehealth (HOSPITAL_COMMUNITY): Payer: Self-pay | Admitting: Internal Medicine

## 2018-01-05 ENCOUNTER — Encounter (INDEPENDENT_AMBULATORY_CARE_PROVIDER_SITE_OTHER): Payer: Self-pay | Admitting: Orthopaedic Surgery

## 2018-01-05 ENCOUNTER — Ambulatory Visit (INDEPENDENT_AMBULATORY_CARE_PROVIDER_SITE_OTHER): Payer: BLUE CROSS/BLUE SHIELD | Admitting: Orthopaedic Surgery

## 2018-01-05 DIAGNOSIS — R0781 Pleurodynia: Secondary | ICD-10-CM

## 2018-01-05 DIAGNOSIS — R0789 Other chest pain: Secondary | ICD-10-CM | POA: Insufficient documentation

## 2018-01-05 NOTE — Telephone Encounter (Signed)
01/05/18  Pt called and wanted to leave a message for Jerene Pitch to say that she had discontinued therapy for a bit because she fell and cracked a rib.  I sent a message to [place] to let her know.

## 2018-01-05 NOTE — Progress Notes (Signed)
Office Visit Note   Patient: Betty Bryan           Date of Birth: 29-Oct-1955           MRN: [id number] Visit Date: 01/05/2018              Requested by: Crist Infante, MD [address] [place], [place] [address] PCP: Crist Infante, MD   Assessment & Plan: Visit Diagnoses:  1. Rib pain on right side     Plan: Impression is costochondritis versus nondisplaced rib fractures.  This point, we have discussed further diagnostic imaging to include CT.  I discussed that this would not change our treatment plan to the patient does not wish to proceed.  She is on chronic prednisone and we will not add any anti-inflammatories to this.  She will continue to take deep breaths to prevent pneumonia.  She will follow-up with [place] in 8 weeks time for repeat examination.  She will call with concerns or questions in the meantime.  Follow-Up Instructions: Return in about 8 weeks (around 03/02/2018).   Orders:  Orders Placed This Encounter  Procedures  . XR Ribs Unilateral Right   No orders of the defined types were placed in this encounter.     Procedures: No procedures performed   Clinical Data: No additional findings.   Subjective: No chief complaint on file.   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  This began this past Friday when she slipped on a piece of paper while at work landing on her left knee and then further falling on her back.  She is unsure whether she rotated her core during the fall.  Since then she has had pain to the right side of the ribs.  This is worse sitting as well as lying flat on her back.  She does get some relief standing as well as walking.  She also gets relief with ice.  She does admit to broken ribs in the past and states that this feels very similar.  Of note, she does have a recent history of L4 compression fracture as well as an L5 burst fracture.  These were both atraumatic.   She was supposed to undergo a kyphoplasty but developed a DVT to the right lower extremity preop.  Review of Systems as detailed in HPI.  All others reviewed and are negative.   Objective: Vital Signs: There were no vitals taken for this visit.  Physical Exam well-developed well-nourished female in no acute distress.  Alert and oriented x3.  Ortho Exam examination of her chest wall reveals marked tenderness to the ribs 4 through 12.  She has increased pain with lumbar flexion.  No pain with extension.  She also has pain with right-sided rotation.  Specialty Comments:  No specialty comments available.  Imaging: Xr Ribs Unilateral Right  Result Date: 01/05/2018 Questionable nondisplaced rib fractures that T9 and T10    PMFS History: Patient Active Problem List   Diagnosis Date Noted  . Rib pain on right side 01/05/2018  . Varicose veins of leg with complications 51/10/5850  . Chronic venous insufficiency 06/23/2015   Past Medical History:  Diagnosis Date  . Adrenal insufficiency ([place])   . HTN (hypertension)   . L4 vertebral fracture ([facility])    L4-L5 fracture  . Palpitations   . PVC's (premature ventricular contractions)   . Ulcerative colitis     Family History  Problem Relation Age of Onset  . Heart failure Unknown   . Diabetes Father   . Heart disease Father   . Hypertension Father     Past Surgical History:  Procedure Laterality Date  . HAND SURGERY    . NASAL SINUS SURGERY     Social History   Occupational History  . Occupation: Biochemist, clinical  Tobacco Use  . Smoking status: Never Smoker  . Smokeless tobacco: Never Used  Substance and Sexual Activity  . Alcohol use: No    Alcohol/week: 0.0 oz  . Drug use: No  . Sexual activity: Not on file

## 2018-01-21 DIAGNOSIS — M5416 Radiculopathy, lumbar region: Secondary | ICD-10-CM | POA: Diagnosis not present

## 2018-01-21 DIAGNOSIS — Z86718 Personal history of other venous thrombosis and embolism: Secondary | ICD-10-CM | POA: Diagnosis not present

## 2018-01-21 DIAGNOSIS — R0789 Other chest pain: Secondary | ICD-10-CM | POA: Diagnosis not present

## 2018-01-21 DIAGNOSIS — R1010 Upper abdominal pain, unspecified: Secondary | ICD-10-CM | POA: Diagnosis not present

## 2018-02-20 DIAGNOSIS — D8989 Other specified disorders involving the immune mechanism, not elsewhere classified: Secondary | ICD-10-CM | POA: Diagnosis not present

## 2018-02-20 DIAGNOSIS — M5416 Radiculopathy, lumbar region: Secondary | ICD-10-CM | POA: Diagnosis not present

## 2018-02-20 DIAGNOSIS — I8291 Chronic embolism and thrombosis of unspecified vein: Secondary | ICD-10-CM | POA: Diagnosis not present

## 2018-02-20 DIAGNOSIS — E119 Type 2 diabetes mellitus without complications: Secondary | ICD-10-CM | POA: Diagnosis not present

## 2018-02-24 DIAGNOSIS — K5150 Left sided colitis without complications: Secondary | ICD-10-CM | POA: Diagnosis not present

## 2018-03-09 ENCOUNTER — Ambulatory Visit (INDEPENDENT_AMBULATORY_CARE_PROVIDER_SITE_OTHER): Payer: BLUE CROSS/BLUE SHIELD | Admitting: Orthopaedic Surgery

## 2018-03-10 ENCOUNTER — Ambulatory Visit (INDEPENDENT_AMBULATORY_CARE_PROVIDER_SITE_OTHER): Payer: BLUE CROSS/BLUE SHIELD | Admitting: Orthopaedic Surgery

## 2018-04-15 DIAGNOSIS — M545 Low back pain: Secondary | ICD-10-CM | POA: Diagnosis not present

## 2018-05-06 DIAGNOSIS — M545 Low back pain: Secondary | ICD-10-CM | POA: Diagnosis not present

## 2018-05-21 DIAGNOSIS — M545 Low back pain: Secondary | ICD-10-CM | POA: Diagnosis not present

## 2018-05-28 DIAGNOSIS — M545 Low back pain: Secondary | ICD-10-CM | POA: Diagnosis not present

## 2018-06-02 ENCOUNTER — Ambulatory Visit: Payer: BLUE CROSS/BLUE SHIELD

## 2018-06-02 ENCOUNTER — Other Ambulatory Visit: Payer: Self-pay | Admitting: Podiatry

## 2018-06-02 ENCOUNTER — Ambulatory Visit: Payer: BLUE CROSS/BLUE SHIELD | Admitting: Podiatry

## 2018-06-02 ENCOUNTER — Encounter: Payer: Self-pay | Admitting: Podiatry

## 2018-06-02 DIAGNOSIS — M79671 Pain in right foot: Secondary | ICD-10-CM

## 2018-06-02 DIAGNOSIS — M79672 Pain in left foot: Principal | ICD-10-CM

## 2018-06-02 DIAGNOSIS — M84375D Stress fracture, left foot, subsequent encounter for fracture with routine healing: Secondary | ICD-10-CM

## 2018-06-02 DIAGNOSIS — M84374D Stress fracture, right foot, subsequent encounter for fracture with routine healing: Secondary | ICD-10-CM

## 2018-06-04 ENCOUNTER — Encounter (HOSPITAL_COMMUNITY): Payer: Self-pay

## 2018-06-04 NOTE — Therapy (Signed)
[facility] [place], [place], [address] Phone: [phone]   Fax:  [phone]  Patient Details  Name: Betty Bryan MRN: [id number] Date of Birth: [birth year] Referring Provider:  No ref. provider found  Encounter Date: 06/04/2018  PHYSICAL THERAPY DISCHARGE SUMMARY  Visits from Start of Care: 5  Current functional level related to goals / functional outcomes: See last note   Remaining deficits: See last note   Education / Equipment: n/a  Plan: Patient agrees to discharge.  Patient goals were partially met. Patient is being discharged due to not returning since the last visit.  ?????     Geraldine Solar PT, [facility] [address] [place], [place], [address] Phone: [phone]   Fax:  [phone]

## 2018-06-04 NOTE — Progress Notes (Signed)
   HPI: 62 year old female presenting today for follow up evaluation of stress fractures on the lateral aspects of bilateral feet that occurred one year ago. She states she is doing well and just wants the feet evaluated. She is currently taking Vicodin for back pain so she denies any current foot pain. There are no modifying factors noted. Patient is here for further evaluation and treatment.   Past Medical History:  Diagnosis Date  . Adrenal insufficiency ([place])   . HTN (hypertension)   . L4 vertebral fracture (HCC)    L4-L5 fracture  . Palpitations   . PVC's (premature ventricular contractions)   . Ulcerative colitis      Physical Exam: General: The patient is alert and oriented x3 in no acute distress.  Dermatology: Skin is warm, dry and supple bilateral lower extremities. Negative for open lesions or macerations.  Vascular: Palpable pedal pulses bilaterally. No edema or erythema noted. Capillary refill within normal limits.  Neurological: Epicritic and protective threshold grossly intact bilaterally.   Musculoskeletal Exam: Range of motion within normal limits to all pedal and ankle joints bilateral. Muscle strength 5/5 in all groups bilateral.   Radiographic Exam:  Normal osseous mineralization. Joint spaces preserved. No fracture/dislocation/boney destruction.    Assessment: 1. H/o stress fractures bilaterally   Plan of Care:  1. Patient evaluated. X-Rays reviewed.  2. Recommended good shoe gear.  3. Continue water aerobics.  4. Return to clinic as needed.   Patient goes by [name]. Patient sees Dr. Haynes Kerns at [facility].     Edrick Kins, DPM [facility]  Dr. Edrick Kins, DPM    [address], [place] [address]                Office [phone]  Fax [phone]

## 2018-06-08 ENCOUNTER — Ambulatory Visit: Payer: BLUE CROSS/BLUE SHIELD | Admitting: Podiatry

## 2018-06-18 DIAGNOSIS — M545 Low back pain: Secondary | ICD-10-CM | POA: Diagnosis not present

## 2018-07-09 DIAGNOSIS — M545 Low back pain: Secondary | ICD-10-CM | POA: Diagnosis not present

## 2018-08-12 ENCOUNTER — Ambulatory Visit (INDEPENDENT_AMBULATORY_CARE_PROVIDER_SITE_OTHER): Payer: Self-pay

## 2018-08-12 ENCOUNTER — Ambulatory Visit (INDEPENDENT_AMBULATORY_CARE_PROVIDER_SITE_OTHER): Payer: BLUE CROSS/BLUE SHIELD | Admitting: Family

## 2018-08-12 ENCOUNTER — Encounter (INDEPENDENT_AMBULATORY_CARE_PROVIDER_SITE_OTHER): Payer: Self-pay | Admitting: Family

## 2018-08-12 VITALS — Ht 62.0 in | Wt 140.0 lb

## 2018-08-12 DIAGNOSIS — M25551 Pain in right hip: Secondary | ICD-10-CM

## 2018-08-12 MED ORDER — PREDNISONE 10 MG PO TABS: 10.0000 mg | ORAL_TABLET | Freq: Every day | ORAL | 0 refills | Status: DC

## 2018-08-12 NOTE — Progress Notes (Signed)
Office Visit Note   Patient: Betty Bryan           Date of Birth: 1955/10/14           MRN: [id number] Visit Date: 08/12/2018              Requested by: Crist Infante, MD [address] [place], [place] [address] PCP: Crist Infante, MD  Chief Complaint  Patient presents with  . Right Hip - Pain      HPI: Patient is a 62 year old woman presents today complaining of right hip pain.  This has been ongoing for about 10 days.  She has been recovering from a compression fracture of L4-L5 which was about a year ago.  States she is unsure she will make any more progress is had constant low back pain.  However it has recently been worse right-sided low back pain lateral hip pain pain that radiates down to her anterior knee.  No recent injuries.  Worse pain with weightbearing no relieving factors.  Of note she does take 5 mg of prednisone daily for her Addison's disease.  Patient concerned her pain is right hip bursitis.  Assessment & Plan: Visit Diagnoses:  1. Pain in right hip     Plan: working diagnosis is Lumbosacral radiculopathy. Will trial at 20 mg prednisone daily for 5 days she will take discussed this.  She will be seeing her primary care Tuesday of next week.    If no relief may consider a cortisone injection for bursitis.  Follow-Up Instructions: No follow-ups on file.   Right Hip Exam   Tenderness  The patient is experiencing tenderness in the greater trochanter (mild).  Range of Motion  The patient has normal right hip ROM.  Tests  FABER: negative  Other  Sensation: normal   Back Exam   Tenderness  The patient is experiencing tenderness in the lumbar.  Range of Motion  The patient has normal back ROM.  Tests  Straight leg raise right: positive Straight leg raise left: negative  Comments:  Walks with rolling walker      Patient is alert, oriented, no adenopathy, well-dressed, normal affect, normal respiratory effort.   Imaging: No results  found. No images are attached to the encounter.  Labs: No results found for: HGBA1C, ESRSEDRATE, CRP, LABURIC, REPTSTATUS, GRAMSTAIN, CULT, LABORGA   Lab Results  Component Value Date   ALBUMIN 3.8 07/11/2017   ALBUMIN 3.3 (L) 07/08/2017   ALBUMIN 4.3 12/24/2007    Body mass index is 25.61 kg/m.  Orders:  Orders Placed This Encounter  Procedures  . XR Pelvis 1-2 Views  . XR Lumbar Spine 2-3 Views   Meds ordered this encounter  Medications  . predniSONE (DELTASONE) 10 MG tablet    Sig: Take 1 tablet (10 mg total) by mouth daily.    Dispense:  30 tablet    Refill:  0     Procedures: No procedures performed  Clinical Data: No additional findings.  ROS:  All other systems negative, except as noted in the HPI. Review of Systems  Constitutional: Negative for chills and fever.  Musculoskeletal: Positive for arthralgias and back pain. Negative for myalgias.    Objective: Vital Signs: Ht 5\' 2"  (1.575 m)   Wt 140 lb (63.5 kg)   BMI 25.61 kg/m   Specialty Comments:  No specialty comments available.  PMFS History: Patient Active Problem List   Diagnosis Date Noted  . Rib pain on right side 01/05/2018  . Varicose  veins of leg with complications 07/26/1593  . Chronic venous insufficiency 06/23/2015   Past Medical History:  Diagnosis Date  . Adrenal insufficiency ([place])   . HTN (hypertension)   . L4 vertebral fracture (HCC)    L4-L5 fracture  . Palpitations   . PVC's (premature ventricular contractions)   . Ulcerative colitis     Family History  Problem Relation Age of Onset  . Heart failure Unknown   . Diabetes Father   . Heart disease Father   . Hypertension Father     Past Surgical History:  Procedure Laterality Date  . HAND SURGERY    . NASAL SINUS SURGERY     Social History   Occupational History  . Occupation: Biochemist, clinical  Tobacco Use  . Smoking status: Never Smoker  . Smokeless tobacco: Never Used  Substance and Sexual Activity  .  Alcohol use: No    Alcohol/week: 0.0 standard drinks  . Drug use: No  . Sexual activity: Not on file

## 2018-08-13 DIAGNOSIS — E119 Type 2 diabetes mellitus without complications: Secondary | ICD-10-CM | POA: Diagnosis not present

## 2018-08-13 DIAGNOSIS — Z0000 Encounter for general adult medical examination without abnormal findings: Secondary | ICD-10-CM | POA: Diagnosis not present

## 2018-08-13 DIAGNOSIS — R82998 Other abnormal findings in urine: Secondary | ICD-10-CM | POA: Diagnosis not present

## 2018-08-13 DIAGNOSIS — I10 Essential (primary) hypertension: Secondary | ICD-10-CM | POA: Diagnosis not present

## 2018-08-13 DIAGNOSIS — E559 Vitamin D deficiency, unspecified: Secondary | ICD-10-CM | POA: Diagnosis not present

## 2018-08-18 DIAGNOSIS — Z0000 Encounter for general adult medical examination without abnormal findings: Secondary | ICD-10-CM | POA: Diagnosis not present

## 2018-08-18 DIAGNOSIS — Z1231 Encounter for screening mammogram for malignant neoplasm of breast: Secondary | ICD-10-CM | POA: Diagnosis not present

## 2018-08-18 DIAGNOSIS — I8291 Chronic embolism and thrombosis of unspecified vein: Secondary | ICD-10-CM | POA: Diagnosis not present

## 2018-08-18 DIAGNOSIS — Z86718 Personal history of other venous thrombosis and embolism: Secondary | ICD-10-CM | POA: Diagnosis not present

## 2018-08-18 DIAGNOSIS — K625 Hemorrhage of anus and rectum: Secondary | ICD-10-CM | POA: Diagnosis not present

## 2018-08-18 DIAGNOSIS — D8989 Other specified disorders involving the immune mechanism, not elsewhere classified: Secondary | ICD-10-CM | POA: Diagnosis not present

## 2018-08-18 DIAGNOSIS — Z1389 Encounter for screening for other disorder: Secondary | ICD-10-CM | POA: Diagnosis not present

## 2018-09-22 ENCOUNTER — Ambulatory Visit (INDEPENDENT_AMBULATORY_CARE_PROVIDER_SITE_OTHER): Payer: BLUE CROSS/BLUE SHIELD | Admitting: Family Medicine

## 2018-09-28 DIAGNOSIS — R0602 Shortness of breath: Secondary | ICD-10-CM | POA: Diagnosis not present

## 2018-09-28 DIAGNOSIS — Z6829 Body mass index (BMI) 29.0-29.9, adult: Secondary | ICD-10-CM | POA: Diagnosis not present

## 2018-09-28 DIAGNOSIS — I10 Essential (primary) hypertension: Secondary | ICD-10-CM | POA: Diagnosis not present

## 2018-10-01 ENCOUNTER — Encounter (HOSPITAL_COMMUNITY): Payer: Self-pay | Admitting: Emergency Medicine

## 2018-10-01 ENCOUNTER — Other Ambulatory Visit: Payer: Self-pay

## 2018-10-01 ENCOUNTER — Emergency Department (HOSPITAL_COMMUNITY): Payer: BLUE CROSS/BLUE SHIELD

## 2018-10-01 ENCOUNTER — Emergency Department (HOSPITAL_COMMUNITY)
Admission: EM | Admit: 2018-10-01 | Discharge: 2018-10-01 | Disposition: A | Discharge status: Home / Self Care | Payer: BLUE CROSS/BLUE SHIELD | Attending: Emergency Medicine | Admitting: Emergency Medicine

## 2018-10-01 DIAGNOSIS — I10 Essential (primary) hypertension: Secondary | ICD-10-CM | POA: Insufficient documentation

## 2018-10-01 DIAGNOSIS — K625 Hemorrhage of anus and rectum: Secondary | ICD-10-CM | POA: Diagnosis not present

## 2018-10-01 DIAGNOSIS — D259 Leiomyoma of uterus, unspecified: Secondary | ICD-10-CM | POA: Diagnosis not present

## 2018-10-01 DIAGNOSIS — Z7901 Long term (current) use of anticoagulants: Secondary | ICD-10-CM | POA: Diagnosis not present

## 2018-10-01 DIAGNOSIS — R1084 Generalized abdominal pain: Secondary | ICD-10-CM | POA: Insufficient documentation

## 2018-10-01 DIAGNOSIS — K5730 Diverticulosis of large intestine without perforation or abscess without bleeding: Secondary | ICD-10-CM | POA: Diagnosis not present

## 2018-10-01 LAB — CBC WITH DIFFERENTIAL/PLATELET
Abs Immature Granulocytes: 0.47 10*3/uL — ABNORMAL HIGH (ref 0.00–0.07)
Basophils Absolute: 0.1 10*3/uL (ref 0.0–0.1)
Basophils Relative: 1 %
Eosinophils Absolute: 0 10*3/uL (ref 0.0–0.5)
Eosinophils Relative: 0 %
HCT: 42.8 % (ref 36.0–46.0)
Hemoglobin: 13.3 g/dL (ref 12.0–15.0)
IMMATURE GRANULOCYTES: 4 %
LYMPHS ABS: 0.7 10*3/uL (ref 0.7–4.0)
Lymphocytes Relative: 6 %
MCH: 29 pg (ref 26.0–34.0)
MCHC: 31.1 g/dL (ref 30.0–36.0)
MCV: 93.2 fL (ref 80.0–100.0)
MONOS PCT: 5 %
Monocytes Absolute: 0.7 10*3/uL (ref 0.1–1.0)
Neutro Abs: 10.7 10*3/uL — ABNORMAL HIGH (ref 1.7–7.7)
Neutrophils Relative %: 84 %
Platelets: 254 10*3/uL (ref 150–400)
RBC: 4.59 MIL/uL (ref 3.87–5.11)
RDW: 15 % (ref 11.5–15.5)
WBC: 12.6 10*3/uL — ABNORMAL HIGH (ref 4.0–10.5)
nRBC: 0 % (ref 0.0–0.2)

## 2018-10-01 LAB — COMPREHENSIVE METABOLIC PANEL
ALK PHOS: 55 U/L (ref 38–126)
ALT: 19 U/L (ref 0–44)
AST: 21 U/L (ref 15–41)
Albumin: 3.3 g/dL — ABNORMAL LOW (ref 3.5–5.0)
Anion gap: 9 (ref 5–15)
BUN: 14 mg/dL (ref 8–23)
CALCIUM: 8 mg/dL — AB (ref 8.9–10.3)
CHLORIDE: 109 mmol/L (ref 98–111)
CO2: 21 mmol/L — AB (ref 22–32)
CREATININE: 0.65 mg/dL (ref 0.44–1.00)
Glucose, Bld: 109 mg/dL — ABNORMAL HIGH (ref 70–99)
Potassium: 3.6 mmol/L (ref 3.5–5.1)
SODIUM: 139 mmol/L (ref 135–145)
Total Bilirubin: 0.7 mg/dL (ref 0.3–1.2)
Total Protein: 6.2 g/dL — ABNORMAL LOW (ref 6.5–8.1)

## 2018-10-01 LAB — I-STAT CG4 LACTIC ACID, ED: LACTIC ACID, VENOUS: 1.8 mmol/L (ref 0.5–1.9)

## 2018-10-01 LAB — POC OCCULT BLOOD, ED: FECAL OCCULT BLD: POSITIVE — AB

## 2018-10-01 MED ORDER — SODIUM CHLORIDE 0.9 % IV BOLUS
500.0000 mL | Freq: Once | INTRAVENOUS | Status: AC
  Administered 2018-10-01: 500 mL via INTRAVENOUS

## 2018-10-01 MED ORDER — IOPAMIDOL (ISOVUE-300) INJECTION 61%
100.0000 mL | Freq: Once | INTRAVENOUS | Status: AC | PRN
  Administered 2018-10-01: 100 mL via INTRAVENOUS

## 2018-10-01 NOTE — ED Notes (Signed)
Ambulatory to the bathroom, without difficulty. Patient given discharge instruction, verbalized understand. Patient ambulatory out of the department.

## 2018-10-01 NOTE — Discharge Instructions (Signed)
You were seen in the ED today with bleeding from the rectum. We did not find a clear source but your bleeding for now is minor. Your blood counts are normal. Continue your medications and keep your GI appointment scheduled for tomorrow. Return to the ED with any new or suddenly worsening symptoms.

## 2018-10-01 NOTE — ED Provider Notes (Signed)
Emergency Department Provider Note   I have reviewed the triage vital signs and the nursing notes.   HISTORY  Chief Complaint Rectal Bleeding   HPI ELONI DARIUS is a 63 y.o. female with PMH of adrenal insufficiency from chronic steroid use, HTN, L4 fracture, UC on prednisone, and DVT on Eliquis presents to the emergency department for evaluation of bright red blood per rectum with some pain with bowel movements.  Patient has had 2 to 3 days of symptoms with bleeding worsening progressively over that time.  She is followed by Dr. Cristina Gong with [facility] in [place].  She has had ulcerative colitis for many years and states that this pain is mainly with bowel movements which is unusual.  She is having hard stools which is also not typical of her ulcerative colitis.  She has not had any fevers but possibly some occasional chills over the weekend.  No vomiting or diarrhea.  No recent changes to her medications.  She was recently treated for a lumbar spine fracture and found to have a DVT.  She has been compliant with her Eliquis.  She is not experiencing any lightheadedness, shortness of breath, heart palpitations, or chest pain symptoms.  Past Medical History:  Diagnosis Date  . Adrenal insufficiency ([place])   . HTN (hypertension)   . L4 vertebral fracture ([facility])    L4-L5 fracture  . Palpitations   . PVC's (premature ventricular contractions)   . Ulcerative colitis     Patient Active Problem List   Diagnosis Date Noted  . Rib pain on right side 01/05/2018  . Varicose veins of leg with complications 09/32/6712  . Chronic venous insufficiency 06/23/2015    Past Surgical History:  Procedure Laterality Date  . HAND SURGERY    . NASAL SINUS SURGERY     Allergies Penicillins; Sulfa antibiotics; Morphine and related; and Voltaren [diclofenac]  Family History  Problem Relation Age of Onset  . Heart failure Other   . Diabetes Father   . Heart disease Father   . Hypertension  Father     Social History Social History   Tobacco Use  . Smoking status: Never Smoker  . Smokeless tobacco: Never Used  Substance Use Topics  . Alcohol use: No    Alcohol/week: 0.0 standard drinks  . Drug use: No    Review of Systems  Constitutional: No fever/chills Eyes: No visual changes. ENT: No sore throat. Cardiovascular: Denies chest pain. Respiratory: Denies shortness of breath. Gastrointestinal: Positive lower abdominal/rectal pain.  No nausea, no vomiting.  No diarrhea.  No constipation. Positive blood in the stool.  Genitourinary: Negative for dysuria. Musculoskeletal: Negative for back pain. Skin: Negative for rash. Neurological: Negative for headaches, focal weakness or numbness.  10-point ROS otherwise negative.  ____________________________________________   PHYSICAL EXAM:  VITAL SIGNS: ED Triage Vitals  Enc Vitals Group     BP 10/01/18 1547 (!) 141/81     Pulse Rate 10/01/18 1547 93     Resp 10/01/18 1547 16     Temp 10/01/18 1547 (!) 97.4 F (36.3 C)     Temp Source 10/01/18 1547 Oral     SpO2 10/01/18 1547 97 %     Weight 10/01/18 1548 140 lb (63.5 kg)     Height 10/01/18 1548 5' (1.524 m)     Pain Score 10/01/18 1548 7   Constitutional: Alert and oriented. Well appearing and in no acute distress. Eyes: Conjunctivae are normal.  Head: Atraumatic. Nose: No congestion/rhinnorhea. Mouth/Throat:  Mucous membranes are moist.  Neck: No stridor.  Cardiovascular: Normal rate, regular rhythm. Good peripheral circulation. Grossly normal heart sounds.   Respiratory: Normal respiratory effort.  No retractions. Lungs CTAB. Gastrointestinal: Soft and nontender. No distention. Rectal exam with small, non-engorged external hemorrhoids. No visible bleeding or fissures. Brown stool with maroon colored material. No melena. No palpable internal hemorrhoids.  Musculoskeletal: No lower extremity tenderness nor edema. No gross deformities of  extremities. Neurologic:  Normal speech and language. No gross focal neurologic deficits are appreciated.  Skin:  Skin is warm, dry and intact. No rash noted.  ____________________________________________   LABS (all labs ordered are listed, but only abnormal results are displayed)  Labs Reviewed  COMPREHENSIVE METABOLIC PANEL - Abnormal; Notable for the following components:      Result Value   CO2 21 (*)    Glucose, Bld 109 (*)    Calcium 8.0 (*)    Total Protein 6.2 (*)    Albumin 3.3 (*)    All other components within normal limits  CBC WITH DIFFERENTIAL/PLATELET - Abnormal; Notable for the following components:   WBC 12.6 (*)    Neutro Abs 10.7 (*)    Abs Immature Granulocytes 0.47 (*)    All other components within normal limits  POC OCCULT BLOOD, ED - Abnormal; Notable for the following components:   Fecal Occult Bld POSITIVE (*)    All other components within normal limits  CBC WITH DIFFERENTIAL/PLATELET  I-STAT CG4 LACTIC ACID, ED    ____________________________________________  RADIOLOGY  Ct Abdomen Pelvis W Contrast  Result Date: 10/01/2018 CLINICAL DATA:  Acute generalized abdominal pain, history of ulcerative colitis, hypertension EXAM: CT ABDOMEN AND PELVIS WITH CONTRAST TECHNIQUE: Multidetector CT imaging of the abdomen and pelvis was performed using the standard protocol following bolus administration of intravenous contrast. CONTRAST:  148mL ISOVUE-300 IOPAMIDOL (ISOVUE-300) INJECTION 61% COMPARISON:  04/03/2006 FINDINGS: Lower chest: Normal heart size. No pericardial pleural effusion. Dependent basilar atelectasis versus scarring. Degenerative changes of the lower thoracic spine. Hepatobiliary: No focal liver abnormality is seen. No gallstones, gallbladder wall thickening, or biliary dilatation. Pancreas: Unremarkable. No pancreatic ductal dilatation or surrounding inflammatory changes. Spleen: Normal in size without focal abnormality. Adrenals/Urinary Tract:  Adrenal glands are unremarkable. Kidneys are normal, without renal calculi, focal lesion, or hydronephrosis. Bladder is unremarkable. Stomach/Bowel: Negative for bowel obstruction, significant dilatation, ileus, free air. Normal appendix demonstrated. Scattered colonic diverticulosis. No acute inflammatory process or bowel wall thickening. No fluid collection, abscess, ascites, hemorrhage or hematoma. Vascular/Lymphatic: Aortoiliac tortuosity without significant atherosclerosis or occlusive process. Mesenteric and renal vasculature appear patent. No veno-occlusive process. No adenopathy. Reproductive: Small uterine fibroids noted some with calcification. No adnexal abnormality. No pelvic free fluid or ascites. No pelvic hemorrhage or hematoma. Other: Small fat containing umbilical hernia. Otherwise abdominal wall intact. No ascites. Musculoskeletal: Degenerative changes noted spine. Mild associated scoliosis. Chronic appearing compression fractures at L5 and L4. Prominent T12 Schmorl's node noted. IMPRESSION: No acute intra-abdominopelvic finding by CT. Colonic diverticulosis without acute inflammatory process. Small uterine fibroids some with calcification Small fat containing umbilical hernia Electronically Signed   By: Jerilynn Mages.  Shick M.D.   On: 10/01/2018 18:33    ____________________________________________   PROCEDURES  Procedure(s) performed:   Procedures  None ____________________________________________   INITIAL IMPRESSION / ASSESSMENT AND PLAN / ED COURSE  Pertinent labs & imaging results that were available during my care of the patient were reviewed by me and considered in my medical decision making (see chart for details).  Patient presents to the emergency department with 2 days of rectal bleeding that is steadily worsening.  She has ulcerative colitis.  No visible external cause for her symptoms.  She does have some tenderness on digital rectal exam.  Question ulcerative colitis flare  in the rectum.  Plan for screening labs and CT. Patient is on Eliquis but bleeding is relatively small-volume and patient remains hemodynamically stable.   Trace positive hemoccult testing (two punctate areas turning blue).   06:40 PM CT scan reviewed with no acute findings.  Specifically no evidence of ulcerative colitis flare.  The patient's hemoglobin is 13.  She did have one episode of mild hypoxemia while asleep which improved when awake.  Patient has a gastroenterology appointment scheduled for tomorrow afternoon.  I believe that she is safe to be discharged at this time for that appointment.  Discussed ED return precautions in detail. ____________________________________________  FINAL CLINICAL IMPRESSION(S) / ED DIAGNOSES  Final diagnoses:  Rectal bleeding    MEDICATIONS GIVEN DURING THIS VISIT:  Medications  sodium chloride 0.9 % bolus 500 mL (500 mLs Intravenous New Bag/Given 10/01/18 1651)  iopamidol (ISOVUE-300) 61 % injection 100 mL (100 mLs Intravenous Contrast Given 10/01/18 1741)    Note:  This document was prepared using Dragon voice recognition software and may include unintentional dictation errors.  Nanda Quinton, MD Emergency Medicine    Donie Moulton, [facility], MD 10/01/18 [id number]

## 2018-10-01 NOTE — ED Triage Notes (Signed)
Pt states that she has uc and she started having rectal bleeding she is also having trouble with her bp also

## 2018-10-01 NOTE — ED Notes (Signed)
Cbc clotted, labs needs new order to redraw

## 2018-10-12 DIAGNOSIS — R42 Dizziness and giddiness: Secondary | ICD-10-CM | POA: Diagnosis not present

## 2018-10-12 DIAGNOSIS — I10 Essential (primary) hypertension: Secondary | ICD-10-CM | POA: Diagnosis not present

## 2018-10-12 DIAGNOSIS — Z8679 Personal history of other diseases of the circulatory system: Secondary | ICD-10-CM | POA: Diagnosis not present

## 2018-10-12 DIAGNOSIS — I493 Ventricular premature depolarization: Secondary | ICD-10-CM | POA: Diagnosis not present

## 2018-10-19 DIAGNOSIS — I10 Essential (primary) hypertension: Secondary | ICD-10-CM | POA: Diagnosis not present

## 2018-10-23 DIAGNOSIS — Z6829 Body mass index (BMI) 29.0-29.9, adult: Secondary | ICD-10-CM | POA: Diagnosis not present

## 2018-10-23 DIAGNOSIS — I10 Essential (primary) hypertension: Secondary | ICD-10-CM | POA: Diagnosis not present

## 2018-10-23 DIAGNOSIS — M25551 Pain in right hip: Secondary | ICD-10-CM | POA: Diagnosis not present

## 2018-10-28 ENCOUNTER — Ambulatory Visit (INDEPENDENT_AMBULATORY_CARE_PROVIDER_SITE_OTHER): Payer: Self-pay

## 2018-10-28 ENCOUNTER — Ambulatory Visit (INDEPENDENT_AMBULATORY_CARE_PROVIDER_SITE_OTHER): Payer: BLUE CROSS/BLUE SHIELD | Admitting: Family Medicine

## 2018-10-28 ENCOUNTER — Encounter (INDEPENDENT_AMBULATORY_CARE_PROVIDER_SITE_OTHER): Payer: Self-pay | Admitting: Family Medicine

## 2018-10-28 DIAGNOSIS — M25551 Pain in right hip: Secondary | ICD-10-CM | POA: Diagnosis not present

## 2018-10-28 MED ORDER — LIDOCAINE HCL 1 % IJ SOLN
5.0000 mL | INTRAMUSCULAR | Status: AC | PRN
  Administered 2018-10-28: 5 mL

## 2018-10-28 MED ORDER — KETOROLAC TROMETHAMINE 30 MG/ML IJ SOLN: 30.0000 mg | Freq: Once | INTRAMUSCULAR | Status: AC
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
## 2018-10-30 ENCOUNTER — Telehealth (INDEPENDENT_AMBULATORY_CARE_PROVIDER_SITE_OTHER): Payer: Self-pay

## 2018-10-30 ENCOUNTER — Telehealth (INDEPENDENT_AMBULATORY_CARE_PROVIDER_SITE_OTHER): Payer: Self-pay | Admitting: Family Medicine

## 2018-10-30 NOTE — Telephone Encounter (Signed)
I called: she said the injection in the hip on 2/05 has not really kicked in yet. I usually does take a few days before you feel relief.  She said she will give it the weekend with rest and ice off and on and see if that helps. I advised her that Dr. Junius Roads' had said that the next step would be PT, if the injection does not help. She is already doing water therapy. The patient will call us back on Monday 2/10 with an update on her condition.

## 2018-10-30 NOTE — Telephone Encounter (Signed)
Patient called wanting to know how long does it take for an injection to start working?  Stated that she is in terrible pain.  Cb# is [phone].  Please advise.  Thank you.

## 2018-10-30 NOTE — Telephone Encounter (Signed)
I called. See other message on this from today.

## 2018-10-30 NOTE — Telephone Encounter (Signed)
Pt called in said she came in and got an Anti inflammatory injection but shes still in terrible pain and happens to be in a wheelchair now due to the pain.

## 2018-11-02 ENCOUNTER — Ambulatory Visit (INDEPENDENT_AMBULATORY_CARE_PROVIDER_SITE_OTHER): Payer: BLUE CROSS/BLUE SHIELD | Admitting: Family Medicine

## 2018-11-02 ENCOUNTER — Encounter (INDEPENDENT_AMBULATORY_CARE_PROVIDER_SITE_OTHER): Payer: Self-pay | Admitting: Family Medicine

## 2018-11-02 VITALS — BP 131/76 | HR 74

## 2018-11-02 DIAGNOSIS — M25551 Pain in right hip: Secondary | ICD-10-CM

## 2018-11-02 DIAGNOSIS — R1030 Lower abdominal pain, unspecified: Secondary | ICD-10-CM | POA: Diagnosis not present

## 2018-11-02 NOTE — Progress Notes (Signed)
   Office Visit Note   Patient: Betty Bryan           Date of Birth: [birth year]           MRN: [id number] Visit Date: 11/02/2018 Requested by: Crist Infante, MD [address] [place], [place] [address] PCP: Crist Infante, MD  Subjective: Chief Complaint  Patient presents with  . Follow-up    Rt groin--is gotten worse    HPI: She is here with persistent right groin pain.  Toradol injection did not give much relief.  She has pain mainly when swinging her leg forward while walking.  It does not bother her much at rest.              ROS: Otherwise noncontributory  Objective: Vital Signs: BP 131/76   Pulse 74   SpO2 99%   Physical Exam:  Right hip: She has good range of motion and minimal pain with passive flexion and internal/external rotation.  She is tender along the adductor muscle belly.  She has some pain with adduction against resistance.  No pain with hip flexion or knee extension.  Imaging: None today.  Assessment & Plan: 1.  Worsening right groin pain, possible muscular strain.  Cannot rule out occult fracture. -MRI to evaluate.  PT if no need for surgery.   Follow-Up Instructions: No follow-ups on file.      Procedures: No procedures performed  No notes on file    PMFS History: Patient Active Problem List   Diagnosis Date Noted  . Rib pain on right side 01/05/2018  . Varicose veins of leg with complications 94/50/3888  . Chronic venous insufficiency 06/23/2015   Past Medical History:  Diagnosis Date  . Adrenal insufficiency ([place])   . HTN (hypertension)   . L4 vertebral fracture ([facility])    L4-L5 fracture  . Palpitations   . PVC's (premature ventricular contractions)   . Ulcerative colitis     Family History  Problem Relation Age of Onset  . Heart failure Other   . Diabetes Father   . Heart disease Father   . Hypertension Father     Past Surgical History:  Procedure Laterality Date  . HAND SURGERY    . NASAL SINUS SURGERY     Social  History   Occupational History  . Occupation: Biochemist, clinical  Tobacco Use  . Smoking status: Never Smoker  . Smokeless tobacco: Never Used  Substance and Sexual Activity  . Alcohol use: No    Alcohol/week: 0.0 standard drinks  . Drug use: No  . Sexual activity: Not on file

## 2018-11-04 ENCOUNTER — Ambulatory Visit
Admission: RE | Admit: 2018-11-04 | Discharge: 2018-11-04 | Disposition: A | Discharge status: Home / Self Care | Payer: BLUE CROSS/BLUE SHIELD | Source: Ambulatory Visit | Attending: Family Medicine | Admitting: Family Medicine

## 2018-11-04 ENCOUNTER — Telehealth (INDEPENDENT_AMBULATORY_CARE_PROVIDER_SITE_OTHER): Payer: Self-pay | Admitting: Family Medicine

## 2018-11-04 DIAGNOSIS — M25551 Pain in right hip: Secondary | ICD-10-CM

## 2018-11-04 NOTE — Telephone Encounter (Signed)
Pt will come in to see Dr. Erlinda Hong to discuss treatment options for gluteus minimus and medius tendon tears.

## 2018-11-05 ENCOUNTER — Telehealth (INDEPENDENT_AMBULATORY_CARE_PROVIDER_SITE_OTHER): Payer: Self-pay | Admitting: Family Medicine

## 2018-11-05 NOTE — Telephone Encounter (Signed)
Ok to keep using wheelchair/scooter until seen.

## 2018-11-05 NOTE — Telephone Encounter (Signed)
Patient advised.

## 2018-11-05 NOTE — Telephone Encounter (Signed)
I called and gave her the results. She will be seeing Dr. Erlinda Hong on 11/10/2018.  She wants to know if she should continue to try to keep weight off her right hip. Currently, she is using a wheelchair or a motorized scooter. The pain is worse if she moves around too much or tries to do much weightbearing. Please advise.

## 2018-11-05 NOTE — Telephone Encounter (Signed)
Patient called to see if Dr. Junius Roads would give her the results of her MRI over the phone.  CB#[phone]

## 2018-11-10 ENCOUNTER — Ambulatory Visit (INDEPENDENT_AMBULATORY_CARE_PROVIDER_SITE_OTHER): Payer: BLUE CROSS/BLUE SHIELD | Admitting: Orthopaedic Surgery

## 2018-11-10 ENCOUNTER — Encounter (INDEPENDENT_AMBULATORY_CARE_PROVIDER_SITE_OTHER): Payer: Self-pay | Admitting: Orthopaedic Surgery

## 2018-11-10 DIAGNOSIS — M25551 Pain in right hip: Secondary | ICD-10-CM | POA: Diagnosis not present

## 2018-11-10 MED ORDER — CALCIUM CARBONATE-VITAMIN D 500-200 MG-UNIT PO TABS: 1.0000 | ORAL_TABLET | Freq: Three times a day (TID) | ORAL | 12 refills | Status: DC

## 2018-11-10 MED ORDER — ZINC SULFATE 220 (50 ZN) MG PO CAPS: 220.0000 mg | ORAL_CAPSULE | Freq: Every day | ORAL | 0 refills | Status: DC

## 2018-11-10 NOTE — Progress Notes (Signed)
Office Visit Note   Patient: Betty Bryan           Date of Birth: 1956-05-30           MRN: [id number] Visit Date: 11/10/2018              Requested by: Betty Infante, MD [address] [place], [place] [address] PCP: Betty Infante, MD   Assessment & Plan: Visit Diagnoses:  1. Pain of right hip joint     Plan: I reviewed the MRI scan and the results with the patient.  Fortunately she does not appear to be symptomatic from her abductor tear.  She is more symptomatic from the pelvic fractures as well as the abductor strain.  We will treat this symptomatically.  I have given her prescription for calcium and vitamin D and zinc sulfate to help promote bony healing.  I would recommend increasing activity as tolerated.  She may benefit from a course of outpatient physical therapy once she is weightbearing better and with less pain.  We will have the patient follow-up in about 6 weeks with Dr. Junius Bryan.  Patient is in agreement with the plan. Total face to face encounter time was greater than 25 minutes and over half of this time was spent in counseling and/or coordination of care.  Follow-Up Instructions: Return for f/u with hilts in 6 weeks.   Orders:  No orders of the defined types were placed in this encounter.  Meds ordered this encounter  Medications  . DISCONTD: calcium-vitamin D (OSCAL WITH D) 500-200 MG-UNIT tablet    Sig: Take 1 tablet by mouth 3 (three) times daily.    Dispense:  90 tablet    Refill:  12  . DISCONTD: zinc sulfate 220 (50 Zn) MG capsule    Sig: Take 1 capsule (220 mg total) by mouth daily.    Dispense:  42 capsule    Refill:  0  . calcium-vitamin D (OSCAL WITH D) 500-200 MG-UNIT tablet    Sig: Take 1 tablet by mouth 3 (three) times daily.    Dispense:  90 tablet    Refill:  12  . zinc sulfate 220 (50 Zn) MG capsule    Sig: Take 1 capsule (220 mg total) by mouth daily.    Dispense:  42 capsule    Refill:  0      Procedures: No procedures  performed   Clinical Data: No additional findings.   Subjective: Chief Complaint  Patient presents with  . Right Hip - Pain    Betty Bryan is 63 year old female who is referred to me by Dr. Junius Bryan for right hip pain.  She recently had an MRI of her right hip which showed a nondisplaced right superior and inferior rami fractures as well as a full-thickness tear of her abductor insertion.  She has had significant pain with ambulation and weight-bearing.  She reports severe pain in the groin region.  She denies any pain in her lateral hip.   Review of Systems  Constitutional: Negative.   HENT: Negative.   Eyes: Negative.   Respiratory: Negative.   Cardiovascular: Negative.   Endocrine: Negative.   Musculoskeletal: Negative.   Neurological: Negative.   Hematological: Negative.   Psychiatric/Behavioral: Negative.   All other systems reviewed and are negative.    Objective: Vital Signs: There were no vitals taken for this visit.  Physical Exam Vitals signs and nursing note reviewed.  Constitutional:      Appearance: She is well-developed.  Pulmonary:  Effort: Pulmonary effort is normal.  Skin:    General: Skin is warm.     Capillary Refill: Capillary refill takes less than 2 seconds.  Neurological:     Mental Status: She is alert and oriented to person, place, and time.  Psychiatric:        Behavior: Behavior normal.        Thought Content: Thought content normal.        Judgment: Judgment normal.     Ortho Exam Right hip exam shows no tenderness of the greater trochanter or in the abductor region.  She has mild weakness and pain with resisted hip abduction.  Her pain is localized to the groin region with weightbearing. Specialty Comments:  No specialty comments available.  Imaging: No results found.   PMFS History: Patient Active Problem List   Diagnosis Date Noted  . Rib pain on right side 01/05/2018  . Varicose veins of leg with complications 16/06/9603  .  Chronic venous insufficiency 06/23/2015   Past Medical History:  Diagnosis Date  . Adrenal insufficiency ([place])   . HTN (hypertension)   . L4 vertebral fracture ([facility])    L4-L5 fracture  . Palpitations   . PVC's (premature ventricular contractions)   . Ulcerative colitis     Family History  Problem Relation Age of Onset  . Heart failure Other   . Diabetes Father   . Heart disease Father   . Hypertension Father     Past Surgical History:  Procedure Laterality Date  . HAND SURGERY    . NASAL SINUS SURGERY     Social History   Occupational History  . Occupation: Biochemist, clinical  Tobacco Use  . Smoking status: Never Smoker  . Smokeless tobacco: Never Used  Substance and Sexual Activity  . Alcohol use: No    Alcohol/week: 0.0 standard drinks  . Drug use: No  . Sexual activity: Not on file

## 2018-11-19 ENCOUNTER — Ambulatory Visit: Payer: Self-pay | Admitting: Cardiology

## 2018-11-19 DIAGNOSIS — J181 Lobar pneumonia, unspecified organism: Secondary | ICD-10-CM | POA: Diagnosis not present

## 2018-11-19 DIAGNOSIS — I87319 Chronic venous hypertension (idiopathic) with ulcer of unspecified lower extremity: Secondary | ICD-10-CM | POA: Diagnosis not present

## 2018-11-19 DIAGNOSIS — R600 Localized edema: Secondary | ICD-10-CM | POA: Diagnosis not present

## 2018-11-19 DIAGNOSIS — Z6830 Body mass index (BMI) 30.0-30.9, adult: Secondary | ICD-10-CM | POA: Diagnosis not present

## 2018-11-19 DIAGNOSIS — R05 Cough: Secondary | ICD-10-CM | POA: Diagnosis not present

## 2018-11-25 ENCOUNTER — Encounter (HOSPITAL_COMMUNITY): Payer: Self-pay | Admitting: Emergency Medicine

## 2018-11-25 ENCOUNTER — Emergency Department (HOSPITAL_COMMUNITY): Payer: BLUE CROSS/BLUE SHIELD

## 2018-11-25 ENCOUNTER — Emergency Department (HOSPITAL_COMMUNITY)
Admission: EM | Admit: 2018-11-25 | Discharge: 2018-11-25 | Disposition: A | Discharge status: Home / Self Care | Payer: BLUE CROSS/BLUE SHIELD | Attending: Emergency Medicine | Admitting: Emergency Medicine

## 2018-11-25 ENCOUNTER — Other Ambulatory Visit: Payer: Self-pay

## 2018-11-25 DIAGNOSIS — K5730 Diverticulosis of large intestine without perforation or abscess without bleeding: Secondary | ICD-10-CM | POA: Diagnosis not present

## 2018-11-25 DIAGNOSIS — S22070A Wedge compression fracture of T9-T10 vertebra, initial encounter for closed fracture: Secondary | ICD-10-CM | POA: Diagnosis not present

## 2018-11-25 DIAGNOSIS — R0789 Other chest pain: Secondary | ICD-10-CM | POA: Diagnosis not present

## 2018-11-25 DIAGNOSIS — S22000A Wedge compression fracture of unspecified thoracic vertebra, initial encounter for closed fracture: Secondary | ICD-10-CM

## 2018-11-25 DIAGNOSIS — Y9210 Unspecified residential institution as the place of occurrence of the external cause: Secondary | ICD-10-CM | POA: Insufficient documentation

## 2018-11-25 DIAGNOSIS — I10 Essential (primary) hypertension: Secondary | ICD-10-CM | POA: Diagnosis not present

## 2018-11-25 DIAGNOSIS — Y999 Unspecified external cause status: Secondary | ICD-10-CM | POA: Insufficient documentation

## 2018-11-25 DIAGNOSIS — Y939 Activity, unspecified: Secondary | ICD-10-CM | POA: Diagnosis not present

## 2018-11-25 DIAGNOSIS — R531 Weakness: Secondary | ICD-10-CM

## 2018-11-25 DIAGNOSIS — R109 Unspecified abdominal pain: Secondary | ICD-10-CM | POA: Insufficient documentation

## 2018-11-25 DIAGNOSIS — R52 Pain, unspecified: Secondary | ICD-10-CM | POA: Diagnosis not present

## 2018-11-25 DIAGNOSIS — J189 Pneumonia, unspecified organism: Secondary | ICD-10-CM | POA: Diagnosis not present

## 2018-11-25 DIAGNOSIS — S2242XD Multiple fractures of ribs, left side, subsequent encounter for fracture with routine healing: Secondary | ICD-10-CM | POA: Diagnosis not present

## 2018-11-25 DIAGNOSIS — R1084 Generalized abdominal pain: Secondary | ICD-10-CM | POA: Diagnosis not present

## 2018-11-25 LAB — URINALYSIS, ROUTINE W REFLEX MICROSCOPIC
Bilirubin Urine: NEGATIVE
Glucose, UA: NEGATIVE mg/dL
Hgb urine dipstick: NEGATIVE
KETONES UR: NEGATIVE mg/dL
Leukocytes,Ua: NEGATIVE
Nitrite: NEGATIVE
PROTEIN: NEGATIVE mg/dL
Specific Gravity, Urine: 1.006 (ref 1.005–1.030)
pH: 6 (ref 5.0–8.0)

## 2018-11-25 LAB — COMPREHENSIVE METABOLIC PANEL
ALBUMIN: 3.2 g/dL — AB (ref 3.5–5.0)
ALT: 21 U/L (ref 0–44)
AST: 20 U/L (ref 15–41)
Alkaline Phosphatase: 120 U/L (ref 38–126)
Anion gap: 9 (ref 5–15)
BUN: 18 mg/dL (ref 8–23)
CO2: 24 mmol/L (ref 22–32)
Calcium: 8.6 mg/dL — ABNORMAL LOW (ref 8.9–10.3)
Chloride: 106 mmol/L (ref 98–111)
Creatinine, Ser: 0.69 mg/dL (ref 0.44–1.00)
GFR calc Af Amer: >60 mL/min (ref >60)
GFR calc non Af Amer: >60 mL/min (ref >60)
Glucose, Bld: 125 mg/dL — ABNORMAL HIGH (ref 70–99)
Potassium: 3.6 mmol/L (ref 3.5–5.1)
Sodium: 139 mmol/L (ref 135–145)
Total Bilirubin: 0.4 mg/dL (ref 0.3–1.2)
Total Protein: 5.9 g/dL — ABNORMAL LOW (ref 6.5–8.1)

## 2018-11-25 LAB — TROPONIN I: Troponin I: <0.03 ng/mL (ref <0.03)

## 2018-11-25 LAB — BRAIN NATRIURETIC PEPTIDE: B Natriuretic Peptide: 62 pg/mL (ref 0.0–100.0)

## 2018-11-25 LAB — CBC WITH DIFFERENTIAL/PLATELET
Abs Immature Granulocytes: 0.89 10*3/uL — ABNORMAL HIGH (ref 0.00–0.07)
BASOS ABS: 0.1 10*3/uL (ref 0.0–0.1)
Basophils Relative: 1 %
Eosinophils Absolute: 0 10*3/uL (ref 0.0–0.5)
Eosinophils Relative: 0 %
HCT: 43.6 % (ref 36.0–46.0)
Hemoglobin: 13.3 g/dL (ref 12.0–15.0)
Immature Granulocytes: 6 %
Lymphocytes Relative: 4 %
Lymphs Abs: 0.5 10*3/uL — ABNORMAL LOW (ref 0.7–4.0)
MCH: 28.5 pg (ref 26.0–34.0)
MCHC: 30.5 g/dL (ref 30.0–36.0)
MCV: 93.6 fL (ref 80.0–100.0)
Monocytes Absolute: 0.7 10*3/uL (ref 0.1–1.0)
Monocytes Relative: 5 %
Neutro Abs: 12 10*3/uL — ABNORMAL HIGH (ref 1.7–7.7)
Neutrophils Relative %: 84 %
Platelets: 217 10*3/uL (ref 150–400)
RBC: 4.66 MIL/uL (ref 3.87–5.11)
RDW: 15.4 % (ref 11.5–15.5)
WBC: 14.2 10*3/uL — ABNORMAL HIGH (ref 4.0–10.5)
nRBC: 0 % (ref 0.0–0.2)

## 2018-11-25 LAB — LIPASE, BLOOD: Lipase: 45 U/L (ref 11–51)

## 2018-11-25 MED ORDER — IOHEXOL 300 MG/ML  SOLN
100.0000 mL | Freq: Once | INTRAMUSCULAR | Status: AC | PRN
  Administered 2018-11-25: 100 mL via INTRAVENOUS

## 2018-11-25 MED ORDER — SODIUM CHLORIDE 0.9 % IV BOLUS
500.0000 mL | Freq: Once | INTRAVENOUS | Status: AC
  Administered 2018-11-25: 500 mL via INTRAVENOUS

## 2018-11-25 MED ORDER — LIDOCAINE 5 % EX OINT: 1.0000 "application " | TOPICAL_OINTMENT | Freq: Three times a day (TID) | CUTANEOUS | 0 refills | Status: DC | PRN
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
## 2018-11-25 NOTE — Discharge Instructions (Signed)
Your workup today showed that you have a fracture to one or more ribs.  Unfortunately this type of injury hurts but there is no way to fix it immediately; it must heal over time.  Be sure to take plenty of deep breaths so that you get rid of the "bad air" in your lungs.  If you are given a device called an incentive spirometer, please use it as recommended.  You can also take Tylenol 1000 mg every 6 hours as needed for pain.  Follow-up at the clinics or with the doctors described in this paperwork.  Return to the emergency department if he develop new or worsening symptoms that concern you.

## 2018-11-25 NOTE — ED Notes (Signed)
Patient transported to CT 
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
## 2018-11-27 LAB — URINE CULTURE
Culture: NO GROWTH
Special Requests: NORMAL

## 2018-12-02 DIAGNOSIS — S22070A Wedge compression fracture of T9-T10 vertebra, initial encounter for closed fracture: Secondary | ICD-10-CM | POA: Diagnosis not present

## 2018-12-02 DIAGNOSIS — J9811 Atelectasis: Secondary | ICD-10-CM | POA: Diagnosis not present

## 2018-12-02 DIAGNOSIS — S2249XA Multiple fractures of ribs, unspecified side, initial encounter for closed fracture: Secondary | ICD-10-CM | POA: Diagnosis not present

## 2018-12-02 DIAGNOSIS — S32049A Unspecified fracture of fourth lumbar vertebra, initial encounter for closed fracture: Secondary | ICD-10-CM | POA: Diagnosis not present

## 2018-12-03 ENCOUNTER — Other Ambulatory Visit: Payer: Self-pay | Admitting: Internal Medicine

## 2018-12-03 DIAGNOSIS — M810 Age-related osteoporosis without current pathological fracture: Secondary | ICD-10-CM

## 2018-12-03 DIAGNOSIS — M5416 Radiculopathy, lumbar region: Secondary | ICD-10-CM

## 2018-12-03 DIAGNOSIS — S32049A Unspecified fracture of fourth lumbar vertebra, initial encounter for closed fracture: Secondary | ICD-10-CM

## 2018-12-03 DIAGNOSIS — S22070A Wedge compression fracture of T9-T10 vertebra, initial encounter for closed fracture: Secondary | ICD-10-CM

## 2018-12-06 ENCOUNTER — Other Ambulatory Visit: Payer: Self-pay

## 2018-12-06 ENCOUNTER — Ambulatory Visit
Admission: RE | Admit: 2018-12-06 | Discharge: 2018-12-06 | Disposition: A | Discharge status: Home / Self Care | Payer: BLUE CROSS/BLUE SHIELD | Source: Ambulatory Visit | Attending: Internal Medicine | Admitting: Internal Medicine

## 2018-12-06 DIAGNOSIS — M546 Pain in thoracic spine: Secondary | ICD-10-CM | POA: Diagnosis not present

## 2018-12-06 DIAGNOSIS — M5416 Radiculopathy, lumbar region: Secondary | ICD-10-CM

## 2018-12-06 DIAGNOSIS — S22070A Wedge compression fracture of T9-T10 vertebra, initial encounter for closed fracture: Secondary | ICD-10-CM

## 2018-12-06 DIAGNOSIS — M545 Low back pain: Secondary | ICD-10-CM | POA: Diagnosis not present

## 2018-12-06 DIAGNOSIS — M810 Age-related osteoporosis without current pathological fracture: Secondary | ICD-10-CM

## 2018-12-06 DIAGNOSIS — S32049A Unspecified fracture of fourth lumbar vertebra, initial encounter for closed fracture: Secondary | ICD-10-CM

## 2018-12-08 ENCOUNTER — Ambulatory Visit: Payer: Self-pay | Admitting: Cardiology

## 2018-12-08 DIAGNOSIS — S22070A Wedge compression fracture of T9-T10 vertebra, initial encounter for closed fracture: Secondary | ICD-10-CM | POA: Diagnosis not present

## 2018-12-14 ENCOUNTER — Other Ambulatory Visit: Payer: Self-pay | Admitting: Neurosurgery

## 2018-12-22 ENCOUNTER — Encounter (HOSPITAL_COMMUNITY): Payer: Self-pay | Admitting: Emergency Medicine

## 2018-12-22 ENCOUNTER — Emergency Department (HOSPITAL_COMMUNITY): Payer: BLUE CROSS/BLUE SHIELD

## 2018-12-22 ENCOUNTER — Ambulatory Visit (INDEPENDENT_AMBULATORY_CARE_PROVIDER_SITE_OTHER): Payer: BLUE CROSS/BLUE SHIELD | Admitting: Family Medicine

## 2018-12-22 ENCOUNTER — Inpatient Hospital Stay (HOSPITAL_COMMUNITY)
Admission: EM | Admit: 2018-12-22 | Discharge: 2018-12-31 | DRG: 177 | Disposition: A | Discharge status: Skilled Nursing Facility | Payer: BLUE CROSS/BLUE SHIELD | Attending: Internal Medicine | Admitting: Internal Medicine

## 2018-12-22 ENCOUNTER — Other Ambulatory Visit: Payer: Self-pay

## 2018-12-22 DIAGNOSIS — Z79891 Long term (current) use of opiate analgesic: Secondary | ICD-10-CM

## 2018-12-22 DIAGNOSIS — S2241XA Multiple fractures of ribs, right side, initial encounter for closed fracture: Secondary | ICD-10-CM | POA: Diagnosis present

## 2018-12-22 DIAGNOSIS — Z7951 Long term (current) use of inhaled steroids: Secondary | ICD-10-CM

## 2018-12-22 DIAGNOSIS — R52 Pain, unspecified: Secondary | ICD-10-CM | POA: Diagnosis not present

## 2018-12-22 DIAGNOSIS — S2243XD Multiple fractures of ribs, bilateral, subsequent encounter for fracture with routine healing: Secondary | ICD-10-CM | POA: Diagnosis not present

## 2018-12-22 DIAGNOSIS — Z880 Allergy status to penicillin: Secondary | ICD-10-CM

## 2018-12-22 DIAGNOSIS — Z86718 Personal history of other venous thrombosis and embolism: Secondary | ICD-10-CM

## 2018-12-22 DIAGNOSIS — R0902 Hypoxemia: Secondary | ICD-10-CM | POA: Diagnosis not present

## 2018-12-22 DIAGNOSIS — S32010A Wedge compression fracture of first lumbar vertebra, initial encounter for closed fracture: Secondary | ICD-10-CM | POA: Diagnosis not present

## 2018-12-22 DIAGNOSIS — J449 Chronic obstructive pulmonary disease, unspecified: Secondary | ICD-10-CM | POA: Diagnosis not present

## 2018-12-22 DIAGNOSIS — M810 Age-related osteoporosis without current pathological fracture: Secondary | ICD-10-CM | POA: Diagnosis present

## 2018-12-22 DIAGNOSIS — I480 Paroxysmal atrial fibrillation: Secondary | ICD-10-CM | POA: Diagnosis present

## 2018-12-22 DIAGNOSIS — R41841 Cognitive communication deficit: Secondary | ICD-10-CM | POA: Diagnosis not present

## 2018-12-22 DIAGNOSIS — I482 Chronic atrial fibrillation, unspecified: Secondary | ICD-10-CM | POA: Diagnosis not present

## 2018-12-22 DIAGNOSIS — E2740 Unspecified adrenocortical insufficiency: Secondary | ICD-10-CM | POA: Diagnosis present

## 2018-12-22 DIAGNOSIS — J849 Interstitial pulmonary disease, unspecified: Secondary | ICD-10-CM | POA: Diagnosis not present

## 2018-12-22 DIAGNOSIS — M549 Dorsalgia, unspecified: Secondary | ICD-10-CM | POA: Diagnosis present

## 2018-12-22 DIAGNOSIS — K5190 Ulcerative colitis, unspecified, without complications: Secondary | ICD-10-CM | POA: Diagnosis not present

## 2018-12-22 DIAGNOSIS — Z7952 Long term (current) use of systemic steroids: Secondary | ICD-10-CM

## 2018-12-22 DIAGNOSIS — R0602 Shortness of breath: Secondary | ICD-10-CM | POA: Diagnosis not present

## 2018-12-22 DIAGNOSIS — M25519 Pain in unspecified shoulder: Secondary | ICD-10-CM | POA: Diagnosis not present

## 2018-12-22 DIAGNOSIS — S42034D Nondisplaced fracture of lateral end of right clavicle, subsequent encounter for fracture with routine healing: Secondary | ICD-10-CM | POA: Diagnosis not present

## 2018-12-22 DIAGNOSIS — S22070D Wedge compression fracture of T9-T10 vertebra, subsequent encounter for fracture with routine healing: Secondary | ICD-10-CM | POA: Diagnosis not present

## 2018-12-22 DIAGNOSIS — S42031A Displaced fracture of lateral end of right clavicle, initial encounter for closed fracture: Secondary | ICD-10-CM | POA: Diagnosis not present

## 2018-12-22 DIAGNOSIS — S42001D Fracture of unspecified part of right clavicle, subsequent encounter for fracture with routine healing: Secondary | ICD-10-CM | POA: Diagnosis not present

## 2018-12-22 DIAGNOSIS — Z8249 Family history of ischemic heart disease and other diseases of the circulatory system: Secondary | ICD-10-CM | POA: Diagnosis not present

## 2018-12-22 DIAGNOSIS — Z7901 Long term (current) use of anticoagulants: Secondary | ICD-10-CM

## 2018-12-22 DIAGNOSIS — Z79899 Other long term (current) drug therapy: Secondary | ICD-10-CM | POA: Diagnosis not present

## 2018-12-22 DIAGNOSIS — I10 Essential (primary) hypertension: Secondary | ICD-10-CM | POA: Diagnosis present

## 2018-12-22 DIAGNOSIS — S42001A Fracture of unspecified part of right clavicle, initial encounter for closed fracture: Secondary | ICD-10-CM | POA: Diagnosis not present

## 2018-12-22 DIAGNOSIS — S42034A Nondisplaced fracture of lateral end of right clavicle, initial encounter for closed fracture: Secondary | ICD-10-CM | POA: Diagnosis not present

## 2018-12-22 DIAGNOSIS — S0990XA Unspecified injury of head, initial encounter: Secondary | ICD-10-CM | POA: Diagnosis not present

## 2018-12-22 DIAGNOSIS — E876 Hypokalemia: Secondary | ICD-10-CM | POA: Diagnosis present

## 2018-12-22 DIAGNOSIS — J189 Pneumonia, unspecified organism: Secondary | ICD-10-CM | POA: Diagnosis not present

## 2018-12-22 DIAGNOSIS — Z833 Family history of diabetes mellitus: Secondary | ICD-10-CM

## 2018-12-22 DIAGNOSIS — Z9181 History of falling: Secondary | ICD-10-CM | POA: Diagnosis not present

## 2018-12-22 DIAGNOSIS — S42009A Fracture of unspecified part of unspecified clavicle, initial encounter for closed fracture: Secondary | ICD-10-CM | POA: Diagnosis present

## 2018-12-22 DIAGNOSIS — S199XXA Unspecified injury of neck, initial encounter: Secondary | ICD-10-CM | POA: Diagnosis not present

## 2018-12-22 DIAGNOSIS — E873 Alkalosis: Secondary | ICD-10-CM | POA: Diagnosis present

## 2018-12-22 DIAGNOSIS — Y92009 Unspecified place in unspecified non-institutional (private) residence as the place of occurrence of the external cause: Secondary | ICD-10-CM | POA: Diagnosis not present

## 2018-12-22 DIAGNOSIS — R064 Hyperventilation: Secondary | ICD-10-CM | POA: Diagnosis not present

## 2018-12-22 DIAGNOSIS — W050XXA Fall from non-moving wheelchair, initial encounter: Secondary | ICD-10-CM | POA: Diagnosis present

## 2018-12-22 DIAGNOSIS — M6281 Muscle weakness (generalized): Secondary | ICD-10-CM | POA: Diagnosis not present

## 2018-12-22 DIAGNOSIS — R339 Retention of urine, unspecified: Secondary | ICD-10-CM | POA: Diagnosis not present

## 2018-12-22 DIAGNOSIS — R918 Other nonspecific abnormal finding of lung field: Secondary | ICD-10-CM | POA: Diagnosis not present

## 2018-12-22 DIAGNOSIS — S2243XA Multiple fractures of ribs, bilateral, initial encounter for closed fracture: Secondary | ICD-10-CM

## 2018-12-22 DIAGNOSIS — Z743 Need for continuous supervision: Secondary | ICD-10-CM | POA: Diagnosis not present

## 2018-12-22 DIAGNOSIS — R1312 Dysphagia, oropharyngeal phase: Secondary | ICD-10-CM | POA: Diagnosis not present

## 2018-12-22 DIAGNOSIS — S2249XA Multiple fractures of ribs, unspecified side, initial encounter for closed fracture: Secondary | ICD-10-CM | POA: Diagnosis present

## 2018-12-22 DIAGNOSIS — J690 Pneumonitis due to inhalation of food and vomit: Principal | ICD-10-CM | POA: Diagnosis present

## 2018-12-22 DIAGNOSIS — R2689 Other abnormalities of gait and mobility: Secondary | ICD-10-CM | POA: Diagnosis not present

## 2018-12-22 DIAGNOSIS — I517 Cardiomegaly: Secondary | ICD-10-CM | POA: Diagnosis not present

## 2018-12-22 DIAGNOSIS — W19XXXA Unspecified fall, initial encounter: Secondary | ICD-10-CM

## 2018-12-22 DIAGNOSIS — R000 Tachycardia, unspecified: Secondary | ICD-10-CM | POA: Diagnosis not present

## 2018-12-22 DIAGNOSIS — J9601 Acute respiratory failure with hypoxia: Secondary | ICD-10-CM | POA: Diagnosis not present

## 2018-12-22 DIAGNOSIS — S32010D Wedge compression fracture of first lumbar vertebra, subsequent encounter for fracture with routine healing: Secondary | ICD-10-CM | POA: Diagnosis not present

## 2018-12-22 DIAGNOSIS — J9690 Respiratory failure, unspecified, unspecified whether with hypoxia or hypercapnia: Secondary | ICD-10-CM

## 2018-12-22 LAB — BASIC METABOLIC PANEL
Anion gap: 12 (ref 5–15)
BUN: 14 mg/dL (ref 8–23)
CALCIUM: 8.7 mg/dL — AB (ref 8.9–10.3)
CO2: 24 mmol/L (ref 22–32)
Chloride: 106 mmol/L (ref 98–111)
Creatinine, Ser: 0.67 mg/dL (ref 0.44–1.00)
GFR calc Af Amer: >60 mL/min (ref >60)
GFR calc non Af Amer: >60 mL/min (ref >60)
Glucose, Bld: 108 mg/dL — ABNORMAL HIGH (ref 70–99)
Potassium: 3.4 mmol/L — ABNORMAL LOW (ref 3.5–5.1)
Sodium: 142 mmol/L (ref 135–145)

## 2018-12-22 LAB — URINALYSIS, ROUTINE W REFLEX MICROSCOPIC
Bacteria, UA: NONE SEEN
Bilirubin Urine: NEGATIVE
GLUCOSE, UA: NEGATIVE mg/dL
Ketones, ur: NEGATIVE mg/dL
Nitrite: NEGATIVE
PROTEIN: NEGATIVE mg/dL
Specific Gravity, Urine: 1.011 (ref 1.005–1.030)
pH: 7 (ref 5.0–8.0)

## 2018-12-22 LAB — CBC WITH DIFFERENTIAL/PLATELET
Abs Immature Granulocytes: 1.6 10*3/uL — ABNORMAL HIGH (ref 0.00–0.07)
BASOS PCT: 0 %
Basophils Absolute: 0 10*3/uL (ref 0.0–0.1)
EOS ABS: 0.1 10*3/uL (ref 0.0–0.5)
Eosinophils Relative: 0 %
HCT: 46.3 % — ABNORMAL HIGH (ref 36.0–46.0)
Hemoglobin: 14.4 g/dL (ref 12.0–15.0)
IMMATURE GRANULOCYTES: 9 %
Lymphocytes Relative: 3 %
Lymphs Abs: 0.5 10*3/uL — ABNORMAL LOW (ref 0.7–4.0)
MCH: 29.3 pg (ref 26.0–34.0)
MCHC: 31.1 g/dL (ref 30.0–36.0)
MCV: 94.1 fL (ref 80.0–100.0)
Monocytes Absolute: 1 10*3/uL (ref 0.1–1.0)
Monocytes Relative: 6 %
NEUTROS PCT: 82 %
Neutro Abs: 14.8 10*3/uL — ABNORMAL HIGH (ref 1.7–7.7)
Platelets: 244 10*3/uL (ref 150–400)
RBC: 4.92 MIL/uL (ref 3.87–5.11)
RDW: 16.2 % — AB (ref 11.5–15.5)
WBC: 18 10*3/uL — ABNORMAL HIGH (ref 4.0–10.5)
nRBC: 0.2 % (ref 0.0–0.2)

## 2018-12-22 LAB — BLOOD GAS, ARTERIAL
Acid-Base Excess: 3.1 mmol/L — ABNORMAL HIGH (ref 0.0–2.0)
Bicarbonate: 27.3 mmol/L (ref 20.0–28.0)
DRAWN BY: 21310
FIO2: 38
O2 CONTENT: 4.5 L/min
O2 Saturation: 90.8 %
Patient temperature: 37
pCO2 arterial: 36.5 mmHg (ref 32.0–48.0)
pH, Arterial: 7.475 — ABNORMAL HIGH (ref 7.350–7.450)
pO2, Arterial: 62 mmHg — ABNORMAL LOW (ref 83.0–108.0)

## 2018-12-22 LAB — TROPONIN I: Troponin I: <0.03 ng/mL (ref <0.03)

## 2018-12-22 LAB — LACTIC ACID, PLASMA: Lactic Acid, Venous: 2.1 mmol/L (ref 0.5–1.9)

## 2018-12-22 MED ORDER — ONDANSETRON HCL 4 MG/2ML IJ SOLN
4.0000 mg | Freq: Once | INTRAMUSCULAR | Status: AC
  Administered 2018-12-22: 4 mg via INTRAVENOUS
  Filled 2018-12-22: qty 2

## 2018-12-22 MED ORDER — FENTANYL CITRATE (PF) 100 MCG/2ML IJ SOLN
50.0000 ug | Freq: Once | INTRAMUSCULAR | Status: AC
  Administered 2018-12-22: 50 ug via INTRAMUSCULAR
  Filled 2018-12-22: qty 2

## 2018-12-22 MED ORDER — FENTANYL CITRATE (PF) 100 MCG/2ML IJ SOLN
25.0000 ug | Freq: Once | INTRAMUSCULAR | Status: AC
  Administered 2018-12-22: 25 ug via INTRAVENOUS
  Filled 2018-12-22: qty 2

## 2018-12-22 MED ORDER — IOHEXOL 350 MG/ML SOLN
100.0000 mL | Freq: Once | INTRAVENOUS | Status: AC | PRN
  Administered 2018-12-22: 100 mL via INTRAVENOUS

## 2018-12-22 MED ORDER — ATENOLOL 25 MG PO TABS
25.0000 mg | ORAL_TABLET | Freq: Once | ORAL | Status: AC
  Administered 2018-12-22: 25 mg via ORAL

## 2018-12-22 MED ORDER — ATENOLOL 25 MG PO TABS
50.0000 mg | ORAL_TABLET | Freq: Once | ORAL | Status: DC
  Filled 2018-12-22: qty 2

## 2018-12-22 MED ORDER — SODIUM CHLORIDE 0.9 % IV BOLUS
1000.0000 mL | Freq: Once | INTRAVENOUS | Status: AC
  Administered 2018-12-22: 1000 mL via INTRAVENOUS

## 2018-12-22 MED ORDER — KETOROLAC TROMETHAMINE 30 MG/ML IJ SOLN
15.0000 mg | Freq: Once | INTRAMUSCULAR | Status: AC
  Administered 2018-12-22: 15 mg via INTRAVENOUS
  Filled 2018-12-22: qty 1

## 2018-12-22 MED ORDER — LORAZEPAM 1 MG PO TABS
1.0000 mg | ORAL_TABLET | Freq: Once | ORAL | Status: AC
  Administered 2018-12-22: 1 mg via ORAL
  Filled 2018-12-22: qty 1

## 2018-12-22 MED ORDER — DIAZEPAM 5 MG PO TABS
2.5000 mg | ORAL_TABLET | Freq: Once | ORAL | Status: AC
  Administered 2018-12-22: 2.5 mg via ORAL
  Filled 2018-12-22: qty 1

## 2018-12-22 MED ORDER — LEVOFLOXACIN IN D5W 750 MG/150ML IV SOLN
750.0000 mg | Freq: Once | INTRAVENOUS | Status: AC
  Administered 2018-12-22: 750 mg via INTRAVENOUS
  Filled 2018-12-22: qty 150

## 2018-12-22 MED ORDER — HYDROMORPHONE HCL 1 MG/ML IJ SOLN
0.5000 mg | Freq: Once | INTRAMUSCULAR | Status: AC
  Administered 2018-12-22: 0.5 mg via INTRAVENOUS
  Filled 2018-12-22: qty 1

## 2018-12-22 NOTE — ED Notes (Signed)
Patient transported to CT 

## 2018-12-22 NOTE — ED Notes (Signed)
Pt yelling out she is in pain.  edp notified

## 2018-12-22 NOTE — ED Notes (Signed)
Date and time results received: 12/22/18 9:14 PM   (use smartphrase ".now" to insert current time)  Test: Lactic Critical Value: 2.1  Name of Provider Notified: Idol  Orders Received? Or Actions Taken?: Orders Received - See Orders for details
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
## 2018-12-22 NOTE — ED Notes (Signed)
Patient stated refusal to do CT Betty Bryan in to speak with patient
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
## 2018-12-23 ENCOUNTER — Emergency Department (HOSPITAL_COMMUNITY): Payer: BLUE CROSS/BLUE SHIELD

## 2018-12-23 DIAGNOSIS — R52 Pain, unspecified: Secondary | ICD-10-CM | POA: Diagnosis not present

## 2018-12-23 DIAGNOSIS — J189 Pneumonia, unspecified organism: Secondary | ICD-10-CM | POA: Diagnosis present

## 2018-12-23 DIAGNOSIS — S2243XA Multiple fractures of ribs, bilateral, initial encounter for closed fracture: Secondary | ICD-10-CM | POA: Diagnosis not present

## 2018-12-23 DIAGNOSIS — W19XXXA Unspecified fall, initial encounter: Secondary | ICD-10-CM

## 2018-12-23 DIAGNOSIS — S42009A Fracture of unspecified part of unspecified clavicle, initial encounter for closed fracture: Secondary | ICD-10-CM | POA: Diagnosis present

## 2018-12-23 DIAGNOSIS — Y92009 Unspecified place in unspecified non-institutional (private) residence as the place of occurrence of the external cause: Secondary | ICD-10-CM

## 2018-12-23 DIAGNOSIS — S42001A Fracture of unspecified part of right clavicle, initial encounter for closed fracture: Secondary | ICD-10-CM

## 2018-12-23 DIAGNOSIS — S2249XA Multiple fractures of ribs, unspecified side, initial encounter for closed fracture: Secondary | ICD-10-CM | POA: Diagnosis present

## 2018-12-23 LAB — LACTIC ACID, PLASMA: LACTIC ACID, VENOUS: 2.9 mmol/L — AB (ref 0.5–1.9)

## 2018-12-23 MED ORDER — SODIUM CHLORIDE 0.9% FLUSH
3.0000 mL | Freq: Two times a day (BID) | INTRAVENOUS | Status: DC
  Administered 2018-12-23 – 2018-12-27 (×8): 3 mL via INTRAVENOUS
  Administered 2018-12-27: 10 mL via INTRAVENOUS
  Administered 2018-12-28 – 2018-12-31 (×6): 3 mL via INTRAVENOUS

## 2018-12-23 MED ORDER — ONDANSETRON HCL 4 MG PO TABS: 4.0000 mg | ORAL_TABLET | Freq: Four times a day (QID) | ORAL | Status: DC | PRN

## 2018-12-23 MED ORDER — FLUTICASONE PROPIONATE 50 MCG/ACT NA SUSP
1.0000 | Freq: Every day | NASAL | Status: DC
  Administered 2018-12-23 – 2018-12-31 (×7): 1 via NASAL
  Filled 2018-12-23 (×4): qty 16

## 2018-12-23 MED ORDER — LORATADINE 10 MG PO TABS
10.0000 mg | ORAL_TABLET | Freq: Every day | ORAL | Status: DC
  Administered 2018-12-23 – 2018-12-31 (×7): 10 mg via ORAL
  Filled 2018-12-23 (×8): qty 1

## 2018-12-23 MED ORDER — SODIUM CHLORIDE 0.9% FLUSH: 3.0000 mL | INTRAVENOUS | Status: DC | PRN

## 2018-12-23 MED ORDER — ORAL CARE MOUTH RINSE
15.0000 mL | Freq: Two times a day (BID) | OROMUCOSAL | Status: DC
  Administered 2018-12-23 – 2018-12-31 (×12): 15 mL via OROMUCOSAL

## 2018-12-23 MED ORDER — ATENOLOL 25 MG PO TABS
25.0000 mg | ORAL_TABLET | Freq: Two times a day (BID) | ORAL | Status: DC
  Administered 2018-12-23 (×2): 25 mg via ORAL
  Filled 2018-12-23 (×2): qty 1

## 2018-12-23 MED ORDER — DOXYCYCLINE HYCLATE 100 MG PO TABS
100.0000 mg | ORAL_TABLET | Freq: Two times a day (BID) | ORAL | Status: DC
  Administered 2018-12-23 (×2): 100 mg via ORAL
  Filled 2018-12-23 (×2): qty 1

## 2018-12-23 MED ORDER — SODIUM CHLORIDE 0.9 % IV BOLUS
1000.0000 mL | Freq: Once | INTRAVENOUS | Status: AC
  Administered 2018-12-23: 1000 mL via INTRAVENOUS

## 2018-12-23 MED ORDER — DIAZEPAM 5 MG PO TABS
2.5000 mg | ORAL_TABLET | Freq: Two times a day (BID) | ORAL | Status: DC | PRN
  Administered 2018-12-28 – 2018-12-29 (×2): 2.5 mg via ORAL
  Filled 2018-12-23 (×2): qty 1

## 2018-12-23 MED ORDER — ONDANSETRON HCL 4 MG/2ML IJ SOLN: 4.0000 mg | Freq: Four times a day (QID) | INTRAMUSCULAR | Status: DC | PRN

## 2018-12-23 MED ORDER — DIAZEPAM 5 MG PO TABS: 1.2500 mg | ORAL_TABLET | Freq: Two times a day (BID) | ORAL | Status: DC

## 2018-12-23 MED ORDER — SODIUM CHLORIDE 0.9 % IV SOLN
250.0000 mL | INTRAVENOUS | Status: DC | PRN
  Administered 2018-12-25 – 2018-12-28 (×2): 250 mL via INTRAVENOUS

## 2018-12-23 MED ORDER — OXYCODONE-ACETAMINOPHEN 5-325 MG PO TABS
1.0000 | ORAL_TABLET | Freq: Four times a day (QID) | ORAL | Status: DC | PRN
  Administered 2018-12-23 – 2018-12-24 (×3): 2 via ORAL
  Filled 2018-12-23 (×3): qty 2

## 2018-12-23 MED ORDER — KETOROLAC TROMETHAMINE 15 MG/ML IJ SOLN
15.0000 mg | Freq: Four times a day (QID) | INTRAMUSCULAR | Status: DC | PRN
  Administered 2018-12-23: 15 mg via INTRAVENOUS
  Filled 2018-12-23: qty 1
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
## 2018-12-23 NOTE — Plan of Care (Signed)
  Problem: Acute Rehab PT Goals(only PT should resolve) Goal: Pt Will Go Supine/Side To Sit Outcome: Progressing Flowsheets (Taken 12/23/2018 1116) Pt will go Supine/Side to Sit: with minimal assist Goal: Patient Will Transfer Sit To/From Stand Outcome: Progressing Flowsheets (Taken 12/23/2018 1116) Patient will transfer sit to/from stand: with minimal assist Goal: Pt Will Transfer Bed To Chair/Chair To Bed Outcome: Progressing Flowsheets (Taken 12/23/2018 1116) Pt will Transfer Bed to Chair/Chair to Bed: with min assist Goal: Pt Will Ambulate Outcome: Progressing Flowsheets (Taken 12/23/2018 1116) Pt will Ambulate: 15 feet; with moderate assist; with cane   11:23 AM, 12/23/18 Lonell Grandchild, MPT Physical Therapist with [facility] [phone] office [phone] mobile phone
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
## 2018-12-23 NOTE — Plan of Care (Signed)

## 2018-12-23 NOTE — ED Notes (Signed)
Date and time results received: 12/23/18 12:05 AM  (use smartphrase ".now" to insert current time)  Test: lactic  Critical Value: 2.9  Name of Provider Notified: julie   Orders Received? Or Actions Taken?: .
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
## 2018-12-23 NOTE — NC FL2 (Signed)
[place] LEVEL OF CARE SCREENING TOOL     IDENTIFICATION  Patient Name: Betty Bryan Birthdate: Dec 22, 1955 Sex: female Admission Date (Current Location): 12/22/2018  [facility] and [place] Number:  [employer] and Address:  [place] [address] [place]      Provider Number: [id number]  Attending Physician Name and Address:  Barton Dubois, MD  Relative Name and Phone Number:       Current Level of Care: Other (Comment)(observation) Recommended Level of Care: [place] Prior Approval Number:    Date Approved/Denied:   PASRR Number: [id number]  Discharge Plan: SNF    Current Diagnoses: Patient Active Problem List   Diagnosis Date Noted  . Pain 12/23/2018  . Fall at home, initial encounter 12/23/2018  . Multiple rib fractures 12/23/2018  . Clavicle fracture 12/23/2018  . PNA (pneumonia) 12/23/2018  . Rib pain on right side 01/05/2018  . Varicose veins of leg with complications 92/07/9416  . Chronic venous insufficiency 06/23/2015    Orientation RESPIRATION BLADDER Height & Weight     Self, Time, Situation, Place  O2(4L) Continent Weight: 142 lb (64.4 kg) Height:  5' (152.4 cm)  BEHAVIORAL SYMPTOMS/MOOD NEUROLOGICAL BOWEL NUTRITION STATUS      Continent Diet(heart healthy)  AMBULATORY STATUS COMMUNICATION OF NEEDS Skin   Extensive Assist Verbally Normal                       Personal Care Assistance Level of Assistance  Bathing, Feeding, Dressing Bathing Assistance: Limited assistance Feeding assistance: Independent Dressing Assistance: Limited assistance     Functional Limitations Info  Sight, Hearing, Speech Sight Info: Adequate Hearing Info: Adequate Speech Info: Adequate    SPECIAL CARE FACTORS FREQUENCY  PT (By licensed PT)     PT Frequency: 5x/week              Contractures Contractures Info: Not present    Additional Factors Info  Code Status, Allergies  Code Status Info: Full Code Allergies Info: Penicillins, Sulfa Antibiotics, Morphine and related, Voltaren           Current Medications (12/23/2018):  This is the current hospital active medication list Current Facility-Administered Medications  Medication Dose Route Frequency Provider Last Rate Last Dose  . 0.9 %  sodium chloride infusion  250 mL Intravenous PRN Derrill Kay A, MD      . atenolol (TENORMIN) tablet 25 mg  25 mg Oral BID Phillips Grout, MD   25 mg at 12/23/18 4081  . diazepam (VALIUM) tablet 2.5 mg  2.5 mg Oral Q12H PRN Barton Dubois, MD      . doxycycline (VIBRA-TABS) tablet 100 mg  100 mg Oral BID Derrill Kay A, MD   100 mg at 12/23/18 4481  . fluticasone (FLONASE) 50 MCG/ACT nasal spray 1 spray  1 spray Each Nare Daily Barton Dubois, MD   1 spray at 12/23/18 1102  . ketorolac (TORADOL) 15 MG/ML injection 15 mg  15 mg Intravenous Q6H PRN Phillips Grout, MD      . loratadine (CLARITIN) tablet 10 mg  10 mg Oral Daily Barton Dubois, MD   10 mg at 12/23/18 1101  . MEDLINE mouth rinse  15 mL Mouth Rinse BID Phillips Grout, MD   15 mL at 12/23/18 0921  . ondansetron (ZOFRAN) tablet 4 mg  4 mg Oral Q6H PRN Phillips Grout, MD       Or  .  ondansetron (ZOFRAN) injection 4 mg  4 mg Intravenous Q6H PRN Phillips Grout, MD      . oxyCODONE-acetaminophen (PERCOCET/ROXICET) 5-325 MG per tablet 1-2 tablet  1-2 tablet Oral Q6H PRN Phillips Grout, MD   2 tablet at 12/23/18 0817  . sodium chloride flush (NS) 0.9 % injection 3 mL  3 mL Intravenous Q12H Derrill Kay A, MD   3 mL at 12/23/18 1102  . sodium chloride flush (NS) 0.9 % injection 3 mL  3 mL Intravenous PRN Phillips Grout, MD         Discharge Medications: Please see discharge summary for a list of discharge medications.  Relevant Imaging Results:  Relevant Lab Results:   Additional Information SSN [id number] Clydene Pugh, LCSW
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
## 2018-12-24 ENCOUNTER — Observation Stay (HOSPITAL_COMMUNITY): Payer: BLUE CROSS/BLUE SHIELD

## 2018-12-24 DIAGNOSIS — Z7951 Long term (current) use of inhaled steroids: Secondary | ICD-10-CM | POA: Diagnosis not present

## 2018-12-24 DIAGNOSIS — J690 Pneumonitis due to inhalation of food and vomit: Secondary | ICD-10-CM | POA: Diagnosis not present

## 2018-12-24 DIAGNOSIS — I480 Paroxysmal atrial fibrillation: Secondary | ICD-10-CM | POA: Diagnosis present

## 2018-12-24 DIAGNOSIS — S42001D Fracture of unspecified part of right clavicle, subsequent encounter for fracture with routine healing: Secondary | ICD-10-CM | POA: Diagnosis not present

## 2018-12-24 DIAGNOSIS — Z8249 Family history of ischemic heart disease and other diseases of the circulatory system: Secondary | ICD-10-CM | POA: Diagnosis not present

## 2018-12-24 DIAGNOSIS — Z743 Need for continuous supervision: Secondary | ICD-10-CM | POA: Diagnosis not present

## 2018-12-24 DIAGNOSIS — Z833 Family history of diabetes mellitus: Secondary | ICD-10-CM | POA: Diagnosis not present

## 2018-12-24 DIAGNOSIS — Z7901 Long term (current) use of anticoagulants: Secondary | ICD-10-CM | POA: Diagnosis not present

## 2018-12-24 DIAGNOSIS — W050XXA Fall from non-moving wheelchair, initial encounter: Secondary | ICD-10-CM | POA: Diagnosis present

## 2018-12-24 DIAGNOSIS — J189 Pneumonia, unspecified organism: Secondary | ICD-10-CM | POA: Diagnosis not present

## 2018-12-24 DIAGNOSIS — S2243XA Multiple fractures of ribs, bilateral, initial encounter for closed fracture: Secondary | ICD-10-CM | POA: Diagnosis not present

## 2018-12-24 DIAGNOSIS — M810 Age-related osteoporosis without current pathological fracture: Secondary | ICD-10-CM | POA: Diagnosis present

## 2018-12-24 DIAGNOSIS — E873 Alkalosis: Secondary | ICD-10-CM | POA: Diagnosis not present

## 2018-12-24 DIAGNOSIS — I517 Cardiomegaly: Secondary | ICD-10-CM | POA: Diagnosis not present

## 2018-12-24 DIAGNOSIS — S2241XA Multiple fractures of ribs, right side, initial encounter for closed fracture: Secondary | ICD-10-CM | POA: Diagnosis not present

## 2018-12-24 DIAGNOSIS — I10 Essential (primary) hypertension: Secondary | ICD-10-CM | POA: Diagnosis not present

## 2018-12-24 DIAGNOSIS — M549 Dorsalgia, unspecified: Secondary | ICD-10-CM | POA: Diagnosis present

## 2018-12-24 DIAGNOSIS — Z7952 Long term (current) use of systemic steroids: Secondary | ICD-10-CM | POA: Diagnosis not present

## 2018-12-24 DIAGNOSIS — R339 Retention of urine, unspecified: Secondary | ICD-10-CM | POA: Diagnosis not present

## 2018-12-24 DIAGNOSIS — Z79891 Long term (current) use of opiate analgesic: Secondary | ICD-10-CM | POA: Diagnosis not present

## 2018-12-24 DIAGNOSIS — S42001A Fracture of unspecified part of right clavicle, initial encounter for closed fracture: Secondary | ICD-10-CM | POA: Diagnosis present

## 2018-12-24 DIAGNOSIS — Y92009 Unspecified place in unspecified non-institutional (private) residence as the place of occurrence of the external cause: Secondary | ICD-10-CM | POA: Diagnosis not present

## 2018-12-24 DIAGNOSIS — R000 Tachycardia, unspecified: Secondary | ICD-10-CM | POA: Diagnosis not present

## 2018-12-24 DIAGNOSIS — S42031A Displaced fracture of lateral end of right clavicle, initial encounter for closed fracture: Secondary | ICD-10-CM | POA: Diagnosis present

## 2018-12-24 DIAGNOSIS — J9601 Acute respiratory failure with hypoxia: Secondary | ICD-10-CM | POA: Diagnosis not present

## 2018-12-24 DIAGNOSIS — Z79899 Other long term (current) drug therapy: Secondary | ICD-10-CM | POA: Diagnosis not present

## 2018-12-24 DIAGNOSIS — E2740 Unspecified adrenocortical insufficiency: Secondary | ICD-10-CM

## 2018-12-24 DIAGNOSIS — R52 Pain, unspecified: Secondary | ICD-10-CM | POA: Diagnosis not present

## 2018-12-24 DIAGNOSIS — Z880 Allergy status to penicillin: Secondary | ICD-10-CM | POA: Diagnosis not present

## 2018-12-24 DIAGNOSIS — S32010A Wedge compression fracture of first lumbar vertebra, initial encounter for closed fracture: Secondary | ICD-10-CM

## 2018-12-24 DIAGNOSIS — R0602 Shortness of breath: Secondary | ICD-10-CM

## 2018-12-24 DIAGNOSIS — E876 Hypokalemia: Secondary | ICD-10-CM | POA: Diagnosis present

## 2018-12-24 DIAGNOSIS — Z86718 Personal history of other venous thrombosis and embolism: Secondary | ICD-10-CM | POA: Diagnosis not present

## 2018-12-24 DIAGNOSIS — J849 Interstitial pulmonary disease, unspecified: Secondary | ICD-10-CM | POA: Diagnosis not present

## 2018-12-24 DIAGNOSIS — R918 Other nonspecific abnormal finding of lung field: Secondary | ICD-10-CM | POA: Diagnosis not present

## 2018-12-24 DIAGNOSIS — R0902 Hypoxemia: Secondary | ICD-10-CM | POA: Diagnosis not present

## 2018-12-24 LAB — BLOOD GAS, ARTERIAL
Acid-base deficit: 1.8 mmol/L (ref 0.0–2.0)
Bicarbonate: 24.2 mmol/L (ref 20.0–28.0)
FIO2: 100
O2 Saturation: 99.4 %
Patient temperature: 37
pCO2 arterial: 24 mmHg — ABNORMAL LOW (ref 32.0–48.0)
pH, Arterial: 7.54 — ABNORMAL HIGH (ref 7.350–7.450)
pO2, Arterial: 189 mmHg — ABNORMAL HIGH (ref 83.0–108.0)

## 2018-12-24 LAB — PROCALCITONIN: Procalcitonin: 4.75 ng/mL

## 2018-12-24 LAB — MRSA PCR SCREENING: MRSA by PCR: NEGATIVE

## 2018-12-24 MED ORDER — CLINDAMYCIN PHOSPHATE 600 MG/50ML IV SOLN
600.0000 mg | Freq: Three times a day (TID) | INTRAVENOUS | Status: DC
  Administered 2018-12-24 – 2018-12-28 (×14): 600 mg via INTRAVENOUS
  Filled 2018-12-24 (×14): qty 50

## 2018-12-24 MED ORDER — METOPROLOL TARTRATE 5 MG/5ML IV SOLN
2.5000 mg | Freq: Three times a day (TID) | INTRAVENOUS | Status: DC
  Administered 2018-12-24: 2.5 mg via INTRAVENOUS
  Filled 2018-12-24: qty 5

## 2018-12-24 MED ORDER — METOPROLOL TARTRATE 5 MG/5ML IV SOLN
2.5000 mg | Freq: Two times a day (BID) | INTRAVENOUS | Status: DC
  Administered 2018-12-25 – 2018-12-28 (×7): 2.5 mg via INTRAVENOUS
  Filled 2018-12-24 (×7): qty 5

## 2018-12-24 MED ORDER — CHLORHEXIDINE GLUCONATE 0.12 % MT SOLN
15.0000 mL | Freq: Two times a day (BID) | OROMUCOSAL | Status: DC
  Administered 2018-12-24 – 2018-12-31 (×13): 15 mL via OROMUCOSAL
  Filled 2018-12-24 (×13): qty 15

## 2018-12-24 MED ORDER — FUROSEMIDE 10 MG/ML IJ SOLN
INTRAMUSCULAR | Status: AC
  Administered 2018-12-24: 40 mg
  Filled 2018-12-24: qty 4

## 2018-12-24 MED ORDER — HYDROCORTISONE NA SUCCINATE PF 100 MG IJ SOLR
100.0000 mg | Freq: Three times a day (TID) | INTRAMUSCULAR | Status: DC
  Administered 2018-12-24 – 2018-12-27 (×9): 100 mg via INTRAVENOUS
  Filled 2018-12-24 (×9): qty 2

## 2018-12-24 MED ORDER — HEPARIN SODIUM (PORCINE) 5000 UNIT/ML IJ SOLN
5000.0000 [IU] | Freq: Three times a day (TID) | INTRAMUSCULAR | Status: DC
  Administered 2018-12-24 – 2018-12-29 (×16): 5000 [IU] via SUBCUTANEOUS
  Filled 2018-12-24 (×16): qty 1

## 2018-12-24 MED ORDER — ACETAMINOPHEN 650 MG RE SUPP
650.0000 mg | Freq: Four times a day (QID) | RECTAL | Status: DC | PRN
  Administered 2018-12-24 (×2): 650 mg via RECTAL
  Filled 2018-12-24 (×2): qty 1

## 2018-12-24 MED ORDER — HYDROMORPHONE HCL 1 MG/ML IJ SOLN
0.5000 mg | INTRAMUSCULAR | Status: DC | PRN
  Administered 2018-12-25 – 2018-12-29 (×14): 1 mg via INTRAVENOUS
  Filled 2018-12-24 (×14): qty 1

## 2018-12-24 MED ORDER — FUROSEMIDE 10 MG/ML IJ SOLN: 40.0000 mg | Freq: Once | INTRAMUSCULAR | Status: AC

## 2018-12-24 MED ORDER — SODIUM CHLORIDE 0.9 % IV BOLUS
500.0000 mL | Freq: Once | INTRAVENOUS | Status: AC
  Administered 2018-12-24: 500 mL via INTRAVENOUS

## 2018-12-24 NOTE — Progress Notes (Signed)
**Note De-identified  Obfuscation** EKG completed

## 2018-12-24 NOTE — Progress Notes (Signed)
12/24/2018 7:26 AM  Called to see patient due to rapid response event on floor.  Pt found to be in respiratory distress.  Xray suggesting she has aspirated.  EKG reviewed, sinus tachycardia, non rebreather applied with some improvement, but not fully oxgenating.   I have called for immediate transfer to stepdown ICU and starting bipap therapy immediately.  Antibiotics will need to be changed to cover aspiration.  Will update Dr. Dyann Kief.    Murvin Natal MD

## 2018-12-24 NOTE — Progress Notes (Signed)
SLP Cancellation Note  Patient Details Name: Betty Bryan MRN: [id number] DOB: 02/17/1956   Cancelled treatment:       Reason Eval/Treat Not Completed: Medical issues which prohibited therapy;Patient not medically ready; Pt requiring Bi-Pap and not medically ready for a clinical swallow evaluation.   Thank you,  Genene Churn, [place]    [place] 12/24/2018, 4:54 PM

## 2018-12-24 NOTE — Progress Notes (Signed)
Patient alert to voice and oriented to self.  Patient remains on Bipap at this time. Attempted to ween off bipap at 1400. Patient placed on 15L [place] and O2 sat dropped to 86%, patient then placed on 100% non-rebreather mask and O2 sats continued to drop into the upper 70's. Patient then placed back on Bipap and O2 sat went up to 93%. Dr. Dyann Kief made aware. Dr. Dyann Kief also made aware of patient's blood pressures, heart rates and temperatures. Dr Dyann Kief down to assess patient around 1600.

## 2018-12-24 NOTE — Progress Notes (Signed)
PT Cancellation Note  Patient Details Name: Betty Bryan MRN: [id number] DOB: Jan 27, 1956   Cancelled Treatment:    Reason Eval/Treat Not Completed: Medical issues which prohibited therapy Per RN pt not medically appropriate for therapy today due to vitals and respiratory issues.  Will hold therapy today.  [address] LPTA; [place]  Aldona Lento 12/24/2018, 8:35 AM

## 2018-12-24 NOTE — Progress Notes (Signed)
Patient's BP low on multiple checks, mid level aware. Orders placed and followed.
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
## 2018-12-24 NOTE — Progress Notes (Signed)
Rounding on pt at 2230 and noted to have O2 off, sats noted 74%, replaced O2 on 4liters and notified RT as sats would not go above 83-85%. HFNC placed by RT  at 2245. Sats up to 93%. This occurred again approx 0200, MD was paged x's 2 to make aware of declining resp. Status as well as respiratory, respiratory down to see pt and bumped up to 15 liters. New order given at 0400 for chest x-ray. Pt requested pain medication at approx. 0600, checked back within hr and respirations noted to be labored at 32-38, pt down in bed and appeared to be hyperventilating. Repositioned higher in bed, lifted HOB, called rapid response as 02 sats were still ranging from 72-82% at that time 0705. Dr. Wynetta Emery in to see pt. Pushed 40mg  of lasix IV and pt was transferred to ICU.

## 2018-12-25 ENCOUNTER — Inpatient Hospital Stay (HOSPITAL_COMMUNITY): Payer: BLUE CROSS/BLUE SHIELD

## 2018-12-25 DIAGNOSIS — J9601 Acute respiratory failure with hypoxia: Secondary | ICD-10-CM

## 2018-12-25 DIAGNOSIS — J690 Pneumonitis due to inhalation of food and vomit: Principal | ICD-10-CM

## 2018-12-25 LAB — CBC
HCT: 37.1 % (ref 36.0–46.0)
Hemoglobin: 11.7 g/dL — ABNORMAL LOW (ref 12.0–15.0)
MCH: 29.2 pg (ref 26.0–34.0)
MCHC: 31.5 g/dL (ref 30.0–36.0)
MCV: 92.5 fL (ref 80.0–100.0)
Platelets: 157 10*3/uL (ref 150–400)
RBC: 4.01 MIL/uL (ref 3.87–5.11)
RDW: 15.8 % — ABNORMAL HIGH (ref 11.5–15.5)
WBC: 13.7 10*3/uL — ABNORMAL HIGH (ref 4.0–10.5)
nRBC: 0 % (ref 0.0–0.2)

## 2018-12-25 LAB — BLOOD GAS, ARTERIAL
Acid-base deficit: 0.1 mmol/L (ref 0.0–2.0)
Bicarbonate: 24.5 mmol/L (ref 20.0–28.0)
FIO2: 70
O2 Saturation: 97.5 %
Patient temperature: 37
pCO2 arterial: 37.8 mmHg (ref 32.0–48.0)
pH, Arterial: 7.417 (ref 7.350–7.450)
pO2, Arterial: 104 mmHg (ref 83.0–108.0)

## 2018-12-25 LAB — BASIC METABOLIC PANEL
Anion gap: 14 (ref 5–15)
BUN: 17 mg/dL (ref 8–23)
CO2: 21 mmol/L — ABNORMAL LOW (ref 22–32)
Calcium: 7.7 mg/dL — ABNORMAL LOW (ref 8.9–10.3)
Chloride: 101 mmol/L (ref 98–111)
Creatinine, Ser: 0.7 mg/dL (ref 0.44–1.00)
GFR calc Af Amer: >60 mL/min (ref >60)
GFR calc non Af Amer: >60 mL/min (ref >60)
Glucose, Bld: 143 mg/dL — ABNORMAL HIGH (ref 70–99)
Potassium: 3.2 mmol/L — ABNORMAL LOW (ref 3.5–5.1)
Sodium: 136 mmol/L (ref 135–145)

## 2018-12-25 MED ORDER — POTASSIUM CHLORIDE 10 MEQ/100ML IV SOLN
10.0000 meq | INTRAVENOUS | Status: AC
  Administered 2018-12-25 (×2): 10 meq via INTRAVENOUS
  Filled 2018-12-25 (×2): qty 100

## 2018-12-25 NOTE — Progress Notes (Signed)
PT Cancellation Note  Patient Details Name: Betty Bryan MRN: [id number] DOB: [birth year]   Cancelled Treatment:    Reason Eval/Treat Not Completed: Medical issues which prohibited therapy Held therapy per pt not medially ready, currently on Bi-Pap at 70%.  [address] LPTA; [name] Aldona Lento 12/25/2018, 9:02 AM

## 2018-12-25 NOTE — Progress Notes (Signed)
SLP Cancellation Note  Patient Details Name: Betty Bryan MRN: [id number] DOB: 01-Jul-1956   Cancelled treatment:       Reason Eval/Treat Not Completed: Medical issues which prohibited therapy;Patient not medically ready ; Pt unable to tolerate being off Bi-PAP.  Thank you,  Genene Churn, [place]   [place] 12/25/2018, 3:56 PM

## 2018-12-25 NOTE — Progress Notes (Signed)
Attempted to take pt off bipap to do mouth care. Initially placed on 5 L, however pt began to desaturate to 85%. Increase O2 to 10 L HFNC pt saturating at 94%.
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
## 2018-12-26 ENCOUNTER — Inpatient Hospital Stay (HOSPITAL_COMMUNITY): Payer: BLUE CROSS/BLUE SHIELD

## 2018-12-26 LAB — BASIC METABOLIC PANEL
Anion gap: 10 (ref 5–15)
BUN: 25 mg/dL — ABNORMAL HIGH (ref 8–23)
CO2: 25 mmol/L (ref 22–32)
Calcium: 8.2 mg/dL — ABNORMAL LOW (ref 8.9–10.3)
Chloride: 103 mmol/L (ref 98–111)
Creatinine, Ser: 0.61 mg/dL (ref 0.44–1.00)
GFR calc Af Amer: >60 mL/min (ref >60)
GFR calc non Af Amer: >60 mL/min (ref >60)
Glucose, Bld: 142 mg/dL — ABNORMAL HIGH (ref 70–99)
Potassium: 3.3 mmol/L — ABNORMAL LOW (ref 3.5–5.1)
Sodium: 138 mmol/L (ref 135–145)

## 2018-12-26 LAB — BLOOD GAS, ARTERIAL
Acid-Base Excess: 1.8 mmol/L (ref 0.0–2.0)
Bicarbonate: 25.7 mmol/L (ref 20.0–28.0)
FIO2: 52
O2 Content: 8 L/min
O2 Saturation: 94.9 %
Patient temperature: 37
pCO2 arterial: 42.5 mmHg (ref 32.0–48.0)
pH, Arterial: 7.405 (ref 7.350–7.450)
pO2, Arterial: 78.3 mmHg — ABNORMAL LOW (ref 83.0–108.0)

## 2018-12-26 MED ORDER — POTASSIUM CHLORIDE 10 MEQ/100ML IV SOLN
10.0000 meq | INTRAVENOUS | Status: AC
  Administered 2018-12-26 (×2): 10 meq via INTRAVENOUS
  Filled 2018-12-26 (×2): qty 100
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
## 2018-12-27 DIAGNOSIS — I10 Essential (primary) hypertension: Secondary | ICD-10-CM

## 2018-12-27 DIAGNOSIS — R0902 Hypoxemia: Secondary | ICD-10-CM

## 2018-12-27 LAB — CBC
HCT: 35.2 % — ABNORMAL LOW (ref 36.0–46.0)
Hemoglobin: 10.9 g/dL — ABNORMAL LOW (ref 12.0–15.0)
MCH: 28.9 pg (ref 26.0–34.0)
MCHC: 31 g/dL (ref 30.0–36.0)
MCV: 93.4 fL (ref 80.0–100.0)
Platelets: 209 10*3/uL (ref 150–400)
RBC: 3.77 MIL/uL — ABNORMAL LOW (ref 3.87–5.11)
RDW: 15.8 % — ABNORMAL HIGH (ref 11.5–15.5)
WBC: 10.2 10*3/uL (ref 4.0–10.5)
nRBC: 0 % (ref 0.0–0.2)

## 2018-12-27 LAB — BASIC METABOLIC PANEL
Anion gap: 11 (ref 5–15)
BUN: 28 mg/dL — ABNORMAL HIGH (ref 8–23)
CO2: 26 mmol/L (ref 22–32)
Calcium: 8.4 mg/dL — ABNORMAL LOW (ref 8.9–10.3)
Chloride: 102 mmol/L (ref 98–111)
Creatinine, Ser: 0.47 mg/dL (ref 0.44–1.00)
GFR calc Af Amer: >60 mL/min (ref >60)
GFR calc non Af Amer: >60 mL/min (ref >60)
Glucose, Bld: 151 mg/dL — ABNORMAL HIGH (ref 70–99)
Potassium: 4.2 mmol/L (ref 3.5–5.1)
Sodium: 139 mmol/L (ref 135–145)

## 2018-12-27 LAB — CULTURE, BLOOD (ROUTINE X 2)
CULTURE: NO GROWTH
Culture: NO GROWTH
SPECIAL REQUESTS: ADEQUATE
Special Requests: ADEQUATE

## 2018-12-27 MED ORDER — CHLORHEXIDINE GLUCONATE CLOTH 2 % EX PADS
6.0000 | MEDICATED_PAD | Freq: Every day | CUTANEOUS | Status: DC
  Administered 2018-12-27 – 2018-12-31 (×5): 6 via TOPICAL

## 2018-12-27 MED ORDER — HYDROCORTISONE NA SUCCINATE PF 100 MG IJ SOLR
100.0000 mg | Freq: Two times a day (BID) | INTRAMUSCULAR | Status: DC
  Administered 2018-12-27 – 2018-12-28 (×3): 100 mg via INTRAVENOUS
  Filled 2018-12-27 (×3): qty 2
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
## 2018-12-28 LAB — TROPONIN I: Troponin I: <0.03 ng/mL (ref <0.03)

## 2018-12-28 MED ORDER — CYCLOBENZAPRINE HCL 10 MG PO TABS
5.0000 mg | ORAL_TABLET | Freq: Three times a day (TID) | ORAL | Status: DC | PRN
  Administered 2018-12-28 – 2018-12-29 (×3): 5 mg via ORAL
  Filled 2018-12-28 (×3): qty 1

## 2018-12-28 MED ORDER — CLINDAMYCIN HCL 150 MG PO CAPS
300.0000 mg | ORAL_CAPSULE | Freq: Three times a day (TID) | ORAL | Status: DC
  Administered 2018-12-28 – 2018-12-31 (×8): 300 mg via ORAL
  Filled 2018-12-28 (×8): qty 2

## 2018-12-28 MED ORDER — SODIUM CHLORIDE 0.9 % IV SOLN
INTRAVENOUS | Status: DC
  Administered 2018-12-28: 22:00:00 via INTRAVENOUS

## 2018-12-28 MED ORDER — PREDNISONE 20 MG PO TABS
40.0000 mg | ORAL_TABLET | Freq: Every day | ORAL | Status: DC
  Administered 2018-12-29: 40 mg via ORAL
  Filled 2018-12-28 (×2): qty 2

## 2018-12-28 MED ORDER — METOPROLOL TARTRATE 25 MG PO TABS
25.0000 mg | ORAL_TABLET | Freq: Two times a day (BID) | ORAL | Status: DC
  Administered 2018-12-28 – 2018-12-31 (×6): 25 mg via ORAL
  Filled 2018-12-28 (×6): qty 1

## 2018-12-28 MED ORDER — NITROGLYCERIN 0.4 MG SL SUBL
0.4000 mg | SUBLINGUAL_TABLET | SUBLINGUAL | Status: AC | PRN
  Administered 2018-12-28 (×3): 0.4 mg via SUBLINGUAL
  Filled 2018-12-28: qty 1
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
## 2018-12-28 NOTE — Clinical Social Work Note (Signed)
Betty Bryan at [facility] indicated that patient can admit to the facility on 12/29/18 if she is medically stable.
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
## 2018-12-28 NOTE — Progress Notes (Addendum)
Called by RN that patient complained of chest pain not relieved with nitroglycerin.  Patient has bilateral rib, right clavicle, lumbar thoracic compression fractures and has been having pain issues since she was admitted to the hospital.  Patient seen and examined, complains of right chest wall pain.  Tenderness on palpation elicited.  As per patient back pain has been there since she came to the hospital.  She feels that chest pain is new.  EKG obtained showed nonspecific ST changes, unchanged from previous EKG on 12/24/2018.  Assessment/plan Chest pain ?? musculoskeletal Will start Flexeril 5 mg 3 times daily as needed for muscle spasm Will obtain troponin every 6 hours x3 Continue Dilaudid 0.5 to 1 mg every 4 hours PRN for pain  Total critical time spent 35 minutes

## 2018-12-29 LAB — TROPONIN I
Troponin I: <0.03 ng/mL (ref <0.03)
Troponin I: <0.03 ng/mL (ref <0.03)

## 2018-12-29 MED ORDER — ACETAMINOPHEN 325 MG PO TABS
650.0000 mg | ORAL_TABLET | Freq: Four times a day (QID) | ORAL | Status: DC | PRN
  Administered 2018-12-29 – 2018-12-30 (×3): 650 mg via ORAL
  Filled 2018-12-29 (×3): qty 2

## 2018-12-29 MED ORDER — PREDNISONE 20 MG PO TABS
20.0000 mg | ORAL_TABLET | Freq: Every day | ORAL | Status: DC
  Administered 2018-12-30 – 2018-12-31 (×2): 20 mg via ORAL
  Filled 2018-12-29 (×3): qty 1

## 2018-12-29 MED ORDER — HYDROMORPHONE HCL 2 MG PO TABS
1.0000 mg | ORAL_TABLET | Freq: Three times a day (TID) | ORAL | Status: DC | PRN
  Administered 2018-12-30 – 2018-12-31 (×3): 1 mg via ORAL
  Filled 2018-12-29 (×3): qty 1

## 2018-12-29 MED ORDER — APIXABAN 2.5 MG PO TABS
2.5000 mg | ORAL_TABLET | Freq: Two times a day (BID) | ORAL | Status: DC
  Administered 2018-12-29 – 2018-12-31 (×4): 2.5 mg via ORAL
  Filled 2018-12-29 (×4): qty 1

## 2018-12-29 NOTE — Progress Notes (Signed)
Patient woke up complaining of chest pain 10/10. BP 151/101. Administered schedule metoprolol. Paged midlevel about chest pain. Initiated chest pain protocol. Administered nitroglycerin times 3. Patient rated pain 9/10 and stated back pain as well. MD on call came to assess patient. MD ordered troponin levels, flexeril, and prn dilaudid. Vital signs stable. Will continue to monitor patient.
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
## 2018-12-29 NOTE — Progress Notes (Signed)
Patient had Foley catheter removed on previous shift. Patient had no output. Bladder scan at 2200 showed 115 mL. Patient given po fluids. Patient still had no urine output. Bladder scan at 0230 showed 126 mL. MD notified and ordered in and out cath. Removed 200 mL. Patient tolerated well.

## 2018-12-29 NOTE — Progress Notes (Signed)
Upon entering room for O2 check found patient had taken off [place]. O2 check done patient sats were in the 80's. RT placed patient back on 3LNC sats improved to 92%.
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
## 2018-12-30 ENCOUNTER — Inpatient Hospital Stay (HOSPITAL_COMMUNITY): Payer: BLUE CROSS/BLUE SHIELD

## 2018-12-30 MED ORDER — ADULT MULTIVITAMIN W/MINERALS CH
1.0000 | ORAL_TABLET | Freq: Every day | ORAL | Status: DC
  Administered 2018-12-30 – 2018-12-31 (×2): 1 via ORAL
  Filled 2018-12-30 (×2): qty 1

## 2018-12-30 MED ORDER — ENSURE ENLIVE PO LIQD
237.0000 mL | Freq: Three times a day (TID) | ORAL | Status: DC
  Administered 2018-12-30 – 2018-12-31 (×3): 237 mL via ORAL

## 2018-12-30 MED ORDER — LIDOCAINE 5 % EX PTCH
2.0000 | MEDICATED_PATCH | CUTANEOUS | Status: DC
  Administered 2018-12-30 – 2018-12-31 (×2): 2 via TRANSDERMAL
  Filled 2018-12-30 (×2): qty 2

## 2018-12-30 NOTE — Progress Notes (Signed)
No voiding today. Bladder scan at 1400 showed 424.  Contacted Dr. Manuella Ghazi and he ordered indwelling foley.  Tech inserted foley and 400 mls returned.  Also, since being changed to thin liquids has done well with drinking with straw
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
## 2018-12-30 NOTE — Care Management (Signed)
Updated [facility] that patient may be stable for DC tomorrow.
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
## 2018-12-30 NOTE — Progress Notes (Signed)
58- patient's last urine documented was 4/7 @1428 , has not voided since, verbalized no desire to void. Bladder scan=564mls, MD paged, received verbal order for in and out cath, drained 639mls. Patient stable,continuing to monitor.
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
## 2018-12-31 DIAGNOSIS — R2689 Other abnormalities of gait and mobility: Secondary | ICD-10-CM | POA: Diagnosis not present

## 2018-12-31 DIAGNOSIS — R1312 Dysphagia, oropharyngeal phase: Secondary | ICD-10-CM | POA: Diagnosis not present

## 2018-12-31 DIAGNOSIS — I10 Essential (primary) hypertension: Secondary | ICD-10-CM | POA: Diagnosis not present

## 2018-12-31 DIAGNOSIS — M6281 Muscle weakness (generalized): Secondary | ICD-10-CM | POA: Diagnosis not present

## 2018-12-31 DIAGNOSIS — Z743 Need for continuous supervision: Secondary | ICD-10-CM | POA: Diagnosis not present

## 2018-12-31 DIAGNOSIS — R52 Pain, unspecified: Secondary | ICD-10-CM | POA: Diagnosis not present

## 2018-12-31 DIAGNOSIS — J449 Chronic obstructive pulmonary disease, unspecified: Secondary | ICD-10-CM | POA: Diagnosis not present

## 2018-12-31 DIAGNOSIS — K5190 Ulcerative colitis, unspecified, without complications: Secondary | ICD-10-CM | POA: Diagnosis not present

## 2018-12-31 DIAGNOSIS — Z9181 History of falling: Secondary | ICD-10-CM | POA: Diagnosis not present

## 2018-12-31 DIAGNOSIS — I482 Chronic atrial fibrillation, unspecified: Secondary | ICD-10-CM | POA: Diagnosis not present

## 2018-12-31 DIAGNOSIS — R000 Tachycardia, unspecified: Secondary | ICD-10-CM | POA: Diagnosis not present

## 2018-12-31 DIAGNOSIS — R41841 Cognitive communication deficit: Secondary | ICD-10-CM | POA: Diagnosis not present

## 2018-12-31 DIAGNOSIS — S32010D Wedge compression fracture of first lumbar vertebra, subsequent encounter for fracture with routine healing: Secondary | ICD-10-CM | POA: Diagnosis not present

## 2018-12-31 DIAGNOSIS — S2243XD Multiple fractures of ribs, bilateral, subsequent encounter for fracture with routine healing: Secondary | ICD-10-CM | POA: Diagnosis not present

## 2018-12-31 DIAGNOSIS — J9601 Acute respiratory failure with hypoxia: Secondary | ICD-10-CM | POA: Diagnosis not present

## 2018-12-31 DIAGNOSIS — S22070D Wedge compression fracture of T9-T10 vertebra, subsequent encounter for fracture with routine healing: Secondary | ICD-10-CM | POA: Diagnosis not present

## 2018-12-31 DIAGNOSIS — S42034D Nondisplaced fracture of lateral end of right clavicle, subsequent encounter for fracture with routine healing: Secondary | ICD-10-CM | POA: Diagnosis not present

## 2018-12-31 DIAGNOSIS — J189 Pneumonia, unspecified organism: Secondary | ICD-10-CM | POA: Diagnosis not present

## 2018-12-31 LAB — BASIC METABOLIC PANEL
Anion gap: 13 (ref 5–15)
BUN: 18 mg/dL (ref 8–23)
CO2: 23 mmol/L (ref 22–32)
Calcium: 8.2 mg/dL — ABNORMAL LOW (ref 8.9–10.3)
Chloride: 105 mmol/L (ref 98–111)
Creatinine, Ser: 0.39 mg/dL — ABNORMAL LOW (ref 0.44–1.00)
GFR calc Af Amer: >60 mL/min (ref >60)
GFR calc non Af Amer: >60 mL/min (ref >60)
Glucose, Bld: 73 mg/dL (ref 70–99)
Potassium: 3.2 mmol/L — ABNORMAL LOW (ref 3.5–5.1)
Sodium: 141 mmol/L (ref 135–145)

## 2018-12-31 LAB — CBC
HCT: 38.8 % (ref 36.0–46.0)
Hemoglobin: 11.7 g/dL — ABNORMAL LOW (ref 12.0–15.0)
MCH: 28.7 pg (ref 26.0–34.0)
MCHC: 30.2 g/dL (ref 30.0–36.0)
MCV: 95.1 fL (ref 80.0–100.0)
Platelets: 321 10*3/uL (ref 150–400)
RBC: 4.08 MIL/uL (ref 3.87–5.11)
RDW: 16.6 % — ABNORMAL HIGH (ref 11.5–15.5)
WBC: 14 10*3/uL — ABNORMAL HIGH (ref 4.0–10.5)
nRBC: 0.2 % (ref 0.0–0.2)

## 2018-12-31 MED ORDER — OXYCODONE-ACETAMINOPHEN 5-325 MG PO TABS: 1.0000 | ORAL_TABLET | Freq: Four times a day (QID) | ORAL | 0 refills | Status: AC | PRN

## 2018-12-31 MED ORDER — POTASSIUM CHLORIDE CRYS ER 20 MEQ PO TBCR
40.0000 meq | EXTENDED_RELEASE_TABLET | Freq: Once | ORAL | Status: DC
  Filled 2018-12-31: qty 2

## 2018-12-31 MED ORDER — ENSURE ENLIVE PO LIQD: 237.0000 mL | Freq: Three times a day (TID) | ORAL | 12 refills | Status: DC

## 2018-12-31 MED ORDER — DIAZEPAM 5 MG PO TABS: 2.5000 mg | ORAL_TABLET | Freq: Two times a day (BID) | ORAL | 0 refills | Status: DC | PRN

## 2018-12-31 MED ORDER — POTASSIUM CHLORIDE 20 MEQ/15ML (10%) PO SOLN
40.0000 meq | Freq: Once | ORAL | Status: AC
  Administered 2018-12-31: 40 meq via ORAL
  Filled 2018-12-31: qty 30

## 2018-12-31 MED ORDER — PREDNISONE 20 MG PO TABS: 20.0000 mg | ORAL_TABLET | Freq: Every day | ORAL | 0 refills | Status: AC

## 2018-12-31 NOTE — Plan of Care (Signed)
  Problem: Coping: Goal: Level of anxiety will decrease Outcome: Not Progressing Spit 2200 meds out (including Lopressor and Dilaudid).

## 2018-12-31 NOTE — NC FL2 (Signed)
[place] LEVEL OF CARE SCREENING TOOL     IDENTIFICATION  Patient Name: Betty Bryan Birthdate: [birth year] Sex: female Admission Date (Current Location): 12/22/2018  [facility] and [place] Number:  [employer] and Address:  [facility] [address], [place]      Provider Number: [id number]  Attending Physician Name and Address:  Rodena Goldmann, DO  Relative Name and Phone Number:       Current Level of Care: Other (Comment)(observation) Recommended Level of Care: [place] Prior Approval Number:    Date Approved/Denied:   PASRR Number: [id number]  Discharge Plan: SNF    Current Diagnoses: Patient Active Problem List   Diagnosis Date Noted  . Pain 12/23/2018  . Fall at home, initial encounter 12/23/2018  . Multiple rib fractures 12/23/2018  . Clavicle fracture 12/23/2018  . PNA (pneumonia) 12/23/2018  . Rib pain on right side 01/05/2018  . Varicose veins of leg with complications 03/18/9484  . Chronic venous insufficiency 06/23/2015    Orientation RESPIRATION BLADDER Height & Weight     Self, Time, Situation, Place  O2(4L) Indwelling catheter Weight: 67.3 kg Height:  5\' 1"  (154.9 cm)  BEHAVIORAL SYMPTOMS/MOOD NEUROLOGICAL BOWEL NUTRITION STATUS      Continent Diet(heart healthy)  AMBULATORY STATUS COMMUNICATION OF NEEDS Skin   Extensive Assist Verbally Normal                       Personal Care Assistance Level of Assistance  Bathing, Feeding, Dressing Bathing Assistance: Limited assistance Feeding assistance: Independent Dressing Assistance: Limited assistance     Functional Limitations Info  Sight, Hearing, Speech Sight Info: Adequate Hearing Info: Adequate Speech Info: Adequate    SPECIAL CARE FACTORS FREQUENCY  PT (By licensed PT)     PT Frequency: 5x/week              Contractures Contractures Info: Not present    Additional Factors Info  Code Status,  Allergies Code Status Info: Full Code Allergies Info: Penicillins, Sulfa Antibiotics, Morphine and related, Voltaren           Current Medications (12/31/2018):  This is the current hospital active medication list Current Facility-Administered Medications  Medication Dose Route Frequency Provider Last Rate Last Dose  . 0.9 %  sodium chloride infusion  250 mL Intravenous PRN Barton Dubois, MD 10 mL/hr at 12/28/18 0014 250 mL at 12/28/18 0014  . 0.9 %  sodium chloride infusion   Intravenous Continuous Schorr, Rhetta Mura, NP 10 mL/hr at 12/28/18 2213    . acetaminophen (TYLENOL) tablet 650 mg  650 mg Oral Q6H PRN Barton Dubois, MD   650 mg at 12/30/18 2228  . apixaban (ELIQUIS) tablet 2.5 mg  2.5 mg Oral BID Barton Dubois, MD   2.5 mg at 12/31/18 0856  . chlorhexidine (PERIDEX) 0.12 % solution 15 mL  15 mL Mouth Rinse BID Barton Dubois, MD   15 mL at 12/31/18 0856  . Chlorhexidine Gluconate Cloth 2 % PADS 6 each  6 each Topical Q0600 Barton Dubois, MD   6 each at 12/31/18 0420  . clindamycin (CLEOCIN) capsule 300 mg  300 mg Oral Q8H Barton Dubois, MD   300 mg at 12/31/18 0528  . cyclobenzaprine (FLEXERIL) tablet 5 mg  5 mg Oral TID PRN Oswald Hillock, MD   5 mg at 12/29/18 2141  . diazepam (VALIUM) tablet 2.5 mg  2.5 mg Oral Q12H PRN Dyann Kief,  Clifton James, MD   2.5 mg at 12/29/18 2339  . feeding supplement (ENSURE ENLIVE) (ENSURE ENLIVE) liquid 237 mL  237 mL Oral TID BM Shah, Pratik D, DO   237 mL at 12/30/18 2025  . fluticasone (FLONASE) 50 MCG/ACT nasal spray 1 spray  1 spray Each Nare Daily Barton Dubois, MD   1 spray at 12/30/18 0935  . HYDROmorphone (DILAUDID) tablet 1 mg  1 mg Oral Q8H PRN Barton Dubois, MD   1 mg at 12/30/18 2230  . lidocaine (LIDODERM) 5 % 2 patch  2 patch Transdermal Q24H Manuella Ghazi, Pratik D, DO   2 patch at 12/31/18 0854  . loratadine (CLARITIN) tablet 10 mg  10 mg Oral Daily Barton Dubois, MD   10 mg at 12/31/18 0856  . MEDLINE mouth rinse  15 mL Mouth Rinse BID Barton Dubois, MD   15 mL at 12/30/18 2026  . metoprolol tartrate (LOPRESSOR) tablet 25 mg  25 mg Oral BID Barton Dubois, MD   25 mg at 12/31/18 0856  . multivitamin with minerals tablet 1 tablet  1 tablet Oral Daily Manuella Ghazi, Pratik D, DO   1 tablet at 12/31/18 0856  . ondansetron (ZOFRAN) tablet 4 mg  4 mg Oral Q6H PRN Barton Dubois, MD       Or  . ondansetron [facility]) injection 4 mg  4 mg Intravenous Q6H PRN Barton Dubois, MD      . potassium chloride SA (K-DUR,KLOR-CON) CR tablet 40 mEq  40 mEq Oral Once Manuella Ghazi, Pratik D, DO      . predniSONE (DELTASONE) tablet 20 mg  20 mg Oral Q breakfast Barton Dubois, MD   20 mg at 12/31/18 0856  . sodium chloride flush (NS) 0.9 % injection 3 mL  3 mL Intravenous Q12H Barton Dubois, MD   3 mL at 12/30/18 2026  . sodium chloride flush (NS) 0.9 % injection 3 mL  3 mL Intravenous PRN Barton Dubois, MD         Discharge Medications: Please see discharge summary for a list of discharge medications.  Relevant Imaging Results:  Relevant Lab Results:   Additional Information SSN Philip  Diavion Labrador, Chauncey Reading, RN

## 2018-12-31 NOTE — Progress Notes (Signed)
Tele reported 23 beat run of SVT, HR 178. Not sustained. Current HR  NSR at 97. Pt spit meds out tonight which includes Lopressor 25mg s. Pt stable. Dr. Lamar Blinks paged with status.
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
## 2018-12-31 NOTE — Progress Notes (Signed)
Plans are for her to be transferred to skilled care facility today.  Chest x-ray done yesterday which I personally reviewed does look better.  Once she has improved and is transitioning to home she will need another chest x-ray to make sure that she totally clears her infiltrates particularly considering her immunocompromised from chronic prednisone use.  I will plan to sign off.  Thanks for allowing me to participate in her care

## 2019-01-19 DIAGNOSIS — J449 Chronic obstructive pulmonary disease, unspecified: Secondary | ICD-10-CM | POA: Diagnosis not present

## 2019-01-19 DIAGNOSIS — J9601 Acute respiratory failure with hypoxia: Secondary | ICD-10-CM | POA: Diagnosis not present

## 2019-01-19 DIAGNOSIS — J189 Pneumonia, unspecified organism: Secondary | ICD-10-CM | POA: Diagnosis not present

## 2019-01-20 DIAGNOSIS — J449 Chronic obstructive pulmonary disease, unspecified: Secondary | ICD-10-CM | POA: Diagnosis not present

## 2019-01-20 DIAGNOSIS — J9601 Acute respiratory failure with hypoxia: Secondary | ICD-10-CM | POA: Diagnosis not present

## 2019-01-20 DIAGNOSIS — S2243XD Multiple fractures of ribs, bilateral, subsequent encounter for fracture with routine healing: Secondary | ICD-10-CM | POA: Diagnosis not present

## 2019-01-20 DIAGNOSIS — N139 Obstructive and reflux uropathy, unspecified: Secondary | ICD-10-CM | POA: Diagnosis not present

## 2019-01-20 DIAGNOSIS — Z9181 History of falling: Secondary | ICD-10-CM | POA: Diagnosis not present

## 2019-01-20 DIAGNOSIS — S42034D Nondisplaced fracture of lateral end of right clavicle, subsequent encounter for fracture with routine healing: Secondary | ICD-10-CM | POA: Diagnosis not present

## 2019-01-20 DIAGNOSIS — I10 Essential (primary) hypertension: Secondary | ICD-10-CM | POA: Diagnosis not present

## 2019-01-20 DIAGNOSIS — J121 Respiratory syncytial virus pneumonia: Secondary | ICD-10-CM | POA: Diagnosis not present

## 2019-01-20 DIAGNOSIS — R1312 Dysphagia, oropharyngeal phase: Secondary | ICD-10-CM | POA: Diagnosis not present

## 2019-01-20 DIAGNOSIS — S32018D Other fracture of first lumbar vertebra, subsequent encounter for fracture with routine healing: Secondary | ICD-10-CM | POA: Diagnosis not present

## 2019-01-20 DIAGNOSIS — F419 Anxiety disorder, unspecified: Secondary | ICD-10-CM | POA: Diagnosis not present

## 2019-01-20 DIAGNOSIS — I872 Venous insufficiency (chronic) (peripheral): Secondary | ICD-10-CM | POA: Diagnosis not present

## 2019-01-20 DIAGNOSIS — K5190 Ulcerative colitis, unspecified, without complications: Secondary | ICD-10-CM | POA: Diagnosis not present

## 2019-01-20 DIAGNOSIS — I482 Chronic atrial fibrillation, unspecified: Secondary | ICD-10-CM | POA: Diagnosis not present

## 2019-01-20 DIAGNOSIS — Z7901 Long term (current) use of anticoagulants: Secondary | ICD-10-CM | POA: Diagnosis not present

## 2019-01-22 DIAGNOSIS — I10 Essential (primary) hypertension: Secondary | ICD-10-CM | POA: Diagnosis not present

## 2019-01-22 DIAGNOSIS — S2243XD Multiple fractures of ribs, bilateral, subsequent encounter for fracture with routine healing: Secondary | ICD-10-CM | POA: Diagnosis not present

## 2019-01-22 DIAGNOSIS — Z9181 History of falling: Secondary | ICD-10-CM | POA: Diagnosis not present

## 2019-01-22 DIAGNOSIS — R1312 Dysphagia, oropharyngeal phase: Secondary | ICD-10-CM | POA: Diagnosis not present

## 2019-01-22 DIAGNOSIS — F419 Anxiety disorder, unspecified: Secondary | ICD-10-CM | POA: Diagnosis not present

## 2019-01-22 DIAGNOSIS — S32018D Other fracture of first lumbar vertebra, subsequent encounter for fracture with routine healing: Secondary | ICD-10-CM | POA: Diagnosis not present

## 2019-01-22 DIAGNOSIS — K5190 Ulcerative colitis, unspecified, without complications: Secondary | ICD-10-CM | POA: Diagnosis not present

## 2019-01-22 DIAGNOSIS — J9601 Acute respiratory failure with hypoxia: Secondary | ICD-10-CM | POA: Diagnosis not present

## 2019-01-22 DIAGNOSIS — J121 Respiratory syncytial virus pneumonia: Secondary | ICD-10-CM | POA: Diagnosis not present

## 2019-01-22 DIAGNOSIS — N139 Obstructive and reflux uropathy, unspecified: Secondary | ICD-10-CM | POA: Diagnosis not present

## 2019-01-22 DIAGNOSIS — I482 Chronic atrial fibrillation, unspecified: Secondary | ICD-10-CM | POA: Diagnosis not present

## 2019-01-22 DIAGNOSIS — S42034D Nondisplaced fracture of lateral end of right clavicle, subsequent encounter for fracture with routine healing: Secondary | ICD-10-CM | POA: Diagnosis not present

## 2019-01-22 DIAGNOSIS — I872 Venous insufficiency (chronic) (peripheral): Secondary | ICD-10-CM | POA: Diagnosis not present

## 2019-01-22 DIAGNOSIS — J449 Chronic obstructive pulmonary disease, unspecified: Secondary | ICD-10-CM | POA: Diagnosis not present

## 2019-01-22 DIAGNOSIS — Z7901 Long term (current) use of anticoagulants: Secondary | ICD-10-CM | POA: Diagnosis not present

## 2019-01-25 DIAGNOSIS — S22070D Wedge compression fracture of T9-T10 vertebra, subsequent encounter for fracture with routine healing: Secondary | ICD-10-CM | POA: Diagnosis not present

## 2019-01-25 DIAGNOSIS — S2249XD Multiple fractures of ribs, unspecified side, subsequent encounter for fracture with routine healing: Secondary | ICD-10-CM | POA: Diagnosis not present

## 2019-01-25 DIAGNOSIS — J181 Lobar pneumonia, unspecified organism: Secondary | ICD-10-CM | POA: Diagnosis not present

## 2019-01-25 DIAGNOSIS — E249 Cushing's syndrome, unspecified: Secondary | ICD-10-CM | POA: Diagnosis not present

## 2019-01-26 DIAGNOSIS — Z9181 History of falling: Secondary | ICD-10-CM | POA: Diagnosis not present

## 2019-01-26 DIAGNOSIS — I10 Essential (primary) hypertension: Secondary | ICD-10-CM | POA: Diagnosis not present

## 2019-01-26 DIAGNOSIS — K5190 Ulcerative colitis, unspecified, without complications: Secondary | ICD-10-CM | POA: Diagnosis not present

## 2019-01-26 DIAGNOSIS — J449 Chronic obstructive pulmonary disease, unspecified: Secondary | ICD-10-CM | POA: Diagnosis not present

## 2019-01-26 DIAGNOSIS — S32018D Other fracture of first lumbar vertebra, subsequent encounter for fracture with routine healing: Secondary | ICD-10-CM | POA: Diagnosis not present

## 2019-01-26 DIAGNOSIS — R1312 Dysphagia, oropharyngeal phase: Secondary | ICD-10-CM | POA: Diagnosis not present

## 2019-01-26 DIAGNOSIS — J121 Respiratory syncytial virus pneumonia: Secondary | ICD-10-CM | POA: Diagnosis not present

## 2019-01-26 DIAGNOSIS — F419 Anxiety disorder, unspecified: Secondary | ICD-10-CM | POA: Diagnosis not present

## 2019-01-26 DIAGNOSIS — S2243XD Multiple fractures of ribs, bilateral, subsequent encounter for fracture with routine healing: Secondary | ICD-10-CM | POA: Diagnosis not present

## 2019-01-26 DIAGNOSIS — J9601 Acute respiratory failure with hypoxia: Secondary | ICD-10-CM | POA: Diagnosis not present

## 2019-01-26 DIAGNOSIS — Z7901 Long term (current) use of anticoagulants: Secondary | ICD-10-CM | POA: Diagnosis not present

## 2019-01-26 DIAGNOSIS — I872 Venous insufficiency (chronic) (peripheral): Secondary | ICD-10-CM | POA: Diagnosis not present

## 2019-01-26 DIAGNOSIS — S42034D Nondisplaced fracture of lateral end of right clavicle, subsequent encounter for fracture with routine healing: Secondary | ICD-10-CM | POA: Diagnosis not present

## 2019-01-26 DIAGNOSIS — I482 Chronic atrial fibrillation, unspecified: Secondary | ICD-10-CM | POA: Diagnosis not present

## 2019-01-26 DIAGNOSIS — N139 Obstructive and reflux uropathy, unspecified: Secondary | ICD-10-CM | POA: Diagnosis not present

## 2019-01-27 ENCOUNTER — Encounter (HOSPITAL_COMMUNITY): Admission: RE | Payer: Self-pay | Source: Home / Self Care

## 2019-01-27 ENCOUNTER — Ambulatory Visit (HOSPITAL_COMMUNITY): Admission: RE | Admit: 2019-01-27 | Payer: BLUE CROSS/BLUE SHIELD | Source: Home / Self Care | Admitting: Neurosurgery

## 2019-01-27 SURGERY — KYPHOPLASTY
Anesthesia: General

## 2019-01-28 ENCOUNTER — Other Ambulatory Visit: Payer: Self-pay

## 2019-01-28 ENCOUNTER — Encounter (HOSPITAL_COMMUNITY): Payer: Self-pay | Admitting: Emergency Medicine

## 2019-01-28 ENCOUNTER — Observation Stay (HOSPITAL_COMMUNITY)
Admission: EM | Admit: 2019-01-28 | Discharge: 2019-01-30 | Disposition: A | Discharge status: Home / Self Care | Payer: BLUE CROSS/BLUE SHIELD | Attending: Internal Medicine | Admitting: Internal Medicine

## 2019-01-28 DIAGNOSIS — Z7901 Long term (current) use of anticoagulants: Secondary | ICD-10-CM | POA: Insufficient documentation

## 2019-01-28 DIAGNOSIS — I480 Paroxysmal atrial fibrillation: Secondary | ICD-10-CM | POA: Insufficient documentation

## 2019-01-28 DIAGNOSIS — S2243XD Multiple fractures of ribs, bilateral, subsequent encounter for fracture with routine healing: Secondary | ICD-10-CM | POA: Insufficient documentation

## 2019-01-28 DIAGNOSIS — Z7952 Long term (current) use of systemic steroids: Secondary | ICD-10-CM | POA: Insufficient documentation

## 2019-01-28 DIAGNOSIS — I10 Essential (primary) hypertension: Secondary | ICD-10-CM | POA: Insufficient documentation

## 2019-01-28 DIAGNOSIS — F419 Anxiety disorder, unspecified: Secondary | ICD-10-CM | POA: Diagnosis not present

## 2019-01-28 DIAGNOSIS — M4854XD Collapsed vertebra, not elsewhere classified, thoracic region, subsequent encounter for fracture with routine healing: Secondary | ICD-10-CM | POA: Diagnosis not present

## 2019-01-28 DIAGNOSIS — R339 Retention of urine, unspecified: Secondary | ICD-10-CM | POA: Diagnosis not present

## 2019-01-28 DIAGNOSIS — E8770 Fluid overload, unspecified: Secondary | ICD-10-CM | POA: Insufficient documentation

## 2019-01-28 DIAGNOSIS — J9611 Chronic respiratory failure with hypoxia: Secondary | ICD-10-CM | POA: Diagnosis not present

## 2019-01-28 DIAGNOSIS — Z1159 Encounter for screening for other viral diseases: Secondary | ICD-10-CM | POA: Diagnosis not present

## 2019-01-28 DIAGNOSIS — S42001D Fracture of unspecified part of right clavicle, subsequent encounter for fracture with routine healing: Secondary | ICD-10-CM | POA: Diagnosis not present

## 2019-01-28 DIAGNOSIS — I872 Venous insufficiency (chronic) (peripheral): Secondary | ICD-10-CM | POA: Diagnosis not present

## 2019-01-28 DIAGNOSIS — Z7951 Long term (current) use of inhaled steroids: Secondary | ICD-10-CM | POA: Diagnosis not present

## 2019-01-28 DIAGNOSIS — D72829 Elevated white blood cell count, unspecified: Secondary | ICD-10-CM | POA: Insufficient documentation

## 2019-01-28 DIAGNOSIS — R5381 Other malaise: Secondary | ICD-10-CM | POA: Diagnosis not present

## 2019-01-28 DIAGNOSIS — W19XXXD Unspecified fall, subsequent encounter: Secondary | ICD-10-CM | POA: Diagnosis not present

## 2019-01-28 DIAGNOSIS — E2740 Unspecified adrenocortical insufficiency: Secondary | ICD-10-CM | POA: Insufficient documentation

## 2019-01-28 DIAGNOSIS — Z8249 Family history of ischemic heart disease and other diseases of the circulatory system: Secondary | ICD-10-CM | POA: Diagnosis not present

## 2019-01-28 DIAGNOSIS — M7989 Other specified soft tissue disorders: Secondary | ICD-10-CM | POA: Diagnosis not present

## 2019-01-28 DIAGNOSIS — M810 Age-related osteoporosis without current pathological fracture: Secondary | ICD-10-CM | POA: Diagnosis not present

## 2019-01-28 DIAGNOSIS — R008 Other abnormalities of heart beat: Secondary | ICD-10-CM | POA: Diagnosis not present

## 2019-01-28 DIAGNOSIS — E876 Hypokalemia: Principal | ICD-10-CM

## 2019-01-28 DIAGNOSIS — Z03818 Encounter for observation for suspected exposure to other biological agents ruled out: Secondary | ICD-10-CM | POA: Diagnosis not present

## 2019-01-28 DIAGNOSIS — Z79899 Other long term (current) drug therapy: Secondary | ICD-10-CM | POA: Diagnosis not present

## 2019-01-28 DIAGNOSIS — R0600 Dyspnea, unspecified: Secondary | ICD-10-CM

## 2019-01-28 DIAGNOSIS — J9610 Chronic respiratory failure, unspecified whether with hypoxia or hypercapnia: Secondary | ICD-10-CM

## 2019-01-28 DIAGNOSIS — K5190 Ulcerative colitis, unspecified, without complications: Secondary | ICD-10-CM | POA: Diagnosis not present

## 2019-01-28 DIAGNOSIS — R0902 Hypoxemia: Secondary | ICD-10-CM | POA: Diagnosis not present

## 2019-01-28 DIAGNOSIS — R0602 Shortness of breath: Secondary | ICD-10-CM | POA: Diagnosis not present

## 2019-01-28 DIAGNOSIS — E7849 Other hyperlipidemia: Secondary | ICD-10-CM | POA: Diagnosis not present

## 2019-01-28 DIAGNOSIS — R42 Dizziness and giddiness: Secondary | ICD-10-CM | POA: Diagnosis not present

## 2019-01-28 LAB — BASIC METABOLIC PANEL
Anion gap: 11 (ref 5–15)
BUN: 10 mg/dL (ref 8–23)
CO2: 32 mmol/L (ref 22–32)
Calcium: 7 mg/dL — ABNORMAL LOW (ref 8.9–10.3)
Chloride: 97 mmol/L — ABNORMAL LOW (ref 98–111)
Creatinine, Ser: 0.45 mg/dL (ref 0.44–1.00)
GFR calc Af Amer: >60 mL/min (ref >60)
GFR calc non Af Amer: >60 mL/min (ref >60)
Glucose, Bld: 140 mg/dL — ABNORMAL HIGH (ref 70–99)
Potassium: <2 mmol/L — CL (ref 3.5–5.1)
Sodium: 140 mmol/L (ref 135–145)

## 2019-01-28 LAB — CBC WITH DIFFERENTIAL/PLATELET
Abs Immature Granulocytes: 0.5 10*3/uL — ABNORMAL HIGH (ref 0.00–0.07)
Basophils Absolute: 0.1 10*3/uL (ref 0.0–0.1)
Basophils Relative: 0 %
Eosinophils Absolute: 0 10*3/uL (ref 0.0–0.5)
Eosinophils Relative: 0 %
HCT: 37.6 % (ref 36.0–46.0)
Hemoglobin: 12 g/dL (ref 12.0–15.0)
Immature Granulocytes: 4 %
Lymphocytes Relative: 5 %
Lymphs Abs: 0.8 10*3/uL (ref 0.7–4.0)
MCH: 29.8 pg (ref 26.0–34.0)
MCHC: 31.9 g/dL (ref 30.0–36.0)
MCV: 93.3 fL (ref 80.0–100.0)
Monocytes Absolute: 1.2 10*3/uL — ABNORMAL HIGH (ref 0.1–1.0)
Monocytes Relative: 8 %
Neutro Abs: 11.7 10*3/uL — ABNORMAL HIGH (ref 1.7–7.7)
Neutrophils Relative %: 83 %
Platelets: 271 10*3/uL (ref 150–400)
RBC: 4.03 MIL/uL (ref 3.87–5.11)
RDW: 16.3 % — ABNORMAL HIGH (ref 11.5–15.5)
WBC: 14.1 10*3/uL — ABNORMAL HIGH (ref 4.0–10.5)
nRBC: 0 % (ref 0.0–0.2)

## 2019-01-28 LAB — MAGNESIUM: Magnesium: 1.3 mg/dL — ABNORMAL LOW (ref 1.7–2.4)

## 2019-01-28 LAB — SARS CORONAVIRUS 2 BY RT PCR (HOSPITAL ORDER, PERFORMED IN ~~LOC~~ HOSPITAL LAB): SARS Coronavirus 2: NEGATIVE

## 2019-01-28 LAB — TROPONIN I: Troponin I: <0.03 ng/mL (ref <0.03)

## 2019-01-28 MED ORDER — BALSALAZIDE DISODIUM 750 MG PO CAPS
2250.0000 mg | ORAL_CAPSULE | Freq: Three times a day (TID) | ORAL | Status: DC
  Administered 2019-01-29 – 2019-01-30 (×2): 2250 mg via ORAL
  Filled 2019-01-28 (×10): qty 3

## 2019-01-28 MED ORDER — CALCIUM GLUCONATE-NACL 1-0.675 GM/50ML-% IV SOLN
1.0000 g | Freq: Once | INTRAVENOUS | Status: AC
  Administered 2019-01-28: 1000 mg via INTRAVENOUS
  Filled 2019-01-28: qty 50

## 2019-01-28 MED ORDER — MAGNESIUM SULFATE 2 GM/50ML IV SOLN
2.0000 g | Freq: Once | INTRAVENOUS | Status: AC
  Administered 2019-01-29: 2 g via INTRAVENOUS
  Filled 2019-01-28: qty 50

## 2019-01-28 MED ORDER — APIXABAN 2.5 MG PO TABS
2.5000 mg | ORAL_TABLET | Freq: Two times a day (BID) | ORAL | Status: DC
  Administered 2019-01-28 – 2019-01-30 (×4): 2.5 mg via ORAL
  Filled 2019-01-28 (×4): qty 1

## 2019-01-28 MED ORDER — GABAPENTIN 100 MG PO CAPS: 100.0000 mg | ORAL_CAPSULE | Freq: Three times a day (TID) | ORAL | Status: DC | PRN

## 2019-01-28 MED ORDER — OXYCODONE-ACETAMINOPHEN 5-325 MG PO TABS
1.0000 | ORAL_TABLET | Freq: Four times a day (QID) | ORAL | Status: DC | PRN
  Administered 2019-01-29 – 2019-01-30 (×2): 1 via ORAL
  Filled 2019-01-28 (×2): qty 1

## 2019-01-28 MED ORDER — LORATADINE 10 MG PO TABS
10.0000 mg | ORAL_TABLET | Freq: Every day | ORAL | Status: DC
  Administered 2019-01-29 – 2019-01-30 (×2): 10 mg via ORAL
  Filled 2019-01-28 (×2): qty 1

## 2019-01-28 MED ORDER — POTASSIUM CHLORIDE CRYS ER 20 MEQ PO TBCR
40.0000 meq | EXTENDED_RELEASE_TABLET | Freq: Once | ORAL | Status: AC
  Administered 2019-01-28: 40 meq via ORAL
  Filled 2019-01-28: qty 2

## 2019-01-28 MED ORDER — POTASSIUM CHLORIDE CRYS ER 20 MEQ PO TBCR
40.0000 meq | EXTENDED_RELEASE_TABLET | ORAL | Status: AC
  Administered 2019-01-28 – 2019-01-29 (×3): 40 meq via ORAL
  Filled 2019-01-28 (×3): qty 2

## 2019-01-28 MED ORDER — POTASSIUM CHLORIDE 10 MEQ/100ML IV SOLN
10.0000 meq | INTRAVENOUS | Status: AC
  Administered 2019-01-28 – 2019-01-29 (×5): 10 meq via INTRAVENOUS
  Filled 2019-01-28 (×5): qty 100

## 2019-01-28 MED ORDER — ACETAMINOPHEN 325 MG PO TABS: 650.0000 mg | ORAL_TABLET | Freq: Four times a day (QID) | ORAL | Status: DC | PRN

## 2019-01-28 MED ORDER — POLYETHYLENE GLYCOL 3350 17 G PO PACK
17.0000 g | PACK | Freq: Every day | ORAL | Status: DC | PRN
  Administered 2019-01-29: 17 g via ORAL
  Filled 2019-01-28: qty 1

## 2019-01-28 MED ORDER — PREDNISONE 20 MG PO TABS
20.0000 mg | ORAL_TABLET | Freq: Every day | ORAL | Status: DC
  Administered 2019-01-29 – 2019-01-30 (×2): 20 mg via ORAL
  Filled 2019-01-28 (×2): qty 1

## 2019-01-28 MED ORDER — HYDROCORTISONE ACETATE 25 MG RE SUPP
25.0000 mg | Freq: Every day | RECTAL | Status: DC
  Administered 2019-01-29: 25 mg via RECTAL
  Filled 2019-01-28: qty 1

## 2019-01-28 MED ORDER — ENOXAPARIN SODIUM 40 MG/0.4ML ~~LOC~~ SOLN
40.0000 mg | SUBCUTANEOUS | Status: DC
  Administered 2019-01-28: 40 mg via SUBCUTANEOUS
  Filled 2019-01-28: qty 0.4

## 2019-01-28 MED ORDER — ONDANSETRON HCL 4 MG/2ML IJ SOLN: 4.0000 mg | Freq: Four times a day (QID) | INTRAMUSCULAR | Status: DC | PRN

## 2019-01-28 MED ORDER — ACETAMINOPHEN 650 MG RE SUPP: 650.0000 mg | Freq: Four times a day (QID) | RECTAL | Status: DC | PRN

## 2019-01-28 MED ORDER — PANTOPRAZOLE SODIUM 20 MG PO TBEC
20.0000 mg | DELAYED_RELEASE_TABLET | Freq: Every day | ORAL | Status: DC
  Filled 2019-01-28 (×4): qty 1

## 2019-01-28 MED ORDER — MAGNESIUM SULFATE 2 GM/50ML IV SOLN
2.0000 g | Freq: Once | INTRAVENOUS | Status: AC
  Administered 2019-01-28: 2 g via INTRAVENOUS
  Filled 2019-01-28: qty 50

## 2019-01-28 MED ORDER — ATENOLOL 25 MG PO TABS
25.0000 mg | ORAL_TABLET | Freq: Two times a day (BID) | ORAL | Status: DC
  Administered 2019-01-28 – 2019-01-30 (×4): 25 mg via ORAL
  Filled 2019-01-28 (×4): qty 1

## 2019-01-28 MED ORDER — ONDANSETRON HCL 4 MG PO TABS: 4.0000 mg | ORAL_TABLET | Freq: Four times a day (QID) | ORAL | Status: DC | PRN

## 2019-01-28 NOTE — ED Triage Notes (Addendum)
[facility] - pt comes in stating that her PCP called and said that her potassium was critically low. Pt states she has had lightheadedness and dizziness and that was the reason she saw her PCP.
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
## 2019-01-28 NOTE — ED Notes (Signed)
Report given to [facility] in ICU

## 2019-01-28 NOTE — ED Notes (Signed)
Pt c/o the need to urinate; pt states she feels the urge but is unable; pt bladder scanned and 723ml in bladder; Dr. Letta Median and order given to in and out cath pt x one

## 2019-01-28 NOTE — ED Notes (Signed)
CRITICAL VALUE ALERT  Critical Value:  Potassium <2.0  Date & Time Notied: 01/28/2019 1901   Provider Notified: Regenia Skeeter, MD  Orders Received/Actions taken: n/a

## 2019-01-28 NOTE — ED Provider Notes (Signed)
[facility] EMERGENCY DEPARTMENT Provider Note   CSN: [id number] Arrival date & time: 01/28/19  1713    History   Chief Complaint Chief Complaint  Patient presents with   Abnormal Labs    HPI Betty Bryan is a 63 y.o. female.     HPI  63 year old female presents with low potassium.  She states that she went to get labs drawn by her PCP and he called her today to say her potassium was extremely low and she needed to come to the ER.  She thinks he said it was around 2.5.  She is been having diarrhea that ended on 5/4 because she was started on some antibiotics.  This seemed to be recurrent colitis.  She also has been feeling diffusely weak and been unable to walk due to weakness since multiple fractures over the last month or so.  She feels a little more lightheaded and dizzy over the last 24 hours than typical, otherwise denies any chest pain, abdominal pain, vomiting, cough.  Past Medical History:  Diagnosis Date   Adrenal insufficiency (HCC)    HTN (hypertension)    L4 vertebral fracture (HCC)    L4-L5 fracture   Palpitations    PVC's (premature ventricular contractions)    Ulcerative colitis     Patient Active Problem List   Diagnosis Date Noted   Pain 12/23/2018   Fall at home, initial encounter 12/23/2018   Multiple rib fractures 12/23/2018   Clavicle fracture 12/23/2018   PNA (pneumonia) 12/23/2018   Rib pain on right side 01/05/2018   Varicose veins of leg with complications 89/38/1017   Chronic venous insufficiency 06/23/2015    Past Surgical History:  Procedure Laterality Date   HAND SURGERY     NASAL SINUS SURGERY       OB History   No obstetric history on file.      Home Medications    Prior to Admission medications   Medication Sig Start Date End Date Taking? Authorizing Provider  atenolol (TENORMIN) 50 MG tablet Take 25 mg by mouth 2 (two) times daily.    Yes [provider]  balsalazide (COLAZAL) 750 MG capsule  Take 2,250 mg by mouth 3 (three) times daily.   Yes [provider]  cholecalciferol (VITAMIN D3) 25 MCG (1000 UT) tablet Take 1,000 Units by mouth daily.   Yes [provider]  Cyanocobalamin (B-12 PO) Take 2,000 mcg by mouth daily.    Yes [provider]  ELIQUIS 2.5 MG TABS tablet Take 2.5 mg by mouth 2 (two) times daily.  04/28/18  Yes [provider]  fluticasone (FLONASE) 50 MCG/ACT nasal spray Place 1 spray into both nostrils 2 (two) times daily.    Yes [provider]  gabapentin (NEURONTIN) 100 MG capsule Take 100-300 mg by mouth 3 (three) times daily as needed (for nerve pain).    Yes [provider]  HYDROCORTISONE ACE, RECTAL, 30 MG SUPP Place 1 suppository rectally at bedtime.  06/08/15  Yes [provider]  hyoscyamine (LEVSIN, ANASPAZ) 0.125 MG tablet Take 0.125 mg by mouth every 6 (six) hours as needed for bladder spasms (and/or colon spasms).  08/18/18  Yes [provider]  loratadine (CLARITIN) 10 MG tablet Take 10 mg by mouth daily.   Yes [provider]  nystatin cream (MYCOSTATIN) Apply 1 application topically daily as needed for dry skin (and/or skin rash).  05/08/18  Yes [provider]  oxyCODONE-acetaminophen (PERCOCET/ROXICET) 5-325 MG tablet Take 1-2  tablets by mouth every 6 (six) hours as needed for moderate pain or severe pain. 12/31/18  Yes Shah, Pratik D, DO  pantoprazole (PROTONIX) 20 MG tablet Take 20 mg by mouth daily.   Yes [provider]  predniSONE (DELTASONE) 20 MG tablet Take 1 tablet (20 mg total) by mouth daily with breakfast for 30 days. 12/31/18 01/30/19 Yes Shah, Pratik D, DO  calcium-vitamin D (OSCAL WITH D) 500-200 MG-UNIT tablet Take 1 tablet by mouth 3 (three) times daily. Patient not taking: Reported on 01/28/2019 11/10/18   Leandrew Koyanagi, MD  diazepam (VALIUM) 5 MG tablet Take 0.5 tablets (2.5 mg total) by mouth every 12 (twelve) hours as needed for anxiety or muscle  spasms. Patient taking differently: Take 1.25 mg by mouth 2 (two) times a day.  12/31/18   Manuella Ghazi, Pratik D, DO  feeding supplement, ENSURE ENLIVE, (ENSURE ENLIVE) LIQD Take 237 mLs by mouth 3 (three) times daily between meals. Patient not taking: Reported on 01/28/2019 12/31/18   Heath Lark D, DO  lidocaine (XYLOCAINE) 5 % ointment Apply 1 application topically 3 (three) times daily as needed. Patient not taking: Reported on 01/28/2019 11/25/18   Long, Wonda Olds, MD  zinc sulfate 220 (50 Zn) MG capsule Take 1 capsule (220 mg total) by mouth daily. Patient not taking: Reported on 11/25/2018 11/10/18   Leandrew Koyanagi, MD    Family History Family History  Problem Relation Age of Onset   Heart failure Other    Diabetes Father    Heart disease Father    Hypertension Father     Social History Social History   Tobacco Use   Smoking status: Never Smoker   Smokeless tobacco: Never Used  Substance Use Topics   Alcohol use: No    Alcohol/week: 0.0 standard drinks   Drug use: No     Allergies   Penicillins; Sulfa antibiotics; Morphine and related; and Voltaren [diclofenac]   Review of Systems Review of Systems  Constitutional: Negative for fever.  Respiratory: Negative for cough and shortness of breath.   Cardiovascular: Negative for chest pain.  Gastrointestinal: Negative for abdominal pain, diarrhea and vomiting.  Neurological: Positive for weakness and light-headedness.  All other systems reviewed and are negative.    Physical Exam Updated Vital Signs BP 123/73    Pulse 77    Temp 97.8 F (36.6 C) (Oral)    Resp 16    Ht 5' (1.524 m)    Wt 61.2 kg    SpO2 98%    BMI 26.37 kg/m   Physical Exam Vitals signs and nursing note reviewed.  Constitutional:      Appearance: She is well-developed.  HENT:     Head: Normocephalic and atraumatic.     Right Ear: External ear normal.     Left Ear: External ear normal.     Nose: Nose normal.  Eyes:     General:        Right eye: No  discharge.        Left eye: No discharge.  Cardiovascular:     Rate and Rhythm: Normal rate and regular rhythm.     Heart sounds: Normal heart sounds.  Pulmonary:     Effort: Pulmonary effort is normal.     Breath sounds: Normal breath sounds.  Abdominal:     Palpations: Abdomen is soft.     Tenderness: There is no abdominal tenderness.  Skin:    General: Skin is warm and dry.  Neurological:  Mental Status: She is alert.  Psychiatric:        Mood and Affect: Mood is not anxious.      ED Treatments / Results  Labs (all labs ordered are listed, but only abnormal results are displayed) Labs Reviewed  BASIC METABOLIC PANEL - Abnormal; Notable for the following components:      Result Value   Potassium <2.0 (*)    Chloride 97 (*)    Glucose, Bld 140 (*)    Calcium 7.0 (*)    All other components within normal limits  CBC WITH DIFFERENTIAL/PLATELET - Abnormal; Notable for the following components:   WBC 14.1 (*)    RDW 16.3 (*)    Neutro Abs 11.7 (*)    Monocytes Absolute 1.2 (*)    Abs Immature Granulocytes 0.50 (*)    All other components within normal limits  MAGNESIUM - Abnormal; Notable for the following components:   Magnesium 1.3 (*)    All other components within normal limits  SARS CORONAVIRUS 2 (HOSPITAL ORDER, [place] [facility])  TROPONIN I  CALCIUM, IONIZED    EKG EKG Interpretation  Date/Time:  Thursday Jan 28 2019 17:25:50 EDT Ventricular Rate:  79 PR Interval:    QRS Duration: 83 QT Interval:  375 QTC Calculation: 430 R Axis:   48 Text Interpretation:  Sinus rhythm Supraventricular bigeminy Borderline short PR interval Low voltage, precordial leads Nonspecific repol abnormality, diffuse leads nonspecific ST changes new since December 28 2018 Confirmed by Sherwood Gambler [phone]) on 01/28/2019 5:48:12 PM   Radiology No results found.  Procedures .Critical Care Performed by: Sherwood Gambler, MD Authorized by: Sherwood Gambler, MD    Critical care provider statement:    Critical care time (minutes):  35   Critical care time was exclusive of:  Separately billable procedures and treating other patients   Critical care was necessary to treat or prevent imminent or life-threatening deterioration of the following conditions:  Metabolic crisis   Critical care was time spent personally by me on the following activities:  Discussions with consultants, evaluation of patient's response to treatment, examination of patient, ordering and performing treatments and interventions, ordering and review of laboratory studies, pulse oximetry, re-evaluation of patient's condition, obtaining history from patient or surrogate and review of old charts   (including critical care time)  Medications Ordered in ED Medications  magnesium sulfate IVPB 2 g 50 mL (has no administration in time range)  potassium chloride SA (K-DUR) CR tablet 40 mEq (has no administration in time range)  potassium chloride 10 mEq in 100 mL IVPB (has no administration in time range)     Initial Impression / Assessment and Plan / ED Course  I have reviewed the triage vital signs and the nursing notes.  Pertinent labs & imaging results that were available during my care of the patient were reviewed by me and considered in my medical decision making (see chart for details).        Patient is found to have severely low potassium here. Also with low magnesium. Given IV magnesium, IV potassium and oral potassium. Will need further electrolyte replacement. Calcium is also low, will send ionized calcium. Currently on cardiac monitoring, though no overt dysrhythmias at this time. Discussed with hospitalist, will admit.  Final Clinical Impressions(s) / ED Diagnoses   Final diagnoses:  Hypokalemia  Hypomagnesemia    ED Discharge Orders    None       Sherwood Gambler, MD 01/28/19 [phone]

## 2019-01-29 ENCOUNTER — Observation Stay (HOSPITAL_COMMUNITY): Payer: BLUE CROSS/BLUE SHIELD

## 2019-01-29 ENCOUNTER — Observation Stay (HOSPITAL_BASED_OUTPATIENT_CLINIC_OR_DEPARTMENT_OTHER): Payer: BLUE CROSS/BLUE SHIELD

## 2019-01-29 DIAGNOSIS — I480 Paroxysmal atrial fibrillation: Secondary | ICD-10-CM | POA: Diagnosis not present

## 2019-01-29 DIAGNOSIS — I361 Nonrheumatic tricuspid (valve) insufficiency: Secondary | ICD-10-CM | POA: Diagnosis not present

## 2019-01-29 DIAGNOSIS — M7989 Other specified soft tissue disorders: Secondary | ICD-10-CM

## 2019-01-29 DIAGNOSIS — M79604 Pain in right leg: Secondary | ICD-10-CM | POA: Diagnosis not present

## 2019-01-29 DIAGNOSIS — J9611 Chronic respiratory failure with hypoxia: Secondary | ICD-10-CM

## 2019-01-29 DIAGNOSIS — I340 Nonrheumatic mitral (valve) insufficiency: Secondary | ICD-10-CM | POA: Diagnosis not present

## 2019-01-29 DIAGNOSIS — M79605 Pain in left leg: Secondary | ICD-10-CM | POA: Diagnosis not present

## 2019-01-29 DIAGNOSIS — E876 Hypokalemia: Secondary | ICD-10-CM | POA: Diagnosis not present

## 2019-01-29 DIAGNOSIS — J984 Other disorders of lung: Secondary | ICD-10-CM | POA: Diagnosis not present

## 2019-01-29 DIAGNOSIS — R600 Localized edema: Secondary | ICD-10-CM | POA: Diagnosis not present

## 2019-01-29 LAB — CBC
HCT: 41.4 % (ref 36.0–46.0)
Hemoglobin: 13.2 g/dL (ref 12.0–15.0)
MCH: 29.1 pg (ref 26.0–34.0)
MCHC: 31.9 g/dL (ref 30.0–36.0)
MCV: 91.4 fL (ref 80.0–100.0)
Platelets: 236 10*3/uL (ref 150–400)
RBC: 4.53 MIL/uL (ref 3.87–5.11)
RDW: 16.7 % — ABNORMAL HIGH (ref 11.5–15.5)
WBC: 12.9 10*3/uL — ABNORMAL HIGH (ref 4.0–10.5)
nRBC: 0 % (ref 0.0–0.2)

## 2019-01-29 LAB — URINALYSIS, COMPLETE (UACMP) WITH MICROSCOPIC
Bilirubin Urine: NEGATIVE
Glucose, UA: NEGATIVE mg/dL
Ketones, ur: NEGATIVE mg/dL
Nitrite: POSITIVE — AB
Protein, ur: 30 mg/dL — AB
Specific Gravity, Urine: 1.012 (ref 1.005–1.030)
pH: 9 — ABNORMAL HIGH (ref 5.0–8.0)

## 2019-01-29 LAB — MRSA PCR SCREENING: MRSA by PCR: NEGATIVE

## 2019-01-29 LAB — BASIC METABOLIC PANEL
Anion gap: 10 (ref 5–15)
BUN: 6 mg/dL — ABNORMAL LOW (ref 8–23)
CO2: 28 mmol/L (ref 22–32)
Calcium: 7.5 mg/dL — ABNORMAL LOW (ref 8.9–10.3)
Chloride: 106 mmol/L (ref 98–111)
Creatinine, Ser: 0.36 mg/dL — ABNORMAL LOW (ref 0.44–1.00)
GFR calc Af Amer: >60 mL/min (ref >60)
GFR calc non Af Amer: >60 mL/min (ref >60)
Glucose, Bld: 84 mg/dL (ref 70–99)
Potassium: 4.4 mmol/L (ref 3.5–5.1)
Sodium: 144 mmol/L (ref 135–145)

## 2019-01-29 LAB — HEPATIC FUNCTION PANEL
ALT: 19 U/L (ref 0–44)
AST: 35 U/L (ref 15–41)
Albumin: 2.6 g/dL — ABNORMAL LOW (ref 3.5–5.0)
Alkaline Phosphatase: 96 U/L (ref 38–126)
Bilirubin, Direct: 0.2 mg/dL (ref 0.0–0.2)
Indirect Bilirubin: 0.4 mg/dL (ref 0.3–0.9)
Total Bilirubin: 0.6 mg/dL (ref 0.3–1.2)
Total Protein: 4.9 g/dL — ABNORMAL LOW (ref 6.5–8.1)

## 2019-01-29 LAB — PROTEIN / CREATININE RATIO, URINE
Creatinine, Urine: 85.17 mg/dL
Protein Creatinine Ratio: 1.03 mg/mg{Cre} — ABNORMAL HIGH (ref 0.00–0.15)
Total Protein, Urine: 88 mg/dL

## 2019-01-29 LAB — MAGNESIUM: Magnesium: 2.3 mg/dL (ref 1.7–2.4)

## 2019-01-29 LAB — ECHOCARDIOGRAM LIMITED
Height: 60 in
Weight: 2289.26 oz

## 2019-01-29 LAB — POTASSIUM: Potassium: 3.3 mmol/L — ABNORMAL LOW (ref 3.5–5.1)

## 2019-01-29 LAB — BRAIN NATRIURETIC PEPTIDE: B Natriuretic Peptide: 339 pg/mL — ABNORMAL HIGH (ref 0.0–100.0)

## 2019-01-29 MED ORDER — FUROSEMIDE 10 MG/ML IJ SOLN
40.0000 mg | Freq: Once | INTRAMUSCULAR | Status: AC
  Administered 2019-01-29: 40 mg via INTRAVENOUS
  Filled 2019-01-29: qty 4

## 2019-01-29 MED ORDER — SALINE SPRAY 0.65 % NA SOLN
1.0000 | NASAL | Status: DC | PRN
  Filled 2019-01-29: qty 44

## 2019-01-29 MED ORDER — CHLORHEXIDINE GLUCONATE CLOTH 2 % EX PADS
6.0000 | MEDICATED_PAD | Freq: Every day | CUTANEOUS | Status: DC
  Administered 2019-01-29 – 2019-01-30 (×2): 6 via TOPICAL

## 2019-01-29 MED ORDER — PANTOPRAZOLE SODIUM 40 MG PO TBEC
40.0000 mg | DELAYED_RELEASE_TABLET | Freq: Every day | ORAL | Status: DC
  Administered 2019-01-29 – 2019-01-30 (×2): 40 mg via ORAL
  Filled 2019-01-29 (×2): qty 1

## 2019-01-29 MED ORDER — ORAL CARE MOUTH RINSE
15.0000 mL | Freq: Two times a day (BID) | OROMUCOSAL | Status: DC
  Administered 2019-01-30: 15 mL via OROMUCOSAL

## 2019-01-29 MED ORDER — PERFLUTREN LIPID MICROSPHERE
1.0000 mL | INTRAVENOUS | Status: AC | PRN
  Administered 2019-01-29: 2 mL via INTRAVENOUS
  Administered 2019-01-29: 1 mL via INTRAVENOUS

## 2019-01-29 NOTE — TOC Initial Note (Signed)
Transition of Care [facility]) - Initial/Assessment Note    Patient Details  Name: Betty Bryan MRN: [id number] Date of Birth: 1955-10-19  [facility]) CM/SW Contact:    Trish Mage, LCSW Phone Number: 01/29/2019, 3:22 PM  Clinical Narrative:    63 YO Cucasian female c/o SOB, leg edema and stating Dr told her potassium is high nd she should come to ED to get checked out.  Status is Outpatient Observation.  Lives at home alone, but employs 24 hr caregiver.  Also, states she recently began HHPT at home post d/c from rehab, and is happy with services, though she does not know the name of the agency.  Gets O2 through an organiztion in [place].  Again, does not know name.  At home, has a cane, a walker, a whellchair and a motorized scooter.  No bedside commode.  States her aide has to carry her to bathroom when she needs to go.  States she also needs extensive assist with bathing, but does have shower chair and is working on being more independent with that.  Will return home with current services at d/c.  Sis ter lives locally and is supportive.               Expected Discharge Plan: [place] Barriers to Discharge: No Barriers Identified   Patient Goals and CMS Choice Patient states their goals for this hospitalization and ongoing recovery are:: Work hard to get better.      Expected Discharge Plan and Services Expected Discharge Plan: [place] In-house Referral: NA Discharge Planning Services: CM Consult Post Acute Care Choice: Durable Medical Equipment, Home Health Living arrangements for the past 2 months: Single Family Home                                      Prior Living Arrangements/Services Living arrangements for the past 2 months: Single Family Home Lives with:: Self, Other (Comment)(24 hour caregiver) Patient language and need for interpreter reviewed:: Yes Do you feel safe going back to the place where you live?: Yes       Need for Family Participation in Patient Care: No (Comment) Care giver support system in place?: Yes (comment) Current home services: DME, Homehealth aide Criminal Activity/Legal Involvement Pertinent to Current Situation/Hospitalization: No - Comment as needed  Activities of Daily Living Home Assistive Devices/Equipment: Electric scooter, Oxygen ADL Screening (condition at time of admission) Patient's cognitive ability adequate to safely complete daily activities?: Yes Is the patient deaf or have difficulty hearing?: No Does the patient have difficulty seeing, even when wearing glasses/contacts?: No Does the patient have difficulty concentrating, remembering, or making decisions?: No Patient able to express need for assistance with ADLs?: Yes Does the patient have difficulty dressing or bathing?: Yes Independently performs ADLs?: No Communication: Independent Dressing (OT): Needs assistance Is this a change from baseline?: Pre-admission baseline Grooming: Needs assistance Is this a change from baseline?: Pre-admission baseline Feeding: Independent Bathing: Needs assistance Is this a change from baseline?: Pre-admission baseline Toileting: Needs assistance Is this a change from baseline?: Pre-admission baseline In/Out Bed: Dependent Is this a change from baseline?: Pre-admission baseline Walks in Home: Dependent Is this a change from baseline?: Pre-admission baseline Does the patient have difficulty walking or climbing stairs?: No Weakness of Legs: Both Weakness of Arms/Hands: None  Permission Sought/Granted Permission sought to share information with :  PCP    Share Information with NAME: Perini           Emotional Assessment Appearance:: Appears stated age Attitude/Demeanor/Rapport: Engaged Affect (typically observed): Appropriate   Alcohol / Substance Use: Not Applicable Psych Involvement: No (comment)  Admission diagnosis:  Hypokalemia [E87.6] Hypomagnesemia  [E83.42] Patient Active Problem List   Diagnosis Date Noted  . Chronic respiratory failure with hypoxia ([place]) 01/29/2019  . AF (paroxysmal atrial fibrillation) ([place]) 01/29/2019  . Hypomagnesemia   . Swelling of both lower extremities   . Hypokalemia 01/28/2019  . Pain 12/23/2018  . Fall at home, initial encounter 12/23/2018  . Multiple rib fractures 12/23/2018  . Clavicle fracture 12/23/2018  . PNA (pneumonia) 12/23/2018  . Rib pain on right side 01/05/2018  . Varicose veins of leg with complications 16/06/9603  . Chronic venous insufficiency 06/23/2015   PCP:  Crist Infante, MD Pharmacy:   [facility], [place] [facility] [place] [address] [place] [address] Phone: [phone] Fax: [phone]     Social Determinants of Health (SDOH) Interventions    Readmission Risk Interventions Readmission Risk Prevention Plan 01/29/2019  PCP or Specialist Appt within 5-7 Days Not Complete  Not Complete comments Left 2 messages for Dr office scheduler with no return call.  Some recent data might be hidden

## 2019-01-29 NOTE — Progress Notes (Signed)
*  PRELIMINARY RESULTS* Echocardiogram Limited 2-D Echocardiogram has been performed.  Betty Bryan 01/29/2019, 10:40 AM

## 2019-01-29 NOTE — Evaluation (Signed)
Physical Therapy Evaluation Patient Details Name: Betty Bryan MRN: [id number] DOB: 03/10/1956 Today's Date: 01/29/2019   History of Present Illness  Betty Bryan is a 63 y.o. female with medical history significant for hypertension, ulcerative colitis, adrenal insufficiency, presented to the ED with complaints of generalized weakness, dizziness and abnormal labs.  And had blood work drawn by her PCP, was called today to come to the ED as her potassium was very low- 2.5.    Clinical Impression  Patient functioning near baseline for functional mobility and gait, limited to a few slow labored steps at bedside to transfer to [facility] for a bowel movement, then later to chair.  Patient mostly limited due to fear of falling and anxiety.  Patient tolerated sitting up in chair after therapy - RN notified.  Patient will benefit from continued physical therapy in hospital and recommended venue below to increase strength, balance, endurance for safe ADLs and gait.     Follow Up Recommendations Home health PT;Supervision/Assistance - 24 hour    Equipment Recommendations  None recommended by PT    Recommendations for Other Services       Precautions / Restrictions Precautions Precautions: Fall Restrictions Weight Bearing Restrictions: No      Mobility  Bed Mobility Overal bed mobility: Needs Assistance Bed Mobility: Supine to Sit     Supine to sit: Min assist     General bed mobility comments: slightly labored movement  Transfers Overall transfer level: Needs assistance Equipment used: Rolling walker (2 wheeled) Transfers: Sit to/from [facility] Sit to Stand: Min guard;Min assist Stand pivot transfers: Min assist       General transfer comment: labored movement, very fearful of falling  Ambulation/Gait Ambulation/Gait assistance: Min assist Gait Distance (Feet): 4 Feet Assistive device: Rolling walker (2 wheeled) Gait Pattern/deviations: Decreased step length  - right;Decreased step length - left;Decreased stride length Gait velocity: decreased   General Gait Details: limited to a few slow labored steps at bedside to transfer to [facility] and then chair, limited mostly due to c/o fatigue  Stairs            Wheelchair Mobility    Modified Rankin (Stroke Patients Only)       Balance Overall balance assessment: Needs assistance Sitting-balance support: Feet supported;No upper extremity supported Sitting balance-Leahy Scale: Fair     Standing balance support: Bilateral upper extremity supported;During functional activity Standing balance-Leahy Scale: Fair Standing balance comment: using RW                             Pertinent Vitals/Pain Pain Assessment: Faces Faces Pain Scale: Hurts little more Pain Location: left clavicular area and low back with movement Pain Descriptors / Indicators: Sore;Grimacing Pain Intervention(s): Limited activity within patient's tolerance;Monitored during session    [place] expects to be discharged to:: Private residence Living Arrangements: Alone Available Help at Discharge: Personal care attendant;Available 24 hours/day Type of Home: House Home Access: Level entry     Home Layout: One level Home Equipment: Transport planner;Wheelchair - manual;Cane - single point;Shower seat;Hand held Tourist information centre manager - 2 wheels      Prior Function Level of Independence: Needs assistance   Gait / Transfers Assistance Needed: short distanced household gait using RW, uses electric stooter and/or manual w/c most of time  ADL's / Homemaking Assistance Needed: home aide that stays at night x 7 days/week, and friends helps during the day  Hand Dominance        Extremity/Trunk Assessment   Upper Extremity Assessment Upper Extremity Assessment: Generalized weakness    Lower Extremity Assessment Lower Extremity Assessment: Generalized weakness    Cervical / Trunk  Assessment Cervical / Trunk Assessment: Normal  Communication   Communication: No difficulties  Cognition Arousal/Alertness: Awake/alert Behavior During Therapy: WFL for tasks assessed/performed;Anxious Overall Cognitive Status: Within Functional Limits for tasks assessed                                        General Comments      Exercises     Assessment/Plan    PT Assessment Patient needs continued PT services  PT Problem List Decreased strength;Decreased activity tolerance;Decreased balance;Decreased mobility       PT Treatment Interventions Therapeutic exercise;Gait training;Stair training;Functional mobility training;Therapeutic activities    PT Goals (Current goals can be found in the Care Plan section)  Acute Rehab PT Goals Patient Stated Goal: return home with friends/home aides to help PT Goal Formulation: With patient Time For Goal Achievement: 02/05/19 Potential to Achieve Goals: Good    Frequency Min 3X/week   Barriers to discharge        Co-evaluation               AM-PAC PT "6 Clicks" Mobility  Outcome Measure Help needed turning from your back to your side while in a flat bed without using bedrails?: None Help needed moving from lying on your back to sitting on the side of a flat bed without using bedrails?: A Little Help needed moving to and from a bed to a chair (including a wheelchair)?: A Little Help needed standing up from a chair using your arms (e.g., wheelchair or bedside chair)?: A Little   Help needed climbing 3-5 steps with a railing? : A Lot 6 Click Score: 15    End of Session Equipment Utilized During Treatment: Oxygen Activity Tolerance: Patient tolerated treatment well;Patient limited by fatigue Patient left: in chair;with call bell/phone within reach Nurse Communication: Mobility status PT Visit Diagnosis: Unsteadiness on feet (R26.81);Other abnormalities of gait and mobility (R26.89);Muscle weakness  (generalized) (M62.81)    Time: 1497-0263 PT Time Calculation (min) (ACUTE ONLY): 30 min   Charges:   PT Evaluation $PT Eval Moderate Complexity: 1 Mod PT Treatments $Therapeutic Activity: 23-37 mins        2:20 PM, 01/29/19 Lonell Grandchild, MPT Physical Therapist with [facility] [phone] office [phone] mobile phone

## 2019-01-29 NOTE — Progress Notes (Signed)
Attempted to wean patient to room air throughout night. O2 saturation on room air in 88-91 range, patient placed back on 1L O2, saturation 94-96 on 1L. Will leave 1L on for now and discuss possible weaning throughout day with oncoming RN.

## 2019-01-29 NOTE — Clinical Social Work Note (Addendum)
Left message at Dr Perini's office to try to get hospital follow up appointment scheduled.  Left second message

## 2019-01-29 NOTE — Plan of Care (Signed)
  Problem: Acute Rehab PT Goals(only PT should resolve) Goal: Pt Will Go Supine/Side To Sit Outcome: Progressing Flowsheets (Taken 01/29/2019 1421) Pt will go Supine/Side to Sit: with min guard assist Goal: Patient Will Transfer Sit To/From Stand Outcome: Progressing Flowsheets (Taken 01/29/2019 1421) Patient will transfer sit to/from stand: with min guard assist Goal: Pt Will Transfer Bed To Chair/Chair To Bed Outcome: Progressing Flowsheets (Taken 01/29/2019 1421) Pt will Transfer Bed to Chair/Chair to Bed: min guard assist Goal: Pt Will Ambulate Outcome: Progressing Flowsheets (Taken 01/29/2019 1421) Pt will Ambulate: 25 feet; with min guard assist; with rolling walker   2:22 PM, 01/29/19 Lonell Grandchild, MPT Physical Therapist with [facility] [phone] office [phone] mobile phone
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
## 2019-01-29 NOTE — Progress Notes (Signed)
Personally reviewed CXR--increase interstitial markings and venous congestion without florid pulmonary edema -given the patient's constellation of symptoms including some increase in dyspnea and LE edema and desaturation with ambulation as well as elevated BNP-->give lasix 40 mg IV x 1 -repeat BMP in am to re-evaluate potassium  DTat

## 2019-01-30 DIAGNOSIS — I480 Paroxysmal atrial fibrillation: Secondary | ICD-10-CM | POA: Diagnosis not present

## 2019-01-30 DIAGNOSIS — J9611 Chronic respiratory failure with hypoxia: Secondary | ICD-10-CM | POA: Diagnosis not present

## 2019-01-30 DIAGNOSIS — E876 Hypokalemia: Secondary | ICD-10-CM | POA: Diagnosis not present

## 2019-01-30 LAB — BASIC METABOLIC PANEL
Anion gap: 11 (ref 5–15)
Anion gap: 13 (ref 5–15)
BUN: 9 mg/dL (ref 8–23)
BUN: 8 mg/dL (ref 8–23)
CO2: 27 mmol/L (ref 22–32)
CO2: 26 mmol/L (ref 22–32)
Calcium: 8.3 mg/dL — ABNORMAL LOW (ref 8.9–10.3)
Calcium: 8.6 mg/dL — ABNORMAL LOW (ref 8.9–10.3)
Chloride: 102 mmol/L (ref 98–111)
Chloride: 101 mmol/L (ref 98–111)
Creatinine, Ser: 0.44 mg/dL (ref 0.44–1.00)
Creatinine, Ser: 0.49 mg/dL (ref 0.44–1.00)
GFR calc Af Amer: >60 mL/min (ref >60)
GFR calc Af Amer: >60 mL/min (ref >60)
GFR calc non Af Amer: >60 mL/min (ref >60)
GFR calc non Af Amer: >60 mL/min (ref >60)
Glucose, Bld: 89 mg/dL (ref 70–99)
Glucose, Bld: 116 mg/dL — ABNORMAL HIGH (ref 70–99)
Potassium: 5 mmol/L (ref 3.5–5.1)
Potassium: 4.4 mmol/L (ref 3.5–5.1)
Sodium: 140 mmol/L (ref 135–145)
Sodium: 140 mmol/L (ref 135–145)

## 2019-01-30 LAB — BRAIN NATRIURETIC PEPTIDE: B Natriuretic Peptide: 97 pg/mL (ref 0.0–100.0)

## 2019-01-30 LAB — MAGNESIUM: Magnesium: 2.2 mg/dL (ref 1.7–2.4)

## 2019-01-30 LAB — CBC
HCT: 42.2 % (ref 36.0–46.0)
Hemoglobin: 12.7 g/dL (ref 12.0–15.0)
MCH: 29.3 pg (ref 26.0–34.0)
MCHC: 30.1 g/dL (ref 30.0–36.0)
MCV: 97.2 fL (ref 80.0–100.0)
Platelets: 282 10*3/uL (ref 150–400)
RBC: 4.34 MIL/uL (ref 3.87–5.11)
RDW: 17.1 % — ABNORMAL HIGH (ref 11.5–15.5)
WBC: 13.4 10*3/uL — ABNORMAL HIGH (ref 4.0–10.5)
nRBC: 0.1 % (ref 0.0–0.2)

## 2019-01-30 LAB — HIV ANTIBODY (ROUTINE TESTING W REFLEX): HIV Screen 4th Generation wRfx: NONREACTIVE

## 2019-01-30 MED ORDER — CIPROFLOXACIN HCL 250 MG PO TABS: 500.0000 mg | ORAL_TABLET | Freq: Two times a day (BID) | ORAL | Status: DC

## 2019-01-30 MED ORDER — POTASSIUM CHLORIDE CRYS ER 10 MEQ PO TBCR: 10.0000 meq | EXTENDED_RELEASE_TABLET | Freq: Every day | ORAL | 0 refills | Status: DC

## 2019-01-30 MED ORDER — FUROSEMIDE 20 MG PO TABS: 20.0000 mg | ORAL_TABLET | Freq: Every day | ORAL | Status: DC

## 2019-01-30 MED ORDER — FUROSEMIDE 20 MG PO TABS: 20.0000 mg | ORAL_TABLET | Freq: Every day | ORAL | 0 refills | Status: DC

## 2019-01-30 MED ORDER — POTASSIUM CHLORIDE CRYS ER 10 MEQ PO TBCR: 10.0000 meq | EXTENDED_RELEASE_TABLET | Freq: Every day | ORAL | Status: DC

## 2019-01-30 MED ORDER — CIPROFLOXACIN HCL 500 MG PO TABS: 500.0000 mg | ORAL_TABLET | Freq: Two times a day (BID) | ORAL | 0 refills | Status: DC
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
## 2019-01-30 NOTE — Progress Notes (Signed)
AVS paperwork reviewed with pt and voiced no questions or concerns. PIV removed. Pt escorted to private vehicle via w/c.

## 2019-01-31 LAB — CALCIUM, IONIZED: Calcium, Ionized, Serum: 4.2 mg/dL — ABNORMAL LOW (ref 4.5–5.6)

## 2019-02-02 DIAGNOSIS — Z7901 Long term (current) use of anticoagulants: Secondary | ICD-10-CM | POA: Diagnosis not present

## 2019-02-02 DIAGNOSIS — Z9181 History of falling: Secondary | ICD-10-CM | POA: Diagnosis not present

## 2019-02-02 DIAGNOSIS — S32018D Other fracture of first lumbar vertebra, subsequent encounter for fracture with routine healing: Secondary | ICD-10-CM | POA: Diagnosis not present

## 2019-02-02 DIAGNOSIS — J449 Chronic obstructive pulmonary disease, unspecified: Secondary | ICD-10-CM | POA: Diagnosis not present

## 2019-02-02 DIAGNOSIS — J9601 Acute respiratory failure with hypoxia: Secondary | ICD-10-CM | POA: Diagnosis not present

## 2019-02-02 DIAGNOSIS — F419 Anxiety disorder, unspecified: Secondary | ICD-10-CM | POA: Diagnosis not present

## 2019-02-02 DIAGNOSIS — R1312 Dysphagia, oropharyngeal phase: Secondary | ICD-10-CM | POA: Diagnosis not present

## 2019-02-02 DIAGNOSIS — S42034D Nondisplaced fracture of lateral end of right clavicle, subsequent encounter for fracture with routine healing: Secondary | ICD-10-CM | POA: Diagnosis not present

## 2019-02-02 DIAGNOSIS — I10 Essential (primary) hypertension: Secondary | ICD-10-CM | POA: Diagnosis not present

## 2019-02-02 DIAGNOSIS — I482 Chronic atrial fibrillation, unspecified: Secondary | ICD-10-CM | POA: Diagnosis not present

## 2019-02-02 DIAGNOSIS — J121 Respiratory syncytial virus pneumonia: Secondary | ICD-10-CM | POA: Diagnosis not present

## 2019-02-02 DIAGNOSIS — S2243XD Multiple fractures of ribs, bilateral, subsequent encounter for fracture with routine healing: Secondary | ICD-10-CM | POA: Diagnosis not present

## 2019-02-02 DIAGNOSIS — I872 Venous insufficiency (chronic) (peripheral): Secondary | ICD-10-CM | POA: Diagnosis not present

## 2019-02-02 DIAGNOSIS — N139 Obstructive and reflux uropathy, unspecified: Secondary | ICD-10-CM | POA: Diagnosis not present

## 2019-02-02 DIAGNOSIS — K5190 Ulcerative colitis, unspecified, without complications: Secondary | ICD-10-CM | POA: Diagnosis not present

## 2019-02-04 DIAGNOSIS — J121 Respiratory syncytial virus pneumonia: Secondary | ICD-10-CM | POA: Diagnosis not present

## 2019-02-04 DIAGNOSIS — R1312 Dysphagia, oropharyngeal phase: Secondary | ICD-10-CM | POA: Diagnosis not present

## 2019-02-04 DIAGNOSIS — I10 Essential (primary) hypertension: Secondary | ICD-10-CM | POA: Diagnosis not present

## 2019-02-04 DIAGNOSIS — J9601 Acute respiratory failure with hypoxia: Secondary | ICD-10-CM | POA: Diagnosis not present

## 2019-02-04 DIAGNOSIS — Z7901 Long term (current) use of anticoagulants: Secondary | ICD-10-CM | POA: Diagnosis not present

## 2019-02-04 DIAGNOSIS — S32018D Other fracture of first lumbar vertebra, subsequent encounter for fracture with routine healing: Secondary | ICD-10-CM | POA: Diagnosis not present

## 2019-02-04 DIAGNOSIS — S42034D Nondisplaced fracture of lateral end of right clavicle, subsequent encounter for fracture with routine healing: Secondary | ICD-10-CM | POA: Diagnosis not present

## 2019-02-04 DIAGNOSIS — I872 Venous insufficiency (chronic) (peripheral): Secondary | ICD-10-CM | POA: Diagnosis not present

## 2019-02-04 DIAGNOSIS — N139 Obstructive and reflux uropathy, unspecified: Secondary | ICD-10-CM | POA: Diagnosis not present

## 2019-02-04 DIAGNOSIS — S2243XD Multiple fractures of ribs, bilateral, subsequent encounter for fracture with routine healing: Secondary | ICD-10-CM | POA: Diagnosis not present

## 2019-02-04 DIAGNOSIS — I482 Chronic atrial fibrillation, unspecified: Secondary | ICD-10-CM | POA: Diagnosis not present

## 2019-02-04 DIAGNOSIS — F419 Anxiety disorder, unspecified: Secondary | ICD-10-CM | POA: Diagnosis not present

## 2019-02-04 DIAGNOSIS — Z9181 History of falling: Secondary | ICD-10-CM | POA: Diagnosis not present

## 2019-02-04 DIAGNOSIS — K5190 Ulcerative colitis, unspecified, without complications: Secondary | ICD-10-CM | POA: Diagnosis not present

## 2019-02-04 DIAGNOSIS — J449 Chronic obstructive pulmonary disease, unspecified: Secondary | ICD-10-CM | POA: Diagnosis not present

## 2019-02-05 DIAGNOSIS — S42034D Nondisplaced fracture of lateral end of right clavicle, subsequent encounter for fracture with routine healing: Secondary | ICD-10-CM | POA: Diagnosis not present

## 2019-02-05 DIAGNOSIS — J121 Respiratory syncytial virus pneumonia: Secondary | ICD-10-CM | POA: Diagnosis not present

## 2019-02-05 DIAGNOSIS — J449 Chronic obstructive pulmonary disease, unspecified: Secondary | ICD-10-CM | POA: Diagnosis not present

## 2019-02-05 DIAGNOSIS — K5190 Ulcerative colitis, unspecified, without complications: Secondary | ICD-10-CM | POA: Diagnosis not present

## 2019-02-05 DIAGNOSIS — S32018D Other fracture of first lumbar vertebra, subsequent encounter for fracture with routine healing: Secondary | ICD-10-CM | POA: Diagnosis not present

## 2019-02-05 DIAGNOSIS — I872 Venous insufficiency (chronic) (peripheral): Secondary | ICD-10-CM | POA: Diagnosis not present

## 2019-02-05 DIAGNOSIS — Z7901 Long term (current) use of anticoagulants: Secondary | ICD-10-CM | POA: Diagnosis not present

## 2019-02-05 DIAGNOSIS — I482 Chronic atrial fibrillation, unspecified: Secondary | ICD-10-CM | POA: Diagnosis not present

## 2019-02-05 DIAGNOSIS — F419 Anxiety disorder, unspecified: Secondary | ICD-10-CM | POA: Diagnosis not present

## 2019-02-05 DIAGNOSIS — R1312 Dysphagia, oropharyngeal phase: Secondary | ICD-10-CM | POA: Diagnosis not present

## 2019-02-05 DIAGNOSIS — N139 Obstructive and reflux uropathy, unspecified: Secondary | ICD-10-CM | POA: Diagnosis not present

## 2019-02-05 DIAGNOSIS — I10 Essential (primary) hypertension: Secondary | ICD-10-CM | POA: Diagnosis not present

## 2019-02-05 DIAGNOSIS — J9601 Acute respiratory failure with hypoxia: Secondary | ICD-10-CM | POA: Diagnosis not present

## 2019-02-05 DIAGNOSIS — Z9181 History of falling: Secondary | ICD-10-CM | POA: Diagnosis not present

## 2019-02-05 DIAGNOSIS — S2243XD Multiple fractures of ribs, bilateral, subsequent encounter for fracture with routine healing: Secondary | ICD-10-CM | POA: Diagnosis not present

## 2019-02-09 DIAGNOSIS — I10 Essential (primary) hypertension: Secondary | ICD-10-CM | POA: Diagnosis not present

## 2019-02-09 DIAGNOSIS — R197 Diarrhea, unspecified: Secondary | ICD-10-CM | POA: Diagnosis not present

## 2019-02-09 DIAGNOSIS — S2249XA Multiple fractures of ribs, unspecified side, initial encounter for closed fracture: Secondary | ICD-10-CM | POA: Diagnosis not present

## 2019-02-09 DIAGNOSIS — R0901 Asphyxia: Secondary | ICD-10-CM | POA: Diagnosis not present

## 2019-02-09 DIAGNOSIS — E876 Hypokalemia: Secondary | ICD-10-CM | POA: Diagnosis not present

## 2019-02-09 DIAGNOSIS — S22070A Wedge compression fracture of T9-T10 vertebra, initial encounter for closed fracture: Secondary | ICD-10-CM | POA: Diagnosis not present

## 2019-02-10 DIAGNOSIS — K5190 Ulcerative colitis, unspecified, without complications: Secondary | ICD-10-CM | POA: Diagnosis not present

## 2019-02-10 DIAGNOSIS — R1312 Dysphagia, oropharyngeal phase: Secondary | ICD-10-CM | POA: Diagnosis not present

## 2019-02-10 DIAGNOSIS — I480 Paroxysmal atrial fibrillation: Secondary | ICD-10-CM | POA: Diagnosis not present

## 2019-02-10 DIAGNOSIS — S32018D Other fracture of first lumbar vertebra, subsequent encounter for fracture with routine healing: Secondary | ICD-10-CM | POA: Diagnosis not present

## 2019-02-10 DIAGNOSIS — N390 Urinary tract infection, site not specified: Secondary | ICD-10-CM | POA: Diagnosis not present

## 2019-02-10 DIAGNOSIS — J121 Respiratory syncytial virus pneumonia: Secondary | ICD-10-CM | POA: Diagnosis not present

## 2019-02-10 DIAGNOSIS — J9611 Chronic respiratory failure with hypoxia: Secondary | ICD-10-CM | POA: Diagnosis not present

## 2019-02-10 DIAGNOSIS — I872 Venous insufficiency (chronic) (peripheral): Secondary | ICD-10-CM | POA: Diagnosis not present

## 2019-02-10 DIAGNOSIS — N139 Obstructive and reflux uropathy, unspecified: Secondary | ICD-10-CM | POA: Diagnosis not present

## 2019-02-10 DIAGNOSIS — I10 Essential (primary) hypertension: Secondary | ICD-10-CM | POA: Diagnosis not present

## 2019-02-10 DIAGNOSIS — S2241XD Multiple fractures of ribs, right side, subsequent encounter for fracture with routine healing: Secondary | ICD-10-CM | POA: Diagnosis not present

## 2019-02-10 DIAGNOSIS — Z7901 Long term (current) use of anticoagulants: Secondary | ICD-10-CM | POA: Diagnosis not present

## 2019-02-10 DIAGNOSIS — J449 Chronic obstructive pulmonary disease, unspecified: Secondary | ICD-10-CM | POA: Diagnosis not present

## 2019-02-10 DIAGNOSIS — F419 Anxiety disorder, unspecified: Secondary | ICD-10-CM | POA: Diagnosis not present

## 2019-02-10 DIAGNOSIS — S42034D Nondisplaced fracture of lateral end of right clavicle, subsequent encounter for fracture with routine healing: Secondary | ICD-10-CM | POA: Diagnosis not present

## 2019-02-11 DIAGNOSIS — S32018D Other fracture of first lumbar vertebra, subsequent encounter for fracture with routine healing: Secondary | ICD-10-CM | POA: Diagnosis not present

## 2019-02-11 DIAGNOSIS — I10 Essential (primary) hypertension: Secondary | ICD-10-CM | POA: Diagnosis not present

## 2019-02-11 DIAGNOSIS — J449 Chronic obstructive pulmonary disease, unspecified: Secondary | ICD-10-CM | POA: Diagnosis not present

## 2019-02-11 DIAGNOSIS — S42034D Nondisplaced fracture of lateral end of right clavicle, subsequent encounter for fracture with routine healing: Secondary | ICD-10-CM | POA: Diagnosis not present

## 2019-02-11 DIAGNOSIS — I480 Paroxysmal atrial fibrillation: Secondary | ICD-10-CM | POA: Diagnosis not present

## 2019-02-11 DIAGNOSIS — F419 Anxiety disorder, unspecified: Secondary | ICD-10-CM | POA: Diagnosis not present

## 2019-02-11 DIAGNOSIS — S2241XD Multiple fractures of ribs, right side, subsequent encounter for fracture with routine healing: Secondary | ICD-10-CM | POA: Diagnosis not present

## 2019-02-11 DIAGNOSIS — I872 Venous insufficiency (chronic) (peripheral): Secondary | ICD-10-CM | POA: Diagnosis not present

## 2019-02-11 DIAGNOSIS — Z7901 Long term (current) use of anticoagulants: Secondary | ICD-10-CM | POA: Diagnosis not present

## 2019-02-11 DIAGNOSIS — N139 Obstructive and reflux uropathy, unspecified: Secondary | ICD-10-CM | POA: Diagnosis not present

## 2019-02-11 DIAGNOSIS — N390 Urinary tract infection, site not specified: Secondary | ICD-10-CM | POA: Diagnosis not present

## 2019-02-11 DIAGNOSIS — J9611 Chronic respiratory failure with hypoxia: Secondary | ICD-10-CM | POA: Diagnosis not present

## 2019-02-11 DIAGNOSIS — K5190 Ulcerative colitis, unspecified, without complications: Secondary | ICD-10-CM | POA: Diagnosis not present

## 2019-02-11 DIAGNOSIS — R1312 Dysphagia, oropharyngeal phase: Secondary | ICD-10-CM | POA: Diagnosis not present

## 2019-02-11 DIAGNOSIS — J121 Respiratory syncytial virus pneumonia: Secondary | ICD-10-CM | POA: Diagnosis not present

## 2019-02-17 DIAGNOSIS — J9611 Chronic respiratory failure with hypoxia: Secondary | ICD-10-CM | POA: Diagnosis not present

## 2019-02-17 DIAGNOSIS — I10 Essential (primary) hypertension: Secondary | ICD-10-CM | POA: Diagnosis not present

## 2019-02-17 DIAGNOSIS — J121 Respiratory syncytial virus pneumonia: Secondary | ICD-10-CM | POA: Diagnosis not present

## 2019-02-17 DIAGNOSIS — Z7901 Long term (current) use of anticoagulants: Secondary | ICD-10-CM | POA: Diagnosis not present

## 2019-02-17 DIAGNOSIS — J449 Chronic obstructive pulmonary disease, unspecified: Secondary | ICD-10-CM | POA: Diagnosis not present

## 2019-02-17 DIAGNOSIS — N390 Urinary tract infection, site not specified: Secondary | ICD-10-CM | POA: Diagnosis not present

## 2019-02-17 DIAGNOSIS — R1312 Dysphagia, oropharyngeal phase: Secondary | ICD-10-CM | POA: Diagnosis not present

## 2019-02-17 DIAGNOSIS — I480 Paroxysmal atrial fibrillation: Secondary | ICD-10-CM | POA: Diagnosis not present

## 2019-02-17 DIAGNOSIS — S42034D Nondisplaced fracture of lateral end of right clavicle, subsequent encounter for fracture with routine healing: Secondary | ICD-10-CM | POA: Diagnosis not present

## 2019-02-17 DIAGNOSIS — I872 Venous insufficiency (chronic) (peripheral): Secondary | ICD-10-CM | POA: Diagnosis not present

## 2019-02-17 DIAGNOSIS — K5190 Ulcerative colitis, unspecified, without complications: Secondary | ICD-10-CM | POA: Diagnosis not present

## 2019-02-17 DIAGNOSIS — S2241XD Multiple fractures of ribs, right side, subsequent encounter for fracture with routine healing: Secondary | ICD-10-CM | POA: Diagnosis not present

## 2019-02-17 DIAGNOSIS — N139 Obstructive and reflux uropathy, unspecified: Secondary | ICD-10-CM | POA: Diagnosis not present

## 2019-02-17 DIAGNOSIS — F419 Anxiety disorder, unspecified: Secondary | ICD-10-CM | POA: Diagnosis not present

## 2019-02-17 DIAGNOSIS — S32018D Other fracture of first lumbar vertebra, subsequent encounter for fracture with routine healing: Secondary | ICD-10-CM | POA: Diagnosis not present

## 2019-02-18 DIAGNOSIS — J449 Chronic obstructive pulmonary disease, unspecified: Secondary | ICD-10-CM | POA: Diagnosis not present

## 2019-02-19 DIAGNOSIS — J121 Respiratory syncytial virus pneumonia: Secondary | ICD-10-CM | POA: Diagnosis not present

## 2019-02-19 DIAGNOSIS — I480 Paroxysmal atrial fibrillation: Secondary | ICD-10-CM | POA: Diagnosis not present

## 2019-02-19 DIAGNOSIS — S2241XD Multiple fractures of ribs, right side, subsequent encounter for fracture with routine healing: Secondary | ICD-10-CM | POA: Diagnosis not present

## 2019-02-19 DIAGNOSIS — J9611 Chronic respiratory failure with hypoxia: Secondary | ICD-10-CM | POA: Diagnosis not present

## 2019-02-19 DIAGNOSIS — Z7901 Long term (current) use of anticoagulants: Secondary | ICD-10-CM | POA: Diagnosis not present

## 2019-02-19 DIAGNOSIS — I10 Essential (primary) hypertension: Secondary | ICD-10-CM | POA: Diagnosis not present

## 2019-02-19 DIAGNOSIS — S32018D Other fracture of first lumbar vertebra, subsequent encounter for fracture with routine healing: Secondary | ICD-10-CM | POA: Diagnosis not present

## 2019-02-19 DIAGNOSIS — F419 Anxiety disorder, unspecified: Secondary | ICD-10-CM | POA: Diagnosis not present

## 2019-02-19 DIAGNOSIS — J449 Chronic obstructive pulmonary disease, unspecified: Secondary | ICD-10-CM | POA: Diagnosis not present

## 2019-02-19 DIAGNOSIS — K5190 Ulcerative colitis, unspecified, without complications: Secondary | ICD-10-CM | POA: Diagnosis not present

## 2019-02-19 DIAGNOSIS — S42034D Nondisplaced fracture of lateral end of right clavicle, subsequent encounter for fracture with routine healing: Secondary | ICD-10-CM | POA: Diagnosis not present

## 2019-02-19 DIAGNOSIS — I872 Venous insufficiency (chronic) (peripheral): Secondary | ICD-10-CM | POA: Diagnosis not present

## 2019-02-19 DIAGNOSIS — R1312 Dysphagia, oropharyngeal phase: Secondary | ICD-10-CM | POA: Diagnosis not present

## 2019-02-19 DIAGNOSIS — N390 Urinary tract infection, site not specified: Secondary | ICD-10-CM | POA: Diagnosis not present

## 2019-02-19 DIAGNOSIS — N139 Obstructive and reflux uropathy, unspecified: Secondary | ICD-10-CM | POA: Diagnosis not present

## 2019-02-22 DIAGNOSIS — I480 Paroxysmal atrial fibrillation: Secondary | ICD-10-CM | POA: Diagnosis not present

## 2019-02-22 DIAGNOSIS — I10 Essential (primary) hypertension: Secondary | ICD-10-CM | POA: Diagnosis not present

## 2019-02-22 DIAGNOSIS — J121 Respiratory syncytial virus pneumonia: Secondary | ICD-10-CM | POA: Diagnosis not present

## 2019-02-22 DIAGNOSIS — N390 Urinary tract infection, site not specified: Secondary | ICD-10-CM | POA: Diagnosis not present

## 2019-02-22 DIAGNOSIS — J9611 Chronic respiratory failure with hypoxia: Secondary | ICD-10-CM | POA: Diagnosis not present

## 2019-02-22 DIAGNOSIS — S42034D Nondisplaced fracture of lateral end of right clavicle, subsequent encounter for fracture with routine healing: Secondary | ICD-10-CM | POA: Diagnosis not present

## 2019-02-22 DIAGNOSIS — K5190 Ulcerative colitis, unspecified, without complications: Secondary | ICD-10-CM | POA: Diagnosis not present

## 2019-02-22 DIAGNOSIS — J449 Chronic obstructive pulmonary disease, unspecified: Secondary | ICD-10-CM | POA: Diagnosis not present

## 2019-02-22 DIAGNOSIS — I872 Venous insufficiency (chronic) (peripheral): Secondary | ICD-10-CM | POA: Diagnosis not present

## 2019-02-22 DIAGNOSIS — Z7901 Long term (current) use of anticoagulants: Secondary | ICD-10-CM | POA: Diagnosis not present

## 2019-02-22 DIAGNOSIS — S32018D Other fracture of first lumbar vertebra, subsequent encounter for fracture with routine healing: Secondary | ICD-10-CM | POA: Diagnosis not present

## 2019-02-22 DIAGNOSIS — S2241XD Multiple fractures of ribs, right side, subsequent encounter for fracture with routine healing: Secondary | ICD-10-CM | POA: Diagnosis not present

## 2019-02-22 DIAGNOSIS — R1312 Dysphagia, oropharyngeal phase: Secondary | ICD-10-CM | POA: Diagnosis not present

## 2019-02-22 DIAGNOSIS — N139 Obstructive and reflux uropathy, unspecified: Secondary | ICD-10-CM | POA: Diagnosis not present

## 2019-02-22 DIAGNOSIS — F419 Anxiety disorder, unspecified: Secondary | ICD-10-CM | POA: Diagnosis not present

## 2019-02-25 DIAGNOSIS — S42034D Nondisplaced fracture of lateral end of right clavicle, subsequent encounter for fracture with routine healing: Secondary | ICD-10-CM | POA: Diagnosis not present

## 2019-02-25 DIAGNOSIS — K5190 Ulcerative colitis, unspecified, without complications: Secondary | ICD-10-CM | POA: Diagnosis not present

## 2019-02-25 DIAGNOSIS — J121 Respiratory syncytial virus pneumonia: Secondary | ICD-10-CM | POA: Diagnosis not present

## 2019-02-25 DIAGNOSIS — R1312 Dysphagia, oropharyngeal phase: Secondary | ICD-10-CM | POA: Diagnosis not present

## 2019-02-25 DIAGNOSIS — S32018D Other fracture of first lumbar vertebra, subsequent encounter for fracture with routine healing: Secondary | ICD-10-CM | POA: Diagnosis not present

## 2019-02-25 DIAGNOSIS — N139 Obstructive and reflux uropathy, unspecified: Secondary | ICD-10-CM | POA: Diagnosis not present

## 2019-02-25 DIAGNOSIS — I872 Venous insufficiency (chronic) (peripheral): Secondary | ICD-10-CM | POA: Diagnosis not present

## 2019-02-25 DIAGNOSIS — S2241XD Multiple fractures of ribs, right side, subsequent encounter for fracture with routine healing: Secondary | ICD-10-CM | POA: Diagnosis not present

## 2019-02-25 DIAGNOSIS — J9611 Chronic respiratory failure with hypoxia: Secondary | ICD-10-CM | POA: Diagnosis not present

## 2019-02-25 DIAGNOSIS — J449 Chronic obstructive pulmonary disease, unspecified: Secondary | ICD-10-CM | POA: Diagnosis not present

## 2019-02-25 DIAGNOSIS — Z7901 Long term (current) use of anticoagulants: Secondary | ICD-10-CM | POA: Diagnosis not present

## 2019-02-25 DIAGNOSIS — F419 Anxiety disorder, unspecified: Secondary | ICD-10-CM | POA: Diagnosis not present

## 2019-02-25 DIAGNOSIS — N390 Urinary tract infection, site not specified: Secondary | ICD-10-CM | POA: Diagnosis not present

## 2019-02-25 DIAGNOSIS — I480 Paroxysmal atrial fibrillation: Secondary | ICD-10-CM | POA: Diagnosis not present

## 2019-02-25 DIAGNOSIS — I10 Essential (primary) hypertension: Secondary | ICD-10-CM | POA: Diagnosis not present

## 2019-03-02 DIAGNOSIS — R1312 Dysphagia, oropharyngeal phase: Secondary | ICD-10-CM | POA: Diagnosis not present

## 2019-03-02 DIAGNOSIS — N139 Obstructive and reflux uropathy, unspecified: Secondary | ICD-10-CM | POA: Diagnosis not present

## 2019-03-02 DIAGNOSIS — I10 Essential (primary) hypertension: Secondary | ICD-10-CM | POA: Diagnosis not present

## 2019-03-02 DIAGNOSIS — J121 Respiratory syncytial virus pneumonia: Secondary | ICD-10-CM | POA: Diagnosis not present

## 2019-03-02 DIAGNOSIS — F419 Anxiety disorder, unspecified: Secondary | ICD-10-CM | POA: Diagnosis not present

## 2019-03-02 DIAGNOSIS — I480 Paroxysmal atrial fibrillation: Secondary | ICD-10-CM | POA: Diagnosis not present

## 2019-03-02 DIAGNOSIS — K5190 Ulcerative colitis, unspecified, without complications: Secondary | ICD-10-CM | POA: Diagnosis not present

## 2019-03-02 DIAGNOSIS — N390 Urinary tract infection, site not specified: Secondary | ICD-10-CM | POA: Diagnosis not present

## 2019-03-02 DIAGNOSIS — J9611 Chronic respiratory failure with hypoxia: Secondary | ICD-10-CM | POA: Diagnosis not present

## 2019-03-02 DIAGNOSIS — S32018D Other fracture of first lumbar vertebra, subsequent encounter for fracture with routine healing: Secondary | ICD-10-CM | POA: Diagnosis not present

## 2019-03-02 DIAGNOSIS — J449 Chronic obstructive pulmonary disease, unspecified: Secondary | ICD-10-CM | POA: Diagnosis not present

## 2019-03-02 DIAGNOSIS — S42034D Nondisplaced fracture of lateral end of right clavicle, subsequent encounter for fracture with routine healing: Secondary | ICD-10-CM | POA: Diagnosis not present

## 2019-03-02 DIAGNOSIS — Z7901 Long term (current) use of anticoagulants: Secondary | ICD-10-CM | POA: Diagnosis not present

## 2019-03-02 DIAGNOSIS — I872 Venous insufficiency (chronic) (peripheral): Secondary | ICD-10-CM | POA: Diagnosis not present

## 2019-03-02 DIAGNOSIS — S2241XD Multiple fractures of ribs, right side, subsequent encounter for fracture with routine healing: Secondary | ICD-10-CM | POA: Diagnosis not present

## 2019-03-04 DIAGNOSIS — J449 Chronic obstructive pulmonary disease, unspecified: Secondary | ICD-10-CM | POA: Diagnosis not present

## 2019-03-04 DIAGNOSIS — J121 Respiratory syncytial virus pneumonia: Secondary | ICD-10-CM | POA: Diagnosis not present

## 2019-03-04 DIAGNOSIS — F419 Anxiety disorder, unspecified: Secondary | ICD-10-CM | POA: Diagnosis not present

## 2019-03-04 DIAGNOSIS — N390 Urinary tract infection, site not specified: Secondary | ICD-10-CM | POA: Diagnosis not present

## 2019-03-04 DIAGNOSIS — S42034D Nondisplaced fracture of lateral end of right clavicle, subsequent encounter for fracture with routine healing: Secondary | ICD-10-CM | POA: Diagnosis not present

## 2019-03-04 DIAGNOSIS — R1312 Dysphagia, oropharyngeal phase: Secondary | ICD-10-CM | POA: Diagnosis not present

## 2019-03-04 DIAGNOSIS — I872 Venous insufficiency (chronic) (peripheral): Secondary | ICD-10-CM | POA: Diagnosis not present

## 2019-03-04 DIAGNOSIS — J9611 Chronic respiratory failure with hypoxia: Secondary | ICD-10-CM | POA: Diagnosis not present

## 2019-03-04 DIAGNOSIS — K5190 Ulcerative colitis, unspecified, without complications: Secondary | ICD-10-CM | POA: Diagnosis not present

## 2019-03-04 DIAGNOSIS — Z7901 Long term (current) use of anticoagulants: Secondary | ICD-10-CM | POA: Diagnosis not present

## 2019-03-04 DIAGNOSIS — N139 Obstructive and reflux uropathy, unspecified: Secondary | ICD-10-CM | POA: Diagnosis not present

## 2019-03-04 DIAGNOSIS — I480 Paroxysmal atrial fibrillation: Secondary | ICD-10-CM | POA: Diagnosis not present

## 2019-03-04 DIAGNOSIS — S2241XD Multiple fractures of ribs, right side, subsequent encounter for fracture with routine healing: Secondary | ICD-10-CM | POA: Diagnosis not present

## 2019-03-04 DIAGNOSIS — I10 Essential (primary) hypertension: Secondary | ICD-10-CM | POA: Diagnosis not present

## 2019-03-04 DIAGNOSIS — S32018D Other fracture of first lumbar vertebra, subsequent encounter for fracture with routine healing: Secondary | ICD-10-CM | POA: Diagnosis not present

## 2019-03-09 DIAGNOSIS — R1312 Dysphagia, oropharyngeal phase: Secondary | ICD-10-CM | POA: Diagnosis not present

## 2019-03-09 DIAGNOSIS — S2241XD Multiple fractures of ribs, right side, subsequent encounter for fracture with routine healing: Secondary | ICD-10-CM | POA: Diagnosis not present

## 2019-03-09 DIAGNOSIS — N139 Obstructive and reflux uropathy, unspecified: Secondary | ICD-10-CM | POA: Diagnosis not present

## 2019-03-09 DIAGNOSIS — K5190 Ulcerative colitis, unspecified, without complications: Secondary | ICD-10-CM | POA: Diagnosis not present

## 2019-03-09 DIAGNOSIS — N390 Urinary tract infection, site not specified: Secondary | ICD-10-CM | POA: Diagnosis not present

## 2019-03-09 DIAGNOSIS — I872 Venous insufficiency (chronic) (peripheral): Secondary | ICD-10-CM | POA: Diagnosis not present

## 2019-03-09 DIAGNOSIS — Z7901 Long term (current) use of anticoagulants: Secondary | ICD-10-CM | POA: Diagnosis not present

## 2019-03-09 DIAGNOSIS — I10 Essential (primary) hypertension: Secondary | ICD-10-CM | POA: Diagnosis not present

## 2019-03-09 DIAGNOSIS — F419 Anxiety disorder, unspecified: Secondary | ICD-10-CM | POA: Diagnosis not present

## 2019-03-09 DIAGNOSIS — S42034D Nondisplaced fracture of lateral end of right clavicle, subsequent encounter for fracture with routine healing: Secondary | ICD-10-CM | POA: Diagnosis not present

## 2019-03-09 DIAGNOSIS — I480 Paroxysmal atrial fibrillation: Secondary | ICD-10-CM | POA: Diagnosis not present

## 2019-03-09 DIAGNOSIS — S32018D Other fracture of first lumbar vertebra, subsequent encounter for fracture with routine healing: Secondary | ICD-10-CM | POA: Diagnosis not present

## 2019-03-09 DIAGNOSIS — J9611 Chronic respiratory failure with hypoxia: Secondary | ICD-10-CM | POA: Diagnosis not present

## 2019-03-09 DIAGNOSIS — J121 Respiratory syncytial virus pneumonia: Secondary | ICD-10-CM | POA: Diagnosis not present

## 2019-03-09 DIAGNOSIS — J449 Chronic obstructive pulmonary disease, unspecified: Secondary | ICD-10-CM | POA: Diagnosis not present

## 2019-03-10 DIAGNOSIS — S32018D Other fracture of first lumbar vertebra, subsequent encounter for fracture with routine healing: Secondary | ICD-10-CM | POA: Diagnosis not present

## 2019-03-10 DIAGNOSIS — N390 Urinary tract infection, site not specified: Secondary | ICD-10-CM | POA: Diagnosis not present

## 2019-03-10 DIAGNOSIS — J9611 Chronic respiratory failure with hypoxia: Secondary | ICD-10-CM | POA: Diagnosis not present

## 2019-03-10 DIAGNOSIS — S42034D Nondisplaced fracture of lateral end of right clavicle, subsequent encounter for fracture with routine healing: Secondary | ICD-10-CM | POA: Diagnosis not present

## 2019-03-10 DIAGNOSIS — Z7901 Long term (current) use of anticoagulants: Secondary | ICD-10-CM | POA: Diagnosis not present

## 2019-03-10 DIAGNOSIS — F419 Anxiety disorder, unspecified: Secondary | ICD-10-CM | POA: Diagnosis not present

## 2019-03-10 DIAGNOSIS — N139 Obstructive and reflux uropathy, unspecified: Secondary | ICD-10-CM | POA: Diagnosis not present

## 2019-03-10 DIAGNOSIS — J121 Respiratory syncytial virus pneumonia: Secondary | ICD-10-CM | POA: Diagnosis not present

## 2019-03-10 DIAGNOSIS — J449 Chronic obstructive pulmonary disease, unspecified: Secondary | ICD-10-CM | POA: Diagnosis not present

## 2019-03-10 DIAGNOSIS — K5190 Ulcerative colitis, unspecified, without complications: Secondary | ICD-10-CM | POA: Diagnosis not present

## 2019-03-10 DIAGNOSIS — R1312 Dysphagia, oropharyngeal phase: Secondary | ICD-10-CM | POA: Diagnosis not present

## 2019-03-10 DIAGNOSIS — I10 Essential (primary) hypertension: Secondary | ICD-10-CM | POA: Diagnosis not present

## 2019-03-10 DIAGNOSIS — R002 Palpitations: Secondary | ICD-10-CM | POA: Diagnosis not present

## 2019-03-10 DIAGNOSIS — S2241XD Multiple fractures of ribs, right side, subsequent encounter for fracture with routine healing: Secondary | ICD-10-CM | POA: Diagnosis not present

## 2019-03-10 DIAGNOSIS — I480 Paroxysmal atrial fibrillation: Secondary | ICD-10-CM | POA: Diagnosis not present

## 2019-03-10 DIAGNOSIS — I872 Venous insufficiency (chronic) (peripheral): Secondary | ICD-10-CM | POA: Diagnosis not present

## 2019-03-12 DIAGNOSIS — F419 Anxiety disorder, unspecified: Secondary | ICD-10-CM | POA: Diagnosis not present

## 2019-03-12 DIAGNOSIS — N390 Urinary tract infection, site not specified: Secondary | ICD-10-CM | POA: Diagnosis not present

## 2019-03-12 DIAGNOSIS — I10 Essential (primary) hypertension: Secondary | ICD-10-CM | POA: Diagnosis not present

## 2019-03-12 DIAGNOSIS — S32018D Other fracture of first lumbar vertebra, subsequent encounter for fracture with routine healing: Secondary | ICD-10-CM | POA: Diagnosis not present

## 2019-03-12 DIAGNOSIS — J121 Respiratory syncytial virus pneumonia: Secondary | ICD-10-CM | POA: Diagnosis not present

## 2019-03-12 DIAGNOSIS — Z7901 Long term (current) use of anticoagulants: Secondary | ICD-10-CM | POA: Diagnosis not present

## 2019-03-12 DIAGNOSIS — S2241XD Multiple fractures of ribs, right side, subsequent encounter for fracture with routine healing: Secondary | ICD-10-CM | POA: Diagnosis not present

## 2019-03-12 DIAGNOSIS — N139 Obstructive and reflux uropathy, unspecified: Secondary | ICD-10-CM | POA: Diagnosis not present

## 2019-03-12 DIAGNOSIS — J449 Chronic obstructive pulmonary disease, unspecified: Secondary | ICD-10-CM | POA: Diagnosis not present

## 2019-03-12 DIAGNOSIS — D649 Anemia, unspecified: Secondary | ICD-10-CM | POA: Diagnosis not present

## 2019-03-12 DIAGNOSIS — J9611 Chronic respiratory failure with hypoxia: Secondary | ICD-10-CM | POA: Diagnosis not present

## 2019-03-12 DIAGNOSIS — K5190 Ulcerative colitis, unspecified, without complications: Secondary | ICD-10-CM | POA: Diagnosis not present

## 2019-03-12 DIAGNOSIS — I872 Venous insufficiency (chronic) (peripheral): Secondary | ICD-10-CM | POA: Diagnosis not present

## 2019-03-12 DIAGNOSIS — S42034D Nondisplaced fracture of lateral end of right clavicle, subsequent encounter for fracture with routine healing: Secondary | ICD-10-CM | POA: Diagnosis not present

## 2019-03-12 DIAGNOSIS — I480 Paroxysmal atrial fibrillation: Secondary | ICD-10-CM | POA: Diagnosis not present

## 2019-03-12 DIAGNOSIS — E874 Mixed disorder of acid-base balance: Secondary | ICD-10-CM | POA: Diagnosis not present

## 2019-03-12 DIAGNOSIS — R1312 Dysphagia, oropharyngeal phase: Secondary | ICD-10-CM | POA: Diagnosis not present

## 2019-03-12 DIAGNOSIS — E039 Hypothyroidism, unspecified: Secondary | ICD-10-CM | POA: Diagnosis not present

## 2019-03-17 DIAGNOSIS — F419 Anxiety disorder, unspecified: Secondary | ICD-10-CM | POA: Diagnosis not present

## 2019-03-17 DIAGNOSIS — M545 Low back pain: Secondary | ICD-10-CM | POA: Diagnosis not present

## 2019-03-17 DIAGNOSIS — S2241XD Multiple fractures of ribs, right side, subsequent encounter for fracture with routine healing: Secondary | ICD-10-CM | POA: Diagnosis not present

## 2019-03-17 DIAGNOSIS — K5190 Ulcerative colitis, unspecified, without complications: Secondary | ICD-10-CM | POA: Diagnosis not present

## 2019-03-17 DIAGNOSIS — S42034D Nondisplaced fracture of lateral end of right clavicle, subsequent encounter for fracture with routine healing: Secondary | ICD-10-CM | POA: Diagnosis not present

## 2019-03-17 DIAGNOSIS — I872 Venous insufficiency (chronic) (peripheral): Secondary | ICD-10-CM | POA: Diagnosis not present

## 2019-03-17 DIAGNOSIS — Z0001 Encounter for general adult medical examination with abnormal findings: Secondary | ICD-10-CM | POA: Diagnosis not present

## 2019-03-17 DIAGNOSIS — J449 Chronic obstructive pulmonary disease, unspecified: Secondary | ICD-10-CM | POA: Diagnosis not present

## 2019-03-17 DIAGNOSIS — R358 Other polyuria: Secondary | ICD-10-CM | POA: Diagnosis not present

## 2019-03-17 DIAGNOSIS — J9611 Chronic respiratory failure with hypoxia: Secondary | ICD-10-CM | POA: Diagnosis not present

## 2019-03-17 DIAGNOSIS — N390 Urinary tract infection, site not specified: Secondary | ICD-10-CM | POA: Diagnosis not present

## 2019-03-17 DIAGNOSIS — I480 Paroxysmal atrial fibrillation: Secondary | ICD-10-CM | POA: Diagnosis not present

## 2019-03-17 DIAGNOSIS — I10 Essential (primary) hypertension: Secondary | ICD-10-CM | POA: Diagnosis not present

## 2019-03-17 DIAGNOSIS — S32018D Other fracture of first lumbar vertebra, subsequent encounter for fracture with routine healing: Secondary | ICD-10-CM | POA: Diagnosis not present

## 2019-03-17 DIAGNOSIS — Z7901 Long term (current) use of anticoagulants: Secondary | ICD-10-CM | POA: Diagnosis not present

## 2019-03-17 DIAGNOSIS — R1312 Dysphagia, oropharyngeal phase: Secondary | ICD-10-CM | POA: Diagnosis not present

## 2019-03-17 DIAGNOSIS — N139 Obstructive and reflux uropathy, unspecified: Secondary | ICD-10-CM | POA: Diagnosis not present

## 2019-03-17 DIAGNOSIS — J121 Respiratory syncytial virus pneumonia: Secondary | ICD-10-CM | POA: Diagnosis not present

## 2019-03-21 DIAGNOSIS — I480 Paroxysmal atrial fibrillation: Secondary | ICD-10-CM | POA: Diagnosis not present

## 2019-03-21 DIAGNOSIS — J449 Chronic obstructive pulmonary disease, unspecified: Secondary | ICD-10-CM | POA: Diagnosis not present

## 2019-03-21 DIAGNOSIS — N390 Urinary tract infection, site not specified: Secondary | ICD-10-CM | POA: Diagnosis not present

## 2019-03-21 DIAGNOSIS — I10 Essential (primary) hypertension: Secondary | ICD-10-CM | POA: Diagnosis not present

## 2019-03-22 DIAGNOSIS — L309 Dermatitis, unspecified: Secondary | ICD-10-CM | POA: Diagnosis not present

## 2019-03-22 DIAGNOSIS — E2740 Unspecified adrenocortical insufficiency: Secondary | ICD-10-CM | POA: Diagnosis not present

## 2019-03-22 DIAGNOSIS — K5150 Left sided colitis without complications: Secondary | ICD-10-CM | POA: Diagnosis not present

## 2019-03-22 DIAGNOSIS — R1011 Right upper quadrant pain: Secondary | ICD-10-CM | POA: Diagnosis not present

## 2019-03-29 DIAGNOSIS — R0901 Asphyxia: Secondary | ICD-10-CM | POA: Diagnosis not present

## 2019-03-29 DIAGNOSIS — I517 Cardiomegaly: Secondary | ICD-10-CM | POA: Diagnosis not present

## 2019-03-29 DIAGNOSIS — S22070A Wedge compression fracture of T9-T10 vertebra, initial encounter for closed fracture: Secondary | ICD-10-CM | POA: Diagnosis not present

## 2019-03-29 DIAGNOSIS — S2249XA Multiple fractures of ribs, unspecified side, initial encounter for closed fracture: Secondary | ICD-10-CM | POA: Diagnosis not present

## 2019-04-05 DIAGNOSIS — L8990 Pressure ulcer of unspecified site, unspecified stage: Secondary | ICD-10-CM | POA: Diagnosis not present

## 2019-04-05 DIAGNOSIS — R000 Tachycardia, unspecified: Secondary | ICD-10-CM | POA: Diagnosis not present

## 2019-04-05 DIAGNOSIS — K5190 Ulcerative colitis, unspecified, without complications: Secondary | ICD-10-CM | POA: Diagnosis not present

## 2019-04-05 DIAGNOSIS — I10 Essential (primary) hypertension: Secondary | ICD-10-CM | POA: Diagnosis not present

## 2019-04-07 DIAGNOSIS — R000 Tachycardia, unspecified: Secondary | ICD-10-CM | POA: Diagnosis not present

## 2019-04-07 DIAGNOSIS — R0602 Shortness of breath: Secondary | ICD-10-CM | POA: Diagnosis not present

## 2019-04-07 DIAGNOSIS — R42 Dizziness and giddiness: Secondary | ICD-10-CM | POA: Diagnosis not present

## 2019-04-07 DIAGNOSIS — L989 Disorder of the skin and subcutaneous tissue, unspecified: Secondary | ICD-10-CM | POA: Diagnosis not present

## 2019-04-15 ENCOUNTER — Other Ambulatory Visit (HOSPITAL_COMMUNITY): Payer: Self-pay | Admitting: Internal Medicine

## 2019-04-15 ENCOUNTER — Ambulatory Visit (HOSPITAL_COMMUNITY)
Admission: RE | Admit: 2019-04-15 | Discharge: 2019-04-15 | Disposition: A | Discharge status: Home / Self Care | Payer: BC Managed Care – PPO | Source: Ambulatory Visit | Attending: Internal Medicine | Admitting: Internal Medicine

## 2019-04-15 ENCOUNTER — Other Ambulatory Visit: Payer: Self-pay

## 2019-04-15 DIAGNOSIS — R0789 Other chest pain: Secondary | ICD-10-CM

## 2019-04-15 DIAGNOSIS — R079 Chest pain, unspecified: Secondary | ICD-10-CM | POA: Diagnosis not present

## 2019-04-16 DIAGNOSIS — Z86718 Personal history of other venous thrombosis and embolism: Secondary | ICD-10-CM | POA: Insufficient documentation

## 2019-04-16 DIAGNOSIS — I493 Ventricular premature depolarization: Secondary | ICD-10-CM | POA: Insufficient documentation

## 2019-04-16 DIAGNOSIS — I10 Essential (primary) hypertension: Secondary | ICD-10-CM | POA: Insufficient documentation
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
## 2019-04-19 ENCOUNTER — Telehealth: Payer: Self-pay | Admitting: Cardiology

## 2019-04-19 NOTE — Telephone Encounter (Signed)
I called pt to confirm appt for 04-20-19 with Dr Harrell Gave, no answer and no voicemail.

## 2019-04-19 NOTE — Telephone Encounter (Signed)
Called PCP office and LM to request records for pt upcoming appointment 7/28 with Dr. Harrell Gave. Asked to fax records to [phone] ATTN: Dr. Harrell Gave.

## 2019-04-20 ENCOUNTER — Ambulatory Visit: Payer: BC Managed Care – PPO | Admitting: Cardiology

## 2019-04-20 ENCOUNTER — Other Ambulatory Visit: Payer: Self-pay

## 2019-04-20 ENCOUNTER — Encounter: Payer: Self-pay | Admitting: Cardiology

## 2019-04-20 ENCOUNTER — Telehealth: Payer: Self-pay | Admitting: Radiology

## 2019-04-20 VITALS — BP 136/90 | HR 91 | Temp 97.9°F | Ht 60.0 in | Wt 145.0 lb

## 2019-04-20 DIAGNOSIS — J449 Chronic obstructive pulmonary disease, unspecified: Secondary | ICD-10-CM | POA: Diagnosis not present

## 2019-04-20 DIAGNOSIS — R0602 Shortness of breath: Secondary | ICD-10-CM | POA: Diagnosis not present

## 2019-04-20 DIAGNOSIS — R000 Tachycardia, unspecified: Secondary | ICD-10-CM | POA: Diagnosis not present

## 2019-04-20 DIAGNOSIS — I10 Essential (primary) hypertension: Secondary | ICD-10-CM | POA: Diagnosis not present

## 2019-04-20 DIAGNOSIS — Z7189 Other specified counseling: Secondary | ICD-10-CM | POA: Diagnosis not present

## 2019-04-20 NOTE — Patient Instructions (Addendum)
Medication Instructions:  Your Physician recommend you continue on your current medication as directed.    If you need a refill on your cardiac medications before your next appointment, please call your pharmacy.   Lab work: None  Testing/Procedures: Our physician has recommended that you wear an 7  DAY ZIO-PATCH monitor. The Zio patch cardiac monitor continuously records heart rhythm data for up to 14 days, this is for patients being evaluated for multiple types heart rhythms. For the first 24 hours post application, please avoid getting the Zio monitor wet in the shower or by excessive sweating during exercise. After that, feel free to carry on with regular activities. Keep soaps and lotions away from the ZIO XT Patch.   This will be placed at our [address] location - [address], [address].      Follow-Up: At [facility], you and your health needs are our priority.  As part of our continuing mission to provide you with exceptional heart care, we have created designated Provider Care Teams.  These Care Teams include your primary Cardiologist (physician) and Advanced Practice Providers (APPs -  Physician Assistants and Nurse Practitioners) who all work together to provide you with the care you need, when you need it. You will need a follow up appointment in 3 months.  Please call our office 2 months in advance to schedule this appointment.  You may see Dr. Christopher or one of the following Advanced Practice Providers on your designated Care Team:   Rhonda Barrett, PA-C . Kathryn Lawrence, DNP, ANP     

## 2019-04-20 NOTE — Telephone Encounter (Signed)
Enrolled patient for a 7 day Zio monitor to be mailed. Brief instructions were gone over with patient and she knows to expect the monitor to arrive in 3-4 days 
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
## 2019-04-22 ENCOUNTER — Ambulatory Visit: Payer: Self-pay | Admitting: Cardiology

## 2019-04-23 ENCOUNTER — Encounter: Payer: Self-pay | Admitting: Cardiology

## 2019-04-26 ENCOUNTER — Ambulatory Visit (HOSPITAL_COMMUNITY)
Admission: RE | Admit: 2019-04-26 | Discharge: 2019-04-26 | Disposition: A | Discharge status: Home / Self Care | Payer: BC Managed Care – PPO | Source: Ambulatory Visit | Attending: Internal Medicine | Admitting: Internal Medicine

## 2019-04-26 ENCOUNTER — Ambulatory Visit (INDEPENDENT_AMBULATORY_CARE_PROVIDER_SITE_OTHER): Payer: BC Managed Care – PPO

## 2019-04-26 ENCOUNTER — Other Ambulatory Visit: Payer: Self-pay | Admitting: Internal Medicine

## 2019-04-26 ENCOUNTER — Other Ambulatory Visit (HOSPITAL_COMMUNITY): Payer: Self-pay | Admitting: Internal Medicine

## 2019-04-26 ENCOUNTER — Other Ambulatory Visit: Payer: Self-pay

## 2019-04-26 DIAGNOSIS — Z86718 Personal history of other venous thrombosis and embolism: Secondary | ICD-10-CM | POA: Diagnosis not present

## 2019-04-26 DIAGNOSIS — R600 Localized edema: Secondary | ICD-10-CM | POA: Diagnosis not present

## 2019-04-26 DIAGNOSIS — R000 Tachycardia, unspecified: Secondary | ICD-10-CM | POA: Diagnosis not present

## 2019-04-26 DIAGNOSIS — R0602 Shortness of breath: Secondary | ICD-10-CM | POA: Diagnosis not present

## 2019-04-26 DIAGNOSIS — R0600 Dyspnea, unspecified: Secondary | ICD-10-CM | POA: Diagnosis not present

## 2019-04-26 DIAGNOSIS — R1010 Upper abdominal pain, unspecified: Secondary | ICD-10-CM | POA: Diagnosis not present

## 2019-04-26 LAB — POCT I-STAT CREATININE: Creatinine, Ser: 0.6 mg/dL (ref 0.44–1.00)

## 2019-04-26 MED ORDER — IOHEXOL 350 MG/ML SOLN
100.0000 mL | Freq: Once | INTRAVENOUS | Status: AC | PRN
  Administered 2019-04-26: 100 mL via INTRAVENOUS

## 2019-04-27 ENCOUNTER — Other Ambulatory Visit: Payer: Self-pay | Admitting: Internal Medicine

## 2019-04-27 DIAGNOSIS — R1010 Upper abdominal pain, unspecified: Secondary | ICD-10-CM

## 2019-05-05 ENCOUNTER — Encounter (HOSPITAL_COMMUNITY): Payer: BC Managed Care – PPO

## 2019-05-06 ENCOUNTER — Other Ambulatory Visit: Payer: Self-pay

## 2019-05-06 ENCOUNTER — Encounter: Payer: Self-pay | Admitting: Pulmonary Disease

## 2019-05-06 ENCOUNTER — Ambulatory Visit: Payer: BC Managed Care – PPO | Admitting: Pulmonary Disease

## 2019-05-06 VITALS — BP 110/88 | HR 97 | Temp 98.5°F | Ht 61.0 in | Wt 150.2 lb

## 2019-05-06 DIAGNOSIS — Z7952 Long term (current) use of systemic steroids: Secondary | ICD-10-CM

## 2019-05-06 DIAGNOSIS — S2249XS Multiple fractures of ribs, unspecified side, sequela: Secondary | ICD-10-CM

## 2019-05-06 DIAGNOSIS — J984 Other disorders of lung: Secondary | ICD-10-CM

## 2019-05-06 DIAGNOSIS — R0602 Shortness of breath: Secondary | ICD-10-CM

## 2019-05-06 DIAGNOSIS — S22000S Wedge compression fracture of unspecified thoracic vertebra, sequela: Secondary | ICD-10-CM | POA: Diagnosis not present

## 2019-05-06 DIAGNOSIS — J9611 Chronic respiratory failure with hypoxia: Secondary | ICD-10-CM

## 2019-05-06 DIAGNOSIS — J9811 Atelectasis: Secondary | ICD-10-CM

## 2019-05-06 DIAGNOSIS — T380X5A Adverse effect of glucocorticoids and synthetic analogues, initial encounter: Secondary | ICD-10-CM

## 2019-05-06 NOTE — Patient Instructions (Addendum)
Thank you for visiting Dr. Valeta Harms at [facility]. Today we recommend the following:  We will be happy to order PFTs if you would like. Please call our office anytime to schedule.  Return in about 3 months (around 08/06/2019), or if symptoms worsen or fail to improve, for with APP or Dr. Valeta Harms.    Please do your part to reduce the spread of COVID-19.
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
## 2019-05-08 ENCOUNTER — Inpatient Hospital Stay (HOSPITAL_COMMUNITY): Payer: BC Managed Care – PPO

## 2019-05-08 ENCOUNTER — Inpatient Hospital Stay (HOSPITAL_COMMUNITY)
Admission: EM | Admit: 2019-05-08 | Discharge: 2019-05-14 | DRG: 871 | Disposition: A | Discharge status: Home Health | Payer: BC Managed Care – PPO | Attending: Internal Medicine | Admitting: Internal Medicine

## 2019-05-08 ENCOUNTER — Emergency Department (HOSPITAL_COMMUNITY): Payer: BC Managed Care – PPO

## 2019-05-08 ENCOUNTER — Other Ambulatory Visit: Payer: Self-pay

## 2019-05-08 ENCOUNTER — Encounter (HOSPITAL_COMMUNITY): Payer: Self-pay | Admitting: Emergency Medicine

## 2019-05-08 DIAGNOSIS — J189 Pneumonia, unspecified organism: Secondary | ICD-10-CM

## 2019-05-08 DIAGNOSIS — Z7901 Long term (current) use of anticoagulants: Secondary | ICD-10-CM

## 2019-05-08 DIAGNOSIS — L89892 Pressure ulcer of other site, stage 2: Secondary | ICD-10-CM | POA: Diagnosis not present

## 2019-05-08 DIAGNOSIS — K5190 Ulcerative colitis, unspecified, without complications: Secondary | ICD-10-CM | POA: Diagnosis not present

## 2019-05-08 DIAGNOSIS — Z20828 Contact with and (suspected) exposure to other viral communicable diseases: Secondary | ICD-10-CM | POA: Diagnosis present

## 2019-05-08 DIAGNOSIS — J9621 Acute and chronic respiratory failure with hypoxia: Secondary | ICD-10-CM | POA: Diagnosis not present

## 2019-05-08 DIAGNOSIS — J181 Lobar pneumonia, unspecified organism: Secondary | ICD-10-CM | POA: Diagnosis not present

## 2019-05-08 DIAGNOSIS — F419 Anxiety disorder, unspecified: Secondary | ICD-10-CM | POA: Diagnosis present

## 2019-05-08 DIAGNOSIS — L8990 Pressure ulcer of unspecified site, unspecified stage: Secondary | ICD-10-CM | POA: Insufficient documentation

## 2019-05-08 DIAGNOSIS — M7989 Other specified soft tissue disorders: Secondary | ICD-10-CM | POA: Diagnosis not present

## 2019-05-08 DIAGNOSIS — I480 Paroxysmal atrial fibrillation: Secondary | ICD-10-CM | POA: Diagnosis present

## 2019-05-08 DIAGNOSIS — I872 Venous insufficiency (chronic) (peripheral): Secondary | ICD-10-CM | POA: Diagnosis present

## 2019-05-08 DIAGNOSIS — Z79899 Other long term (current) drug therapy: Secondary | ICD-10-CM

## 2019-05-08 DIAGNOSIS — Z882 Allergy status to sulfonamides status: Secondary | ICD-10-CM

## 2019-05-08 DIAGNOSIS — G8929 Other chronic pain: Secondary | ICD-10-CM | POA: Diagnosis present

## 2019-05-08 DIAGNOSIS — K219 Gastro-esophageal reflux disease without esophagitis: Secondary | ICD-10-CM | POA: Diagnosis present

## 2019-05-08 DIAGNOSIS — Z7189 Other specified counseling: Secondary | ICD-10-CM

## 2019-05-08 DIAGNOSIS — R600 Localized edema: Secondary | ICD-10-CM | POA: Diagnosis not present

## 2019-05-08 DIAGNOSIS — Z86718 Personal history of other venous thrombosis and embolism: Secondary | ICD-10-CM | POA: Diagnosis not present

## 2019-05-08 DIAGNOSIS — M4854XA Collapsed vertebra, not elsewhere classified, thoracic region, initial encounter for fracture: Secondary | ICD-10-CM | POA: Diagnosis present

## 2019-05-08 DIAGNOSIS — Z880 Allergy status to penicillin: Secondary | ICD-10-CM

## 2019-05-08 DIAGNOSIS — M79605 Pain in left leg: Secondary | ICD-10-CM | POA: Diagnosis not present

## 2019-05-08 DIAGNOSIS — S22000A Wedge compression fracture of unspecified thoracic vertebra, initial encounter for closed fracture: Secondary | ICD-10-CM | POA: Diagnosis not present

## 2019-05-08 DIAGNOSIS — E876 Hypokalemia: Secondary | ICD-10-CM | POA: Diagnosis present

## 2019-05-08 DIAGNOSIS — M79606 Pain in leg, unspecified: Secondary | ICD-10-CM

## 2019-05-08 DIAGNOSIS — R000 Tachycardia, unspecified: Secondary | ICD-10-CM | POA: Diagnosis not present

## 2019-05-08 DIAGNOSIS — I081 Rheumatic disorders of both mitral and tricuspid valves: Secondary | ICD-10-CM | POA: Diagnosis present

## 2019-05-08 DIAGNOSIS — A419 Sepsis, unspecified organism: Secondary | ICD-10-CM | POA: Diagnosis not present

## 2019-05-08 DIAGNOSIS — E2740 Unspecified adrenocortical insufficiency: Secondary | ICD-10-CM | POA: Diagnosis not present

## 2019-05-08 DIAGNOSIS — I517 Cardiomegaly: Secondary | ICD-10-CM | POA: Diagnosis not present

## 2019-05-08 DIAGNOSIS — L89322 Pressure ulcer of left buttock, stage 2: Secondary | ICD-10-CM | POA: Diagnosis not present

## 2019-05-08 DIAGNOSIS — I10 Essential (primary) hypertension: Secondary | ICD-10-CM | POA: Diagnosis present

## 2019-05-08 DIAGNOSIS — Y95 Nosocomial condition: Secondary | ICD-10-CM | POA: Diagnosis present

## 2019-05-08 DIAGNOSIS — Z209 Contact with and (suspected) exposure to unspecified communicable disease: Secondary | ICD-10-CM | POA: Diagnosis not present

## 2019-05-08 DIAGNOSIS — R339 Retention of urine, unspecified: Secondary | ICD-10-CM | POA: Diagnosis present

## 2019-05-08 DIAGNOSIS — Z9981 Dependence on supplemental oxygen: Secondary | ICD-10-CM | POA: Diagnosis not present

## 2019-05-08 DIAGNOSIS — L03116 Cellulitis of left lower limb: Secondary | ICD-10-CM | POA: Diagnosis present

## 2019-05-08 DIAGNOSIS — Z7952 Long term (current) use of systemic steroids: Secondary | ICD-10-CM | POA: Diagnosis not present

## 2019-05-08 DIAGNOSIS — D638 Anemia in other chronic diseases classified elsewhere: Secondary | ICD-10-CM | POA: Diagnosis present

## 2019-05-08 DIAGNOSIS — J90 Pleural effusion, not elsewhere classified: Secondary | ICD-10-CM | POA: Diagnosis not present

## 2019-05-08 DIAGNOSIS — Z7951 Long term (current) use of inhaled steroids: Secondary | ICD-10-CM

## 2019-05-08 DIAGNOSIS — I5031 Acute diastolic (congestive) heart failure: Secondary | ICD-10-CM | POA: Diagnosis present

## 2019-05-08 DIAGNOSIS — I110 Hypertensive heart disease with heart failure: Secondary | ICD-10-CM | POA: Diagnosis present

## 2019-05-08 DIAGNOSIS — Z888 Allergy status to other drugs, medicaments and biological substances status: Secondary | ICD-10-CM

## 2019-05-08 DIAGNOSIS — R531 Weakness: Secondary | ICD-10-CM | POA: Diagnosis not present

## 2019-05-08 DIAGNOSIS — Z515 Encounter for palliative care: Secondary | ICD-10-CM | POA: Diagnosis not present

## 2019-05-08 DIAGNOSIS — Z885 Allergy status to narcotic agent status: Secondary | ICD-10-CM

## 2019-05-08 HISTORY — DX: Paroxysmal atrial fibrillation: I48.0

## 2019-05-08 LAB — COMPREHENSIVE METABOLIC PANEL WITH GFR
ALT: 22 U/L (ref 0–44)
AST: 27 U/L (ref 15–41)
Albumin: 3.1 g/dL — ABNORMAL LOW (ref 3.5–5.0)
Alkaline Phosphatase: 156 U/L — ABNORMAL HIGH (ref 38–126)
Anion gap: 13 (ref 5–15)
BUN: 10 mg/dL (ref 8–23)
CO2: 25 mmol/L (ref 22–32)
Calcium: 8.5 mg/dL — ABNORMAL LOW (ref 8.9–10.3)
Chloride: 102 mmol/L (ref 98–111)
Creatinine, Ser: 0.58 mg/dL (ref 0.44–1.00)
GFR calc Af Amer: >60 mL/min
GFR calc non Af Amer: >60 mL/min
Glucose, Bld: 105 mg/dL — ABNORMAL HIGH (ref 70–99)
Potassium: 3.3 mmol/L — ABNORMAL LOW (ref 3.5–5.1)
Sodium: 140 mmol/L (ref 135–145)
Total Bilirubin: 0.5 mg/dL (ref 0.3–1.2)
Total Protein: 6.2 g/dL — ABNORMAL LOW (ref 6.5–8.1)

## 2019-05-08 LAB — CBC
HCT: 41.7 % (ref 36.0–46.0)
Hemoglobin: 13.3 g/dL (ref 12.0–15.0)
MCH: 29.6 pg (ref 26.0–34.0)
MCHC: 31.9 g/dL (ref 30.0–36.0)
MCV: 92.7 fL (ref 80.0–100.0)
Platelets: 266 K/uL (ref 150–400)
RBC: 4.5 MIL/uL (ref 3.87–5.11)
RDW: 15.7 % — ABNORMAL HIGH (ref 11.5–15.5)
WBC: 16.9 K/uL — ABNORMAL HIGH (ref 4.0–10.5)
nRBC: 0.1 % (ref 0.0–0.2)

## 2019-05-08 LAB — BRAIN NATRIURETIC PEPTIDE: B Natriuretic Peptide: 112 pg/mL — ABNORMAL HIGH (ref 0.0–100.0)

## 2019-05-08 LAB — DIFFERENTIAL
Basophils Absolute: 0.1 10*3/uL (ref 0.0–0.1)
Basophils Relative: 0 %
Eosinophils Absolute: 0 10*3/uL (ref 0.0–0.5)
Eosinophils Relative: 0 %
Lymphocytes Relative: 5 %
Lymphs Abs: 0.9 10*3/uL (ref 0.7–4.0)
Monocytes Absolute: 0.5 10*3/uL (ref 0.1–1.0)
Monocytes Relative: 3 %
Neutro Abs: 15 10*3/uL — ABNORMAL HIGH (ref 1.7–7.7)
Neutrophils Relative %: 89 %

## 2019-05-08 LAB — APTT: aPTT: 25 seconds (ref 24–36)

## 2019-05-08 LAB — PROTIME-INR
INR: 1.1 (ref 0.8–1.2)
Prothrombin Time: 13.8 seconds (ref 11.4–15.2)

## 2019-05-08 LAB — LACTIC ACID, PLASMA: Lactic Acid, Venous: 3.7 mmol/L (ref 0.5–1.9)

## 2019-05-08 MED ORDER — ACETAMINOPHEN 650 MG RE SUPP: 650.0000 mg | Freq: Four times a day (QID) | RECTAL | Status: DC | PRN

## 2019-05-08 MED ORDER — METOPROLOL TARTRATE 5 MG/5ML IV SOLN
5.0000 mg | Freq: Once | INTRAVENOUS | Status: AC
  Administered 2019-05-08: 5 mg via INTRAVENOUS
  Filled 2019-05-08: qty 5

## 2019-05-08 MED ORDER — SODIUM CHLORIDE 0.9 % IV BOLUS
500.0000 mL | Freq: Once | INTRAVENOUS | Status: AC
  Administered 2019-05-08: 500 mL via INTRAVENOUS

## 2019-05-08 MED ORDER — LEVOFLOXACIN IN D5W 750 MG/150ML IV SOLN
750.0000 mg | Freq: Once | INTRAVENOUS | Status: AC
  Administered 2019-05-09: 750 mg via INTRAVENOUS
  Filled 2019-05-08: qty 150

## 2019-05-08 MED ORDER — POTASSIUM CHLORIDE IN NACL 20-0.9 MEQ/L-% IV SOLN
INTRAVENOUS | Status: DC
  Filled 2019-05-08: qty 1000

## 2019-05-08 MED ORDER — HYDROCORTISONE NA SUCCINATE PF 100 MG IJ SOLR
100.0000 mg | Freq: Once | INTRAMUSCULAR | Status: AC
  Administered 2019-05-08: 22:00:00 100 mg via INTRAVENOUS
  Filled 2019-05-08: qty 2

## 2019-05-08 MED ORDER — ACETAMINOPHEN 325 MG PO TABS: 650.0000 mg | ORAL_TABLET | Freq: Four times a day (QID) | ORAL | Status: DC | PRN

## 2019-05-08 MED ORDER — VANCOMYCIN HCL IN DEXTROSE 1-5 GM/200ML-% IV SOLN
1000.0000 mg | Freq: Once | INTRAVENOUS | Status: AC
  Administered 2019-05-08: 1000 mg via INTRAVENOUS
  Filled 2019-05-08: qty 200

## 2019-05-08 NOTE — ED Provider Notes (Signed)
[facility] EMERGENCY DEPARTMENT Provider Note   CSN: [id number] Arrival date & time: 05/08/19  1918     History   Chief Complaint Chief Complaint  Patient presents with  . Leg Pain    HPI Betty Bryan is a 63 y.o. female.     Patient has history of ulcer colitis and takes steroids.  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  Patient complains of swelling to her legs and pain in left leg  The history is provided by the patient. No language interpreter was used.  Leg Pain Location:  Leg Injury: no   Leg location:  L leg Pain details:    Quality:  Aching   Radiates to:  Does not radiate   Severity:  Moderate   Onset quality:  Sudden   Timing:  Constant   Progression:  Worsening Chronicity:  New Dislocation: no   Associated symptoms: no back pain and no fatigue     Past Medical History:  Diagnosis Date  . Adrenal insufficiency ([place])   . HTN (hypertension)   . L4 vertebral fracture ([facility])    L4-L5 fracture  . Palpitations   . PVC's (premature ventricular contractions)   . Ulcerative colitis     Patient Active Problem List   Diagnosis Date Noted  . Sepsis ([place]) 05/08/2019  . Essential hypertension 04/16/2019  . PVC's (premature ventricular contractions) 04/16/2019  . History of DVT (deep vein thrombosis) 04/16/2019  . Chronic respiratory failure with hypoxia ([place]) 01/29/2019  . AF (paroxysmal atrial fibrillation) ([place]) 01/29/2019  . Hypomagnesemia   . Swelling of both lower extremities   . Hypokalemia 01/28/2019  . Pain 12/23/2018  . Fall at home, initial encounter 12/23/2018  . Multiple rib fractures 12/23/2018  . Clavicle fracture 12/23/2018  . PNA (pneumonia) 12/23/2018  . Rib pain on right side 01/05/2018  . Varicose veins of leg with complications 40/98/1191  . Chronic venous insufficiency 06/23/2015    Past Surgical History:  Procedure Laterality Date  . HAND SURGERY    . NASAL SINUS SURGERY       OB History   No obstetric  history on file.      Home Medications    Prior to Admission medications   Medication Sig Start Date End Date Taking? Authorizing Provider  atenolol (TENORMIN) 50 MG tablet Take 25 mg by mouth 2 (two) times daily.     [provider]  balsalazide (COLAZAL) 750 MG capsule Take 2,250 mg by mouth 3 (three) times daily.    [provider]  calcium-vitamin D (OSCAL WITH D) 500-200 MG-UNIT tablet Take 1 tablet by mouth 3 (three) times daily. 11/10/18   Leandrew Koyanagi, MD  cholecalciferol (VITAMIN D3) 25 MCG (1000 UT) tablet Take 1,000 Units by mouth daily.    [provider]  Cyanocobalamin (B-12 PO) Take 2,000 mcg by mouth daily.     [provider]  diazepam (VALIUM) 5 MG tablet Take 0.5 tablets (2.5 mg total) by mouth every 12 (twelve) hours as needed for anxiety or muscle spasms. Patient taking differently: Take 1.25 mg by mouth 2 (two) times a day.  12/31/18   Manuella Ghazi, Pratik D, DO  ELIQUIS 2.5 MG TABS tablet Take 2.5 mg by mouth 2 (two) times daily.  04/28/18   [provider]  fluticasone (FLONASE) 50 MCG/ACT nasal spray Place 1 spray into both nostrils 2 (two) times daily.     [provider]  furosemide (LASIX) 20 MG tablet  Take 1 tablet (20 mg total) by mouth daily. 01/30/19   Orson Eva, MD  gabapentin (NEURONTIN) 100 MG capsule Take 100-300 mg by mouth 3 (three) times daily as needed (for nerve pain).     [provider]  HYDROCORTISONE ACE, RECTAL, 30 MG SUPP Place 1 suppository rectally at bedtime.  06/08/15   [provider]  hyoscyamine (LEVSIN, ANASPAZ) 0.125 MG tablet Take 0.125 mg by mouth every 6 (six) hours as needed for bladder spasms (and/or colon spasms).  08/18/18   [provider]  loratadine (CLARITIN) 10 MG tablet Take 10 mg by mouth daily.    [provider]  nystatin cream (MYCOSTATIN) Apply 1 application topically daily as needed for dry skin (and/or skin rash).  05/08/18   [provider]  oxyCODONE-acetaminophen (PERCOCET/ROXICET) 5-325 MG tablet Take 1-2 tablets by mouth every 6 (six) hours as needed for moderate pain or severe pain. 12/31/18   Manuella Ghazi, Pratik D, DO  pantoprazole (PROTONIX) 20 MG tablet Take 20 mg by mouth daily.    [provider]    Family History Family History  Problem Relation Age of Onset  . Heart failure Other   . Diabetes Father   . Heart disease Father   . Hypertension Father     Social History Social History   Tobacco Use  . Smoking status: Never Smoker  . Smokeless tobacco: Never Used  Substance Use Topics  . Alcohol use: No    Alcohol/week: 0.0 standard drinks  . Drug use: No     Allergies   Penicillins, Sulfa antibiotics, Morphine and related, and Voltaren [diclofenac]   Review of Systems Review of Systems  Constitutional: Negative for appetite change and fatigue.  HENT: Negative for congestion, ear discharge and sinus pressure.   Eyes: Negative for discharge.  Respiratory: Negative for cough.   Cardiovascular: Negative for chest pain.  Gastrointestinal: Negative for abdominal pain and diarrhea.  Genitourinary: Negative for frequency and hematuria.  Musculoskeletal: Negative for back pain.       Left leg pain  Skin: Negative for rash.  Neurological: Negative for seizures and headaches.  Psychiatric/Behavioral: Negative for hallucinations.     Physical Exam Updated Vital Signs BP (!) 138/92   Pulse (!) 136   Temp 98.2 F (36.8 C) (Oral)   Resp (!) 34   Ht 5\' 1"  (1.549 m)   Wt 68.1 kg   SpO2 95%   BMI 28.38 kg/m   Physical Exam Vitals signs and nursing note reviewed.  Constitutional:      Appearance: She is well-developed.  HENT:     Head: Normocephalic.     Mouth/Throat:     Mouth: Mucous membranes are moist.  Eyes:     General: No scleral icterus.    Conjunctiva/sclera: Conjunctivae normal.  Neck:     Musculoskeletal: Neck supple.     Thyroid: No thyromegaly.  Cardiovascular:      Rate and Rhythm: Normal rate and regular rhythm.     Heart sounds: No murmur. No friction rub. No gallop.   Pulmonary:     Breath sounds: No stridor. No wheezing or rales.  Chest:     Chest wall: No tenderness.  Abdominal:     General: There is no distension.     Tenderness: There is no abdominal tenderness. There is no rebound.  Musculoskeletal: Normal range of motion.     Comments: Swollen tender left leg with swelling.  She also has redness.  Lymphadenopathy:  Cervical: No cervical adenopathy.  Skin:    Findings: No erythema or rash.  Neurological:     Mental Status: She is oriented to person, place, and time.     Motor: No abnormal muscle tone.     Coordination: Coordination normal.  Psychiatric:        Behavior: Behavior normal.      ED Treatments / Results  Labs (all labs ordered are listed, but only abnormal results are displayed) Labs Reviewed  CBC - Abnormal; Notable for the following components:      Result Value   WBC 16.9 (*)    RDW 15.7 (*)    All other components within normal limits  COMPREHENSIVE METABOLIC PANEL - Abnormal; Notable for the following components:   Potassium 3.3 (*)    Glucose, Bld 105 (*)    Calcium 8.5 (*)    Total Protein 6.2 (*)    Albumin 3.1 (*)    Alkaline Phosphatase 156 (*)    All other components within normal limits  LACTIC ACID, PLASMA - Abnormal; Notable for the following components:   Lactic Acid, Venous 3.7 (*)    All other components within normal limits  BRAIN NATRIURETIC PEPTIDE - Abnormal; Notable for the following components:   B Natriuretic Peptide 112.0 (*)    All other components within normal limits  DIFFERENTIAL - Abnormal; Notable for the following components:   Neutro Abs 15.0 (*)    All other components within normal limits  CULTURE, BLOOD (ROUTINE X 2)  CULTURE, BLOOD (ROUTINE X 2)  URINE CULTURE  APTT  PROTIME-INR  LACTIC ACID, PLASMA  URINALYSIS, ROUTINE W REFLEX MICROSCOPIC  COMPREHENSIVE  METABOLIC PANEL  CBC    EKG None  Radiology Dg Chest Port 1 View  Result Date: 05/08/2019 CLINICAL DATA:  Tachycardia EXAM: PORTABLE CHEST 1 VIEW COMPARISON:  April 15, 2019 FINDINGS: The heart size is enlarged. The lung volumes are low. There are airspace opacities throughout both lung fields, greatest at the left lung base. There is no definite acute osseous abnormality. No pneumothorax. The heart size appears enlarged but is likely stable from prior study. IMPRESSION: 1. Low lung volumes 2. Scattered airspace opacities, greatest at the left lung base, concerning for multifocal pneumonia. There may be a small to moderate-sized left-sided pleural effusion. 3. Prominent interstitial lung markings suggestive volume overload with possible developing pulmonary edema. Electronically Signed   By: Constance Holster M.D.   On: 05/08/2019 21:50    Procedures Procedures (including critical care time)  Medications Ordered in ED Medications  vancomycin (VANCOCIN) IVPB 1000 mg/200 mL premix (1,000 mg Intravenous New Bag/Given 05/08/19 2208)  0.9 % NaCl with KCl 20 mEq/ L  infusion (has no administration in time range)  acetaminophen (TYLENOL) tablet 650 mg (has no administration in time range)    Or  acetaminophen (TYLENOL) suppository 650 mg (has no administration in time range)  sodium chloride 0.9 % bolus 500 mL (500 mLs Intravenous New Bag/Given 05/08/19 2120)  metoprolol tartrate (LOPRESSOR) injection 5 mg (5 mg Intravenous Given 05/08/19 2154)  hydrocortisone sodium succinate (SOLU-CORTEF) 100 MG injection 100 mg (100 mg Intravenous Given 05/08/19 2203)     Initial Impression / Assessment and Plan / ED Course  I have reviewed the triage vital signs and the nursing notes.  Pertinent labs & imaging results that were available during my care of the patient were reviewed by me and considered in my medical decision making (see chart for details). Patient is septic from  cellulitis to left leg.  She  started on antibiotics and admitted to medicine   CRITICAL CARE Performed by: Milton Ferguson Total critical care time: 40 minutes Critical care time was exclusive of separately billable procedures and treating other patients. Critical care was necessary to treat or prevent imminent or life-threatening deterioration. Critical care was time spent personally by me on the following activities: development of treatment plan with patient and/or surrogate as well as nursing, discussions with consultants, evaluation of patient's response to treatment, examination of patient, obtaining history from patient or surrogate, ordering and performing treatments and interventions, ordering and review of laboratory studies, ordering and review of radiographic studies, pulse oximetry and re-evaluation of patient's condition.      Final Clinical Impressions(s) / ED Diagnoses   Final diagnoses:  Acute sepsis [facility])    ED Discharge Orders    None       Milton Ferguson, MD 05/08/19 2248

## 2019-05-08 NOTE — ED Notes (Signed)
Critical Lactic acid 3.7  RN and EDP informed

## 2019-05-08 NOTE — ED Triage Notes (Signed)
Pt from home via [facility]. Pt C/O left leg pain and "feeling cold."

## 2019-05-08 NOTE — H&P (Addendum)
TRH H&P    Patient Demographics:    Betty Bryan, is a 63 y.o. female  MRN: [id number]  DOB - September 05, 1956  Admit Date - 05/08/2019  Referring MD/NP/PA:  [place]  Outpatient Primary MD for the patient is Crist Infante, MD [facility] - [facility]  Patient coming from:  home  Chief complaint- left leg pain     HPI:    Betty Bryan  is a 63 y.o. female,w history of adrenal insufficiency, T6, T9 compression fracture, paroxysmal atrial fibrillation, DVT, hypertension, ulcerative colitis on chronic prednisone, and  presenting with left leg pain, redness for a few days.   Pt denies fever, chills, cough, cp, palp, sob, n/v, abd pain, diarrhea, brbpr, black stool, dysuria, hematuria. Pt denies covid exposure.    In ED,  T 98.2, P 138, R 20, Bp 134/89 pox 95% on RA Wt 68.1 kg  CXR IMPRESSION: 1. Low lung volumes 2. Scattered airspace opacities, greatest at the left lung base, concerning for multifocal pneumonia. There may be a small to moderate-sized left-sided pleural effusion. 3. Prominent interstitial lung markings suggestive volume overload with possible developing pulmonary edema.  Wbc 16.9, Hgb 13.3, Plt 266 Na 140, K 3.3, Bun 10, creatinine 0.58 Alb 3.1 Ast 27, Alt 22, Alk phos 156, T. Bili 0.5 Lactic acid 3.7 INR 1.1, Ptt 25  Urinalysis pending Blood culture pending  EKG pending  Pt will be admitted for sepsis (tachycardia, leukocytosis, elevated lactic acid) of unclear source, ? Pneumonia (cap)     Review of systems:    In addition to the HPI above,  No Fever-chills, No Headache, No changes with Vision or hearing, No problems swallowing food or Liquids, No Chest pain, Cough or Shortness of Breath, No Abdominal pain, No Nausea or Vomiting, bowel movements are regular, No Blood in stool or Urine, No dysuria,  No new joints pains-aches,  No new weakness, tingling, numbness in any  extremity, No recent weight gain or loss, No polyuria, polydypsia or polyphagia, No significant Mental Stressors.  All other systems reviewed and are negative.    Past History of the following :    Past Medical History:  Diagnosis Date  . Adrenal insufficiency ([place])   . HTN (hypertension)   . L4 vertebral fracture ([facility])    L4-L5 fracture  . Palpitations   . PVC's (premature ventricular contractions)   . Ulcerative colitis       Past Surgical History:  Procedure Laterality Date  . HAND SURGERY    . NASAL SINUS SURGERY        Social History:      Social History   Tobacco Use  . Smoking status: Never Smoker  . Smokeless tobacco: Never Used  Substance Use Topics  . Alcohol use: No    Alcohol/week: 0.0 standard drinks       Family History :     Family History  Problem Relation Age of Onset  . Heart failure Other   . Diabetes Father   . Heart disease Father   . Hypertension Father  Home Medications:   Prior to Admission medications   Medication Sig Start Date End Date Taking? Authorizing Provider  atenolol (TENORMIN) 50 MG tablet Take 25 mg by mouth 2 (two) times daily.     [provider]  balsalazide (COLAZAL) 750 MG capsule Take 2,250 mg by mouth 3 (three) times daily.    [provider]  calcium-vitamin D (OSCAL WITH D) 500-200 MG-UNIT tablet Take 1 tablet by mouth 3 (three) times daily. 11/10/18   Leandrew Koyanagi, MD  cholecalciferol (VITAMIN D3) 25 MCG (1000 UT) tablet Take 1,000 Units by mouth daily.    [provider]  diazepam (VALIUM) 5 MG tablet Take 0.5 tablets (2.5 mg total) by mouth every 12 (twelve) hours as needed for anxiety or muscle spasms. Patient taking differently: Take 1.25 mg by mouth 2 (two) times a day.  12/31/18   Manuella Ghazi, Pratik D, DO  ELIQUIS 2.5 MG TABS tablet Take 2.5 mg by mouth 2 (two) times daily.  04/28/18   [provider]  fluticasone (FLONASE) 50 MCG/ACT nasal spray Place 1 spray into  both nostrils 2 (two) times daily.     [provider]  furosemide (LASIX) 20 MG tablet Take 1 tablet (20 mg total) by mouth daily. 01/30/19   Orson Eva, MD  gabapentin (NEURONTIN) 100 MG capsule Take 100-300 mg by mouth 3 (three) times daily as needed (for nerve pain).     [provider]  HYDROCORTISONE ACE, RECTAL, 30 MG SUPP Place 1 suppository rectally at bedtime.  06/08/15   [provider]  hyoscyamine (LEVSIN, ANASPAZ) 0.125 MG tablet Take 0.125 mg by mouth every 6 (six) hours as needed for bladder spasms (and/or colon spasms).  08/18/18   [provider]  loratadine (CLARITIN) 10 MG tablet Take 10 mg by mouth daily.    [provider]  nystatin cream (MYCOSTATIN) Apply 1 application topically daily as needed for dry skin (and/or skin rash).  05/08/18   [provider]  oxyCODONE-acetaminophen (PERCOCET/ROXICET) 5-325 MG tablet Take 1-2 tablets by mouth every 6 (six) hours as needed for moderate pain or severe pain. 12/31/18   Manuella Ghazi, Pratik D, DO  pantoprazole (PROTONIX) 20 MG tablet Take 20 mg by mouth daily.    [provider]     Allergies:     Allergies  Allergen Reactions  . Penicillins Anaphylaxis    Has patient had a PCN reaction causing immediate rash, facial/tongue/throat swelling, SOB or lightheadedness with hypotension: Yes Has patient had a PCN reaction causing severe rash involving mucus membranes or skin necrosis: Yes Has patient had a PCN reaction that required hospitalization: No Has patient had a PCN reaction occurring within the last 10 years: No If all of the above answers are "NO", then may proceed with Cephalosporin use.   . Sulfa Antibiotics Anaphylaxis  . Morphine And Related Nausea And Vomiting  . Voltaren [Diclofenac]     Chest pains - Voltaren Gel      Physical Exam:   Vitals  Blood pressure (!) 138/92, pulse (!) 136, temperature 98.2 F (36.8 C), temperature source Oral, resp. rate (!) 34,  height 5' 1"  (1.549 m), weight 68.1 kg, SpO2 95 %.  1.  General: aoxo3 Cushingoid features (moon face)  2. Psychiatric: euthymic  3. Neurologic: cn2- 12intact, reflexes 2+ symmetric, diffuse with no clonus, motor 5/5 in all 4 ext  4. HEENMT:  Anicteric, pupils 1.32m symmetric, direct, consensual, near intact Neck: no jvd, no bruit  5. Respiratory :  Decrease bs at left lung base,  Slight crackles at right lung base, no wheezing,   6. Cardiovascular : Tachy s1, s2,  1/6 sem apex  7. Gastrointestinal:  Abd: soft, nt, nd, +bs  8. Skin:  Ext: no c/c  1+ edema,  Slight bruise, hematoma about 3x5cm oval on the lateral aspect of the distal left lower ext, midway up to knee Redness from ankle up to knee left distal lower ext, slight warmth  9.Musculoskeletal:  Good ROM,  No adenopathy    Data Review:    CBC Recent Labs  Lab 05/08/19 2115  WBC 16.9*  HGB 13.3  HCT 41.7  PLT 266  MCV 92.7  MCH 29.6  MCHC 31.9  RDW 15.7*  LYMPHSABS 0.9  MONOABS 0.5  EOSABS 0.0  BASOSABS 0.1   ------------------------------------------------------------------------------------------------------------------  Results for orders placed or performed during the hospital encounter of 05/08/19 (from the past 48 hour(s))  CBC     Status: Abnormal   Collection Time: 05/08/19  9:15 PM  Result Value Ref Range   WBC 16.9 (H) 4.0 - 10.5 K/uL   RBC 4.50 3.87 - 5.11 MIL/uL   Hemoglobin 13.3 12.0 - 15.0 g/dL   HCT 41.7 36.0 - 46.0 %   MCV 92.7 80.0 - 100.0 fL   MCH 29.6 26.0 - 34.0 pg   MCHC 31.9 30.0 - 36.0 g/dL   RDW 15.7 (H) 11.5 - 15.5 %   Platelets 266 150 - 400 K/uL   nRBC 0.1 0.0 - 0.2 %    Comment: Performed at [facility], [address], [place], [place] [address]  Comprehensive metabolic panel     Status: Abnormal   Collection Time: 05/08/19  9:15 PM  Result Value Ref Range   Sodium 140 135 - 145 mmol/L   Potassium 3.3 (L) 3.5 - 5.1 mmol/L   Chloride 102 98 - 111 mmol/L   CO2  25 22 - 32 mmol/L   Glucose, Bld 105 (H) 70 - 99 mg/dL   BUN 10 8 - 23 mg/dL   Creatinine, Ser 0.58 0.44 - 1.00 mg/dL   Calcium 8.5 (L) 8.9 - 10.3 mg/dL   Total Protein 6.2 (L) 6.5 - 8.1 g/dL   Albumin 3.1 (L) 3.5 - 5.0 g/dL   AST 27 15 - 41 U/L   ALT 22 0 - 44 U/L   Alkaline Phosphatase 156 (H) 38 - 126 U/L   Total Bilirubin 0.5 0.3 - 1.2 mg/dL   GFR calc non Af Amer >60 >60 mL/min   GFR calc Af Amer >60 >60 mL/min   Anion gap 13 5 - 15    Comment: Performed at [facility], [address], [place], [place] [address]  Lactic acid, plasma     Status: Abnormal   Collection Time: 05/08/19  9:15 PM  Result Value Ref Range   Lactic Acid, Venous 3.7 (HH) 0.5 - 1.9 mmol/L    Comment: CRITICAL RESULT CALLED TO, READ BACK BY AND VERIFIED WITH: TUTTLE,A @ 2156 ON 05/08/19 BY JUW Performed at [facility], [address], [place], [place] [address]   Differential     Status: Abnormal   Collection Time: 05/08/19  9:15 PM  Result Value Ref Range   Neutrophils Relative % 89 %   Neutro Abs 15.0 (H) 1.7 - 7.7 K/uL   Lymphocytes Relative 5 %   Lymphs Abs 0.9 0.7 - 4.0 K/uL   Monocytes Relative 3 %   Monocytes Absolute 0.5 0.1 - 1.0  K/uL   Eosinophils Relative 0 %   Eosinophils Absolute 0.0 0.0 - 0.5 K/uL   Basophils Relative 0 %   Basophils Absolute 0.1 0.0 - 0.1 K/uL    Comment: Performed at [facility], [address] [place], [place] [address]  APTT     Status: None   Collection Time: 05/08/19  9:16 PM  Result Value Ref Range   aPTT 25 24 - 36 seconds    Comment: Performed at [facility], [address] [place], [place] [address]  Protime-INR     Status: None   Collection Time: 05/08/19  9:16 PM  Result Value Ref Range   Prothrombin Time 13.8 11.4 - 15.2 seconds   INR 1.1 0.8 - 1.2    Comment: (NOTE) INR goal varies based on device and disease states. Performed at [facility], [address], [place], [place] [address]   Blood Culture (routine x 2)     Status: None (Preliminary  result)   Collection Time: 05/08/19  9:17 PM   Specimen: Left Antecubital; Blood  Result Value Ref Range   Specimen Description LEFT ANTECUBITAL    Special Requests      BOTTLES DRAWN AEROBIC AND ANAEROBIC Blood Culture adequate volume Performed at [facility], [address], [place], [place] [address]    Culture PENDING    Report Status PENDING   Brain natriuretic peptide     Status: Abnormal   Collection Time: 05/08/19  9:17 PM  Result Value Ref Range   B Natriuretic Peptide 112.0 (H) 0.0 - 100.0 pg/mL    Comment: Performed at [facility], [address] [place], [place] [address]    Chemistries  Recent Labs  Lab 05/08/19 2115  NA 140  K 3.3*  CL 102  CO2 25  GLUCOSE 105*  BUN 10  CREATININE 0.58  CALCIUM 8.5*  AST 27  ALT 22  ALKPHOS 156*  BILITOT 0.5   ------------------------------------------------------------------------------------------------------------------  ------------------------------------------------------------------------------------------------------------------ GFR: Estimated Creatinine Clearance: 64.3 mL/min (by C-G formula based on SCr of 0.58 mg/dL). Liver Function Tests: Recent Labs  Lab 05/08/19 2115  AST 27  ALT 22  ALKPHOS 156*  BILITOT 0.5  PROT 6.2*  ALBUMIN 3.1*   No results for input(s): LIPASE, AMYLASE in the last 168 hours. No results for input(s): AMMONIA in the last 168 hours. Coagulation Profile: Recent Labs  Lab 05/08/19 2116  INR 1.1   Cardiac Enzymes: No results for input(s): CKTOTAL, CKMB, CKMBINDEX, TROPONINI in the last 168 hours. BNP (last 3 results) No results for input(s): PROBNP in the last 8760 hours. HbA1C: No results for input(s): HGBA1C in the last 72 hours. CBG: No results for input(s): GLUCAP in the last 168 hours. Lipid Profile: No results for input(s): CHOL, HDL, LDLCALC, TRIG, CHOLHDL, LDLDIRECT in the last 72 hours. Thyroid Function Tests: No results for input(s): TSH, T4TOTAL, FREET4,  T3FREE, THYROIDAB in the last 72 hours. Anemia Panel: No results for input(s): VITAMINB12, FOLATE, FERRITIN, TIBC, IRON, RETICCTPCT in the last 72 hours.  --------------------------------------------------------------------------------------------------------------- Urine analysis:    Component Value Date/Time   COLORURINE YELLOW 01/29/2019 0927   APPEARANCEUR CLOUDY (A) 01/29/2019 0927   LABSPEC 1.012 01/29/2019 0927   PHURINE 9.0 (H) 01/29/2019 0927   GLUCOSEU NEGATIVE 01/29/2019 0927   HGBUR SMALL (A) 01/29/2019 [place] NEGATIVE 01/29/2019 [place] 01/29/2019 0927   PROTEINUR 30 (A) 01/29/2019 0927   NITRITE POSITIVE (A) 01/29/2019 0927   LEUKOCYTESUR LARGE (A) 01/29/2019 2035  Imaging Results:    Dg Chest Port 1 View  Result Date: 05/08/2019 CLINICAL DATA:  Tachycardia EXAM: PORTABLE CHEST 1 VIEW COMPARISON:  April 15, 2019 FINDINGS: The heart size is enlarged. The lung volumes are low. There are airspace opacities throughout both lung fields, greatest at the left lung base. There is no definite acute osseous abnormality. No pneumothorax. The heart size appears enlarged but is likely stable from prior study. IMPRESSION: 1. Low lung volumes 2. Scattered airspace opacities, greatest at the left lung base, concerning for multifocal pneumonia. There may be a small to moderate-sized left-sided pleural effusion. 3. Prominent interstitial lung markings suggestive volume overload with possible developing pulmonary edema. Electronically Signed   By: Constance Holster M.D.   On: 05/08/2019 21:50       Assessment & Plan:    Principal Problem:   Sepsis ([place]) Active Problems:   CAP (community acquired pneumonia)   Hypokalemia   AF (paroxysmal atrial fibrillation) (HCC)   Essential hypertension   Tachycardia   Pleural effusion, left  Sepsis (tachycardia, tachypnea, elevated lactic acid, leukocytosis) Blood culture x2 vanco iv, levaquin iv pharmacy to  dose  CAP Blood culture x2 Urine strep antigen, urine legionella antigen vanco / levaquin iv as above  L pleural effusion? CT chest ordered, pt refused, consider Lateral decubitus film  Cellulitis left distal lower ext abx as above  Pafib,  Cont Atenolol 43m po tid Cont Eliquis pharmacy to dose  Anxiety Cont Valium 2.587mpo bid prn   Gerd Cont PPI  Ulcerative collitis, Adrenal insufficiency Cont chronic prednisone, increase to 1521mo qday (stress dose) Cont Colazal Cont Hydrocortisone rectally  Chronic pain Cont Gabapentin  (note can cause lower ext edema) Cont Percocet  T6, T11 compression fracture Cont osal+ D Cont vitamin D Pt will need bone density and antiresorptive therapy , needs to discuss with PCP Pt may benefit from vertebroplasty/ kyphoplasty, PCP to consider referal to Dr. DahMelina Schools SanLuanne Bras  DVT Prophylaxis-   Eliquis SCDs   AM Labs Ordered, also please review Full Orders  Family Communication: Admission, patients condition and plan of care including tests being ordered have been discussed with the patient  who indicate understanding and agree with the plan and Code Status.  Code Status:  FULL CODE,  Pt is accompanied by her friend today  Admission status:   Inpatient: Based on patients clinical presentation and evaluation of above clinical data, I have made determination that patient meets Inpatient criteria at this time.  Pt has high risk of clinical deterioration, pt has sepsis and will require iv abx, for cellulitis as well as cap,  pt will require close monitoring due to hx of adrenal insufficiency.  Pt will require > 2nites stay.   Time spent in minutes :   70   JamJani GravelD on 05/08/2019 at 10:54 PM

## 2019-05-09 ENCOUNTER — Inpatient Hospital Stay (HOSPITAL_COMMUNITY): Payer: BC Managed Care – PPO

## 2019-05-09 DIAGNOSIS — L8990 Pressure ulcer of unspecified site, unspecified stage: Secondary | ICD-10-CM | POA: Insufficient documentation

## 2019-05-09 DIAGNOSIS — A419 Sepsis, unspecified organism: Secondary | ICD-10-CM

## 2019-05-09 DIAGNOSIS — J181 Lobar pneumonia, unspecified organism: Secondary | ICD-10-CM

## 2019-05-09 DIAGNOSIS — J9621 Acute and chronic respiratory failure with hypoxia: Secondary | ICD-10-CM

## 2019-05-09 DIAGNOSIS — Z7189 Other specified counseling: Secondary | ICD-10-CM

## 2019-05-09 LAB — URINALYSIS, ROUTINE W REFLEX MICROSCOPIC
Bilirubin Urine: NEGATIVE
Glucose, UA: 150 mg/dL — AB
Hgb urine dipstick: NEGATIVE
Ketones, ur: 5 mg/dL — AB
Leukocytes,Ua: NEGATIVE
Nitrite: NEGATIVE
Protein, ur: NEGATIVE mg/dL
Specific Gravity, Urine: 1.014 (ref 1.005–1.030)
pH: 7 (ref 5.0–8.0)

## 2019-05-09 LAB — COMPREHENSIVE METABOLIC PANEL
ALT: 20 U/L (ref 0–44)
AST: 21 U/L (ref 15–41)
Albumin: 2.7 g/dL — ABNORMAL LOW (ref 3.5–5.0)
Alkaline Phosphatase: 134 U/L — ABNORMAL HIGH (ref 38–126)
Anion gap: 11 (ref 5–15)
BUN: 10 mg/dL (ref 8–23)
CO2: 27 mmol/L (ref 22–32)
Calcium: 8 mg/dL — ABNORMAL LOW (ref 8.9–10.3)
Chloride: 98 mmol/L (ref 98–111)
Creatinine, Ser: 0.58 mg/dL (ref 0.44–1.00)
GFR calc Af Amer: >60 mL/min (ref >60)
GFR calc non Af Amer: >60 mL/min (ref >60)
Glucose, Bld: 96 mg/dL (ref 70–99)
Potassium: 3.2 mmol/L — ABNORMAL LOW (ref 3.5–5.1)
Sodium: 136 mmol/L (ref 135–145)
Total Bilirubin: 0.9 mg/dL (ref 0.3–1.2)
Total Protein: 5.5 g/dL — ABNORMAL LOW (ref 6.5–8.1)

## 2019-05-09 LAB — CBC
HCT: 38.1 % (ref 36.0–46.0)
Hemoglobin: 11.9 g/dL — ABNORMAL LOW (ref 12.0–15.0)
MCH: 29.4 pg (ref 26.0–34.0)
MCHC: 31.2 g/dL (ref 30.0–36.0)
MCV: 94.1 fL (ref 80.0–100.0)
Platelets: 224 10*3/uL (ref 150–400)
RBC: 4.05 MIL/uL (ref 3.87–5.11)
RDW: 15.8 % — ABNORMAL HIGH (ref 11.5–15.5)
WBC: 17.6 10*3/uL — ABNORMAL HIGH (ref 4.0–10.5)
nRBC: 0.1 % (ref 0.0–0.2)

## 2019-05-09 LAB — SARS CORONAVIRUS 2 BY RT PCR (HOSPITAL ORDER, PERFORMED IN ~~LOC~~ HOSPITAL LAB): SARS Coronavirus 2: NEGATIVE

## 2019-05-09 LAB — TROPONIN I (HIGH SENSITIVITY): Troponin I (High Sensitivity): 20 ng/L — ABNORMAL HIGH (ref <18)

## 2019-05-09 LAB — STREP PNEUMONIAE URINARY ANTIGEN: Strep Pneumo Urinary Antigen: NEGATIVE

## 2019-05-09 LAB — LACTIC ACID, PLASMA: Lactic Acid, Venous: 1.8 mmol/L (ref 0.5–1.9)

## 2019-05-09 LAB — PROCALCITONIN: Procalcitonin: 1.5 ng/mL

## 2019-05-09 LAB — MRSA PCR SCREENING: MRSA by PCR: NEGATIVE

## 2019-05-09 MED ORDER — PREDNISONE 10 MG PO TABS
15.0000 mg | ORAL_TABLET | Freq: Every day | ORAL | Status: DC
  Administered 2019-05-09 – 2019-05-14 (×6): 15 mg via ORAL
  Filled 2019-05-09 (×6): qty 2

## 2019-05-09 MED ORDER — OXYCODONE-ACETAMINOPHEN 5-325 MG PO TABS
1.0000 | ORAL_TABLET | Freq: Four times a day (QID) | ORAL | Status: DC | PRN
  Administered 2019-05-09: 1 via ORAL
  Administered 2019-05-09: 2 via ORAL
  Administered 2019-05-10: 21:00:00 1 via ORAL
  Administered 2019-05-10 – 2019-05-13 (×6): 2 via ORAL
  Administered 2019-05-13: 09:00:00 1 via ORAL
  Administered 2019-05-14 (×2): 2 via ORAL
  Filled 2019-05-09: qty 1
  Filled 2019-05-09 (×2): qty 2
  Filled 2019-05-09: qty 1
  Filled 2019-05-09 (×5): qty 2
  Filled 2019-05-09: qty 1
  Filled 2019-05-09 (×2): qty 2

## 2019-05-09 MED ORDER — POTASSIUM CHLORIDE CRYS ER 20 MEQ PO TBCR
40.0000 meq | EXTENDED_RELEASE_TABLET | Freq: Once | ORAL | Status: AC
  Administered 2019-05-09: 40 meq via ORAL
  Filled 2019-05-09: qty 2

## 2019-05-09 MED ORDER — PANTOPRAZOLE SODIUM 40 MG PO TBEC
40.0000 mg | DELAYED_RELEASE_TABLET | Freq: Every day | ORAL | Status: DC
  Administered 2019-05-09 – 2019-05-14 (×6): 40 mg via ORAL
  Filled 2019-05-09 (×6): qty 1

## 2019-05-09 MED ORDER — CHLORHEXIDINE GLUCONATE CLOTH 2 % EX PADS
6.0000 | MEDICATED_PAD | Freq: Every day | CUTANEOUS | Status: DC
  Administered 2019-05-09 – 2019-05-12 (×4): 6 via TOPICAL

## 2019-05-09 MED ORDER — ATENOLOL 25 MG PO TABS
25.0000 mg | ORAL_TABLET | Freq: Two times a day (BID) | ORAL | Status: DC
  Administered 2019-05-09: 10:00:00 25 mg via ORAL
  Filled 2019-05-09: qty 1

## 2019-05-09 MED ORDER — APIXABAN 2.5 MG PO TABS: 2.5000 mg | ORAL_TABLET | Freq: Two times a day (BID) | ORAL | Status: DC

## 2019-05-09 MED ORDER — FUROSEMIDE 20 MG PO TABS
20.0000 mg | ORAL_TABLET | Freq: Two times a day (BID) | ORAL | Status: DC
  Administered 2019-05-09 – 2019-05-14 (×10): 20 mg via ORAL
  Filled 2019-05-09 (×10): qty 1

## 2019-05-09 MED ORDER — DIAZEPAM 5 MG PO TABS
1.2500 mg | ORAL_TABLET | Freq: Two times a day (BID) | ORAL | Status: DC | PRN
  Administered 2019-05-09 – 2019-05-12 (×5): 2.5 mg via ORAL
  Filled 2019-05-09 (×5): qty 1

## 2019-05-09 MED ORDER — VANCOMYCIN HCL IN DEXTROSE 1-5 GM/200ML-% IV SOLN
1000.0000 mg | INTRAVENOUS | Status: DC
  Administered 2019-05-09 – 2019-05-11 (×3): 1000 mg via INTRAVENOUS
  Filled 2019-05-09 (×3): qty 200

## 2019-05-09 MED ORDER — METOPROLOL TARTRATE 5 MG/5ML IV SOLN
5.0000 mg | Freq: Once | INTRAVENOUS | Status: AC
  Administered 2019-05-09: 5 mg via INTRAVENOUS
  Filled 2019-05-09: qty 5

## 2019-05-09 MED ORDER — PANTOPRAZOLE SODIUM 20 MG PO TBEC
20.0000 mg | DELAYED_RELEASE_TABLET | Freq: Every day | ORAL | Status: DC
  Filled 2019-05-09 (×2): qty 1

## 2019-05-09 MED ORDER — ONDANSETRON HCL 4 MG/2ML IJ SOLN
4.0000 mg | Freq: Four times a day (QID) | INTRAMUSCULAR | Status: DC | PRN
  Administered 2019-05-09 – 2019-05-10 (×2): 4 mg via INTRAVENOUS
  Filled 2019-05-09 (×2): qty 2

## 2019-05-09 MED ORDER — HYDROCORTISONE ACETATE 25 MG RE SUPP
25.0000 mg | Freq: Every day | RECTAL | Status: DC
  Administered 2019-05-09 – 2019-05-13 (×5): 25 mg via RECTAL
  Filled 2019-05-09 (×5): qty 1

## 2019-05-09 MED ORDER — VANCOMYCIN HCL 500 MG IV SOLR
INTRAVENOUS | Status: AC
  Filled 2019-05-09: qty 500

## 2019-05-09 MED ORDER — ATENOLOL 25 MG PO TABS: 25.0000 mg | ORAL_TABLET | Freq: Three times a day (TID) | ORAL | Status: DC

## 2019-05-09 MED ORDER — LEVOFLOXACIN IN D5W 750 MG/150ML IV SOLN: 750.0000 mg | INTRAVENOUS | Status: DC

## 2019-05-09 MED ORDER — BALSALAZIDE DISODIUM 750 MG PO CAPS
2250.0000 mg | ORAL_CAPSULE | Freq: Three times a day (TID) | ORAL | Status: DC
  Administered 2019-05-10 – 2019-05-14 (×13): 2250 mg via ORAL
  Filled 2019-05-09 (×22): qty 3

## 2019-05-09 MED ORDER — FLUTICASONE PROPIONATE 50 MCG/ACT NA SUSP
1.0000 | Freq: Two times a day (BID) | NASAL | Status: DC
  Administered 2019-05-09 – 2019-05-14 (×9): 1 via NASAL
  Filled 2019-05-09: qty 16

## 2019-05-09 MED ORDER — LORATADINE 10 MG PO TABS
10.0000 mg | ORAL_TABLET | Freq: Every day | ORAL | Status: DC
  Administered 2019-05-09 – 2019-05-14 (×6): 10 mg via ORAL
  Filled 2019-05-09 (×6): qty 1

## 2019-05-09 MED ORDER — ATENOLOL 25 MG PO TABS
25.0000 mg | ORAL_TABLET | Freq: Three times a day (TID) | ORAL | Status: DC
  Administered 2019-05-09 – 2019-05-14 (×13): 25 mg via ORAL
  Filled 2019-05-09 (×14): qty 1

## 2019-05-09 MED ORDER — GABAPENTIN 100 MG PO CAPS
100.0000 mg | ORAL_CAPSULE | Freq: Three times a day (TID) | ORAL | Status: DC | PRN
  Administered 2019-05-09: 200 mg via ORAL
  Administered 2019-05-10 – 2019-05-11 (×2): 100 mg via ORAL
  Filled 2019-05-09 (×2): qty 1
  Filled 2019-05-09: qty 2

## 2019-05-09 MED ORDER — VITAMIN D 25 MCG (1000 UNIT) PO TABS
500.0000 [IU] | ORAL_TABLET | Freq: Every day | ORAL | Status: DC
  Administered 2019-05-09 – 2019-05-14 (×6): 500 [IU] via ORAL
  Filled 2019-05-09 (×6): qty 1

## 2019-05-09 MED ORDER — ORAL CARE MOUTH RINSE
15.0000 mL | Freq: Two times a day (BID) | OROMUCOSAL | Status: DC
  Administered 2019-05-10 – 2019-05-14 (×7): 15 mL via OROMUCOSAL

## 2019-05-09 MED ORDER — APIXABAN 5 MG PO TABS: 5.0000 mg | ORAL_TABLET | Freq: Two times a day (BID) | ORAL | Status: DC

## 2019-05-09 MED ORDER — SODIUM CHLORIDE 0.9 % IV SOLN
2.0000 g | Freq: Three times a day (TID) | INTRAVENOUS | Status: DC
  Administered 2019-05-09 – 2019-05-14 (×15): 2 g via INTRAVENOUS
  Filled 2019-05-09 (×25): qty 2

## 2019-05-09 MED ORDER — VANCOMYCIN HCL 10 G IV SOLR
250.0000 mg | Freq: Once | INTRAVENOUS | Status: AC
  Administered 2019-05-09: 250 mg via INTRAVENOUS
  Filled 2019-05-09: qty 250

## 2019-05-09 MED ORDER — APIXABAN 2.5 MG PO TABS
2.5000 mg | ORAL_TABLET | Freq: Two times a day (BID) | ORAL | Status: DC
  Administered 2019-05-09 – 2019-05-14 (×11): 2.5 mg via ORAL
  Filled 2019-05-09 (×14): qty 1

## 2019-05-09 MED ORDER — CALCIUM CARBONATE-VITAMIN D 500-200 MG-UNIT PO TABS
1.0000 | ORAL_TABLET | Freq: Every day | ORAL | Status: DC
  Administered 2019-05-09 – 2019-05-14 (×7): 1 via ORAL
  Filled 2019-05-09 (×6): qty 1

## 2019-05-09 NOTE — Progress Notes (Signed)
Midlevel contacted in regards to administering home med, Colazal 2250mg  PO, provided by husband since medication is not stocked. Awaiting orders.

## 2019-05-09 NOTE — Progress Notes (Signed)
Spoke with RN re IV Team consult.  Aware to contact CVW for needs.

## 2019-05-09 NOTE — Progress Notes (Signed)
Pharmacy Antibiotic Note  Betty Bryan is a 63 y.o. female admitted on 05/08/2019 with sepsis and cellulitis.  Pharmacy has been consulted for vancomycin and levofloxacin dosing. This patient was also started on aztreonam this morning, as patient appears to have a PCN allergy and hasn't received cephalosporins according to her medical record.  Plan: Start Levaquin 750mg  IV q24h Loading dose:  Vancomycin 1.25g IV x1 dose Maintenance dose:  Vancomycin 1g IV q24h Goal vancomycin trough range: 15-20   mcg/mL Pharmacy will continue to monitor renal function, vancomycin troughs as clinically appropriate, cultures and patient progress.    Height: 5\' 1"  (154.9 cm) Weight: 150 lb 3.2 oz (68.1 kg) IBW/kg (Calculated) : 47.8  Temp (24hrs), Avg:98.2 F (36.8 C), Min:98.2 F (36.8 C), Max:98.2 F (36.8 C)  Recent Labs  Lab 05/08/19 2115 05/09/19 0033 05/09/19 0615  WBC 16.9*  --  17.6*  CREATININE 0.58  --  0.58  LATICACIDVEN 3.7* 1.8  --     Estimated Creatinine Clearance: 64.3 mL/min (by C-G formula based on SCr of 0.58 mg/dL).    Allergies  Allergen Reactions  . Penicillins Anaphylaxis    Has patient had a PCN reaction causing immediate rash, facial/tongue/throat swelling, SOB or lightheadedness with hypotension: Yes Has patient had a PCN reaction causing severe rash involving mucus membranes or skin necrosis: Yes Has patient had a PCN reaction that required hospitalization: No Has patient had a PCN reaction occurring within the last 10 years: No If all of the above answers are "NO", then may proceed with Cephalosporin use.   . Sulfa Antibiotics Anaphylaxis  . Morphine And Related Nausea And Vomiting  . Voltaren [Diclofenac]     Chest pains - Voltaren Gel     Antimicrobials this admission: aztreonam 8/16 >>  levofloxacin 8/16>>   vancomycin 8/16>>    Microbiology results: 8/16 BC x2:  pend 8/16 PJK:DTOI   8/16 SARS-CoV-2: neg 8/16 MRSA PCR: neg  Thank you for  allowing pharmacy to be a part of this patient's care.  Despina Pole 05/09/2019 9:23 AM
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
## 2019-05-09 NOTE — Progress Notes (Signed)
Xcover Iv access limited ,  Her iv infiltrated,  Iv team consulted for PICC Had to DC IVF, consider resuming once have better IV access.

## 2019-05-09 NOTE — Progress Notes (Signed)
ANTICOAGULATION CONSULT NOTE - Initial Consult  Pharmacy Consult for apixaban dosing Indication: atrial fibrillation  Allergies  Allergen Reactions  . Penicillins Anaphylaxis    Has patient had a PCN reaction causing immediate rash, facial/tongue/throat swelling, SOB or lightheadedness with hypotension: Yes Has patient had a PCN reaction causing severe rash involving mucus membranes or skin necrosis: Yes Has patient had a PCN reaction that required hospitalization: No Has patient had a PCN reaction occurring within the last 10 years: No If all of the above answers are "NO", then may proceed with Cephalosporin use.   . Sulfa Antibiotics Anaphylaxis  . Morphine And Related Nausea And Vomiting  . Voltaren [Diclofenac]     Chest pains - Voltaren Gel     Patient Measurements: Height: 5\' 1"  (154.9 cm) Weight: 150 lb 3.2 oz (68.1 kg) IBW/kg (Calculated) : 47.8 Heparin Dosing Weight: HEPARIN DW (KG): 62.3  Vital Signs: BP: 113/68 (08/16 0800) Pulse Rate: 124 (08/16 0600)  Labs: Recent Labs    05/08/19 2115 05/08/19 2116 05/09/19 0615  HGB 13.3  --  11.9*  HCT 41.7  --  38.1  PLT 266  --  224  APTT  --  25  --   LABPROT  --  13.8  --   INR  --  1.1  --   CREATININE 0.58  --  0.58  TROPONINIHS  --   --  20*    Estimated Creatinine Clearance: 64.3 mL/min (by C-G formula based on SCr of 0.58 mg/dL).   Medical History: Past Medical History:  Diagnosis Date  . Adrenal insufficiency ([place])   . HTN (hypertension)   . L4 vertebral fracture ([facility])    L4-L5 fracture  . Palpitations   . Paroxysmal A-fib ([place])   . PVC's (premature ventricular contractions)   . Ulcerative colitis      Assessment: Pharmacy consulted to dose apixaban for this    63 yo female  with  paroxysmal atrial fibrillation and history of DVT.  She was taking apixaban 2.5mg  bid.  Her baseline CBC and coags are WNL.    Plan:  Re-start home dose of apixaban 2.5mg  daily Pharmacy will continue to monitor  pertinent labs and s/s of bleeding.  Despina Pole 05/09/2019,8:46 AM

## 2019-05-09 NOTE — Progress Notes (Signed)
ANTIBIOTIC CONSULT NOTE-Preliminary  Pharmacy Consult for levofloxacin and vancomcyin  Indication: sepsis  Allergies  Allergen Reactions  . Penicillins Anaphylaxis    Has patient had a PCN reaction causing immediate rash, facial/tongue/throat swelling, SOB or lightheadedness with hypotension: Yes Has patient had a PCN reaction causing severe rash involving mucus membranes or skin necrosis: Yes Has patient had a PCN reaction that required hospitalization: No Has patient had a PCN reaction occurring within the last 10 years: No If all of the above answers are "NO", then may proceed with Cephalosporin use.   . Sulfa Antibiotics Anaphylaxis  . Morphine And Related Nausea And Vomiting  . Voltaren [Diclofenac]     Chest pains - Voltaren Gel     Patient Measurements: Height: 5\' 1"  (154.9 cm) Weight: 150 lb 3.2 oz (68.1 kg) IBW/kg (Calculated) : 47.8 Adjusted Body Weight:   Vital Signs: Temp: 98.2 F (36.8 C) (08/15 2011) Temp Source: Oral (08/15 2011) BP: 138/92 (08/15 2200) Pulse Rate: 136 (08/15 2200)  Labs: Recent Labs    05/08/19 2115  WBC 16.9*  HGB 13.3  PLT 266  CREATININE 0.58    Estimated Creatinine Clearance: 64.3 mL/min (by C-G formula based on SCr of 0.58 mg/dL).  No results for input(s): VANCOTROUGH, VANCOPEAK, VANCORANDOM, GENTTROUGH, GENTPEAK, GENTRANDOM, TOBRATROUGH, TOBRAPEAK, TOBRARND, AMIKACINPEAK, AMIKACINTROU, AMIKACIN in the last 72 hours.   Microbiology: Recent Results (from the past 720 hour(s))  Blood Culture (routine x 2)     Status: None (Preliminary result)   Collection Time: 05/08/19  9:17 PM   Specimen: Left Antecubital; Blood  Result Value Ref Range Status   Specimen Description LEFT ANTECUBITAL  Final   Special Requests   Final    BOTTLES DRAWN AEROBIC AND ANAEROBIC Blood Culture adequate volume Performed at [facility], [address], [place], [place] [address]    Culture PENDING  Incomplete   Report Status PENDING  Incomplete   SARS Coronavirus 2 [facility] order, Performed in [facility] hospital lab) Nasopharyngeal Nasopharyngeal Swab     Status: None   Collection Time: 05/09/19 12:52 AM   Specimen: Nasopharyngeal Swab  Result Value Ref Range Status   SARS Coronavirus 2 NEGATIVE NEGATIVE Final    Comment: (NOTE) If result is NEGATIVE SARS-CoV-2 target nucleic acids are NOT DETECTED. The SARS-CoV-2 RNA is generally detectable in upper and lower  respiratory specimens during the acute phase of infection. The lowest  concentration of SARS-CoV-2 viral copies this assay can detect is 250  copies / mL. A negative result does not preclude SARS-CoV-2 infection  and should not be used as the sole basis for treatment or other  patient management decisions.  A negative result may occur with  improper specimen collection / handling, submission of specimen other  than nasopharyngeal swab, presence of viral mutation(s) within the  areas targeted by this assay, and inadequate number of viral copies  (<250 copies / mL). A negative result must be combined with clinical  observations, patient history, and epidemiological information. If result is POSITIVE SARS-CoV-2 target nucleic acids are DETECTED. The SARS-CoV-2 RNA is generally detectable in upper and lower  respiratory specimens dur ing the acute phase of infection.  Positive  results are indicative of active infection with SARS-CoV-2.  Clinical  correlation with patient history and other diagnostic information is  necessary to determine patient infection status.  Positive results do  not rule out bacterial infection or co-infection with other viruses. If result is PRESUMPTIVE POSTIVE SARS-CoV-2 nucleic acids MAY BE PRESENT.  A presumptive positive result was obtained on the submitted specimen  and confirmed on repeat testing.  While 2019 novel coronavirus  (SARS-CoV-2) nucleic acids may be present in the submitted sample  additional confirmatory testing may be  necessary for epidemiological  and / or clinical management purposes  to differentiate between  SARS-CoV-2 and other Sarbecovirus currently known to infect humans.  If clinically indicated additional testing with an alternate test  methodology [phone]) is advised. The SARS-CoV-2 RNA is generally  detectable in upper and lower respiratory sp ecimens during the acute  phase of infection. The expected result is Negative. Fact Sheet for Patients:  StrictlyIdeas.no Fact Sheet for Healthcare Providers: BankingDealers.co.za This test is not yet approved or cleared by the [place] FDA and has been authorized for detection and/or diagnosis of SARS-CoV-2 by FDA under an Emergency Use Authorization (EUA).  This EUA will remain in effect (meaning this test can be used) for the duration of the COVID-19 declaration under Section 564(b)(1) of the Act, 21 U.S.C. section 360bbb-3(b)(1), unless the authorization is terminated or revoked sooner. Performed at [facility], [address], [place], [place] [address]   MRSA PCR Screening     Status: None   Collection Time: 05/09/19  1:00 AM   Specimen: Nasal Mucosa; Nasopharyngeal  Result Value Ref Range Status   MRSA by PCR NEGATIVE NEGATIVE Final    Comment:        The GeneXpert MRSA Assay (FDA approved for NASAL specimens only), is one component of a comprehensive MRSA colonization surveillance program. It is not intended to diagnose MRSA infection nor to guide or monitor treatment for MRSA infections. Performed at [facility], [address] [place], [place] [address]     Medical History: Past Medical History:  Diagnosis Date  . Adrenal insufficiency ([place])   . HTN (hypertension)   . L4 vertebral fracture (H[facility])    L4-L5 fracture  . Palpitations   . Paroxysmal A-fib ([place])   . PVC's (premature ventricular contractions)   . Ulcerative colitis     Medications:  Anti-infectives (From  admission, onward)   Start     Dose/Rate Route Frequency Ordered Stop   05/09/19 0100  vancomycin (VANCOCIN) 250 mg in sodium chloride 0.9 % 500 mL IVPB     250 mg 250 mL/hr over 120 Minutes Intravenous  Once 05/09/19 0057     05/08/19 2330  levofloxacin (LEVAQUIN) IVPB 750 mg     750 mg 100 mL/hr over 90 Minutes Intravenous  Once 05/08/19 2318 05/09/19 0209   05/08/19 2100  vancomycin (VANCOCIN) IVPB 1000 mg/200 mL premix     1,000 mg 200 mL/hr over 60 Minutes Intravenous  Once 05/08/19 2059 05/08/19 2328      Assessment: 63 yo female with sepsis. Pharmacy asked to dose levofloxacin and vancomycin  Goal of Therapy:  Vancomycin trough level 15-20 mcg/ml  Plan:  Preliminary review of pertinent patient information completed.  Protocol will be initiated with dose(s) of vancomycin 250mg  in addition to the 1 gram already given for total loading dose of 1250mg  and levofloxacin 750mg  IV x 1.  [facility]stine Na clinical pharmacist will complete review during morning rounds to assess patient and finalize treatment regimen if needed.  Nyra Capes, RPH 05/09/2019,2:24 AM

## 2019-05-10 ENCOUNTER — Inpatient Hospital Stay (HOSPITAL_COMMUNITY): Payer: BC Managed Care – PPO

## 2019-05-10 LAB — BASIC METABOLIC PANEL
Anion gap: 10 (ref 5–15)
BUN: 11 mg/dL (ref 8–23)
CO2: 27 mmol/L (ref 22–32)
Calcium: 8.2 mg/dL — ABNORMAL LOW (ref 8.9–10.3)
Chloride: 97 mmol/L — ABNORMAL LOW (ref 98–111)
Creatinine, Ser: 0.62 mg/dL (ref 0.44–1.00)
GFR calc Af Amer: >60 mL/min (ref >60)
GFR calc non Af Amer: >60 mL/min (ref >60)
Glucose, Bld: 91 mg/dL (ref 70–99)
Potassium: 3.1 mmol/L — ABNORMAL LOW (ref 3.5–5.1)
Sodium: 134 mmol/L — ABNORMAL LOW (ref 135–145)

## 2019-05-10 LAB — CBC
HCT: 36.2 % (ref 36.0–46.0)
Hemoglobin: 11.2 g/dL — ABNORMAL LOW (ref 12.0–15.0)
MCH: 29.1 pg (ref 26.0–34.0)
MCHC: 30.9 g/dL (ref 30.0–36.0)
MCV: 94 fL (ref 80.0–100.0)
Platelets: 198 10*3/uL (ref 150–400)
RBC: 3.85 MIL/uL — ABNORMAL LOW (ref 3.87–5.11)
RDW: 16 % — ABNORMAL HIGH (ref 11.5–15.5)
WBC: 16.1 10*3/uL — ABNORMAL HIGH (ref 4.0–10.5)
nRBC: 0 % (ref 0.0–0.2)

## 2019-05-10 LAB — URINE CULTURE: Culture: NO GROWTH

## 2019-05-10 LAB — MAGNESIUM: Magnesium: 1.9 mg/dL (ref 1.7–2.4)

## 2019-05-10 LAB — LEGIONELLA PNEUMOPHILA SEROGP 1 UR AG: L. pneumophila Serogp 1 Ur Ag: NEGATIVE

## 2019-05-10 LAB — PROCALCITONIN: Procalcitonin: 1.33 ng/mL

## 2019-05-10 MED ORDER — POTASSIUM CHLORIDE CRYS ER 20 MEQ PO TBCR
40.0000 meq | EXTENDED_RELEASE_TABLET | Freq: Once | ORAL | Status: AC
  Administered 2019-05-10: 13:00:00 40 meq via ORAL
  Filled 2019-05-10: qty 2

## 2019-05-10 NOTE — Progress Notes (Signed)
Patient upset about having to go to CT. She stated that she had just had one last week and asked why she needed another one. This nurse explained the reasons why this CT chest was needed, pt verbalized understanding. Patient stated that she had a lot of pain with these and requested pain medication. This nurse provided medication for pain and patient was taken to CT.

## 2019-05-10 NOTE — Progress Notes (Signed)
Outpatient heart monitor present on left upper chest. Placed by PT's cardiologist.

## 2019-05-10 NOTE — Progress Notes (Signed)
PT transported via bed to Room 302. PT O2 sat WDL on 3L oxygen. Belongings, medications from ICU, and paperwork bagged and transported with PT. PT oriented and alert. PT shows no S/S of distress. Report given to [facility].

## 2019-05-10 NOTE — Plan of Care (Signed)
  Problem: Acute Rehab PT Goals(only PT should resolve) Goal: Pt Will Go Supine/Side To Sit Outcome: Progressing Flowsheets (Taken 05/10/2019 1529) Pt will go Supine/Side to Sit:  with minimal assist  with moderate assist Goal: Patient Will Transfer Sit To/From Stand Outcome: Progressing Flowsheets (Taken 05/10/2019 1529) Patient will transfer sit to/from stand: with minimal assist Goal: Pt Will Transfer Bed To Chair/Chair To Bed Outcome: Progressing Flowsheets (Taken 05/10/2019 1529) Pt will Transfer Bed to Chair/Chair to Bed:  with min assist  with mod assist Goal: Pt Will Ambulate Outcome: Progressing Flowsheets (Taken 05/10/2019 1529) Pt will Ambulate:  15 feet  with moderate assist  with rolling walker   3:30 PM, 05/10/19 Lonell Grandchild, MPT Physical Therapist with [facility] [phone] office [phone] mobile phone
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
## 2019-05-10 NOTE — Progress Notes (Signed)
Heart monitor on Left upper chest.

## 2019-05-10 NOTE — Evaluation (Signed)
Physical Therapy Evaluation Patient Details Name: Betty Bryan MRN: [id number] DOB: August 10, 1956 Today's Date: 05/10/2019   History of Present Illness  Betty Bryan  is a 63 y.o. female,w history of adrenal insufficiency, T6, T9 compression fracture, paroxysmal atrial fibrillation, DVT, hypertension, ulcerative colitis on chronic prednisone, and  presenting with left leg pain, redness for a few days.   Pt denies fever, chills, cough, cp, palp, sob, n/v, abd pain, diarrhea, brbpr, black stool, dysuria, hematuria. Pt denies covid exposure.    Clinical Impression  Patient requires much assistance and time to sit up at bedside with difficulty scooting self forward due to generalized weakness.  Patient very unsteady on feet, at high risk for falls and limited to a few steps at bedside to transfer to chair.  Patient tolerated sitting up in chair after therapy with mechanical lift harness in place if needed when nursing staff put back bed - RN notified.  Patient will benefit from continued physical therapy in hospital and recommended venue below to increase strength, balance, endurance for safe ADLs and gait.    Follow Up Recommendations SNF;Supervision for mobility/OOB;Supervision/Assistance - 24 hour    Equipment Recommendations  None recommended by PT    Recommendations for Other Services       Precautions / Restrictions Precautions Precautions: Fall Restrictions Weight Bearing Restrictions: No      Mobility  Bed Mobility Overal bed mobility: Needs Assistance Bed Mobility: Supine to Sit     Supine to sit: Max assist     General bed mobility comments: slow labored movement with difficulty scooting to bedside  Transfers Overall transfer level: Needs assistance Equipment used: Rolling walker (2 wheeled) Transfers: Sit to/from [facility] Sit to Stand: Mod assist Stand pivot transfers: Mod assist       General transfer comment: increased time, labored  movement  Ambulation/Gait Ambulation/Gait assistance: Mod assist;Max assist Gait Distance (Feet): 3 Feet Assistive device: Rolling walker (2 wheeled) Gait Pattern/deviations: Decreased step length - right;Decreased step length - left;Decreased stride length Gait velocity: slow   General Gait Details: limited to 3-4 slow unsteady labored steps at bedside due to weakness, fatigue  Stairs            Wheelchair Mobility    Modified Rankin (Stroke Patients Only)       Balance Overall balance assessment: Needs assistance Sitting-balance support: Feet supported;No upper extremity supported Sitting balance-Leahy Scale: Fair Sitting balance - Comments: seated at bedside   Standing balance support: During functional activity;Bilateral upper extremity supported Standing balance-Leahy Scale: Poor Standing balance comment: fair/poor using RW                             Pertinent Vitals/Pain Pain Assessment: Faces Faces Pain Scale: Hurts little more Pain Location: LLE Pain Descriptors / Indicators: Sore;Grimacing;Guarding Pain Intervention(s): Limited activity within patient's tolerance;Monitored during session    Home Living Family/patient expects to be discharged to:: Private residence Living Arrangements: Non-relatives/Friends Available Help at Discharge: Available 24 hours/day;Personal care attendant Type of Home: House Home Access: Level entry     Home Layout: One level Home Equipment: Transport planner;Wheelchair - manual;Cane - single point;Shower seat;Hand held Tourist information centre manager - 2 wheels      Prior Function Level of Independence: Needs assistance   Gait / Transfers Assistance Needed: short distanced household gait using RW, uses electric stooter and/or manual w/c most of time  ADL's / Homemaking Assistance Needed: friend that stays at night  x 7 days/week, home aides during the day x 7 days/week        Hand Dominance        Extremity/Trunk  Assessment   Upper Extremity Assessment Upper Extremity Assessment: Generalized weakness    Lower Extremity Assessment Lower Extremity Assessment: Generalized weakness    Cervical / Trunk Assessment Cervical / Trunk Assessment: Kyphotic  Communication   Communication: No difficulties  Cognition Arousal/Alertness: Awake/alert Behavior During Therapy: WFL for tasks assessed/performed Overall Cognitive Status: Within Functional Limits for tasks assessed                                 General Comments: occasionally appears slightly confused      General Comments      Exercises     Assessment/Plan    PT Assessment Patient needs continued PT services  PT Problem List Decreased strength;Decreased activity tolerance;Decreased balance;Decreased mobility       PT Treatment Interventions Gait training;Functional mobility training;Therapeutic activities;Therapeutic exercise;Wheelchair mobility training;Patient/family education    PT Goals (Current goals can be found in the Care Plan section)  Acute Rehab PT Goals Patient Stated Goal: return home after rehab PT Goal Formulation: With patient Time For Goal Achievement: 05/24/19 Potential to Achieve Goals: Good    Frequency Min 3X/week   Barriers to discharge        Co-evaluation               AM-PAC PT "6 Clicks" Mobility  Outcome Measure Help needed turning from your back to your side while in a flat bed without using bedrails?: A Lot Help needed moving from lying on your back to sitting on the side of a flat bed without using bedrails?: A Lot Help needed moving to and from a bed to a chair (including a wheelchair)?: A Lot Help needed standing up from a chair using your arms (e.g., wheelchair or bedside chair)?: A Lot Help needed to walk in hospital room?: A Lot Help needed climbing 3-5 steps with a railing? : Total 6 Click Score: 11    End of Session Equipment Utilized During Treatment:  Oxygen Activity Tolerance: Patient tolerated treatment well;Patient limited by fatigue Patient left: in chair;with call bell/phone within reach;with chair alarm set Nurse Communication: Mobility status PT Visit Diagnosis: Unsteadiness on feet (R26.81);Other abnormalities of gait and mobility (R26.89);Muscle weakness (generalized) (M62.81)    Time: 8003-4917 PT Time Calculation (min) (ACUTE ONLY): 28 min   Charges:   PT Evaluation $PT Eval Moderate Complexity: 1 Mod PT Treatments $Therapeutic Activity: 23-37 mins        3:26 PM, 05/10/19 Lonell Grandchild, MPT Physical Therapist with [facility] [id number] [phone] office [phone] mobile phone

## 2019-05-11 LAB — CBC
HCT: 34.4 % — ABNORMAL LOW (ref 36.0–46.0)
Hemoglobin: 10.6 g/dL — ABNORMAL LOW (ref 12.0–15.0)
MCH: 29.2 pg (ref 26.0–34.0)
MCHC: 30.8 g/dL (ref 30.0–36.0)
MCV: 94.8 fL (ref 80.0–100.0)
Platelets: 202 10*3/uL (ref 150–400)
RBC: 3.63 MIL/uL — ABNORMAL LOW (ref 3.87–5.11)
RDW: 15.7 % — ABNORMAL HIGH (ref 11.5–15.5)
WBC: 14.7 10*3/uL — ABNORMAL HIGH (ref 4.0–10.5)
nRBC: 0 % (ref 0.0–0.2)

## 2019-05-11 LAB — BASIC METABOLIC PANEL
Anion gap: 11 (ref 5–15)
BUN: 15 mg/dL (ref 8–23)
CO2: 29 mmol/L (ref 22–32)
Calcium: 7.9 mg/dL — ABNORMAL LOW (ref 8.9–10.3)
Chloride: 95 mmol/L — ABNORMAL LOW (ref 98–111)
Creatinine, Ser: 0.63 mg/dL (ref 0.44–1.00)
GFR calc Af Amer: >60 mL/min (ref >60)
GFR calc non Af Amer: >60 mL/min (ref >60)
Glucose, Bld: 110 mg/dL — ABNORMAL HIGH (ref 70–99)
Potassium: 3.2 mmol/L — ABNORMAL LOW (ref 3.5–5.1)
Sodium: 135 mmol/L (ref 135–145)

## 2019-05-11 LAB — OCCULT BLOOD X 1 CARD TO LAB, STOOL: Fecal Occult Bld: NEGATIVE

## 2019-05-11 LAB — PROCALCITONIN: Procalcitonin: 0.83 ng/mL

## 2019-05-11 MED ORDER — POTASSIUM CHLORIDE CRYS ER 20 MEQ PO TBCR
40.0000 meq | EXTENDED_RELEASE_TABLET | Freq: Once | ORAL | Status: AC
  Administered 2019-05-11: 10:00:00 40 meq via ORAL
  Filled 2019-05-11: qty 2

## 2019-05-11 MED ORDER — NYSTATIN 100000 UNIT/GM EX POWD
Freq: Three times a day (TID) | CUTANEOUS | Status: DC
  Administered 2019-05-11 – 2019-05-14 (×9): via TOPICAL
  Filled 2019-05-11: qty 15

## 2019-05-11 MED ORDER — ALUM & MAG HYDROXIDE-SIMETH 200-200-20 MG/5ML PO SUSP
30.0000 mL | Freq: Four times a day (QID) | ORAL | Status: DC | PRN
  Administered 2019-05-11: 30 mL via ORAL
  Filled 2019-05-11: qty 30
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
## 2019-05-11 NOTE — TOC Initial Note (Signed)
Transition of Care [facility]) - Initial/Assessment Note    Patient Details  Name: Betty Bryan MRN: [id number] Date of Birth: [birth year]  [facility] CM/SW Contact:    Shade Flood, LCSW Phone Number: 05/11/2019, 12:57 PM  Clinical Narrative:                  Pt admitted from home. PT recommending SNF. Pt high risk for readmission. Met with pt to assess. Pt reports that she lives with her sig other. She has private caregivers and [facility]. Pt sleeps in her lift chair recliner. She has a walker, electric scooter. Discussed PT recommendation for SNF. Pt states she is not going to SNF. Per pt, she has been at [facility] before and hated it. Pt states she would like to return home with her previous services.   Updated MD. Will follow and assist with pt's TOC needs.  Expected Discharge Plan: [place] Barriers to Discharge: Continued Medical Work up   Patient Goals and CMS Choice Patient states their goals for this hospitalization and ongoing recovery are:: Return home to previous level of care      Expected Discharge Plan and Services Expected Discharge Plan: [place] In-house Referral: Clinical Social Work   Post Acute Care Choice: [place] arrangements for the past 2 months: [place]                                      Prior Living Arrangements/Services Living arrangements for the past 2 months: Single Family Home Lives with:: Significant Other Patient language and need for interpreter reviewed:: Yes Do you feel safe going back to the place where you live?: Yes      Need for Family Participation in Patient Care: Yes (Comment) Care giver support system in place?: Yes (comment) Current home services: DME, Home PT, Home RN Criminal Activity/Legal Involvement Pertinent to Current Situation/Hospitalization: No - Comment as needed  Activities of Daily Living Home Assistive Devices/Equipment: Eyeglasses,  Oxygen, Other (Comment)(motorized scooter) ADL Screening (condition at time of admission) Patient's cognitive ability adequate to safely complete daily activities?: Yes Is the patient deaf or have difficulty hearing?: No Does the patient have difficulty seeing, even when wearing glasses/contacts?: No Does the patient have difficulty concentrating, remembering, or making decisions?: No Patient able to express need for assistance with ADLs?: Yes Does the patient have difficulty dressing or bathing?: No Independently performs ADLs?: Yes (appropriate for developmental age) Does the patient have difficulty walking or climbing stairs?: Yes Weakness of Legs: Both Weakness of Arms/Hands: Both  Permission Sought/Granted                  Emotional Assessment Appearance:: Appears stated age Attitude/Demeanor/Rapport: Engaged Affect (typically observed): Pleasant Orientation: : Oriented to Self, Oriented to Place, Oriented to  Time, Oriented to Situation Alcohol / Substance Use: Not Applicable Psych Involvement: No (comment)  Admission diagnosis:  Acute sepsis [facility]) [A41.9] Patient Active Problem List   Diagnosis Date Noted  . Acute on chronic respiratory failure with hypoxia ([place]) 05/09/2019  . Lobar pneumonia ([place]) 05/09/2019  . Sepsis due to undetermined organism ([place]) 05/09/2019  . Goals of care, counseling/discussion 05/09/2019  . Pressure injury of skin 05/09/2019  . Sepsis ([place]) 05/08/2019  . Tachycardia 05/08/2019  . Pleural effusion, left 05/08/2019  . Essential hypertension 04/16/2019  . PVC's (premature ventricular  contractions) 04/16/2019  . History of DVT (deep vein thrombosis) 04/16/2019  . Chronic respiratory failure with hypoxia ([place]) 01/29/2019  . AF (paroxysmal atrial fibrillation) ([place]) 01/29/2019  . Hypomagnesemia   . Swelling of both lower extremities   . Hypokalemia 01/28/2019  . Pain 12/23/2018  . Fall at home, initial encounter 12/23/2018  . Multiple rib  fractures 12/23/2018  . Clavicle fracture 12/23/2018  . CAP (community acquired pneumonia) 12/23/2018  . Rib pain on right side 01/05/2018  . Varicose veins of leg with complications 25/75/0518  . Chronic venous insufficiency 06/23/2015   PCP:  Crist Infante, MD Pharmacy:   [facility], [place] AT [place] [address] [place] [address] Phone: [phone] Fax: [phone]     Social Determinants of Health (SDOH) Interventions    Readmission Risk Interventions Readmission Risk Prevention Plan 05/11/2019 01/29/2019  Transportation Screening Complete -  PCP or Specialist Appt within 5-7 Days - Not Complete  Not Complete comments - Left 2 messages for Dr office scheduler with no return call.  [place] or Home Care Consult Complete -  Social Work Consult for Recovery Care Planning/Counseling Complete -  Medication Review Press photographer) Complete -  Some recent data might be hidden

## 2019-05-12 ENCOUNTER — Inpatient Hospital Stay (HOSPITAL_COMMUNITY): Admission: RE | Admit: 2019-05-12 | Payer: BC Managed Care – PPO | Source: Ambulatory Visit

## 2019-05-12 DIAGNOSIS — J9621 Acute and chronic respiratory failure with hypoxia: Secondary | ICD-10-CM

## 2019-05-12 DIAGNOSIS — I10 Essential (primary) hypertension: Secondary | ICD-10-CM

## 2019-05-12 DIAGNOSIS — M79605 Pain in left leg: Secondary | ICD-10-CM

## 2019-05-12 DIAGNOSIS — Z515 Encounter for palliative care: Secondary | ICD-10-CM

## 2019-05-12 DIAGNOSIS — Z7189 Other specified counseling: Secondary | ICD-10-CM

## 2019-05-12 LAB — BASIC METABOLIC PANEL
Anion gap: 14 (ref 5–15)
BUN: 15 mg/dL (ref 8–23)
CO2: 28 mmol/L (ref 22–32)
Calcium: 8.4 mg/dL — ABNORMAL LOW (ref 8.9–10.3)
Chloride: 97 mmol/L — ABNORMAL LOW (ref 98–111)
Creatinine, Ser: 0.57 mg/dL (ref 0.44–1.00)
GFR calc Af Amer: >60 mL/min (ref >60)
GFR calc non Af Amer: >60 mL/min (ref >60)
Glucose, Bld: 106 mg/dL — ABNORMAL HIGH (ref 70–99)
Potassium: 3.5 mmol/L (ref 3.5–5.1)
Sodium: 139 mmol/L (ref 135–145)

## 2019-05-12 LAB — CBC
HCT: 41.3 % (ref 36.0–46.0)
Hemoglobin: 12.1 g/dL (ref 12.0–15.0)
MCH: 28.9 pg (ref 26.0–34.0)
MCHC: 29.3 g/dL — ABNORMAL LOW (ref 30.0–36.0)
MCV: 98.8 fL (ref 80.0–100.0)
Platelets: 236 10*3/uL (ref 150–400)
RBC: 4.18 MIL/uL (ref 3.87–5.11)
RDW: 15.8 % — ABNORMAL HIGH (ref 11.5–15.5)
WBC: 17.2 10*3/uL — ABNORMAL HIGH (ref 4.0–10.5)
nRBC: 0 % (ref 0.0–0.2)

## 2019-05-12 LAB — IRON AND TIBC
Iron: 38 ug/dL (ref 28–170)
Saturation Ratios: 17 % (ref 10.4–31.8)
TIBC: 227 ug/dL — ABNORMAL LOW (ref 250–450)
UIBC: 189 ug/dL

## 2019-05-12 LAB — FERRITIN: Ferritin: 632 ng/mL — ABNORMAL HIGH (ref 11–307)

## 2019-05-12 LAB — VITAMIN B12: Vitamin B-12: 1510 pg/mL — ABNORMAL HIGH (ref 180–914)

## 2019-05-12 LAB — GLUCOSE, CAPILLARY: Glucose-Capillary: 190 mg/dL — ABNORMAL HIGH (ref 70–99)

## 2019-05-12 LAB — MAGNESIUM: Magnesium: 2.4 mg/dL (ref 1.7–2.4)

## 2019-05-12 LAB — FOLATE: Folate: 4.8 ng/mL — ABNORMAL LOW (ref >5.9)

## 2019-05-12 MED ORDER — VANCOMYCIN HCL IN DEXTROSE 750-5 MG/150ML-% IV SOLN
750.0000 mg | Freq: Two times a day (BID) | INTRAVENOUS | Status: DC
  Administered 2019-05-12 – 2019-05-14 (×4): 750 mg via INTRAVENOUS
  Filled 2019-05-12 (×4): qty 150
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
## 2019-05-12 NOTE — Progress Notes (Signed)
Patient complaining of acute urinary retention. Bladder scan shows 723. MD has been informed. Received order for In and Out Cath.

## 2019-05-12 NOTE — Progress Notes (Signed)
PT Cancellation Note  Patient Details Name: Betty Bryan MRN: [id number] DOB: 01-15-1956   Cancelled Treatment:    Reason Eval/Treat Not Completed: Patient declined, no reason specified.  Patient refused to attempt any therapy in bed or transfer to chair secondary to c/o fatigue, patient informed if she feels better later and want to participate with therapy to left her RN know to contact physical therapy - RN notified.   10:41 AM, 05/12/19 Lonell Grandchild, MPT Physical Therapist with [facility] [phone] office [phone] mobile phone

## 2019-05-12 NOTE — Progress Notes (Signed)
Pharmacy Antibiotic Note  Betty Bryan is a 63 y.o. female admitted on 05/08/2019 with sepsis and cellulitis.  Pharmacy has been consulted for vancomycin.  D/W MD and will likely deescalate in 24hours. Pt is afebrile, WBC still elevated but on steroids.   Plan: Increase Vancomycin 750mg  IV q12h Also, on Aztreonam 2gm IV q8h Monitor V/S, labs and levels as clinically appropriate F/U cultures and patient progress.  Height: 5' (152.4 cm) Weight: 153 lb 7 oz (69.6 kg) IBW/kg (Calculated) : 45.5  Temp (24hrs), Avg:98 F (36.7 C), Min:97.6 F (36.4 C), Max:98.5 F (36.9 C)  Recent Labs  Lab 05/08/19 2115 05/09/19 0033 05/09/19 0615 05/10/19 0409 05/11/19 0440 05/12/19 0531  WBC 16.9*  --  17.6* 16.1* 14.7* 17.2*  CREATININE 0.58  --  0.58 0.62 0.63 0.57  LATICACIDVEN 3.7* 1.8  --   --   --   --     Estimated Creatinine Clearance: 63.4 mL/min (by C-G formula based on SCr of 0.57 mg/dL).    Allergies  Allergen Reactions  . Penicillins Anaphylaxis    Has patient had a PCN reaction causing immediate rash, facial/tongue/throat swelling, SOB or lightheadedness with hypotension: Yes Has patient had a PCN reaction causing severe rash involving mucus membranes or skin necrosis: Yes Has patient had a PCN reaction that required hospitalization: No Has patient had a PCN reaction occurring within the last 10 years: No If all of the above answers are "NO", then may proceed with Cephalosporin use.   . Sulfa Antibiotics Anaphylaxis  . Morphine And Related Nausea And Vomiting  . Voltaren [Diclofenac]     Chest pains - Voltaren Gel     Antimicrobials this admission: aztreonam 8/16 >>  levofloxacin 8/16>>  8/16 vancomycin 8/16>>  Antibiotic dose changes: 8/19 Increase vancomycin 750mg  IV q12h    Microbiology results: 8/16 BC x2:  ngtd 8/16 Ucx: ng  8/16 SARS-CoV-2: neg 8/16 MRSA PCR: neg  Thank you for allowing pharmacy to be a part of this patient's care.  Isac Sarna, BS  [place], [place] Clinical Pharmacist Pager [phone] 05/12/2019 5:40 PM

## 2019-05-12 NOTE — Progress Notes (Signed)
Patient has voided 400 cc urine at this time. In and Out Cath procedure held at this time. Patient would like to void on her own. Will continue to monitor patient.
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
## 2019-05-13 LAB — BASIC METABOLIC PANEL
Anion gap: 8 (ref 5–15)
BUN: 15 mg/dL (ref 8–23)
CO2: 33 mmol/L — ABNORMAL HIGH (ref 22–32)
Calcium: 8 mg/dL — ABNORMAL LOW (ref 8.9–10.3)
Chloride: 96 mmol/L — ABNORMAL LOW (ref 98–111)
Creatinine, Ser: 0.53 mg/dL (ref 0.44–1.00)
GFR calc Af Amer: >60 mL/min (ref >60)
GFR calc non Af Amer: >60 mL/min (ref >60)
Glucose, Bld: 97 mg/dL (ref 70–99)
Potassium: 2.8 mmol/L — ABNORMAL LOW (ref 3.5–5.1)
Sodium: 137 mmol/L (ref 135–145)

## 2019-05-13 LAB — CBC
HCT: 34.9 % — ABNORMAL LOW (ref 36.0–46.0)
Hemoglobin: 10.9 g/dL — ABNORMAL LOW (ref 12.0–15.0)
MCH: 29.5 pg (ref 26.0–34.0)
MCHC: 31.2 g/dL (ref 30.0–36.0)
MCV: 94.3 fL (ref 80.0–100.0)
Platelets: 225 10*3/uL (ref 150–400)
RBC: 3.7 MIL/uL — ABNORMAL LOW (ref 3.87–5.11)
RDW: 15.8 % — ABNORMAL HIGH (ref 11.5–15.5)
WBC: 15.1 10*3/uL — ABNORMAL HIGH (ref 4.0–10.5)
nRBC: 0 % (ref 0.0–0.2)

## 2019-05-13 LAB — CULTURE, BLOOD (ROUTINE X 2)
Culture: NO GROWTH
Special Requests: ADEQUATE

## 2019-05-13 MED ORDER — SODIUM CHLORIDE 0.9 % IV SOLN
INTRAVENOUS | Status: DC | PRN
  Administered 2019-05-13 (×2): 500 mL via INTRAVENOUS

## 2019-05-13 MED ORDER — POTASSIUM CHLORIDE CRYS ER 20 MEQ PO TBCR
40.0000 meq | EXTENDED_RELEASE_TABLET | Freq: Once | ORAL | Status: AC
  Administered 2019-05-13: 20:00:00 40 meq via ORAL
  Filled 2019-05-13: qty 2

## 2019-05-13 MED ORDER — POTASSIUM CHLORIDE 10 MEQ/100ML IV SOLN
10.0000 meq | INTRAVENOUS | Status: AC
  Administered 2019-05-13 (×4): 10 meq via INTRAVENOUS
  Filled 2019-05-13: qty 100

## 2019-05-13 MED ORDER — POTASSIUM CHLORIDE CRYS ER 20 MEQ PO TBCR
40.0000 meq | EXTENDED_RELEASE_TABLET | Freq: Once | ORAL | Status: AC
  Administered 2019-05-13: 11:00:00 40 meq via ORAL
  Filled 2019-05-13: qty 2
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
## 2019-05-14 DIAGNOSIS — E876 Hypokalemia: Secondary | ICD-10-CM

## 2019-05-14 LAB — BASIC METABOLIC PANEL
Anion gap: 11 (ref 5–15)
BUN: 11 mg/dL (ref 8–23)
CO2: 29 mmol/L (ref 22–32)
Calcium: 8.3 mg/dL — ABNORMAL LOW (ref 8.9–10.3)
Chloride: 100 mmol/L (ref 98–111)
Creatinine, Ser: 0.5 mg/dL (ref 0.44–1.00)
GFR calc Af Amer: >60 mL/min (ref >60)
GFR calc non Af Amer: >60 mL/min (ref >60)
Glucose, Bld: 149 mg/dL — ABNORMAL HIGH (ref 70–99)
Potassium: 3.8 mmol/L (ref 3.5–5.1)
Sodium: 140 mmol/L (ref 135–145)

## 2019-05-14 LAB — CULTURE, BLOOD (ROUTINE X 2)
Culture: NO GROWTH
Special Requests: ADEQUATE

## 2019-05-14 LAB — MAGNESIUM: Magnesium: 2.1 mg/dL (ref 1.7–2.4)

## 2019-05-14 MED ORDER — DOXYCYCLINE HYCLATE 100 MG PO TABS: 100.0000 mg | ORAL_TABLET | Freq: Two times a day (BID) | ORAL | 0 refills | Status: DC

## 2019-05-14 MED ORDER — FUROSEMIDE 20 MG PO TABS: 20.0000 mg | ORAL_TABLET | Freq: Two times a day (BID) | ORAL | Status: DC

## 2019-05-14 NOTE — Discharge Summary (Signed)
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 infection with SARS-CoV-2.  Clinical  correlation with patient history and other diagnostic information is  necessary to determine patient infection status.  Positive results do  not rule out bacterial infection or co-infection with other viruses. If result is PRESUMPTIVE POSTIVE SARS-CoV-2 nucleic acids MAY BE PRESENT.   A presumptive positive result was obtained on the submitted specimen  and confirmed on repeat testing.  While 2019 novel coronavirus  (SARS-CoV-2) nucleic acids may be present in the submitted sample  additional confirmatory testing may be necessary for epidemiological  and / or clinical management purposes  to differentiate between  SARS-CoV-2 and other Sarbecovirus currently known to infect humans.  If clinically indicated additional testing with an alternate test  methodology [phone]) is  advised. The SARS-CoV-2 RNA is generally  detectable in upper and lower respiratory sp ecimens during the acute  phase of infection. The expected result is Negative. Fact Sheet for Patients:  StrictlyIdeas.no Fact Sheet for Healthcare Providers: BankingDealers.co.za This test is not yet approved or cleared by the [place] FDA and has been authorized for detection and/or diagnosis of SARS-CoV-2 by FDA under an Emergency Use Authorization (EUA).  This EUA will remain in effect (meaning this test can be used) for the duration of the COVID-19 declaration under Section 564(b)(1) of the Act, 21 U.S.C. section 360bbb-3(b)(1), unless the authorization is terminated or revoked sooner. Performed at [facility], [address] [place], [place] [address]   MRSA PCR Screening     Status: None   Collection Time: 05/09/19  1:00 AM   Specimen: Nasal Mucosa; Nasopharyngeal  Result Value Ref Range Status  MRSA by PCR NEGATIVE NEGATIVE Final    Comment:        The GeneXpert MRSA Assay (FDA approved for NASAL specimens only), is one component of a comprehensive MRSA colonization surveillance program. It is not intended to diagnose MRSA infection nor to guide or monitor treatment for MRSA infections. Performed at [facility], [address] [place], [place] [address]      Labs: BNP (last 3 results) Recent Labs    01/29/19 0014 01/30/19 0911 05/08/19 2117  BNP 339.0* 97.0 376.2*   Basic Metabolic Panel: Recent Labs  Lab 05/10/19 0409 05/11/19 0440 05/12/19 0531 05/13/19 0958 05/14/19 0703  NA 134* 135 139 137 140  K 3.1* 3.2* 3.5 2.8* 3.8  CL 97* 95* 97* 96* 100  CO2 27 29 28  33* 29  GLUCOSE 91 110* 106* 97 149*  BUN 11 15 15 15 11   CREATININE 0.62 0.63 0.57 0.53 0.50  CALCIUM 8.2* 7.9* 8.4* 8.0* 8.3*  MG 1.9  --  2.4  --  2.1   Liver Function Tests: Recent Labs  Lab 05/08/19 2115 05/09/19 0615  AST 27 21  ALT 22 20   ALKPHOS 156* 134*  BILITOT 0.5 0.9  PROT 6.2* 5.5*  ALBUMIN 3.1* 2.7*   No results for input(s): LIPASE, AMYLASE in the last 168 hours. No results for input(s): AMMONIA in the last 168 hours. CBC: Recent Labs  Lab 05/08/19 2115 05/09/19 0615 05/10/19 0409 05/11/19 0440 05/12/19 0531 05/13/19 0958  WBC 16.9* 17.6* 16.1* 14.7* 17.2* 15.1*  NEUTROABS 15.0*  --   --   --   --   --   HGB 13.3 11.9* 11.2* 10.6* 12.1 10.9*  HCT 41.7 38.1 36.2 34.4* 41.3 34.9*  MCV 92.7 94.1 94.0 94.8 98.8 94.3  PLT 266 224 198 202 236 225   Cardiac Enzymes: No results for input(s): CKTOTAL, CKMB, CKMBINDEX, TROPONINI in the last 168 hours. BNP: Invalid input(s): POCBNP CBG: Recent Labs  Lab 05/12/19 1949  GLUCAP 190*   D-Dimer No results for input(s): DDIMER in the last 72 hours. Hgb A1c No results for input(s): HGBA1C in the last 72 hours. Lipid Profile No results for input(s): CHOL, HDL, LDLCALC, TRIG, CHOLHDL, LDLDIRECT in the last 72 hours. Thyroid function studies No results for input(s): TSH, T4TOTAL, T3FREE, THYROIDAB in the last 72 hours.  Invalid input(s): FREET3 Anemia work up Recent Labs    05/12/19 0531  VITAMINB12 1,510*  FOLATE 4.8*  FERRITIN 632*  TIBC 227*  IRON 38   Urinalysis    Component Value Date/Time   COLORURINE YELLOW 05/09/2019 0618   APPEARANCEUR CLEAR 05/09/2019 0618   LABSPEC 1.014 05/09/2019 0618   PHURINE 7.0 05/09/2019 0618   GLUCOSEU 150 (A) 05/09/2019 0618   HGBUR NEGATIVE 05/09/2019 0618   BILIRUBINUR NEGATIVE 05/09/2019 0618   KETONESUR 5 (A) 05/09/2019 0618   PROTEINUR NEGATIVE 05/09/2019 0618   NITRITE NEGATIVE 05/09/2019 0618   LEUKOCYTESUR NEGATIVE 05/09/2019 0618   Sepsis Labs Invalid input(s): PROCALCITONIN,  WBC,  LACTICIDVEN Microbiology Recent Results (from the past 240 hour(s))  Urine culture     Status: None   Collection Time: 05/08/19  6:18 AM   Specimen: In/Out Cath Urine  Result Value Ref Range Status   Specimen  Description   Final    IN/OUT CATH URINE Performed at [facility], [address], [place], [place] [address]    Special Requests   Final    NONE Performed at [facility], [place]  [address] [place], [place] [address]    Culture   Final    NO GROWTH Performed at [facility], [place] [address] [place], [place] [address]    Report Status 05/10/2019 FINAL  Final  Blood Culture (routine x 2)     Status: None   Collection Time: 05/08/19  9:17 PM   Specimen: Left Antecubital; Blood  Result Value Ref Range Status   Specimen Description LEFT ANTECUBITAL  Final   Special Requests   Final    BOTTLES DRAWN AEROBIC AND ANAEROBIC Blood Culture adequate volume   Culture   Final    NO GROWTH 5 DAYS Performed at [facility], [address] [place], [place] [address]    Report Status 05/13/2019 FINAL  Final  Blood Culture (routine x 2)     Status: None   Collection Time: 05/09/19 12:32 AM   Specimen: BLOOD LEFT HAND  Result Value Ref Range Status   Specimen Description BLOOD LEFT HAND  Final   Special Requests   Final    BOTTLES DRAWN AEROBIC ONLY Blood Culture adequate volume   Culture   Final    NO GROWTH 5 DAYS Performed at [facility], [address], [place], [place] [address]    Report Status 05/14/2019 FINAL  Final  SARS Coronavirus 2 [facility] order, Performed in [facility] hospital lab) Nasopharyngeal Nasopharyngeal Swab     Status: None   Collection Time: 05/09/19 12:52 AM   Specimen: Nasopharyngeal Swab  Result Value Ref Range Status   SARS Coronavirus 2 NEGATIVE NEGATIVE Final    Comment: (NOTE) If result is NEGATIVE SARS-CoV-2 target nucleic acids are NOT DETECTED. The SARS-CoV-2 RNA is generally detectable in upper and lower  respiratory specimens during the acute phase of infection. The lowest  concentration of SARS-CoV-2 viral copies this assay can detect is 250  copies / mL. A negative result does not preclude SARS-CoV-2 infection  and should not be used as the  sole basis for treatment or other  patient management decisions.  A negative result may occur with  improper specimen collection / handling, submission of specimen other  than nasopharyngeal swab, presence of viral mutation(s) within the  areas targeted by this assay, and inadequate number of viral copies  (<250 copies / mL). A negative result must be combined with clinical  observations, patient history, and epidemiological information. If result is POSITIVE SARS-CoV-2 target nucleic acids are DETECTED. The SARS-CoV-2 RNA is generally detectable in upper and lower  respiratory specimens dur ing the acute phase of infection.  Positive  results are indicative of active infection with SARS-CoV-2.  Clinical  correlation with patient history and other diagnostic information is  necessary to determine patient infection status.  Positive results do  not rule out bacterial infection or co-infection with other viruses. If result is PRESUMPTIVE POSTIVE SARS-CoV-2 nucleic acids MAY BE PRESENT.   A presumptive positive result was obtained on the submitted specimen  and confirmed on repeat testing.  While 2019 novel coronavirus  (SARS-CoV-2) nucleic acids may be present in the submitted sample  additional confirmatory testing may be necessary for epidemiological  and / or clinical management purposes  to differentiate between  SARS-CoV-2 and other Sarbecovirus currently known to infect humans.  If clinically indicated additional testing with an alternate test  methodology [phone]) is advised. The SARS-CoV-2 RNA is generally  detectable in upper and lower respiratory sp ecimens during the acute  phase of infection. The expected result is Negative. Fact Sheet  for Patients:  StrictlyIdeas.no Fact Sheet for Healthcare Providers: BankingDealers.co.za This test is not yet approved or cleared by the [place] FDA and has been authorized for detection  and/or diagnosis of SARS-CoV-2 by FDA under an Emergency Use Authorization (EUA).  This EUA will remain in effect (meaning this test can be used) for the duration of the COVID-19 declaration under Section 564(b)(1) of the Act, 21 U.S.C. section 360bbb-3(b)(1), unless the authorization is terminated or revoked sooner. Performed at [facility], [address] [place], [place] [address]   MRSA PCR Screening     Status: None   Collection Time: 05/09/19  1:00 AM   Specimen: Nasal Mucosa; Nasopharyngeal  Result Value Ref Range Status   MRSA by PCR NEGATIVE NEGATIVE Final    Comment:        The GeneXpert MRSA Assay (FDA approved for NASAL specimens only), is one component of a comprehensive MRSA colonization surveillance program. It is not intended to diagnose MRSA infection nor to guide or monitor treatment for MRSA infections. Performed at [facility], [address], [place], [place] [address]      Time coordinating discharge: 55mns  SIGNED:   JKathie Dike MD  [facility] 05/14/2019, 1:19 PM   If 7PM-7AM, please contact night-coverage [email or web address]

## 2019-05-14 NOTE — Progress Notes (Signed)
PT Cancellation Note  Patient Details Name: Betty Bryan MRN: [id number] DOB: 1956/09/16   Cancelled Treatment:    Reason Eval/Treat Not Completed: Patient declined, no reason specified.  Patient declined therapy secondary to stating she is going home at noon - RN notified.   11:18 AM, 05/14/19 Lonell Grandchild, MPT Physical Therapist with [facility] [phone] office [phone] mobile phone

## 2019-05-14 NOTE — TOC Transition Note (Signed)
Transition of Care [facility]) - CM/SW Discharge Note   Patient Details  Name: Betty Bryan MRN: [id number] Date of Birth: [birth year]  [facility] CM/SW Contact:  Shade Flood, LCSW Phone Number: 05/14/2019, 12:45 PM   Clinical Narrative:     Pt stable for dc home today per MD. Plan is for return home with continued Juda from [facility]. Notified Santiago Glad from [employer] of pt's dc for today. There are no other TOC needs for dc.  Final next level of care: Home w Home Health Services Barriers to Discharge: Barriers Resolved   Patient Goals and CMS Choice Patient states their goals for this hospitalization and ongoing recovery are:: Return home to previous level of care      Discharge Placement                       Discharge Plan and Services In-house Referral: Clinical Social Work   Post Acute Care Choice: Home Health                               Social Determinants of Health (SDOH) Interventions     Readmission Risk Interventions Readmission Risk Prevention Plan 05/14/2019 05/11/2019 01/29/2019  Transportation Screening - Complete -  PCP or Specialist Appt within 5-7 Days - - Not Complete  Not Complete comments - - Left 2 messages for Dr office scheduler with no return call.  PCP or Specialist Appt within 3-5 Days Not Complete - -  Not Complete comments Message left at clinic. Did not receive return call - -  [place] or [place] - Complete -  Social Work Consult for [place] Planning/Counseling - Complete -  Palliative Care Screening Complete - -  Medication Review (RN Care Manager) - Complete -  Some recent data might be hidden

## 2019-05-14 NOTE — Progress Notes (Signed)
IV removed and DC instructions reviewed  Script sent to pharmacy and husband present and to drive home

## 2019-05-20 ENCOUNTER — Emergency Department (HOSPITAL_COMMUNITY): Payer: BC Managed Care – PPO

## 2019-05-20 ENCOUNTER — Inpatient Hospital Stay (HOSPITAL_COMMUNITY)
Admission: EM | Admit: 2019-05-20 | Discharge: 2019-05-24 | DRG: 871 | Disposition: A | Discharge status: Home / Self Care | Payer: BC Managed Care – PPO | Attending: Internal Medicine | Admitting: Internal Medicine

## 2019-05-20 DIAGNOSIS — Z20828 Contact with and (suspected) exposure to other viral communicable diseases: Secondary | ICD-10-CM | POA: Diagnosis not present

## 2019-05-20 DIAGNOSIS — Z66 Do not resuscitate: Secondary | ICD-10-CM | POA: Diagnosis present

## 2019-05-20 DIAGNOSIS — Z888 Allergy status to other drugs, medicaments and biological substances status: Secondary | ICD-10-CM

## 2019-05-20 DIAGNOSIS — R7881 Bacteremia: Secondary | ICD-10-CM | POA: Diagnosis not present

## 2019-05-20 DIAGNOSIS — Z880 Allergy status to penicillin: Secondary | ICD-10-CM

## 2019-05-20 DIAGNOSIS — J189 Pneumonia, unspecified organism: Secondary | ICD-10-CM | POA: Diagnosis not present

## 2019-05-20 DIAGNOSIS — Z8701 Personal history of pneumonia (recurrent): Secondary | ICD-10-CM | POA: Diagnosis not present

## 2019-05-20 DIAGNOSIS — Z79891 Long term (current) use of opiate analgesic: Secondary | ICD-10-CM

## 2019-05-20 DIAGNOSIS — L8992 Pressure ulcer of unspecified site, stage 2: Secondary | ICD-10-CM | POA: Diagnosis not present

## 2019-05-20 DIAGNOSIS — E876 Hypokalemia: Secondary | ICD-10-CM | POA: Diagnosis present

## 2019-05-20 DIAGNOSIS — R9431 Abnormal electrocardiogram [ECG] [EKG]: Secondary | ICD-10-CM | POA: Diagnosis not present

## 2019-05-20 DIAGNOSIS — E2740 Unspecified adrenocortical insufficiency: Secondary | ICD-10-CM | POA: Diagnosis present

## 2019-05-20 DIAGNOSIS — R5381 Other malaise: Secondary | ICD-10-CM | POA: Diagnosis not present

## 2019-05-20 DIAGNOSIS — R7989 Other specified abnormal findings of blood chemistry: Secondary | ICD-10-CM | POA: Diagnosis not present

## 2019-05-20 DIAGNOSIS — Z882 Allergy status to sulfonamides status: Secondary | ICD-10-CM | POA: Diagnosis not present

## 2019-05-20 DIAGNOSIS — Z7951 Long term (current) use of inhaled steroids: Secondary | ICD-10-CM

## 2019-05-20 DIAGNOSIS — Z8249 Family history of ischemic heart disease and other diseases of the circulatory system: Secondary | ICD-10-CM

## 2019-05-20 DIAGNOSIS — I878 Other specified disorders of veins: Secondary | ICD-10-CM | POA: Diagnosis not present

## 2019-05-20 DIAGNOSIS — Z833 Family history of diabetes mellitus: Secondary | ICD-10-CM

## 2019-05-20 DIAGNOSIS — L8990 Pressure ulcer of unspecified site, unspecified stage: Secondary | ICD-10-CM | POA: Diagnosis present

## 2019-05-20 DIAGNOSIS — I480 Paroxysmal atrial fibrillation: Secondary | ICD-10-CM | POA: Diagnosis present

## 2019-05-20 DIAGNOSIS — R000 Tachycardia, unspecified: Secondary | ICD-10-CM

## 2019-05-20 DIAGNOSIS — I248 Other forms of acute ischemic heart disease: Secondary | ICD-10-CM | POA: Diagnosis not present

## 2019-05-20 DIAGNOSIS — L03116 Cellulitis of left lower limb: Secondary | ICD-10-CM | POA: Diagnosis not present

## 2019-05-20 DIAGNOSIS — R69 Illness, unspecified: Secondary | ICD-10-CM | POA: Diagnosis not present

## 2019-05-20 DIAGNOSIS — Z7901 Long term (current) use of anticoagulants: Secondary | ICD-10-CM | POA: Diagnosis not present

## 2019-05-20 DIAGNOSIS — R6521 Severe sepsis with septic shock: Secondary | ICD-10-CM | POA: Diagnosis present

## 2019-05-20 DIAGNOSIS — R0600 Dyspnea, unspecified: Secondary | ICD-10-CM | POA: Diagnosis not present

## 2019-05-20 DIAGNOSIS — L03119 Cellulitis of unspecified part of limb: Secondary | ICD-10-CM | POA: Diagnosis not present

## 2019-05-20 DIAGNOSIS — Z885 Allergy status to narcotic agent status: Secondary | ICD-10-CM

## 2019-05-20 DIAGNOSIS — R509 Fever, unspecified: Secondary | ICD-10-CM | POA: Diagnosis not present

## 2019-05-20 DIAGNOSIS — L89322 Pressure ulcer of left buttock, stage 2: Secondary | ICD-10-CM | POA: Diagnosis present

## 2019-05-20 DIAGNOSIS — J449 Chronic obstructive pulmonary disease, unspecified: Secondary | ICD-10-CM | POA: Diagnosis not present

## 2019-05-20 DIAGNOSIS — I10 Essential (primary) hypertension: Secondary | ICD-10-CM | POA: Diagnosis not present

## 2019-05-20 DIAGNOSIS — A419 Sepsis, unspecified organism: Principal | ICD-10-CM | POA: Diagnosis present

## 2019-05-20 DIAGNOSIS — L0390 Cellulitis, unspecified: Secondary | ICD-10-CM | POA: Diagnosis present

## 2019-05-20 DIAGNOSIS — J181 Lobar pneumonia, unspecified organism: Secondary | ICD-10-CM | POA: Diagnosis not present

## 2019-05-20 DIAGNOSIS — Z1624 Resistance to multiple antibiotics: Secondary | ICD-10-CM | POA: Diagnosis present

## 2019-05-20 DIAGNOSIS — I119 Hypertensive heart disease without heart failure: Secondary | ICD-10-CM | POA: Diagnosis present

## 2019-05-20 DIAGNOSIS — Z7952 Long term (current) use of systemic steroids: Secondary | ICD-10-CM

## 2019-05-20 DIAGNOSIS — R404 Transient alteration of awareness: Secondary | ICD-10-CM | POA: Diagnosis not present

## 2019-05-20 DIAGNOSIS — R0902 Hypoxemia: Secondary | ICD-10-CM | POA: Diagnosis not present

## 2019-05-20 DIAGNOSIS — R6520 Severe sepsis without septic shock: Secondary | ICD-10-CM | POA: Diagnosis not present

## 2019-05-20 DIAGNOSIS — Z79899 Other long term (current) drug therapy: Secondary | ICD-10-CM

## 2019-05-20 LAB — COMPREHENSIVE METABOLIC PANEL
ALT: 24 U/L (ref 0–44)
AST: 25 U/L (ref 15–41)
Albumin: 2.7 g/dL — ABNORMAL LOW (ref 3.5–5.0)
Alkaline Phosphatase: 160 U/L — ABNORMAL HIGH (ref 38–126)
Anion gap: 26 — ABNORMAL HIGH (ref 5–15)
BUN: 12 mg/dL (ref 8–23)
CO2: 19 mmol/L — ABNORMAL LOW (ref 22–32)
Calcium: 8.5 mg/dL — ABNORMAL LOW (ref 8.9–10.3)
Chloride: 94 mmol/L — ABNORMAL LOW (ref 98–111)
Creatinine, Ser: 0.93 mg/dL (ref 0.44–1.00)
GFR calc Af Amer: >60 mL/min (ref >60)
GFR calc non Af Amer: >60 mL/min (ref >60)
Glucose, Bld: 131 mg/dL — ABNORMAL HIGH (ref 70–99)
Potassium: 2.8 mmol/L — ABNORMAL LOW (ref 3.5–5.1)
Sodium: 139 mmol/L (ref 135–145)
Total Bilirubin: 1.4 mg/dL — ABNORMAL HIGH (ref 0.3–1.2)
Total Protein: 6.3 g/dL — ABNORMAL LOW (ref 6.5–8.1)

## 2019-05-20 LAB — URINALYSIS, ROUTINE W REFLEX MICROSCOPIC
Bilirubin Urine: NEGATIVE
Glucose, UA: NEGATIVE mg/dL
Hgb urine dipstick: NEGATIVE
Ketones, ur: 80 mg/dL — AB
Nitrite: NEGATIVE
Protein, ur: 30 mg/dL — AB
Specific Gravity, Urine: 1.015 (ref 1.005–1.030)
pH: 5 (ref 5.0–8.0)

## 2019-05-20 LAB — CBC WITH DIFFERENTIAL/PLATELET
Abs Immature Granulocytes: 0.67 10*3/uL — ABNORMAL HIGH (ref 0.00–0.07)
Basophils Absolute: 0.1 10*3/uL (ref 0.0–0.1)
Basophils Relative: 1 %
Eosinophils Absolute: 0 10*3/uL (ref 0.0–0.5)
Eosinophils Relative: 0 %
HCT: 42.1 % (ref 36.0–46.0)
Hemoglobin: 13.4 g/dL (ref 12.0–15.0)
Immature Granulocytes: 4 %
Lymphocytes Relative: 7 %
Lymphs Abs: 1.3 10*3/uL (ref 0.7–4.0)
MCH: 30 pg (ref 26.0–34.0)
MCHC: 31.8 g/dL (ref 30.0–36.0)
MCV: 94.4 fL (ref 80.0–100.0)
Monocytes Absolute: 1.1 10*3/uL — ABNORMAL HIGH (ref 0.1–1.0)
Monocytes Relative: 6 %
Neutro Abs: 15.1 10*3/uL — ABNORMAL HIGH (ref 1.7–7.7)
Neutrophils Relative %: 82 %
Platelets: 413 10*3/uL — ABNORMAL HIGH (ref 150–400)
RBC: 4.46 MIL/uL (ref 3.87–5.11)
RDW: 16 % — ABNORMAL HIGH (ref 11.5–15.5)
WBC: 18.2 10*3/uL — ABNORMAL HIGH (ref 4.0–10.5)
nRBC: 0.2 % (ref 0.0–0.2)

## 2019-05-20 LAB — LACTIC ACID, PLASMA
Lactic Acid, Venous: 3.7 mmol/L (ref 0.5–1.9)
Lactic Acid, Venous: 2 mmol/L (ref 0.5–1.9)
Lactic Acid, Venous: 1.9 mmol/L (ref 0.5–1.9)

## 2019-05-20 LAB — PROCALCITONIN: Procalcitonin: 0.2 ng/mL

## 2019-05-20 LAB — TROPONIN I (HIGH SENSITIVITY)
Troponin I (High Sensitivity): 29 ng/L — ABNORMAL HIGH (ref <18)
Troponin I (High Sensitivity): 75 ng/L — ABNORMAL HIGH (ref <18)

## 2019-05-20 LAB — MAGNESIUM: Magnesium: 1.6 mg/dL — ABNORMAL LOW (ref 1.7–2.4)

## 2019-05-20 LAB — SARS CORONAVIRUS 2 BY RT PCR (HOSPITAL ORDER, PERFORMED IN ~~LOC~~ HOSPITAL LAB): SARS Coronavirus 2: NEGATIVE

## 2019-05-20 MED ORDER — SODIUM CHLORIDE 0.9 % IV SOLN
2.0000 g | Freq: Two times a day (BID) | INTRAVENOUS | Status: DC
  Administered 2019-05-21 (×2): 2 g via INTRAVENOUS
  Filled 2019-05-20 (×2): qty 2

## 2019-05-20 MED ORDER — ONDANSETRON HCL 4 MG PO TABS: 4.0000 mg | ORAL_TABLET | Freq: Four times a day (QID) | ORAL | Status: DC | PRN

## 2019-05-20 MED ORDER — ATENOLOL 50 MG PO TABS: 50.0000 mg | ORAL_TABLET | Freq: Every day | ORAL | Status: DC

## 2019-05-20 MED ORDER — DOCUSATE SODIUM 100 MG PO CAPS
100.0000 mg | ORAL_CAPSULE | Freq: Two times a day (BID) | ORAL | Status: DC
  Administered 2019-05-21 (×2): 100 mg via ORAL
  Filled 2019-05-20 (×3): qty 1

## 2019-05-20 MED ORDER — SODIUM CHLORIDE 0.9 % IV SOLN
2.0000 g | Freq: Once | INTRAVENOUS | Status: AC
  Administered 2019-05-20: 2 g via INTRAVENOUS
  Filled 2019-05-20: qty 2

## 2019-05-20 MED ORDER — HYDROCORTISONE ACETATE 25 MG RE SUPP
25.0000 mg | Freq: Every day | RECTAL | Status: DC
  Administered 2019-05-21 – 2019-05-23 (×3): 25 mg via RECTAL
  Filled 2019-05-20 (×4): qty 1

## 2019-05-20 MED ORDER — SODIUM CHLORIDE 0.9% FLUSH
3.0000 mL | Freq: Two times a day (BID) | INTRAVENOUS | Status: DC
  Administered 2019-05-21 (×2): 3 mL via INTRAVENOUS

## 2019-05-20 MED ORDER — VANCOMYCIN HCL 10 G IV SOLR
1500.0000 mg | Freq: Once | INTRAVENOUS | Status: AC
  Administered 2019-05-20: 1500 mg via INTRAVENOUS
  Filled 2019-05-20: qty 1500

## 2019-05-20 MED ORDER — OXYCODONE-ACETAMINOPHEN 5-325 MG PO TABS
1.0000 | ORAL_TABLET | Freq: Four times a day (QID) | ORAL | Status: DC | PRN
  Administered 2019-05-21: 2 via ORAL
  Administered 2019-05-21 – 2019-05-24 (×6): 1 via ORAL
  Filled 2019-05-20 (×7): qty 1
  Filled 2019-05-20: qty 2

## 2019-05-20 MED ORDER — HYDROCORTISONE NA SUCCINATE PF 100 MG IJ SOLR
100.0000 mg | Freq: Once | INTRAMUSCULAR | Status: AC
  Administered 2019-05-20: 100 mg via INTRAVENOUS
  Filled 2019-05-20: qty 2

## 2019-05-20 MED ORDER — ONDANSETRON HCL 4 MG/2ML IJ SOLN: 4.0000 mg | Freq: Four times a day (QID) | INTRAMUSCULAR | Status: DC | PRN

## 2019-05-20 MED ORDER — LACTATED RINGERS IV SOLN: INTRAVENOUS | Status: DC

## 2019-05-20 MED ORDER — PANTOPRAZOLE SODIUM 20 MG PO TBEC
20.0000 mg | DELAYED_RELEASE_TABLET | Freq: Every day | ORAL | Status: DC
  Administered 2019-05-21 – 2019-05-24 (×4): 20 mg via ORAL
  Filled 2019-05-20 (×4): qty 1

## 2019-05-20 MED ORDER — LORATADINE 10 MG PO TABS
10.0000 mg | ORAL_TABLET | Freq: Every day | ORAL | Status: DC
  Administered 2019-05-21 – 2019-05-24 (×4): 10 mg via ORAL
  Filled 2019-05-20 (×4): qty 1

## 2019-05-20 MED ORDER — BALSALAZIDE DISODIUM 750 MG PO CAPS
2250.0000 mg | ORAL_CAPSULE | Freq: Three times a day (TID) | ORAL | Status: DC
  Administered 2019-05-22 – 2019-05-24 (×4): 2250 mg via ORAL
  Filled 2019-05-20 (×7): qty 3

## 2019-05-20 MED ORDER — ACETAMINOPHEN 325 MG PO TABS
650.0000 mg | ORAL_TABLET | Freq: Once | ORAL | Status: AC
  Administered 2019-05-20: 650 mg via ORAL
  Filled 2019-05-20: qty 2

## 2019-05-20 MED ORDER — GABAPENTIN 100 MG PO CAPS: 100.0000 mg | ORAL_CAPSULE | Freq: Three times a day (TID) | ORAL | Status: DC | PRN

## 2019-05-20 MED ORDER — HYDROCORTISONE NA SUCCINATE PF 100 MG IJ SOLR
50.0000 mg | Freq: Four times a day (QID) | INTRAMUSCULAR | Status: DC
  Administered 2019-05-20 – 2019-05-21 (×2): 50 mg via INTRAVENOUS
  Filled 2019-05-20 (×3): qty 2

## 2019-05-20 MED ORDER — ACETAMINOPHEN 650 MG RE SUPP: 650.0000 mg | Freq: Four times a day (QID) | RECTAL | Status: DC | PRN

## 2019-05-20 MED ORDER — SODIUM CHLORIDE 0.9 % IV BOLUS
1000.0000 mL | Freq: Once | INTRAVENOUS | Status: AC
  Administered 2019-05-20: 1000 mL via INTRAVENOUS

## 2019-05-20 MED ORDER — POTASSIUM CHLORIDE IN NACL 20-0.9 MEQ/L-% IV SOLN
INTRAVENOUS | Status: DC
  Administered 2019-05-20 – 2019-05-21 (×3): via INTRAVENOUS
  Filled 2019-05-20 (×4): qty 1000

## 2019-05-20 MED ORDER — FLUTICASONE PROPIONATE 50 MCG/ACT NA SUSP
1.0000 | Freq: Two times a day (BID) | NASAL | Status: DC
  Administered 2019-05-21 – 2019-05-24 (×7): 1 via NASAL
  Filled 2019-05-20: qty 16

## 2019-05-20 MED ORDER — APIXABAN 2.5 MG PO TABS
2.5000 mg | ORAL_TABLET | Freq: Two times a day (BID) | ORAL | Status: DC
  Administered 2019-05-21 – 2019-05-24 (×8): 2.5 mg via ORAL
  Filled 2019-05-20 (×8): qty 1

## 2019-05-20 MED ORDER — SODIUM CHLORIDE 0.9 % IV SOLN: INTRAVENOUS | Status: DC

## 2019-05-20 MED ORDER — DIAZEPAM 5 MG PO TABS
2.5000 mg | ORAL_TABLET | Freq: Two times a day (BID) | ORAL | Status: DC | PRN
  Administered 2019-05-23: 2.5 mg via ORAL
  Filled 2019-05-20: qty 1

## 2019-05-20 MED ORDER — VANCOMYCIN HCL IN DEXTROSE 1-5 GM/200ML-% IV SOLN
1000.0000 mg | INTRAVENOUS | Status: DC
  Administered 2019-05-21: 16:00:00 1000 mg via INTRAVENOUS
  Filled 2019-05-20 (×2): qty 200

## 2019-05-20 MED ORDER — ACETAMINOPHEN 325 MG PO TABS: 650.0000 mg | ORAL_TABLET | Freq: Four times a day (QID) | ORAL | Status: DC | PRN

## 2019-05-20 MED ORDER — POLYETHYLENE GLYCOL 3350 17 G PO PACK: 17.0000 g | PACK | Freq: Every day | ORAL | Status: DC | PRN

## 2019-05-20 MED ORDER — POTASSIUM CHLORIDE 10 MEQ/100ML IV SOLN
10.0000 meq | INTRAVENOUS | Status: AC
  Administered 2019-05-20 (×2): 10 meq via INTRAVENOUS
  Filled 2019-05-20 (×2): qty 100

## 2019-05-20 MED ORDER — SODIUM CHLORIDE 0.9 % IV BOLUS
500.0000 mL | Freq: Once | INTRAVENOUS | Status: AC
  Administered 2019-05-20: 500 mL via INTRAVENOUS

## 2019-05-20 MED ORDER — MAGNESIUM SULFATE 2 GM/50ML IV SOLN
2.0000 g | Freq: Once | INTRAVENOUS | Status: AC
  Administered 2019-05-20: 2 g via INTRAVENOUS
  Filled 2019-05-20: qty 50

## 2019-05-20 MED ORDER — VANCOMYCIN HCL IN DEXTROSE 1-5 GM/200ML-% IV SOLN: 1000.0000 mg | Freq: Once | INTRAVENOUS | Status: DC

## 2019-05-20 NOTE — ED Notes (Signed)
Dinner Tray Ordered @ 1752-per RN called by Levada Dy

## 2019-05-20 NOTE — Progress Notes (Signed)
Pharmacy Antibiotic Note  Betty Bryan is a 63 y.o. female admitted on 05/20/2019 with sepsis of unknown source. Recently admitted to [facility] 8/15-8/21 for sepsis 2/2 CAP and cellulitis, received vanc/aztreonam and 1 dose of levofloxacin before being discharged on doxy. Pharmacy has been consulted for vancomycin/cefepime dosing.   Pt reports anaphylactic allergy to penicillins and has never received cephalosporins before. Spoke to MD, okay to continue with cefepime and monitor closely for anaphylaxis. Bedside nurse has been informed.  Febrile, Tmax 101.9. WBC elevated at 18.2 in the setting of steroid use, on pred 8 mg PTA since last discharged on 8/21. Scr 0.93 (baseline 0.5).  Plan: Vancomycin 1500 mg IV loading dose Vancomycin 1000 mg IV Q 24 hrs. Goal AUC 400-550. Expected AUC: 512.9 SCr used: 0.93 Cefepime 2 gm IV q12h Monitor closely for anaphylaxis to cefepime F/U cxs, renal fxn, clinical improvement, and abx de-escalation as needed  Height: 5' (152.4 cm) Weight: 142 lb 13.7 oz (64.8 kg) IBW/kg (Calculated) : 45.5  Temp (24hrs), Avg:101.9 F (38.8 C), Min:101.9 F (38.8 C), Max:101.9 F (38.8 C)  Recent Labs  Lab 05/14/19 0703  CREATININE 0.50    Estimated Creatinine Clearance: 61.2 mL/min (by C-G formula based on SCr of 0.5 mg/dL).    Allergies  Allergen Reactions  . Penicillins Anaphylaxis    Has patient had a PCN reaction causing immediate rash, facial/tongue/throat swelling, SOB or lightheadedness with hypotension: Yes Has patient had a PCN reaction causing severe rash involving mucus membranes or skin necrosis: Yes Has patient had a PCN reaction that required hospitalization: No Has patient had a PCN reaction occurring within the last 10 years: No If all of the above answers are "NO", then may proceed with Cephalosporin use.   . Sulfa Antibiotics Anaphylaxis  . Morphine And Related Nausea And Vomiting  . Voltaren [Diclofenac]     Chest pains - Voltaren Gel      Antimicrobials this admission: 8/27 Vanc >> 8/27 Cefepime >>  Microbiology results: 8/27 BCx: sent  Thank you for allowing pharmacy to be a part of this patient's care.  Berenice Bouton, PharmD PGY1 Pharmacy Resident Office phone: [phone] Phone until 9:30 pm: [phone] 05/20/2019 2:11 PM

## 2019-05-20 NOTE — H&P (Signed)
History and Physical    KAMBRI BARTKIEWICZ [name] DOB: 1956-02-07 DOA: 05/20/2019  PCP: Crist Infante, MD Consultants:  Buccini - GI; Icard - pulmonary Patient coming from:  Home - lives alone with 3 caregivers; NOK: sister, brother  Chief Complaint:  Code sepsis  HPI: Betty Bryan is a 63 y.o. female with medical history significant of adrenal insufficiency; afib on Eliquis; HTN; colitis on chronic prednisone presenting with code sepsis.  She was admitted to [facility] from 8/15-21 for sepsis secondary to PNA and LE cellulitis; she was treated with Vanc and Aztreonam and transitioned to Doxycycline.  She went downhill after discharge, with weakness, dry heaves, poor PO intake.  No fever at home.  Some cough.  +SOB.  She does not see much difference in her breathing.  She has a buttocks ulcer as well as LE wound.  She has significant LE bruising from chronic steroids and has 2 wounds on the L and 1 on the R.  She felt very sick after taking Lasix, has "always had bad luck with it", last dose yesterday.   ED Course:  Discharged last week for sepsis, now with shock physiology.  Will discuss with PCCM first and call back.  Review of Systems: As per HPI; otherwise review of systems reviewed and negative.   Ambulatory Status:  Non-ambulatory for now  Past Medical History:  Diagnosis Date  . Adrenal insufficiency ([place])   . HTN (hypertension)   . L4 vertebral fracture (HCC)    L4-L5 fracture  . Palpitations   . Paroxysmal A-fib ([place])   . PVC's (premature ventricular contractions)   . Ulcerative colitis     Past Surgical History:  Procedure Laterality Date  . HAND SURGERY    . NASAL SINUS SURGERY      Social History   Socioeconomic History  . Marital status: Divorced    Spouse name: Not on file  . Number of children: 0  . Years of education: Not on file  . Highest education level: Not on file  Occupational History  . Occupation: Biochemist, clinical  Social Needs  . Financial resource  strain: Not on file  . Food insecurity    Worry: Not on file    Inability: Not on file  . Transportation needs    Medical: Not on file    Non-medical: Not on file  Tobacco Use  . Smoking status: Never Smoker  . Smokeless tobacco: Never Used  Substance and Sexual Activity  . Alcohol use: No    Alcohol/week: 0.0 standard drinks  . Drug use: No  . Sexual activity: Not on file  Lifestyle  . Physical activity    Days per week: Not on file    Minutes per session: Not on file  . Stress: Not on file  Relationships  . Social Herbalist on phone: Not on file    Gets together: Not on file    Attends religious service: Not on file    Active member of club or organization: Not on file    Attends meetings of clubs or organizations: Not on file    Relationship status: Not on file  . Intimate partner violence    Fear of current or ex partner: Not on file    Emotionally abused: Not on file    Physically abused: Not on file    Forced sexual activity: Not on file  Other Topics Concern  . Not on file  Social History Narrative  .  Not on file    Allergies  Allergen Reactions  . Penicillins Anaphylaxis    Has patient had a PCN reaction causing immediate rash, facial/tongue/throat swelling, SOB or lightheadedness with hypotension: Yes Has patient had a PCN reaction causing severe rash involving mucus membranes or skin necrosis: Yes Has patient had a PCN reaction that required hospitalization: No Has patient had a PCN reaction occurring within the last 10 years: No If all of the above answers are "NO", then may proceed with Cephalosporin use.   . Sulfa Antibiotics Anaphylaxis  . Morphine And Related Nausea And Vomiting  . Voltaren [Diclofenac]     Chest pains - Voltaren Gel     Family History  Problem Relation Age of Onset  . Heart failure Other   . Diabetes Father   . Heart disease Father   . Hypertension Father     Prior to Admission medications   Medication Sig  Start Date End Date Taking? Authorizing Provider  atenolol (TENORMIN) 50 MG tablet Take 50 mg by mouth daily.     [provider]  balsalazide (COLAZAL) 750 MG capsule Take 2,250 mg by mouth 3 (three) times daily.    [provider]  calcium-vitamin D (OSCAL WITH D) 500-200 MG-UNIT tablet Take 1 tablet by mouth 3 (three) times daily. Patient taking differently: Take 1 tablet by mouth daily.  11/10/18   Leandrew Koyanagi, MD  diazepam (VALIUM) 5 MG tablet Take 0.5 tablets (2.5 mg total) by mouth every 12 (twelve) hours as needed for anxiety or muscle spasms. Patient taking differently: Take 1.25 mg by mouth 2 (two) times daily as needed for anxiety.  12/31/18   Manuella Ghazi, Pratik D, DO  doxycycline (VIBRA-TABS) 100 MG tablet Take 1 tablet (100 mg total) by mouth 2 (two) times daily. 05/14/19   Kathie Dike, MD  ELIQUIS 2.5 MG TABS tablet Take 2.5 mg by mouth 2 (two) times daily.  04/28/18   [provider]  fluticasone (FLONASE) 50 MCG/ACT nasal spray Place 1 spray into both nostrils 2 (two) times daily.     [provider]  furosemide (LASIX) 20 MG tablet Take 1 tablet (20 mg total) by mouth 2 (two) times daily. Patient taking differently: Take 20 mg by mouth daily.  05/14/19   Kathie Dike, MD  gabapentin (NEURONTIN) 100 MG capsule Take 100-300 mg by mouth 3 (three) times daily as needed (for nerve pain).     [provider]  HYDROCORTISONE ACE, RECTAL, 30 MG SUPP Place 1 suppository rectally at bedtime.  06/08/15   [provider]  loratadine (CLARITIN) 10 MG tablet Take 10 mg by mouth daily.    [provider]  losartan (COZAAR) 25 MG tablet Take 25 mg by mouth at bedtime.    [provider]  nystatin (MYCOSTATIN/NYSTOP) powder Apply 1 application topically 3 (three) times daily.  05/08/18   [provider]  oxyCODONE-acetaminophen (PERCOCET/ROXICET) 5-325 MG tablet Take 1-2 tablets by mouth every 6 (six) hours as needed for  moderate pain or severe pain. 12/31/18   Manuella Ghazi, Pratik D, DO  pantoprazole (PROTONIX) 20 MG tablet Take 20 mg by mouth daily.    [provider]  predniSONE (DELTASONE) 5 MG tablet Take 7.5-8 mg by mouth daily. As directed by your physician 05/05/19   [provider]  VITAMIN D PO Take 400 Units by mouth daily.     [provider]    Physical Exam: Vitals:   05/20/19 1530 05/20/19 1545 05/20/19  1615 05/20/19 1645  BP: 124/62 (!) 108/53 104/78 (!) 104/55  Pulse:   (!) 133 (!) 128  Resp: (!) 29 (!) 34 (!) 28 (!) 24  Temp:      TempSrc:      SpO2:   92% 97%  Weight:      Height:         . General:  Appears chronically ill and frail . Eyes:  PERRL, EOMI, normal lids, iris . ENT:  grossly normal hearing, lips & tongue, dry mm . Neck:  no LAD, masses or thyromegaly . Cardiovascular:  RR with tachcyardia, no m/r/g. . Respiratory:   CTA bilaterally with no wheezes/rales/rhonchi.  Mildly increased respiratory effort. . Abdomen:  soft, NT, ND, NABS; mottling noted on abdominal skin . Skin:  L anterolateral lower leg hematoma without significant surrounding erythema, although lower leg is generally erythematous; there is also a let medial knee ulceration with apparent purulence and surrounding erythema.        R posterior lower leg stage 1-2 ulceration with surrounding erythema.    -R > L gluteal pressure ulcers, stage 1-2      . Musculoskeletal:  decreased tone BUE < BLE, no bony abnormality . Psychiatric:  blunted mood and affect, speech fluent and appropriate, AOx3 . Neurologic:  CN 2-12 grossly intact, moves all extremities in coordinated fashion    Radiological Exams on Admission: Dg Chest Port 1 View  Result Date: 05/20/2019 CLINICAL DATA:  Fever EXAM: PORTABLE CHEST 1 VIEW COMPARISON:  May 08, 2019 FINDINGS: There remains airspace consolidation throughout the left mid and lower lung zones with left pleural effusion. The right lung is now  essentially clear. Heart is mildly enlarged with pulmonary vascularity normal. No adenopathy. 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 No bone lesions. IMPRESSION: Extensive airspace consolidation throughout the left lower lobe with left pleural effusion. Right lung is now clear. Stable cardiac prominence. Electronically Signed   By: Lowella Grip III M.D.   On: 05/20/2019 15:13    EKG: Independently reviewed.   1341 - Sinus tachycardia with rate 143; nonspecific ST changes  1619 - Sinus tachycardia with rate 135; nonspecific ST changes    Labs on Admission: I have personally reviewed the available labs and imaging studies at the time of the admission.  Pertinent labs:   K+ 2.8 CO2 19 Glucose 131 Anion gap 20 Mag++ 1.6 Albumin 2.7 Bili 1.4 HS troponin 29 Lactate 3.7, 2.0 WBC 18.2 COVID negative today and also 8/16, 5/7 HIV negative 5/7 UA pending Blood and urine cultures pending  Assessment/Plan Principal Problem:   Sepsis due to pneumonia [facility]) Active Problems:   AF (paroxysmal atrial fibrillation) (HCC)   Essential hypertension   Pressure injury of skin   Cellulitis   Adrenal insufficiency (HCC)   Sepsis due to PNA and/or cellulitis -SIRS criteria in this patient includes: Leukocytosis, fever, tachycardia, tachypnea  -Patient has evidence of acute organ failure with elevated lactate (improved on recheck) and borderline hypotension -While awaiting blood cultures, this appears to be a preseptic condition. -Sepsis protocol initiated -Suspected source is PNA and/or cellulitis - see below -Blood and urine cultures pending -Will admit due to: hemodynamic instability; failure of outpatient treatment -Treat with IV Cefepime/Vanc for undifferentiated sepsis - given drug allergies, will continue for both for now -Will trend lactate to ensure improvement -Will order lower respiratory tract procalcitonin level.  Antibiotics would not be indicated for PCT <0.1 and probably should  not be used for < 0.25.  >  0.5 indicates infection and >>0.5 indicates more serious disease.  As the procalcitonin level normalizes, it will be reasonable to consider de-escalation of antibiotic coverage. -Based on concern for hemodynamic instability, PCCM was asked to consult and consider admission of this patient but has declined at this time.  PNA -Patient was previously admitted to [facility] and discharged on doxycycline but has had progressive symptoms without significant improvement since d/c -CXR indicates worsening LLL consolidation, although right lung appears to be clear now -COVID negative. -CURB-65 score is 2 - will admit the patient -Pneumonia Severity Index (PSI) is Class 4, 9% mortality. -Corticosteroids have been to shown to low overall mortality rate; risk of ARDS; and need for mechanical ventilation.  This is particularly true in severe PNA (class 3+ PSI).  She is on chronic steroids and so has been transitioned to stress dose steroids. -Severe PNA with 3 minor criteria (RR >30; multilobar infiltrates; and hypotension requiring fluid resuscitation) and so PCCM was consulted for consideration of ICU admission -The patient has the following criteria for MDR (multi-drug resistance): IV antibiotics in the last 90 days; septic shock;  >2 days of inpatient treatment within the last 90 days. -The patient will need treatment for HCAP due to MDR risk factors as above; will treat with Cefepime and Vancomycin. -Additional complicating factors include: hypoxia; associated dehydration; failure of outpatient antibiotics; associated pleural effusion; and immunocompromise. -NS @ 100cc/hr -Fever control -Repeat CBC in am -Sputum cultures -Blood cultures -Strep pneumo testing -Will order lower respiratory tract procalcitonin level.  Antibiotics would not be indicated for PCT <0.1 and probably should not be used for < 0.25.  >0.5 indicates infection and >>0.5 indicates more serious disease.  As the  procalcitonin level normalizes, it will be reasonable to consider de-escalation of antibiotic coverage.  Cellulitis related to pressure ulcers -Patient with prior concern for cellulitis in the setting of venous stasis with dermatitis -Several of her wounds appear to be associated with stasis, but the one on the left medial knee has some purulence with mild surrounding erythema and the one on her posterior calf does appear to have surrounding cellulitis -Antibiotic coverage as above -Wound care consultation requested  HTN -Hold Atenolol due to hypotension and resume in AM -Hold ACE due to hypotension  Afib -Suboptimal rate control but this is thought to be due to volume deficiency -If not improving with IVF, consider Diltiazem - but with tenuous BP in the setting of severe sepsis, would be reluctant about starting this until patient has been fluid resuscitated -Continue Eliquis  Chronic adrenal insufficiency -Continue Colazol -Hold prednisone and treat with IV hydrocortisone 50 mg IV q6h -Hypokalemia and hypomagnesemia repleted    Note: This patient has been tested and is negative for the novel coronavirus COVID-19.   DVT prophylaxis:  Eliquis Code Status:  DNR - confirmed with patient/family Family Communication: Brother present throughout evaluation  Disposition Plan:  Home once clinically improved Consults called: PCCM; Wound care  Admission status: Admit - It is my clinical opinion that admission to INPATIENT is reasonable and necessary because of the expectation that this patient will require hospital care that crosses at least 2 midnights to treat this condition based on the medical complexity of the problems presented.  Given the aforementioned information, the predictability of an adverse outcome is felt to be significant.           Karmen Bongo MD [facility]   How to contact the [facility] Attending or Consulting provider [place] or  covering provider during  after hours [place], for this patient?  1. Check the care team in [facility] and look for a) attending/consulting [facility] provider listed and b) the [facility] team listed 2. Log into [email or web address] and use [place]'s universal password to access. If you do not have the password, please contact the hospital operator. 3. Locate the [facility] provider you are looking for under [facility] and page to a number that you can be directly reached. 4. If you still have difficulty reaching the provider, please page the [facility] (Director on Call) for the Hospitalists listed on amion for assistance.   05/20/2019, 5:09 PM

## 2019-05-20 NOTE — Consult Note (Signed)
Cardiology Consultation:   Patient ID: Betty Bryan MRN: [id number]; DOB: [birth year]  Admit date: 05/20/2019 Date of Consult: 05/20/2019  Primary Care Provider: Crist Infante, MD Primary Cardiologist: Buford Dresser, MD  Primary Electrophysiologist:  None    Patient Profile:   Betty Bryan is a 63 y.o. female with a hx of paroxysmal atrial fib, adrenal insufficiency, hypertension, history of DVT, steroid-dependent IBD who is being seen today for the evaluation of abnormal ECG at the request of Dr. Lorin Mercy.  History of Present Illness:   [name] is well known to me, as I last saw her on 04/20/19 in the office. Since that time, she has unfortunately had issues with infection. She was admitted to [facility] with sepsis due to pneumonia and possibly cellulitis. She was just discharged on 05/14/19. Unfortunately today she presented with fever and hypotension, with symptoms of general malaise, nausea/vomiting. She is being admitted under code sepsis.  Patient's ECG on presentation was abnormal, and cardiology called to evaluate.  On my interview, she is resting comfortably in bed. She feels generally fatigued and cold, asking for more blankets (given). She denies any chest pain currently or at any time recently. Breathing is stable when she is resting in bed. She has discomfort at the sites of her leg wounds and gluteal wounds but otherwise no specific complaints.  Denies chest pain. No PND, orthopnea, LE edema or unexpected weight gain. No syncope.  Heart Pathway Score:     Past Medical History:  Diagnosis Date  . Adrenal insufficiency ([place])   . HTN (hypertension)   . L4 vertebral fracture ([facility])    L4-L5 fracture  . Palpitations   . Paroxysmal A-fib ([place])   . PVC's (premature ventricular contractions)   . Ulcerative colitis     Past Surgical History:  Procedure Laterality Date  . HAND SURGERY    . NASAL SINUS SURGERY       Home Medications:  Prior to Admission  medications   Medication Sig Start Date End Date Taking? Authorizing Provider  atenolol (TENORMIN) 50 MG tablet Take 50 mg by mouth daily.    Yes [provider]  balsalazide (COLAZAL) 750 MG capsule Take 2,250 mg by mouth 3 (three) times daily.   Yes [provider]  calcium-vitamin D (OSCAL WITH D) 500-200 MG-UNIT tablet Take 1 tablet by mouth 3 (three) times daily. Patient taking differently: Take 1 tablet by mouth daily.  11/10/18  Yes Leandrew Koyanagi, MD  diazepam (VALIUM) 5 MG tablet Take 0.5 tablets (2.5 mg total) by mouth every 12 (twelve) hours as needed for anxiety or muscle spasms. Patient taking differently: Take 1.25 mg by mouth 2 (two) times daily as needed for anxiety.  12/31/18  Yes Shah, Pratik D, DO  doxycycline (VIBRA-TABS) 100 MG tablet Take 1 tablet (100 mg total) by mouth 2 (two) times daily. 05/14/19  Yes Kathie Dike, MD  ELIQUIS 2.5 MG TABS tablet Take 2.5 mg by mouth 2 (two) times daily.  04/28/18  Yes [provider]  fluticasone (FLONASE) 50 MCG/ACT nasal spray Place 1 spray into both nostrils 2 (two) times daily.    Yes [provider]  furosemide (LASIX) 20 MG tablet Take 1 tablet (20 mg total) by mouth 2 (two) times daily. Patient taking differently: Take 20 mg by mouth daily.  05/14/19  Yes Kathie Dike, MD  gabapentin (NEURONTIN) 100 MG capsule Take 100-300 mg by mouth 3 (three) times daily as needed (for nerve pain).  Yes [provider]  HYDROCORTISONE ACE, RECTAL, 30 MG SUPP Place 1 suppository rectally at bedtime.  06/08/15  Yes [provider]  loratadine (CLARITIN) 10 MG tablet Take 10 mg by mouth daily.   Yes [provider]  losartan (COZAAR) 25 MG tablet Take 25 mg by mouth at bedtime.   Yes [provider]  nystatin (MYCOSTATIN/NYSTOP) powder Apply 1 application topically 3 (three) times daily.  05/08/18  Yes [provider]  oxyCODONE-acetaminophen (PERCOCET/ROXICET) 5-325 MG  tablet Take 1-2 tablets by mouth every 6 (six) hours as needed for moderate pain or severe pain. 12/31/18  Yes Shah, Pratik D, DO  pantoprazole (PROTONIX) 20 MG tablet Take 20 mg by mouth daily.   Yes [provider]  predniSONE (DELTASONE) 5 MG tablet Take 7.5-8 mg by mouth daily. As directed by your physician 05/05/19  Yes [provider]  VITAMIN D PO Take 400 Units by mouth daily.    Yes [provider]    Inpatient Medications: Scheduled Meds: . apixaban  2.5 mg Oral BID  . [START ON 05/21/2019] atenolol  50 mg Oral Daily  . balsalazide  2,250 mg Oral TID  . docusate sodium  100 mg Oral BID  . fluticasone  1 spray Each Nare BID  . HYDROCORTISONE ACE (RECTAL)  1 suppository Rectal QHS  . hydrocortisone sod succinate (SOLU-CORTEF) inj  50 mg Intravenous Q6H  . [START ON 05/21/2019] loratadine  10 mg Oral Daily  . [START ON 05/21/2019] pantoprazole  20 mg Oral Daily  . sodium chloride flush  3 mL Intravenous Q12H   Continuous Infusions: . [START ON 05/21/2019] ceFEPime (MAXIPIME) IV    . magnesium sulfate bolus IVPB    . potassium chloride 10 mEq (05/20/19 1740)  . sodium chloride 0.9 % 1,000 mL with potassium chloride 20 mEq infusion    . [START ON 05/21/2019] vancomycin     PRN Meds: acetaminophen **OR** acetaminophen, diazepam, gabapentin, ondansetron **OR** ondansetron (ZOFRAN) IV, oxyCODONE-acetaminophen, polyethylene glycol  Allergies:    Allergies  Allergen Reactions  . Penicillins Anaphylaxis    Has patient had a PCN reaction causing immediate rash, facial/tongue/throat swelling, SOB or lightheadedness with hypotension: Yes Has patient had a PCN reaction causing severe rash involving mucus membranes or skin necrosis: Yes Has patient had a PCN reaction that required hospitalization: No Has patient had a PCN reaction occurring within the last 10 years: No If all of the above answers are "NO", then may proceed with Cephalosporin use.   . Sulfa  Antibiotics Anaphylaxis  . Morphine And Related Nausea And Vomiting  . Voltaren [Diclofenac]     Chest pains - Voltaren Gel     Social History:   Social History   Socioeconomic History  . Marital status: Divorced    Spouse name: Not on file  . Number of children: 0  . Years of education: Not on file  . Highest education level: Not on file  Occupational History  . Occupation: Biochemist, clinical  Social Needs  . Financial resource strain: Not on file  . Food insecurity    Worry: Not on file    Inability: Not on file  . Transportation needs    Medical: Not on file    Non-medical: Not on file  Tobacco Use  . Smoking status: Never Smoker  . Smokeless tobacco: Never Used  Substance and Sexual Activity  . Alcohol use: No    Alcohol/week: 0.0 standard drinks  . Drug use: No  .  Sexual activity: Not on file  Lifestyle  . Physical activity    Days per week: Not on file    Minutes per session: Not on file  . Stress: Not on file  Relationships  . Social Herbalist on phone: Not on file    Gets together: Not on file    Attends religious service: Not on file    Active member of club or organization: Not on file    Attends meetings of clubs or organizations: Not on file    Relationship status: Not on file  . Intimate partner violence    Fear of current or ex partner: Not on file    Emotionally abused: Not on file    Physically abused: Not on file    Forced sexual activity: Not on file  Other Topics Concern  . Not on file  Social History Narrative  . Not on file    Family History:    Family History  Problem Relation Age of Onset  . Heart failure Other   . Diabetes Father   . Heart disease Father   . Hypertension Father      ROS:  Please see the history of present illness.  Constitutional: POSITIVE for chills, fever, night sweats HENT: Negative for ear pain and hearing loss.   Eyes: Negative for loss of vision and eye pain.  Respiratory: POSITIVE for RARE cough,  sputum, wheezing.   Cardiovascular: See HPI. Gastrointestinal: POSITIVE for nausea, abdominal pain, negative for melena and hematochezia.  Genitourinary: Negative for dysuria and hematuria.  Musculoskeletal: Negative for falls and myalgias.  Skin: Negative for itching. Positive for multiple wounds.  Neurological: Negative for focal weakness, focal sensory changes and loss of consciousness.  Endo/Heme/Allergies: Does bruise/bleed easily.  All other ROS reviewed and negative.     Physical Exam/Data:   Vitals:   05/20/19 1545 05/20/19 1615 05/20/19 1645 05/20/19 1739  BP: (!) 108/53 104/78 (!) 104/55 116/71  Pulse:  (!) 133 (!) 128 (!) 132  Resp: (!) 34 (!) 28 (!) 24 (!) 29  Temp:      TempSrc:      SpO2:  92% 97% 98%  Weight:      Height:       No intake or output data in the 24 hours ending 05/20/19 1807 Last 3 Weights 05/20/2019 05/14/2019 05/13/2019  Weight (lbs) 142 lb 13.7 oz 142 lb 13.7 oz 144 lb 2.9 oz  Weight (kg) 64.8 kg 64.8 kg 65.4 kg     Body mass index is 27.9 kg/m.  General:  Frail appearing, in no acute distress HEENT: normal Lymph: no adenopathy Neck: no JVD Endocrine:  No thryomegaly Vascular: radial and DP pulses 2+ bilaterally Cardiac:  tachycardic S1, S2; no murmur appreciated Lungs:  clear to auscultation bilaterally, no wheezing, rhonchi or rales appreciated Abd: soft, nontender, no hepatomegaly  Ext: no edema, actually has wrinkled skin suggesting she may be dry Musculoskeletal:  No deformities noted, moves all limbs independently Skin: warm and dry. Multiple skin wounds including L anterior shin, left medial knee, R posterior leg, gluteal wound. Diffuse erythema in lower shins bilaterally Neuro:  CNs 2-12 intact, no focal abnormalities noted Psych:  Normal affect   EKG:  The EKG was personally reviewed and demonstrates:  Concerning for ST elevation in I and aVL, with reciprocal depression in III and mild depression in V5-V6, changed from prior   Telemetry:  Telemetry was personally reviewed and demonstrates:  Sinus tachycardia  Relevant CV Studies: Echo 01/29/2019 1. The left ventricle has normal systolic function, with an ejection fraction of 60-65%. The cavity size was normal. Left ventricular diastolic Doppler parameters are consistent with indeterminate diastolic dysfunction. No evidence of left ventricular  regional wall motion abnormalities. 2. The right ventricle has normal systolc function. The cavity was normal. There is no increase in right ventricular wall thickness. Right ventricular systolic pressure is mildly to moderately elevated with an estimated pressure of 41.7 mmHg. 3. The mitral valve is grossly normal. There is mild mitral regurgitation. 4. The tricuspid valve was grossly normal. There is mild tricuspid regurgitation. 5. The aortic valve is tricuspid. 6. The aortic root is normal in size and structure.  Laboratory Data:  High Sensitivity Troponin:   Recent Labs  Lab 05/09/19 0615 05/20/19 1540  TROPONINIHS 20* 29*     Chemistry Recent Labs  Lab 05/14/19 0703 05/20/19 1406  NA 140 139  K 3.8 2.8*  CL 100 94*  CO2 29 19*  GLUCOSE 149* 131*  BUN 11 12  CREATININE 0.50 0.93  CALCIUM 8.3* 8.5*  GFRNONAA >60 >60  GFRAA >60 >60  ANIONGAP 11 26*    Recent Labs  Lab 05/20/19 1406  PROT 6.3*  ALBUMIN 2.7*  AST 25  ALT 24  ALKPHOS 160*  BILITOT 1.4*   Hematology Recent Labs  Lab 05/20/19 1406  WBC 18.2*  RBC 4.46  HGB 13.4  HCT 42.1  MCV 94.4  MCH 30.0  MCHC 31.8  RDW 16.0*  PLT 413*   BNPNo results for input(s): BNP, PROBNP in the last 168 hours.  DDimer No results for input(s): DDIMER in the last 168 hours.   Radiology/Studies:  Dg Chest Port 1 View  Result Date: 05/20/2019 CLINICAL DATA:  Fever EXAM: PORTABLE CHEST 1 VIEW COMPARISON:  May 08, 2019 FINDINGS: There remains airspace consolidation throughout the left mid and lower lung zones with left pleural effusion.  The right lung is now essentially clear. Heart is mildly enlarged with pulmonary vascularity normal. No adenopathy. 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 No bone lesions. IMPRESSION: Extensive airspace consolidation throughout the left lower lobe with left pleural effusion. Right lung is now clear. Stable cardiac prominence. Electronically Signed   By: Lowella Grip III M.D.   On: 05/20/2019 15:13    Assessment and Plan:   Abnormal ECG: on paper, very concerning for STEMI. However, on evaluation, she is comfortable and chest pain free. Troponin slightly elevated and delta only 9 -clinical presentation not consistent with STEMI -instructed her that if she has chest pain, she needs to alert the team -will order limited echo for wall motion/EF (though with sepsis, may have stress cardiomyopathy. However, normal echo would be reassuring)  Tachycardia: she actually still has her Zio monitor in place. Sinus tachycardia currently. Prior EF normal, ok for fluid resuscitation as needed.  History of atrial fibrillation: current sinus rhythm  Hypotension: likely 2/2 sepsis. Has history of hypertension.  Management per primary team: Sepsis, with recent admission for pneumonia and cellulitis  For questions or updates, please contact [facility] Please consult [email or web address] for contact info under   Signed, Buford Dresser, MD  05/20/2019 6:07 PM
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
## 2019-05-21 ENCOUNTER — Other Ambulatory Visit: Payer: Self-pay

## 2019-05-21 ENCOUNTER — Inpatient Hospital Stay (HOSPITAL_COMMUNITY): Payer: BC Managed Care – PPO

## 2019-05-21 ENCOUNTER — Encounter (HOSPITAL_COMMUNITY): Payer: Self-pay

## 2019-05-21 DIAGNOSIS — I248 Other forms of acute ischemic heart disease: Secondary | ICD-10-CM

## 2019-05-21 DIAGNOSIS — I10 Essential (primary) hypertension: Secondary | ICD-10-CM

## 2019-05-21 DIAGNOSIS — I480 Paroxysmal atrial fibrillation: Secondary | ICD-10-CM

## 2019-05-21 DIAGNOSIS — R7989 Other specified abnormal findings of blood chemistry: Secondary | ICD-10-CM

## 2019-05-21 DIAGNOSIS — R7881 Bacteremia: Secondary | ICD-10-CM

## 2019-05-21 DIAGNOSIS — L03119 Cellulitis of unspecified part of limb: Secondary | ICD-10-CM

## 2019-05-21 DIAGNOSIS — E2740 Unspecified adrenocortical insufficiency: Secondary | ICD-10-CM

## 2019-05-21 LAB — CBC
HCT: 34.7 % — ABNORMAL LOW (ref 36.0–46.0)
Hemoglobin: 10.9 g/dL — ABNORMAL LOW (ref 12.0–15.0)
MCH: 29.5 pg (ref 26.0–34.0)
MCHC: 31.4 g/dL (ref 30.0–36.0)
MCV: 94 fL (ref 80.0–100.0)
Platelets: 384 10*3/uL (ref 150–400)
RBC: 3.69 MIL/uL — ABNORMAL LOW (ref 3.87–5.11)
RDW: 16.3 % — ABNORMAL HIGH (ref 11.5–15.5)
WBC: 18.6 10*3/uL — ABNORMAL HIGH (ref 4.0–10.5)
nRBC: 0 % (ref 0.0–0.2)

## 2019-05-21 LAB — STREP PNEUMONIAE URINARY ANTIGEN: Strep Pneumo Urinary Antigen: NEGATIVE

## 2019-05-21 LAB — ECHOCARDIOGRAM LIMITED
Height: 60 in
Weight: 2285.73 oz

## 2019-05-21 LAB — BASIC METABOLIC PANEL WITH GFR
Anion gap: 16 — ABNORMAL HIGH (ref 5–15)
BUN: 6 mg/dL — ABNORMAL LOW (ref 8–23)
CO2: 20 mmol/L — ABNORMAL LOW (ref 22–32)
Calcium: 7.8 mg/dL — ABNORMAL LOW (ref 8.9–10.3)
Chloride: 107 mmol/L (ref 98–111)
Creatinine, Ser: 0.73 mg/dL (ref 0.44–1.00)
GFR calc Af Amer: >60 mL/min
GFR calc non Af Amer: >60 mL/min
Glucose, Bld: 137 mg/dL — ABNORMAL HIGH (ref 70–99)
Potassium: 3.1 mmol/L — ABNORMAL LOW (ref 3.5–5.1)
Sodium: 143 mmol/L (ref 135–145)

## 2019-05-21 LAB — LACTIC ACID, PLASMA: Lactic Acid, Venous: 1 mmol/L (ref 0.5–1.9)

## 2019-05-21 MED ORDER — ATENOLOL 50 MG PO TABS
25.0000 mg | ORAL_TABLET | Freq: Three times a day (TID) | ORAL | Status: DC
  Administered 2019-05-21 – 2019-05-24 (×10): 25 mg via ORAL
  Filled 2019-05-21 (×10): qty 1

## 2019-05-21 MED ORDER — HYDROCORTISONE NA SUCCINATE PF 100 MG IJ SOLR
25.0000 mg | Freq: Four times a day (QID) | INTRAMUSCULAR | Status: DC
  Administered 2019-05-21 – 2019-05-22 (×3): 25 mg via INTRAVENOUS
  Filled 2019-05-21 (×3): qty 2

## 2019-05-21 MED ORDER — POTASSIUM CHLORIDE CRYS ER 20 MEQ PO TBCR
40.0000 meq | EXTENDED_RELEASE_TABLET | ORAL | Status: AC
  Administered 2019-05-21: 09:00:00 40 meq via ORAL
  Filled 2019-05-21 (×2): qty 2

## 2019-05-21 MED ORDER — SODIUM CHLORIDE 0.9 % IV SOLN
2.0000 g | Freq: Three times a day (TID) | INTRAVENOUS | Status: DC
  Administered 2019-05-21 – 2019-05-24 (×9): 2 g via INTRAVENOUS
  Filled 2019-05-21 (×9): qty 2

## 2019-05-21 MED ORDER — POTASSIUM CHLORIDE CRYS ER 20 MEQ PO TBCR
40.0000 meq | EXTENDED_RELEASE_TABLET | Freq: Once | ORAL | Status: AC
  Administered 2019-05-21: 40 meq via ORAL
  Filled 2019-05-21: qty 2

## 2019-05-21 MED ORDER — COLLAGENASE 250 UNIT/GM EX OINT
TOPICAL_OINTMENT | Freq: Every day | CUTANEOUS | Status: DC
  Administered 2019-05-21 – 2019-05-24 (×4): via TOPICAL
  Filled 2019-05-21: qty 30

## 2019-05-21 NOTE — Progress Notes (Addendum)
   05/21/19 0808  Vitals  Temp 97.8 F (36.6 C)  Temp Source Oral  BP 136/84  MAP (mmHg) 95  Pulse Rate (!) 134  ECG Heart Rate (!) 133  Resp (!) 29  Oxygen Therapy  SpO2 97 %  MEWS Score  MEWS RR 2  MEWS Pulse 3  MEWS Systolic 0  MEWS LOC 0  MEWS Temp 0  MEWS Score 5  MEWS Score Color Red  MEWS Assessment  Is this an acute change? No  Provider Notification  Provider Name/Title Aline August  Date Provider Notified 05/21/19  Time Provider Notified [phone]  Notification Type Face-to-face  Notification Reason Other (Comment) (just to notify)  Response Other (Comment) (administer scheduled atenolol )   Red MEWS score trigger; protocol initiated. Will continue to monitor.  Hiram Comber, RN

## 2019-05-21 NOTE — Consult Note (Signed)
[facility] Nurse wound consult note Reason for Consult: pressure ulcers Wound type: vascular skin changes; history of venous stasis 1. Left medial calf : dark purple irregular shaped non bullous injury 2. Left medial malleolus; dark purple; non bullous injury 3. Left pretibial; 100% eschar, most likely evolved from similar presentation of the other two sites described below 4. Left distal medial thigh; full thickness skin loss 5.  Right posterior calf; dry skin, healed ulceration  Pressure Injury POA: NA Measurement: 1. 3cm x 2.5cm x 0cm; 100% dark purple 2. 0.5cm x 0.5cm x 0cm; 100% dark purple 3. 2cm x 1.5cm x 0.1cm; 100% eschar  4. 1cm x 1cm x 0.1cm; 100% black/yellow  5. 6cm x 5cm x 0cm; scaling skin healed pink  Wound bed: see above  Drainage (amount, consistency, odor) minimal  Periwound: inact, 1+ pitting edema bilateral; distal palpable pulses but weak Dressing procedure/placement/frequency: Enzymatic debridement ointment to the left pretibial and left inner thigh wounds daily, top with dry dressing.   Silicone foam to the posterior right thigh; left medial malleolor; and left medial calf wound, change every 3 days.   Patient is followed by Dr. Sharol Given, reports she sees him on a regular basis. Using compression sock (Vive) that Dr. Sharol Given prescribed. She will follow up with him as outpatient to monitor the status of the LLE wounds.    Discussed POC with patient and bedside nurse.  Re consult if needed, will not follow at this time. Thanks  Nicholad Kautzman [employer], RN,CWOCN, CNS, [place] [phone])

## 2019-05-21 NOTE — Progress Notes (Signed)
Patient arrived to the unit via ED nurse. Alert/oriend x4. O2 Sat 98% on 2L [place]. No s/s distress although HR 120-130's & RR 20's fire RED MEWS protocol initiated in ED. Skin assessment completed with Charge RN Ronalee Belts. Significant sacral chaffing and moisture associated contact dermatitis noted in groin as well. Also multiple areas of significant bruising on all extremities. Right LE wound with cellulitis.  Patient placed on PC monitor and continued on MEWS RED protocol.

## 2019-05-21 NOTE — Evaluation (Addendum)
Clinical/Bedside Swallow Evaluation Patient Details  Name: Betty Bryan MRN: [id number] Date of Birth: May 08, 1956  Today's Date: 05/21/2019 Time: SLP Start Time (ACUTE ONLY): 20 SLP Stop Time (ACUTE ONLY): [id number] SLP Time Calculation (min) (ACUTE ONLY): 17 min  Past Medical History:  Past Medical History:  Diagnosis Date  . Adrenal insufficiency ([place])   . HTN (hypertension)   . L4 vertebral fracture ([facility])    L4-L5 fracture  . Palpitations   . Paroxysmal A-fib ([place])   . PVC's (premature ventricular contractions)   . Ulcerative colitis    Past Surgical History:  Past Surgical History:  Procedure Laterality Date  . HAND SURGERY    . NASAL SINUS SURGERY     HPI:  Pt is a 63 yo female admitted with sepsis from PNA vs cellulitis; recent admission to [facility] earlier this month for the same. BSE om April 2020 recommended Dys 1 diet and nectar thick liquids (advanced to thin liquids before d/c) secondary to AMS, decreased respiratory status. PMH includes: adrenal insufficiency, afib, HTN, colitis on chronic prednisone   Assessment / Plan / Recommendation Clinical Impression  Pt has no overt s/s of aspiration with mildly prolonged mastication that she attributes to taste over function. She claims to be a "picky" eater, which does limit the amount of solids she would eat for this evaluation. She has occasional belching, subjectively describes solids (such as big pills) that feel "stuck", and she describes "dry heaving" at home PTA. Question baseline esophageal issues. Per chart she appears to have had multiple cases of PNA this year, but she insists that she has never been confirmed (PNA vs cellulitis). Recommend continuing with regular solids and thin liquids, trying meds whole or crushed in puree per her preference. Would focus on aspiration/esophageal precautions as well as increasing oral hygiene. Given possible recurrent PNA, will f/u for tolerance and to determine if additional instrumental  testing is warranted.  SLP Visit Diagnosis: Dysphagia, unspecified (R13.10)    Aspiration Risk  Mild aspiration risk    Diet Recommendation Regular;Thin liquid   Liquid Administration via: Cup;Straw Medication Administration: Whole meds with liquid Supervision: Patient able to self feed;Intermittent supervision to cue for compensatory strategies Compensations: Slow rate;Small sips/bites Postural Changes: Seated upright at 90 degrees;Remain upright for at least 30 minutes after po intake    Other  Recommendations Oral Care Recommendations: Oral care BID   Follow up Recommendations (tba)      Frequency and Duration min 2x/week  1 week       Prognosis Prognosis for Safe Diet Advancement: Good      Swallow Study   General HPI: Pt is a 63 yo female admitted with sepsis from PNA vs cellulitis; recent admission to [facility] earlier this month for the same. BSE om April 2020 recommended Dys 1 diet and nectar thick liquids (advanced to thin liquids before d/c) secondary to AMS, decreased respiratory status. PMH includes: adrenal insufficiency, afib, HTN, colitis on chronic prednisone Type of Study: Bedside Swallow Evaluation Previous Swallow Assessment: see HPI Diet Prior to this Study: Regular;Thin liquids Temperature Spikes Noted: Yes(101.9) Respiratory Status: Nasal cannula History of Recent Intubation: No Behavior/Cognition: Alert;Cooperative;Pleasant mood Oral Cavity Assessment: Within Functional Limits Oral Care Completed by SLP: No Oral Cavity - Dentition: Adequate natural dentition Vision: Functional for self-feeding Self-Feeding Abilities: Able to feed self Patient Positioning: Upright in bed Baseline Vocal Quality: Normal Volitional Swallow: Able to elicit    Oral/Motor/Sensory Function Overall Oral Motor/Sensory Function: Within functional limits   Ice  Chips Ice chips: Not tested   Thin Liquid Thin Liquid: Within functional limits Presentation: Self Fed;Straw    Nectar  Thick Nectar Thick Liquid: Not tested   Honey Thick Honey Thick Liquid: Not tested   Puree Puree: Not tested(pt refused)   Solid     Solid: Within functional limits Presentation: [place] 05/21/2019,9:43 AM  [name], M.A. [place] Acute Environmental education officer [phone] Office [phone]

## 2019-05-21 NOTE — Progress Notes (Signed)
  Echocardiogram 2D Echocardiogram has been performed.  Betty Bryan 05/21/2019, 4:01 PM

## 2019-05-21 NOTE — Progress Notes (Signed)
Patient ID: Betty Bryan, female   DOB: 1956-01-25, 63 y.o.   MRN: [id number]  PROGRESS NOTE    Betty Bryan  [name] DOB: 03/05/1956 DOA: 05/20/2019 PCP: Betty Infante, MD   Brief Narrative:  63 year old female with history of adrenal insufficiency on chronic prednisone, A. fib on Eliquis, hypertension, colitis, recent admission to [facility] from 05/08/2019-05/14/2019 for sepsis secondary to pneumonia and left lower extremity cellulitis treated with vancomycin and aztreonam and transitioned to doxycycline presented on 05/20/2019 with fever/cough and generalized weakness.  She was admitted for sepsis secondary to pneumonia/cellulitis.  Cardiology was consulted for abnormal EKG and positive troponins.  Assessment & Plan:  Sepsis: Present on admission, from pneumonia/cellulitis -Follow cultures.  Antibiotics plan as below. -Hemodynamically improving.  Heart rate improving.  Blood pressures currently stable. -Currently on IV fluid.  DC IV fluids if tolerates oral food.  Community-acquired pneumonia with concerns for gram-negative pneumonia versus MRSA pneumonia -Patient was recently treated at [facility] for pneumonia with broad-spectrum antibiotics and discharged on oral doxycycline -Chest x-ray on admission shows extensive airspace consolidation throughout the left lower lobe with pleural effusion -Continue broad-spectrum antibiotics for now.  Follow cultures.  Urine Legionella and streptococcal antigen -COVID-19 testing negative.  Procalcitonin was 0.2  Leukocytosis -Probably from above and steroid use.  Monitor  Cellulitis around pressure ulcers -Wound care consult. Continue antibiotics  Hypertension - BP improving. Continue Atenolol but change to 25mg  every 8 hours which is her home dose as per the patient  Paroxysmal A. fib--continue atenolol and apixaban.  Abnormal EKG  -Cardiology following.  Troponins trending down.  No chest pain.  Follow 2D echo.  Chronic adrenal insufficiency   -Continue cilostazol.  Decrease Solu-Cortef to 25 mg every 6 hours.  Switch to home dose of oral prednisone in the next 1 to 2 days  Hypokalemia -Replace.  Repeat a.m. labs  Generalized deconditioning -PT eval.  DVT prophylaxis: Eliquis Code Status: DNR Family Communication: None at bedside Disposition Plan: Home in 2 to 3 days if clinically improves.  Consultants: Cardiology  Procedures: None  Antimicrobials:  Cefepime and vancomycin from 05/20/2019 onwards  Subjective: Patient seen and examined at bedside.  She feels slightly better.  Still feels weak.  Has intermittent cough.  No current chest pain.  No overnight fever or vomiting.  Objective: Vitals:   05/21/19 0908 05/21/19 0938 05/21/19 0953 05/21/19 1023  BP: (!) 118/59 135/80 122/82 119/75  Pulse: (!) 123 (!) 116 (!) 108 96  Resp: (!) 23 19 20 17   Temp:      TempSrc:      SpO2: 100% 99% 98% 99%  Weight:      Height:        Intake/Output Summary (Last 24 hours) at 05/21/2019 1119 Last data filed at 05/21/2019 0600 Gross per 24 hour  Intake 881.67 ml  Output -  Net 881.67 ml   Filed Weights   05/20/19 1346  Weight: 64.8 kg    Examination:  General exam: Appears calm and comfortable.  Looks older than stated age. Respiratory system: Bilateral decreased breath sounds at bases with some scattered crackles Cardiovascular system: S1 & S2 heard, intermittent tachycardia gastrointestinal system: Abdomen is nondistended, soft and nontender. Normal bowel sounds heard. Extremities: No cyanosis, clubbing, edema  Central nervous system: Alert and oriented. No focal neurological deficits. Moving extremities Skin: Multiple pressure ulcers on left lower extremity Psychiatry: Judgement and insight appear normal. Mood & affect appropriate.     Data Reviewed: I have personally reviewed  following labs and imaging studies  CBC: Recent Labs  Lab 05/20/19 1406 05/21/19 0620  WBC 18.2* 18.6*  NEUTROABS 15.1*  --    HGB 13.4 10.9*  HCT 42.1 34.7*  MCV 94.4 94.0  PLT 413* 0000000   Basic Metabolic Panel: Recent Labs  Lab 05/20/19 1406 05/20/19 1540 05/21/19 0620  NA 139  --  143  K 2.8*  --  3.1*  CL 94*  --  107  CO2 19*  --  20*  GLUCOSE 131*  --  137*  BUN 12  --  6*  CREATININE 0.93  --  0.73  CALCIUM 8.5*  --  7.8*  MG  --  1.6*  --    GFR: Estimated Creatinine Clearance: 61.2 mL/min (by C-G formula based on SCr of 0.73 mg/dL). Liver Function Tests: Recent Labs  Lab 05/20/19 1406  AST 25  ALT 24  ALKPHOS 160*  BILITOT 1.4*  PROT 6.3*  ALBUMIN 2.7*   No results for input(s): LIPASE, AMYLASE in the last 168 hours. No results for input(s): AMMONIA in the last 168 hours. Coagulation Profile: No results for input(s): INR, PROTIME in the last 168 hours. Cardiac Enzymes: No results for input(s): CKTOTAL, CKMB, CKMBINDEX, TROPONINI in the last 168 hours. BNP (last 3 results) No results for input(s): PROBNP in the last 8760 hours. HbA1C: No results for input(s): HGBA1C in the last 72 hours. CBG: No results for input(s): GLUCAP in the last 168 hours. Lipid Profile: No results for input(s): CHOL, HDL, LDLCALC, TRIG, CHOLHDL, LDLDIRECT in the last 72 hours. Thyroid Function Tests: No results for input(s): TSH, T4TOTAL, FREET4, T3FREE, THYROIDAB in the last 72 hours. Anemia Panel: No results for input(s): VITAMINB12, FOLATE, FERRITIN, TIBC, IRON, RETICCTPCT in the last 72 hours. Sepsis Labs: Recent Labs  Lab 05/20/19 1406 05/20/19 1530 05/20/19 1948 05/21/19 0620  PROCALCITON  --   --  0.20  --   LATICACIDVEN 3.7* 2.0* 1.9 1.0    Recent Results (from the past 240 hour(s))  SARS Coronavirus 2 [facility] order, Performed in [facility] hospital lab) Nasopharyngeal Nasopharyngeal Swab     Status: None   Collection Time: 05/20/19  2:22 PM   Specimen: Nasopharyngeal Swab  Result Value Ref Range Status   SARS Coronavirus 2 NEGATIVE NEGATIVE Final    Comment: (NOTE) If result is  NEGATIVE SARS-CoV-2 target nucleic acids are NOT DETECTED. The SARS-CoV-2 RNA is generally detectable in upper and lower  respiratory specimens during the acute phase of infection. The lowest  concentration of SARS-CoV-2 viral copies this assay can detect is 250  copies / mL. A negative result does not preclude SARS-CoV-2 infection  and should not be used as the sole basis for treatment or other  patient management decisions.  A negative result may occur with  improper specimen collection / handling, submission of specimen other  than nasopharyngeal swab, presence of viral mutation(s) within the  areas targeted by this assay, and inadequate number of viral copies  (<250 copies / mL). A negative result must be combined with clinical  observations, patient history, and epidemiological information. If result is POSITIVE SARS-CoV-2 target nucleic acids are DETECTED. The SARS-CoV-2 RNA is generally detectable in upper and lower  respiratory specimens dur ing the acute phase of infection.  Positive  results are indicative of active infection with SARS-CoV-2.  Clinical  correlation with patient history and other diagnostic information is  necessary to determine patient infection status.  Positive results do  not rule  out bacterial infection or co-infection with other viruses. If result is PRESUMPTIVE POSTIVE SARS-CoV-2 nucleic acids MAY BE PRESENT.   A presumptive positive result was obtained on the submitted specimen  and confirmed on repeat testing.  While 2019 novel coronavirus  (SARS-CoV-2) nucleic acids may be present in the submitted sample  additional confirmatory testing may be necessary for epidemiological  and / or clinical management purposes  to differentiate between  SARS-CoV-2 and other Sarbecovirus currently known to infect humans.  If clinically indicated additional testing with an alternate test  methodology [phone]) is advised. The SARS-CoV-2 RNA is generally  detectable  in upper and lower respiratory sp ecimens during the acute  phase of infection. The expected result is Negative. Fact Sheet for Patients:  StrictlyIdeas.no Fact Sheet for Healthcare Providers: BankingDealers.co.za This test is not yet approved or cleared by the [place] FDA and has been authorized for detection and/or diagnosis of SARS-CoV-2 by FDA under an Emergency Use Authorization (EUA).  This EUA will remain in effect (meaning this test can be used) for the duration of the COVID-19 declaration under Section 564(b)(1) of the Act, 21 U.S.C. section 360bbb-3(b)(1), unless the authorization is terminated or revoked sooner. Performed at [facility], [place] [address] [place], [place] [address]          Radiology Studies: Dg Chest Port 1 View  Result Date: 05/20/2019 CLINICAL DATA:  Fever EXAM: PORTABLE CHEST 1 VIEW COMPARISON:  May 08, 2019 FINDINGS: There remains airspace consolidation throughout the left mid and lower lung zones with left pleural effusion. The right lung is now essentially clear. Heart is mildly enlarged with pulmonary vascularity normal. No adenopathy. 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 No bone lesions. IMPRESSION: Extensive airspace consolidation throughout the left lower lobe with left pleural effusion. Right lung is now clear. Stable cardiac prominence. Electronically Signed   By: Lowella Grip III M.D.   On: 05/20/2019 15:13        Scheduled Meds: . apixaban  2.5 mg Oral BID  . atenolol  25 mg Oral Q8H  . balsalazide  2,250 mg Oral TID  . collagenase   Topical Daily  . docusate sodium  100 mg Oral BID  . fluticasone  1 spray Each Nare BID  . hydrocortisone  25 mg Rectal QHS  . hydrocortisone sod succinate (SOLU-CORTEF) inj  50 mg Intravenous Q6H  . loratadine  10 mg Oral Daily  . pantoprazole  20 mg Oral Daily  . potassium chloride  40 mEq Oral Q4H  . sodium chloride flush  3 mL  Intravenous Q12H   Continuous Infusions: . 0.9 % NaCl with KCl 20 mEq / L 100 mL/hr at 05/21/19 0923  . ceFEPime (MAXIPIME) IV    . vancomycin       LOS: 1 day        Aline August, MD [facility] 05/21/2019, 11:19 AM

## 2019-05-21 NOTE — Progress Notes (Signed)
Progress Note  Patient Name: Betty Bryan Date of Encounter: 05/21/2019  Primary Cardiologist: Buford Dresser, MD   Subjective   Feeling much "perkier" this AM. No chest pain. Slept well. Reviewed labs thus far and plan for limited echo today. She did have significant chest wall pain with echo in the past (though she thinks she had a fungal skin infection at the time) and required pain meds. She thinks she will do fine with it today. Breathing is stable.   Inpatient Medications    Scheduled Meds:  apixaban  2.5 mg Oral BID   atenolol  50 mg Oral Daily   balsalazide  2,250 mg Oral TID   docusate sodium  100 mg Oral BID   fluticasone  1 spray Each Nare BID   hydrocortisone  25 mg Rectal QHS   hydrocortisone sod succinate (SOLU-CORTEF) inj  50 mg Intravenous Q6H   loratadine  10 mg Oral Daily   pantoprazole  20 mg Oral Daily   potassium chloride  40 mEq Oral Q4H   sodium chloride flush  3 mL Intravenous Q12H   Continuous Infusions:  0.9 % NaCl with KCl 20 mEq / L 100 mL/hr at 05/20/19 2311   ceFEPime (MAXIPIME) IV 2 g (05/21/19 0137)   vancomycin     PRN Meds: acetaminophen **OR** acetaminophen, diazepam, gabapentin, ondansetron **OR** ondansetron (ZOFRAN) IV, oxyCODONE-acetaminophen, polyethylene glycol   Vital Signs    Vitals:   05/21/19 0443 05/21/19 0548 05/21/19 0745 05/21/19 0808  BP:  112/75 129/80 136/84  Pulse:  (!) 124 (!) 133 (!) 134  Resp:  (!) 25 (!) 27 (!) 29  Temp: 98.5 F (36.9 C)  97.8 F (36.6 C) 97.8 F (36.6 C)  TempSrc: Axillary  Oral Oral  SpO2:  98% 95% 97%  Weight:      Height:        Intake/Output Summary (Last 24 hours) at 05/21/2019 0819 Last data filed at 05/21/2019 0600 Gross per 24 hour  Intake 881.67 ml  Output --  Net 881.67 ml   Last 3 Weights 05/20/2019 05/14/2019 05/13/2019  Weight (lbs) 142 lb 13.7 oz 142 lb 13.7 oz 144 lb 2.9 oz  Weight (kg) 64.8 kg 64.8 kg 65.4 kg      Telemetry    Sinus  tachycardia to 140 bpm - Personally Reviewed  ECG    Sinus tach at 135 bpm with small ST elevation in I/aVL and slight depressions in V5-V6 - Personally Reviewed  Physical Exam   GEN: No acute distress.  [place] in place. More color in her face today Neck: No JVD Cardiac: tachycardic S1 and S2, no murmurs, rubs, or gallops.  Respiratory: Clear to auscultation bilaterally. GI: Soft, nontender, non-distended  MS: No edema; Unchanged skin lesions from yesterday Neuro:  Nonfocal  Psych: Normal affect   Labs    High Sensitivity Troponin:   Recent Labs  Lab 05/09/19 0615 05/20/19 1540 05/20/19 1948  TROPONINIHS 20* 29* 75*      Chemistry Recent Labs  Lab 05/20/19 1406 05/21/19 0620  NA 139 143  K 2.8* 3.1*  CL 94* 107  CO2 19* 20*  GLUCOSE 131* 137*  BUN 12 6*  CREATININE 0.93 0.73  CALCIUM 8.5* 7.8*  PROT 6.3*  --   ALBUMIN 2.7*  --   AST 25  --   ALT 24  --   ALKPHOS 160*  --   BILITOT 1.4*  --   GFRNONAA >60 >60  GFRAA >60 >60  ANIONGAP 26* 16*     Hematology Recent Labs  Lab 05/20/19 1406 05/21/19 0620  WBC 18.2* 18.6*  RBC 4.46 3.69*  HGB 13.4 10.9*  HCT 42.1 34.7*  MCV 94.4 94.0  MCH 30.0 29.5  MCHC 31.8 31.4  RDW 16.0* 16.3*  PLT 413* 384    BNPNo results for input(s): BNP, PROBNP in the last 168 hours.   DDimer No results for input(s): DDIMER in the last 168 hours.   Radiology    Dg Chest Port 1 View  Result Date: 05/20/2019 CLINICAL DATA:  Fever EXAM: PORTABLE CHEST 1 VIEW COMPARISON:  May 08, 2019 FINDINGS: There remains airspace consolidation throughout the left mid and lower lung zones with left pleural effusion. The right lung is now essentially clear. Heart is mildly enlarged with pulmonary vascularity normal. No adenopathy. 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 No bone lesions. IMPRESSION: Extensive airspace consolidation throughout the left lower lobe with left pleural effusion. Right lung is now clear. Stable cardiac prominence.  Electronically Signed   By: Lowella Grip III M.D.   On: 05/20/2019 15:13    Cardiac Studies   Echo 01/29/2019 1. The left ventricle has normal systolic function, with an ejection fraction of 60-65%. The cavity size was normal. Left ventricular diastolic Doppler parameters are consistent with indeterminate diastolic dysfunction. No evidence of left ventricular  regional wall motion abnormalities. 2. The right ventricle has normal systolc function. The cavity was normal. There is no increase in right ventricular wall thickness. Right ventricular systolic pressure is mildly to moderately elevated with an estimated pressure of 41.7 mmHg. 3. The mitral valve is grossly normal. There is mild mitral regurgitation. 4. The tricuspid valve was grossly normal. There is mild tricuspid regurgitation. 5. The aortic valve is tricuspid. 6. The aortic root is normal in size and structure.  Patient Profile     63 y.o. female with a hx of paroxysmal atrial fib, adrenal insufficiency, hypertension, history of DVT, steroid-dependent IBD who is being followed for the evaluation of abnormal ECG at the request of Dr. Lorin Mercy  Assessment & Plan    Abnormal ECG: on paper, very concerning for STEMI. However, she remains comfortable, had had zero chest pain. hsTn trend 20 -29-75. Given sepsis and sinus tachycardia up to 140 bpm, this is consistent with demand ischemia and not ACS -clinical presentation not consistent with STEMI -instructed her that if she has chest pain, she needs to alert the team -ordered limited echo for wall motion/EF (though with sepsis, may have stress cardiomyopathy. However, normal echo would be reassuring)  Tachycardia: has mild baseline sinus tachycardia, and with her sepsis has been up to 140 bpm. No afib appreciated this admission. Prior EF normal, ok for fluid resuscitation as needed. -was on atenolol 50 mg daily at home. Ordered for 25 mg q8 today. Ok to monitor this dose, watch  blood pressure closely.  History of atrial fibrillation: current sinus tachycardia. I do not actually have any records of how often she has afib/when her last episode was, which is why she was wearing the Zio patch. -ok to continue apixaban. However, her age and creatinine suggest she needs 5 mg BID and not 2.5 mg BID dosing for afib prophylaxis. Was on 2.5 mg BID for DVT dosing. Our outpatient plan had been to continue DVT dosing until we could determine if she was having paroxysmal afib.  Hypotension: likely 2/2 sepsis. Has history of hypertension. Improved today with sepsis treatment.  Hypokalemia: 3.1 today, receiving repletion  Sepsis: WBC 18, lactate initially 3.7 and now normalized. However, procalcitonin 0.2. Recent hospitalization for pneumonia and cellulitis as likely sources. No positive cultures yet. Covid negative. Feels improved today with antibiotics and fluids.   [facility] will sign off.   Medication Recommendations:  Current medications as ntoed Other recommendations (labs, testing, etc):  She will need to mail in her Zio patch after discharge Follow up as an outpatient:  She has an appt with me on 07/21/19. I will be in touch about her Zio results and echo results prior to that.  For questions or updates, please contact [place] Please consult [email or web address] for contact info under        Signed, Buford Dresser, MD  05/21/2019, 8:19 AM

## 2019-05-22 LAB — CBC WITH DIFFERENTIAL/PLATELET
Abs Immature Granulocytes: 0.28 10*3/uL — ABNORMAL HIGH (ref 0.00–0.07)
Basophils Absolute: 0 10*3/uL (ref 0.0–0.1)
Basophils Relative: 0 %
Eosinophils Absolute: 0 10*3/uL (ref 0.0–0.5)
Eosinophils Relative: 0 %
HCT: 31.3 % — ABNORMAL LOW (ref 36.0–46.0)
Hemoglobin: 9.6 g/dL — ABNORMAL LOW (ref 12.0–15.0)
Immature Granulocytes: 2 %
Lymphocytes Relative: 2 %
Lymphs Abs: 0.3 10*3/uL — ABNORMAL LOW (ref 0.7–4.0)
MCH: 29.4 pg (ref 26.0–34.0)
MCHC: 30.7 g/dL (ref 30.0–36.0)
MCV: 96 fL (ref 80.0–100.0)
Monocytes Absolute: 0.8 10*3/uL (ref 0.1–1.0)
Monocytes Relative: 5 %
Neutro Abs: 13.7 10*3/uL — ABNORMAL HIGH (ref 1.7–7.7)
Neutrophils Relative %: 91 %
Platelets: 378 10*3/uL (ref 150–400)
RBC: 3.26 MIL/uL — ABNORMAL LOW (ref 3.87–5.11)
RDW: 16.6 % — ABNORMAL HIGH (ref 11.5–15.5)
WBC: 15.1 10*3/uL — ABNORMAL HIGH (ref 4.0–10.5)
nRBC: 0 % (ref 0.0–0.2)

## 2019-05-22 LAB — COMPREHENSIVE METABOLIC PANEL
ALT: 19 U/L (ref 0–44)
AST: 14 U/L — ABNORMAL LOW (ref 15–41)
Albumin: 1.9 g/dL — ABNORMAL LOW (ref 3.5–5.0)
Alkaline Phosphatase: 111 U/L (ref 38–126)
Anion gap: 10 (ref 5–15)
BUN: 7 mg/dL — ABNORMAL LOW (ref 8–23)
CO2: 21 mmol/L — ABNORMAL LOW (ref 22–32)
Calcium: 8.1 mg/dL — ABNORMAL LOW (ref 8.9–10.3)
Chloride: 113 mmol/L — ABNORMAL HIGH (ref 98–111)
Creatinine, Ser: 0.58 mg/dL (ref 0.44–1.00)
GFR calc Af Amer: >60 mL/min (ref >60)
GFR calc non Af Amer: >60 mL/min (ref >60)
Glucose, Bld: 100 mg/dL — ABNORMAL HIGH (ref 70–99)
Potassium: 3.7 mmol/L (ref 3.5–5.1)
Sodium: 144 mmol/L (ref 135–145)
Total Bilirubin: 0.8 mg/dL (ref 0.3–1.2)
Total Protein: 4.5 g/dL — ABNORMAL LOW (ref 6.5–8.1)

## 2019-05-22 LAB — MAGNESIUM: Magnesium: 1.9 mg/dL (ref 1.7–2.4)

## 2019-05-22 MED ORDER — HYDROCORTISONE NA SUCCINATE PF 100 MG IJ SOLR
25.0000 mg | Freq: Two times a day (BID) | INTRAMUSCULAR | Status: DC
  Administered 2019-05-22 – 2019-05-24 (×4): 25 mg via INTRAVENOUS
  Filled 2019-05-22 (×4): qty 2

## 2019-05-22 NOTE — Progress Notes (Signed)
OT Cancellation Note  Patient Details Name: CHLORIS ZUKAUSKAS MRN: [id number] DOB: 24-Mar-1956   Cancelled Treatment:    Reason Eval/Treat Not Completed: Other (comment);Patient declined, no reason specified(Pt refusing OT at this time. She feels there is no need.)  Pt stating that "I have it all figured out at home. I have hired 3 people to assist. And I don't want to bothered right now. OT asking pt is she could get into chair position in bed for light grooming and BUE exercise. Pt refused. OT asked if OT could come back later this week and pt responded, "No thank you. I've got it covered." Pt alert and oriented and family member "Truman Hayward" is room for discussion. OT signing off.   Ebony Hail Harold Hedge) Marsa Aris OTR/L Acute Rehabilitation Services Pager: [phone] Office: [place] 05/22/2019, 4:28 PM

## 2019-05-22 NOTE — Evaluation (Signed)
Physical Therapy Evaluation Patient Details Name: Betty Bryan MRN: [id number] DOB: Dec 20, 1955 Today's Date: 05/22/2019   History of Present Illness  63 yo admitted with sepsis with PNA and sacral wound. PMhx: thoracic compression fx, Afib, HTN, colitis  Clinical Impression  Pt supine on arrival on 2L with SpO2 95-97% throughout session with friend Milbert Coulter present during session. Pt reports she sleeps in lift chair and transfers from lift chair to W/C for transport to bathroom. She has arranged 24hr assist at home for all ADLs and homemaking. Pt reports generalized anxiety and has difficulty with attending to task when it is not the exact way she is used to. Pt currently limited by pain, weakness and anxiety with max assist required for pivot to EOB with pt unable to tolerate further mobility. Recommend SNF for 24hr assist as pt currently requires greater than only single person assist to mobilize. Pt however, states she wants to return home and that she can perform mobility in her environment. Pt will benefit from acute therapy to maximize strength, mobility and function. Pt requires increased time and processing for all instruction, room setup and transitions.      Follow Up Recommendations SNF;Supervision/Assistance - 24 hour    Equipment Recommendations  Other (comment)(hoyer lift)    Recommendations for Other Services       Precautions / Restrictions Precautions Precautions: Fall      Mobility  Bed Mobility Overal bed mobility: Needs Assistance Bed Mobility: Supine to Sit;Sit to Supine     Supine to sit: Max assist Sit to supine: Max assist   General bed mobility comments: attempted pivot to EOB to simulate lift chair for pivot to W/C. However, pt required max assist to pivot legs to EOB and elevate trunk from surface with HOB 40 degrees. pt with anxiety with movement and reporting back pain and unable to fully scoot hips to EOB or sit unassisted. Max assist to pivot back onto  surface and total assist x 2 trials to slide toward [facility]  Transfers                 General transfer comment: unable  Ambulation/Gait                Stairs            Wheelchair Mobility    Modified Rankin (Stroke Patients Only)       Balance Overall balance assessment: History of Falls;Needs assistance Sitting-balance support: Bilateral upper extremity supported;Feet unsupported Sitting balance-Leahy Scale: Poor                                       Pertinent Vitals/Pain Faces Pain Scale: Hurts even more Pain Location: back with mobility Pain Descriptors / Indicators: Aching;Guarding Pain Intervention(s): Limited activity within patient's tolerance;Repositioned;Monitored during session    Home Living Family/patient expects to be discharged to:: Private residence   Available Help at Discharge: Available 24 hours/day;Personal care attendant;Friend(s) Type of Home: House Home Access: Ramped entrance     Home Layout: One level Home Equipment: Environmental consultant - 2 wheels;Electric scooter;Wheelchair - manual;Cane - single point;Bedside commode;Shower seat      Prior Function Level of Independence: Needs assistance   Gait / Transfers Assistance Needed: assist from lift chair to W/C to bathroom, does not walk  ADL's / Homemaking Assistance Needed: 24 hr assist for all mobility        Hand  Dominance        Extremity/Trunk Assessment   Upper Extremity Assessment Upper Extremity Assessment: Generalized weakness RUE Deficits / Details: pt with gross weakness with strength grossly 2+/5 with inability to perform full ROM or significantly lift trunk with bil UE    Lower Extremity Assessment Lower Extremity Assessment: RLE deficits/detail;LLE deficits/detail RLE Deficits / Details: pt with grossly 2/5 strength as she was able to assist with sliding legs in bed but had difficulty lifting heels off mattress LLE Deficits / Details: pt with  grossly 2/5 strength as she was able to assist with sliding legs in bed but had difficulty lifting heels off mattress    Cervical / Trunk Assessment Cervical / Trunk Assessment: Kyphotic  Communication      Cognition Arousal/Alertness: Awake/alert Behavior During Therapy: WFL for tasks assessed/performed Overall Cognitive Status: Impaired/Different from baseline Area of Impairment: Attention;Problem solving                   Current Attention Level: Sustained         Problem Solving: Slow processing;Decreased initiation General Comments: pt anxious with all mobility with increased time to process and respond to questions and commands, pt lacks ability to truly assess deficits and need for assist      General Comments      Exercises     Assessment/Plan    PT Assessment Patient needs continued PT services  PT Problem List Decreased strength;Decreased activity tolerance;Decreased balance;Decreased mobility;Decreased range of motion;Decreased coordination;Decreased safety awareness;Decreased cognition;Decreased knowledge of use of DME;Pain;Decreased skin integrity       PT Treatment Interventions Gait training;Functional mobility training;Therapeutic activities;Therapeutic exercise;Wheelchair mobility training;Patient/family education;DME instruction;Cognitive remediation;Balance training    PT Goals (Current goals can be found in the Care Plan section)  Acute Rehab PT Goals Patient Stated Goal: return home PT Goal Formulation: With patient Time For Goal Achievement: 06/04/19 Potential to Achieve Goals: Fair    Frequency Min 3X/week   Barriers to discharge Decreased caregiver support pt reports 24hr caregivers but only 1 person at a time    Co-evaluation               AM-PAC PT "6 Clicks" Mobility  Outcome Measure Help needed turning from your back to your side while in a flat bed without using bedrails?: Total Help needed moving from lying on your back  to sitting on the side of a flat bed without using bedrails?: Total Help needed moving to and from a bed to a chair (including a wheelchair)?: Total Help needed standing up from a chair using your arms (e.g., wheelchair or bedside chair)?: Total Help needed to walk in hospital room?: Total Help needed climbing 3-5 steps with a railing? : Total 6 Click Score: 6    End of Session Equipment Utilized During Treatment: Oxygen Activity Tolerance: Patient limited by pain Patient left: in bed;with call bell/phone within reach;with bed alarm set;with family/visitor present Nurse Communication: Mobility status;Need for lift equipment PT Visit Diagnosis: Other abnormalities of gait and mobility (R26.89);Muscle weakness (generalized) (M62.81);History of falling (Z91.81)    Time: [id number] PT Time Calculation (min) (ACUTE ONLY): 40 min   Charges:   PT Evaluation $PT Eval Moderate Complexity: 1 Mod PT Treatments $Therapeutic Activity: 23-37 mins        Emika Tiano Pam Drown, PT Acute Rehabilitation Services Pager: [phone] Office: [place] 05/22/2019, 12:41 PM

## 2019-05-22 NOTE — Progress Notes (Deleted)
Oxygen saturation level is 74% on room air. No signs/symptoms of respiratory distress. O2 placed @ 2L [place]. O2 sat rate remained less than 90%. O2 increased to 3L [place]. O2 sat rate remained less than 90%. HR increased to 98 BPM. O2 increased to 4L . O2 sat rate increased to 100%. HR rate decreased to 74 BPM. No signs/symptoms of respiratory distress. HOB is elevated. Call bell within reach. Will continue to monitor.

## 2019-05-22 NOTE — Progress Notes (Signed)
Assisted to bedpan at 2155, NT assisted patient from bedpan.   2335 peri care performed, cream applied to bottom, comforted patient.  Upon RN entering room stated needed help with bathroom, informed patient she was no longer on bedpan. Patient upset and frustrated that she was left in bed without repositiioning. Patient previously called NT asking to be taken off bedpan, however, had been previously assisted off of bedpan by NT.   Assisted to bedpan at [place]; no bowel movement.  Approx 0234 notified by charge RN patient stated heart feel like it's beating out of her chest. Patient HR on tele in hi 70's. RN to patient's bedside; upon auscultation S1S2 heard. Patient denied any chest pain or chest discomfort besides racing HR. Patient requested repositioning and stated has had family members die related to heart issues including her mother. Spoke with tele at 0255 to confirm no tachy, SVT nor rhythm changes; tele confirmed no changes.  Approx 0501 complaining of back and bottom discomfort, stated need bed pads to be repositioned; patient repositioned. Declined bathing until after 0700. Informed patient long term may need air mattress or other alternative to mitigate discomfort of back and bottom, patient emphasized need for repositioning.

## 2019-05-22 NOTE — Progress Notes (Signed)
Patient ID: Betty Bryan, female   DOB: 1955-12-16, 62 y.o.   MRN: [id number]  PROGRESS NOTE    Betty Bryan  [id number] DOB: [birth year] DOA: 05/20/2019 PCP: Crist Infante, MD   Brief Narrative:  63 year old female with history of adrenal insufficiency on chronic prednisone, A. fib on Eliquis, hypertension, colitis, recent admission to [facility] from 05/08/2019-05/14/2019 for sepsis secondary to pneumonia and left lower extremity cellulitis treated with vancomycin and aztreonam and transitioned to doxycycline presented on 05/20/2019 with fever/cough and generalized weakness.  She was admitted for sepsis secondary to pneumonia/cellulitis.  Cardiology was consulted for abnormal EKG and positive troponins.  Assessment & Plan:  Sepsis: Present on admission, from pneumonia/cellulitis -Cultures negative so far.  Antibiotics plan as below. -Hemodynamically improving.  Heart rate improving.  Blood pressures currently on the lower side but stable. -DC IV fluids.  Community-acquired pneumonia with concerns for gram-negative pneumonia versus MRSA pneumonia -Patient was recently treated at [facility] for pneumonia with broad-spectrum antibiotics and discharged on oral doxycycline -Chest x-ray on admission shows extensive airspace consolidation throughout the left lower lobe with pleural effusion -Currently on broad-spectrum antibiotics.  DC vancomycin.  Continue cefepime. -COVID-19 testing negative.  Procalcitonin was 0.2  Leukocytosis -Probably from above and steroid use.  Improving.  Monitor  Cellulitis around pressure ulcers -Wound care consult. Continue antibiotics  Hypertension - BP improving.  Continue current dose of atenolol.  Paroxysmal A. fib--continue atenolol and apixaban.  Abnormal EKG  -Cardiology has signed off.  Troponins trending down.  No chest pain.  Limited echo on 05/21/2019 showed EF of 60 to 65%.  Chronic adrenal insufficiency  -Continue cilostazol.  Decrease Solu-Cortef to 25  mg every 12 hours.  Switch to home dose of oral prednisone in the next 1 to 2 days  Hypokalemia -Improved.  Generalized deconditioning -PT eval.  DVT prophylaxis: Eliquis Code Status: DNR Family Communication: None at bedside Disposition Plan: Discharge in 2 to 3 days: Home versus SNF, depends on PT evaluation.  Consultants: Cardiology  Procedures: None  Antimicrobials:  Cefepime and vancomycin from 05/20/2019 onwards  Subjective: Patient seen and examined at bedside.  Still feels weak but feels better.  No overnight fever or vomiting.  No worsening shortness of breath.  Has intermittent cough.  Objective: Vitals:   05/22/19 0400 05/22/19 0624 05/22/19 0641 05/22/19 0754  BP:  103/70  102/73  Pulse:  78 77 81  Resp:  19 20 19   Temp: 97.9 F (36.6 C)     TempSrc: Oral     SpO2:  97% 94% 97%  Weight:      Height:        Intake/Output Summary (Last 24 hours) at 05/22/2019 0821 Last data filed at 05/21/2019 1607 Gross per 24 hour  Intake 1180.5 ml  Output -  Net 1180.5 ml   Filed Weights   05/20/19 1346  Weight: 64.8 kg    Examination:  General exam: No acute distress.   Respiratory system: Bilateral decreased breath sounds at bases with scattered crackles.  No wheezing  cardiovascular system: S1 & S2 heard, rate controlled  gastrointestinal system: Abdomen is nondistended, soft and nontender. Normal bowel sounds heard. Extremities: No cyanosis, edema      Data Reviewed: I have personally reviewed following labs and imaging studies  CBC: Recent Labs  Lab 05/20/19 1406 05/21/19 0620 05/22/19 0422  WBC 18.2* 18.6* 15.1*  NEUTROABS 15.1*  --  13.7*  HGB 13.4 10.9* 9.6*  HCT 42.1 34.7* 31.3*  MCV 94.4  94.0 96.0  PLT 413* 384 [id number]   Basic Metabolic Panel: Recent Labs  Lab 05/20/19 1406 05/20/19 1540 05/21/19 0620 05/22/19 0422  NA 139  --  143 144  K 2.8*  --  3.1* 3.7  CL 94*  --  107 113*  CO2 19*  --  20* 21*  GLUCOSE 131*  --  137* 100*   BUN 12  --  6* 7*  CREATININE 0.93  --  0.73 0.58  CALCIUM 8.5*  --  7.8* 8.1*  MG  --  1.6*  --  1.9   GFR: Estimated Creatinine Clearance: 61.2 mL/min (by C-G formula based on SCr of 0.58 mg/dL). Liver Function Tests: Recent Labs  Lab 05/20/19 1406 05/22/19 0422  AST 25 14*  ALT 24 19  ALKPHOS 160* 111  BILITOT 1.4* 0.8  PROT 6.3* 4.5*  ALBUMIN 2.7* 1.9*   No results for input(s): LIPASE, AMYLASE in the last 168 hours. No results for input(s): AMMONIA in the last 168 hours. Coagulation Profile: No results for input(s): INR, PROTIME in the last 168 hours. Cardiac Enzymes: No results for input(s): CKTOTAL, CKMB, CKMBINDEX, TROPONINI in the last 168 hours. BNP (last 3 results) No results for input(s): PROBNP in the last 8760 hours. HbA1C: No results for input(s): HGBA1C in the last 72 hours. CBG: No results for input(s): GLUCAP in the last 168 hours. Lipid Profile: No results for input(s): CHOL, HDL, LDLCALC, TRIG, CHOLHDL, LDLDIRECT in the last 72 hours. Thyroid Function Tests: No results for input(s): TSH, T4TOTAL, FREET4, T3FREE, THYROIDAB in the last 72 hours. Anemia Panel: No results for input(s): VITAMINB12, FOLATE, FERRITIN, TIBC, IRON, RETICCTPCT in the last 72 hours. Sepsis Labs: Recent Labs  Lab 05/20/19 1406 05/20/19 1530 05/20/19 1948 05/21/19 0620  PROCALCITON  --   --  0.20  --   LATICACIDVEN 3.7* 2.0* 1.9 1.0    Recent Results (from the past 240 hour(s))  Blood Culture (routine x 2)     Status: None (Preliminary result)   Collection Time: 05/20/19  2:00 PM   Specimen: BLOOD  Result Value Ref Range Status   Specimen Description BLOOD RIGHT ANTECUBITAL  Final   Special Requests   Final    BOTTLES DRAWN AEROBIC AND ANAEROBIC Blood Culture results may not be optimal due to an inadequate volume of blood received in culture bottles   Culture   Final    NO GROWTH < 24 HOURS Performed at [facility] [address] [place], [place] [address]     Report Status PENDING  Incomplete  Blood Culture (routine x 2)     Status: None (Preliminary result)   Collection Time: 05/20/19  2:06 PM   Specimen: BLOOD RIGHT HAND  Result Value Ref Range Status   Specimen Description BLOOD RIGHT HAND  Final   Special Requests   Final    BOTTLES DRAWN AEROBIC AND ANAEROBIC Blood Culture results may not be optimal due to an inadequate volume of blood received in culture bottles   Culture   Final    NO GROWTH < 24 HOURS Performed at [facility], [place] [address], [place], [place] [address]    Report Status PENDING  Incomplete  SARS Coronavirus 2 [facility] order, Performed in [facility] hospital lab) Nasopharyngeal Nasopharyngeal Swab     Status: None   Collection Time: 05/20/19  2:22 PM   Specimen: Nasopharyngeal Swab  Result Value Ref Range Status   SARS Coronavirus 2 NEGATIVE NEGATIVE Final  Comment: (NOTE) If result is NEGATIVE SARS-CoV-2 target nucleic acids are NOT DETECTED. The SARS-CoV-2 RNA is generally detectable in upper and lower  respiratory specimens during the acute phase of infection. The lowest  concentration of SARS-CoV-2 viral copies this assay can detect is 250  copies / mL. A negative result does not preclude SARS-CoV-2 infection  and should not be used as the sole basis for treatment or other  patient management decisions.  A negative result may occur with  improper specimen collection / handling, submission of specimen other  than nasopharyngeal swab, presence of viral mutation(s) within the  areas targeted by this assay, and inadequate number of viral copies  (<250 copies / mL). A negative result must be combined with clinical  observations, patient history, and epidemiological information. If result is POSITIVE SARS-CoV-2 target nucleic acids are DETECTED. The SARS-CoV-2 RNA is generally detectable in upper and lower  respiratory specimens dur ing the acute phase of infection.  Positive  results are indicative  of active infection with SARS-CoV-2.  Clinical  correlation with patient history and other diagnostic information is  necessary to determine patient infection status.  Positive results do  not rule out bacterial infection or co-infection with other viruses. If result is PRESUMPTIVE POSTIVE SARS-CoV-2 nucleic acids MAY BE PRESENT.   A presumptive positive result was obtained on the submitted specimen  and confirmed on repeat testing.  While 2019 novel coronavirus  (SARS-CoV-2) nucleic acids may be present in the submitted sample  additional confirmatory testing may be necessary for epidemiological  and / or clinical management purposes  to differentiate between  SARS-CoV-2 and other Sarbecovirus currently known to infect humans.  If clinically indicated additional testing with an alternate test  methodology [id number]) is advised. The SARS-CoV-2 RNA is generally  detectable in upper and lower respiratory sp ecimens during the acute  phase of infection. The expected result is Negative. Fact Sheet for Patients:  StrictlyIdeas.no Fact Sheet for Healthcare Providers: BankingDealers.co.za This test is not yet approved or cleared by the [place] FDA and has been authorized for detection and/or diagnosis of SARS-CoV-2 by FDA under an Emergency Use Authorization (EUA).  This EUA will remain in effect (meaning this test can be used) for the duration of the COVID-19 declaration under Section 564(b)(1) of the Act, 21 U.S.C. section 360bbb-3(b)(1), unless the authorization is terminated or revoked sooner. Performed at [facility], [place] [address], [place], [place] [address]          Radiology Studies: Dg Chest Port 1 View  Result Date: 05/20/2019 CLINICAL DATA:  Fever EXAM: PORTABLE CHEST 1 VIEW COMPARISON:  May 08, 2019 FINDINGS: There remains airspace consolidation throughout the left mid and lower lung zones with left pleural  effusion. The right lung is now essentially clear. Heart is mildly enlarged with pulmonary vascularity normal. No adenopathy. 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 No bone lesions. IMPRESSION: Extensive airspace consolidation throughout the left lower lobe with left pleural effusion. Right lung is now clear. Stable cardiac prominence. Electronically Signed   By: Lowella Grip III M.D.   On: 05/20/2019 15:13        Scheduled Meds: . apixaban  2.5 mg Oral BID  . atenolol  25 mg Oral Q8H  . balsalazide  2,250 mg Oral TID  . collagenase   Topical Daily  . docusate sodium  100 mg Oral BID  . fluticasone  1 spray Each Nare BID  . hydrocortisone  25 mg Rectal QHS  .  hydrocortisone sod succinate (SOLU-CORTEF) inj  25 mg Intravenous Q6H  . loratadine  10 mg Oral Daily  . pantoprazole  20 mg Oral Daily  . sodium chloride flush  3 mL Intravenous Q12H   Continuous Infusions: . 0.9 % NaCl with KCl 20 mEq / L 75 mL/hr at 05/21/19 2358  . ceFEPime (MAXIPIME) IV 2 g (05/22/19 0017)  . vancomycin 1,000 mg (05/21/19 1553)     LOS: 2 days        Aline August, MD [facility] 05/22/2019, 8:21 AM

## 2019-05-23 LAB — CBC WITH DIFFERENTIAL/PLATELET
Abs Immature Granulocytes: 0.32 10*3/uL — ABNORMAL HIGH (ref 0.00–0.07)
Basophils Absolute: 0 10*3/uL (ref 0.0–0.1)
Basophils Relative: 0 %
Eosinophils Absolute: 0 10*3/uL (ref 0.0–0.5)
Eosinophils Relative: 0 %
HCT: 35.9 % — ABNORMAL LOW (ref 36.0–46.0)
Hemoglobin: 11.3 g/dL — ABNORMAL LOW (ref 12.0–15.0)
Immature Granulocytes: 2 %
Lymphocytes Relative: 2 %
Lymphs Abs: 0.3 10*3/uL — ABNORMAL LOW (ref 0.7–4.0)
MCH: 29.7 pg (ref 26.0–34.0)
MCHC: 31.5 g/dL (ref 30.0–36.0)
MCV: 94.5 fL (ref 80.0–100.0)
Monocytes Absolute: 0.9 10*3/uL (ref 0.1–1.0)
Monocytes Relative: 5 %
Neutro Abs: 14.2 10*3/uL — ABNORMAL HIGH (ref 1.7–7.7)
Neutrophils Relative %: 91 %
Platelets: 416 10*3/uL — ABNORMAL HIGH (ref 150–400)
RBC: 3.8 MIL/uL — ABNORMAL LOW (ref 3.87–5.11)
RDW: 16.7 % — ABNORMAL HIGH (ref 11.5–15.5)
WBC: 15.8 10*3/uL — ABNORMAL HIGH (ref 4.0–10.5)
nRBC: 0 % (ref 0.0–0.2)

## 2019-05-23 LAB — LEGIONELLA PNEUMOPHILA SEROGP 1 UR AG: L. pneumophila Serogp 1 Ur Ag: NEGATIVE

## 2019-05-23 LAB — BASIC METABOLIC PANEL
Anion gap: 14 (ref 5–15)
BUN: 7 mg/dL — ABNORMAL LOW (ref 8–23)
CO2: 24 mmol/L (ref 22–32)
Calcium: 8.3 mg/dL — ABNORMAL LOW (ref 8.9–10.3)
Chloride: 103 mmol/L (ref 98–111)
Creatinine, Ser: 0.52 mg/dL (ref 0.44–1.00)
GFR calc Af Amer: >60 mL/min (ref >60)
GFR calc non Af Amer: >60 mL/min (ref >60)
Glucose, Bld: 84 mg/dL (ref 70–99)
Potassium: 2.7 mmol/L — CL (ref 3.5–5.1)
Sodium: 141 mmol/L (ref 135–145)

## 2019-05-23 LAB — MAGNESIUM: Magnesium: 1.8 mg/dL (ref 1.7–2.4)

## 2019-05-23 MED ORDER — FUROSEMIDE 20 MG PO TABS: 20.0000 mg | ORAL_TABLET | Freq: Every day | ORAL | Status: AC

## 2019-05-23 MED ORDER — CEFUROXIME AXETIL 500 MG PO TABS: 500.0000 mg | ORAL_TABLET | Freq: Two times a day (BID) | ORAL | 0 refills | Status: AC

## 2019-05-23 MED ORDER — COLLAGENASE 250 UNIT/GM EX OINT: TOPICAL_OINTMENT | Freq: Every day | CUTANEOUS | 0 refills | Status: DC

## 2019-05-23 MED ORDER — DOXYCYCLINE HYCLATE 100 MG PO TABS: 100.0000 mg | ORAL_TABLET | Freq: Two times a day (BID) | ORAL | 0 refills | Status: AC

## 2019-05-23 MED ORDER — CALCIUM CARBONATE-VITAMIN D 500-200 MG-UNIT PO TABS: 1.0000 | ORAL_TABLET | Freq: Every day | ORAL | Status: AC

## 2019-05-23 MED ORDER — DOCUSATE SODIUM 100 MG PO CAPS: 100.0000 mg | ORAL_CAPSULE | Freq: Two times a day (BID) | ORAL | 0 refills | Status: DC

## 2019-05-23 MED ORDER — LOPERAMIDE HCL 2 MG PO CAPS
2.0000 mg | ORAL_CAPSULE | Freq: Four times a day (QID) | ORAL | Status: DC | PRN
  Administered 2019-05-23: 2 mg via ORAL
  Filled 2019-05-23: qty 1

## 2019-05-23 MED ORDER — ONDANSETRON HCL 4 MG PO TABS: 4.0000 mg | ORAL_TABLET | Freq: Four times a day (QID) | ORAL | 0 refills | Status: DC | PRN

## 2019-05-23 MED ORDER — POLYETHYLENE GLYCOL 3350 17 G PO PACK: 17.0000 g | PACK | Freq: Every day | ORAL | 0 refills | Status: DC | PRN

## 2019-05-23 MED ORDER — DIAZEPAM 5 MG PO TABS: 1.2500 mg | ORAL_TABLET | Freq: Two times a day (BID) | ORAL | Status: DC | PRN

## 2019-05-23 MED ORDER — POTASSIUM CHLORIDE CRYS ER 20 MEQ PO TBCR
40.0000 meq | EXTENDED_RELEASE_TABLET | ORAL | Status: AC
  Administered 2019-05-23 (×2): 40 meq via ORAL
  Filled 2019-05-23 (×2): qty 2

## 2019-05-23 MED ORDER — ATENOLOL 25 MG PO TABS: 25.0000 mg | ORAL_TABLET | Freq: Three times a day (TID) | ORAL | 0 refills | Status: DC

## 2019-05-23 NOTE — Progress Notes (Signed)
Patient tearful this afternoon - said she talked to a doctor in the past and the doctor told her that many people don't get over pneumonia. She is very upset and she will not recover from this illness. Has had "five plus" bowel movements this afternoon. Inquiring about imodium. Dr. Starla Link paged. Will continue to monitor.  Hiram Comber, RN 05/23/2019 5:34 PM

## 2019-05-23 NOTE — Progress Notes (Signed)
Patient called out and said that she is not feeling well. Upon assessment, patient states she feels too weak and wishes to stay one more day longer in the hospital. Dr. Starla Link made aware and discharge cancelled.   Hiram Comber, RN 05/23/2019 12:12 PM

## 2019-05-23 NOTE — Progress Notes (Signed)
CRITICAL VALUE ALERT  Critical Value:  K+ 2.7  Date & Time Notied:  0921 05/23/2019  Provider Notified: Dr. Starla Link notified  Orders Received/Actions taken: orders received to replace potassium prior to discharge

## 2019-05-23 NOTE — Discharge Summary (Addendum)
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 1406 05/21/19 0620 05/22/19 0422 05/23/19 0828  WBC 18.2* 18.6* 15.1* 15.8*  NEUTROABS 15.1*  --  13.7* 14.2*  HGB 13.4 10.9* 9.6* 11.3*  HCT 42.1 34.7* 31.3* 35.9*  MCV 94.4 94.0 96.0 94.5  PLT 413* 384 378 416*   Cardiac Enzymes: No results for input(s): CKTOTAL, CKMB, CKMBINDEX, TROPONINI in the last 168 hours. BNP: Invalid input(s): POCBNP CBG: No results for input(s): GLUCAP in the last 168 hours. D-Dimer No results for input(s): DDIMER in the last 72 hours. Hgb A1c No results for input(s): HGBA1C in the last 72 hours. Lipid Profile No results for input(s): CHOL, HDL, LDLCALC, TRIG, CHOLHDL, LDLDIRECT in the last 72 hours. Thyroid function studies No results for input(s): TSH, T4TOTAL, T3FREE, THYROIDAB in the last 72 hours.  Invalid input(s): FREET3 Anemia work up No results for input(s):  VITAMINB12, FOLATE, FERRITIN, TIBC, IRON, RETICCTPCT in the last 72 hours. Urinalysis    Component Value Date/Time   COLORURINE YELLOW 05/20/2019 2101   APPEARANCEUR HAZY (A) 05/20/2019 2101   LABSPEC 1.015 05/20/2019 2101   PHURINE 5.0 05/20/2019 2101   GLUCOSEU NEGATIVE 05/20/2019 2101   HGBUR NEGATIVE 05/20/2019 2101   BILIRUBINUR NEGATIVE 05/20/2019 2101   KETONESUR 80 (A) 05/20/2019 2101   PROTEINUR 30 (A) 05/20/2019 2101  NITRITE NEGATIVE 05/20/2019 2101   LEUKOCYTESUR SMALL (A) 05/20/2019 2101   Sepsis Labs Invalid input(s): PROCALCITONIN,  WBC,  LACTICIDVEN Microbiology Recent Results (from the past 240 hour(s))  Blood Culture (routine x 2)     Status: None (Preliminary result)   Collection Time: 05/20/19  2:00 PM   Specimen: BLOOD  Result Value Ref Range Status   Specimen Description BLOOD RIGHT ANTECUBITAL  Final   Special Requests   Final    BOTTLES DRAWN AEROBIC AND ANAEROBIC Blood Culture results may not be optimal due to an inadequate volume of blood received in culture bottles   Culture   Final    NO GROWTH 2 DAYS Performed at [facility] [address], [place], [place] [address]    Report Status PENDING  Incomplete  Blood Culture (routine x 2)     Status: None (Preliminary result)   Collection Time: 05/20/19  2:06 PM   Specimen: BLOOD RIGHT HAND  Result Value Ref Range Status   Specimen Description BLOOD RIGHT HAND  Final   Special Requests   Final    BOTTLES DRAWN AEROBIC AND ANAEROBIC Blood Culture results may not be optimal due to an inadequate volume of blood received in culture bottles   Culture   Final    NO GROWTH 2 DAYS Performed at [facility], [place] [address], [place], [place] [address]    Report Status PENDING  Incomplete  SARS Coronavirus 2 [facility] order, Performed in [facility] hospital lab) Nasopharyngeal Nasopharyngeal Swab     Status: None   Collection Time: 05/20/19  2:22 PM   Specimen: Nasopharyngeal Swab  Result  Value Ref Range Status   SARS Coronavirus 2 NEGATIVE NEGATIVE Final    Comment: (NOTE) If result is NEGATIVE SARS-CoV-2 target nucleic acids are NOT DETECTED. The SARS-CoV-2 RNA is generally detectable in upper and lower  respiratory specimens during the acute phase of infection. The lowest  concentration of SARS-CoV-2 viral copies this assay can detect is 250  copies / mL. A negative result does not preclude SARS-CoV-2 infection  and should not be used as the sole basis for treatment or other  patient management decisions.  A negative result may occur with  improper specimen collection / handling, submission of specimen other  than nasopharyngeal swab, presence of viral mutation(s) within the  areas targeted by this assay, and inadequate number of viral copies  (<250 copies / mL). A negative result must be combined with clinical  observations, patient history, and epidemiological information. If result is POSITIVE SARS-CoV-2 target nucleic acids are DETECTED. The SARS-CoV-2 RNA is generally detectable in upper and lower  respiratory specimens dur ing the acute phase of infection.  Positive  results are indicative of active infection with SARS-CoV-2.  Clinical  correlation with patient history and other diagnostic information is  necessary to determine patient infection status.  Positive results do  not rule out bacterial infection or co-infection with other viruses. If result is PRESUMPTIVE POSTIVE SARS-CoV-2 nucleic acids MAY BE PRESENT.   A presumptive positive result was obtained on the submitted specimen  and confirmed on repeat testing.  While 2019 novel coronavirus  (SARS-CoV-2) nucleic acids may be present in the submitted sample  additional confirmatory testing may be necessary for epidemiological  and / or clinical management purposes  to differentiate between  SARS-CoV-2 and other Sarbecovirus currently known to infect humans.  If clinically indicated additional testing  with an alternate test  methodology [phone]) is  advised. The SARS-CoV-2 RNA is generally  detectable in upper and lower respiratory sp ecimens during the acute  phase of infection. The expected result is Negative. Fact Sheet for Patients:  StrictlyIdeas.no Fact Sheet for Healthcare Providers: BankingDealers.co.za This test is not yet approved or cleared by the [place] FDA and has been authorized for detection and/or diagnosis of SARS-CoV-2 by FDA under an Emergency Use Authorization (EUA).  This EUA will remain in effect (meaning this test can be used) for the duration of the COVID-19 declaration under Section 564(b)(1) of the Act, 21 U.S.C. section 360bbb-3(b)(1), unless the authorization is terminated or revoked sooner. Performed at [facility], [place] [address], [place], [place] [address]      Time coordinating discharge: 35 minutes  SIGNED:   Aline August, MD  [facility] 05/23/2019, 9:04 AM

## 2019-05-23 NOTE — TOC Transition Note (Signed)
Transition of Care [facility]) - CM/SW Discharge Note   Patient Details  Name: Betty Bryan MRN: [id number] Date of Birth: 1956/05/09  Transition of Care [facility]) CM/SW Contact:  Carles Collet, RN Phone Number: 05/23/2019, 9:45 AM   Clinical Narrative:   Verified patient declines SNF. She wishes to return home and resume services w [facility]. Liaison notified of DC today. Patient will have friend Milbert Coulter provide transport home and bring home oxygen. Patient declines hoyer as rec by PT and states she has all needed DME at home.     Final next level of care: [place] Barriers to Discharge: No Barriers Identified   Patient Goals and CMS Choice Patient states their goals for this hospitalization and ongoing recovery are:: wants to return home CMS Medicare.gov Compare Post Acute Care list provided to:: Patient Choice offered to / list presented to : Patient  Discharge Placement                       Discharge Plan and Services                          HH Arranged: PT, RN [facility] Agency: [place] Date [facility] Agency Contacted: 05/23/19 Time [place]: [phone] Representative spoke with at [place]: [place] ([place]) Interventions     Readmission Risk Interventions Readmission Risk Prevention Plan 05/14/2019 05/11/2019 01/29/2019  Transportation Screening - Complete -  PCP or Specialist Appt within 5-7 Days - - Not Complete  Not Complete comments - - Left 2 messages for Dr office scheduler with no return call.  PCP or Specialist Appt within 3-5 Days Not Complete - -  Not Complete comments Message left at clinic. Did not receive return call - -  [place] or [place] - Complete -  Social Work Consult for [place] Planning/Counseling - Complete -  Palliative Care Screening Complete - -  Medication Review (RN Care Manager) - Complete -  Some recent data might be hidden

## 2019-05-24 MED ORDER — LOPERAMIDE HCL 2 MG PO CAPS: 2.0000 mg | ORAL_CAPSULE | Freq: Four times a day (QID) | ORAL | 0 refills | Status: DC | PRN

## 2019-05-24 NOTE — Progress Notes (Signed)
Pharmacy Antibiotic Note  Betty Bryan is a 63 y.o. female admitted on 05/20/2019 with sepsis of unknown source. Recently admitted to [facility] 8/15-8/21 for sepsis 2/2 CAP and cellulitis, received vanc/aztreonam and 1 dose of levofloxacin before being discharged on doxy. Pharmacy has been consulted for cefepime dosing.   Pt reports anaphylactic allergy to penicillins and has never received cephalosporins before. Spoke to MD, okay to continue with cefepime and monitor closely for anaphylaxis. Bedside nurse has been informed.  Patient to go home on ceftin and doxy, but discharge delayed. Patient still on cefepime IV. (Doxy not started)  Plan: Cefepime 2 gm IV q8h Monitor closely for anaphylaxis to cefepime Start Doxy prior to discharge? F/U cxs, renal fxn, clinical improvement, and abx de-escalation as needed  Height: 5' (152.4 cm) Weight: 153 lb (69.4 kg) IBW/kg (Calculated) : 45.5  Temp (24hrs), Avg:98.2 F (36.8 C), Min:97.9 F (36.6 C), Max:98.4 F (36.9 C)  Recent Labs  Lab 05/20/19 1406 05/20/19 1530 05/20/19 1948 05/21/19 0620 05/22/19 0422 05/23/19 0828  WBC 18.2*  --   --  18.6* 15.1* 15.8*  CREATININE 0.93  --   --  0.73 0.58 0.52  LATICACIDVEN 3.7* 2.0* 1.9 1.0  --   --     Estimated Creatinine Clearance: 63.4 mL/min (by C-G formula based on SCr of 0.52 mg/dL).    Allergies  Allergen Reactions  . Penicillins Anaphylaxis    Has patient had a PCN reaction causing immediate rash, facial/tongue/throat swelling, SOB or lightheadedness with hypotension: Yes Has patient had a PCN reaction causing severe rash involving mucus membranes or skin necrosis: Yes Has patient had a PCN reaction that required hospitalization: No Has patient had a PCN reaction occurring within the last 10 years: No If all of the above answers are "NO", then may proceed with Cephalosporin use.   . Sulfa Antibiotics Anaphylaxis  . Morphine And Related Nausea And Vomiting  . Voltaren [Diclofenac]    Chest pains - Voltaren Gel     Antimicrobials this admission: 8/27 Vanc >> 8/28 8/27 Cefepime >>  Microbiology results: 8/27 BCx: sent  Betty Quinteros A. Levada Dy, PharmD, BCPS, FNKF Clinical Pharmacist [place] Please utilize Amion for appropriate phone number to reach the unit pharmacist ([place])   05/24/2019 7:30 AM

## 2019-05-24 NOTE — Consult Note (Signed)
   [facility] CM Inpatient Consult   05/24/2019  Betty Bryan 06-02-1956 [id number]   Patient screened for high risk score for unplanned readmission score [27%] and for less than 7 days re-hospitalization.    Review of patient's medical record reveals patient is from MD's discharge summary notes includes but not limited to:  63 year old female with history of adrenal insufficiency on chronic prednisone, A. fib on Eliquis, hypertension, colitis, recent admission to [facility] from 05/08/2019-05/14/2019 for sepsis secondary to pneumonia and left lower extremity cellulitis treated with vancomycin and aztreonam and transitioned to doxycycline presented on 05/20/2019 with fever/cough and generalized weakness. She was admitted for sepsis secondary to pneumonia/cellulitis. Cardiology was consulted for abnormal EKG and positive troponins.  Patient did not complain of any chest pain.  Primary Care Provider is Dr. Crist Infante, [facility].  Call placed to patient's hospital room and no answer at this time. Chart reviewed and patient could benefit from EMMI follow up.  Plan:  Follow up with inpatient [facility] team to determine if there are [place] Management needs.   Please place a [facility] Care Management consult as appropriate and for questions contact:   Natividad Brood, RN BSN [facility] Liaison  [phone] business mobile phone Toll free office [phone]  Fax number: [phone] [email or web address]

## 2019-05-24 NOTE — Progress Notes (Signed)
Pt given discharge instructions, prescriptions, and care notes. Pt verbalized understanding AEB no further questions or concerns at this time. IV was discontinued, no redness, pain, or swelling noted at this time. Telemetry discontinued and Centralized Telemetry was notified. Pt left the floor via wheelchair with staff in stable condition. Pt needed assistance getting into vehicle. RN and NT picked pt up by arms and legs and placed pt in vehicle. Upon situating pt, we noticed pt had a skin tear on bilateral upper arms. Both RN and NT unaware of what tore skin. Pt stated that she was glad to be safely put into vehicle and wasn't worried about her skin. RN cleaned both tears, returned torn skin back to its place and placed foam dressings on both. Charge RN aware.

## 2019-05-24 NOTE — Progress Notes (Signed)
Patient ID: Betty Bryan, female   DOB: 1955/10/28, 63 y.o.   MRN: [id number] Patient was supposed to be discharged on 05/23/2019 but after discharge orders were put in, she stated that she did not feel well and wanted to stay 1 more day.  Patient feels better this morning and is okay going home.  Patient seen and examined at bedside.  She is medically stable for discharge.  She will be discharged home today.  Please refer to the discharge summary done by me on 05/23/2019 for full details.

## 2019-05-25 LAB — CULTURE, BLOOD (ROUTINE X 2)
Culture: NO GROWTH
Culture: NO GROWTH

## 2019-06-01 DIAGNOSIS — R000 Tachycardia, unspecified: Secondary | ICD-10-CM | POA: Diagnosis not present

## 2019-06-01 DIAGNOSIS — L8990 Pressure ulcer of unspecified site, unspecified stage: Secondary | ICD-10-CM | POA: Diagnosis not present

## 2019-06-01 DIAGNOSIS — R600 Localized edema: Secondary | ICD-10-CM | POA: Diagnosis not present

## 2019-06-01 DIAGNOSIS — R112 Nausea with vomiting, unspecified: Secondary | ICD-10-CM | POA: Diagnosis not present

## 2019-06-01 DIAGNOSIS — Z1331 Encounter for screening for depression: Secondary | ICD-10-CM | POA: Diagnosis not present

## 2019-06-06 DIAGNOSIS — I10 Essential (primary) hypertension: Secondary | ICD-10-CM | POA: Diagnosis not present

## 2019-06-06 DIAGNOSIS — I4891 Unspecified atrial fibrillation: Secondary | ICD-10-CM | POA: Diagnosis not present

## 2019-06-06 DIAGNOSIS — J189 Pneumonia, unspecified organism: Secondary | ICD-10-CM | POA: Diagnosis not present

## 2019-06-06 DIAGNOSIS — N185 Chronic kidney disease, stage 5: Secondary | ICD-10-CM | POA: Diagnosis not present

## 2019-06-06 DIAGNOSIS — I129 Hypertensive chronic kidney disease with stage 1 through stage 4 chronic kidney disease, or unspecified chronic kidney disease: Secondary | ICD-10-CM | POA: Diagnosis not present

## 2019-06-06 DIAGNOSIS — J9611 Chronic respiratory failure with hypoxia: Secondary | ICD-10-CM | POA: Diagnosis not present

## 2019-06-08 DIAGNOSIS — Z5189 Encounter for other specified aftercare: Secondary | ICD-10-CM | POA: Diagnosis not present

## 2019-06-08 DIAGNOSIS — I87319 Chronic venous hypertension (idiopathic) with ulcer of unspecified lower extremity: Secondary | ICD-10-CM | POA: Diagnosis not present

## 2019-06-08 DIAGNOSIS — I10 Essential (primary) hypertension: Secondary | ICD-10-CM | POA: Diagnosis not present

## 2019-06-09 ENCOUNTER — Encounter (HOSPITAL_COMMUNITY): Payer: BC Managed Care – PPO

## 2019-06-12 ENCOUNTER — Emergency Department (HOSPITAL_COMMUNITY): Payer: BC Managed Care – PPO

## 2019-06-12 ENCOUNTER — Other Ambulatory Visit: Payer: Self-pay

## 2019-06-12 ENCOUNTER — Inpatient Hospital Stay (HOSPITAL_COMMUNITY)
Admission: EM | Admit: 2019-06-12 | Discharge: 2019-06-16 | DRG: 542 | Disposition: A | Discharge status: Home Health | Payer: BC Managed Care – PPO | Attending: Internal Medicine | Admitting: Internal Medicine

## 2019-06-12 ENCOUNTER — Encounter (HOSPITAL_COMMUNITY): Payer: Self-pay | Admitting: Emergency Medicine

## 2019-06-12 DIAGNOSIS — Z880 Allergy status to penicillin: Secondary | ICD-10-CM

## 2019-06-12 DIAGNOSIS — Z20828 Contact with and (suspected) exposure to other viral communicable diseases: Secondary | ICD-10-CM | POA: Diagnosis present

## 2019-06-12 DIAGNOSIS — R000 Tachycardia, unspecified: Secondary | ICD-10-CM

## 2019-06-12 DIAGNOSIS — Z8249 Family history of ischemic heart disease and other diseases of the circulatory system: Secondary | ICD-10-CM | POA: Diagnosis not present

## 2019-06-12 DIAGNOSIS — S22060A Wedge compression fracture of T7-T8 vertebra, initial encounter for closed fracture: Secondary | ICD-10-CM | POA: Diagnosis not present

## 2019-06-12 DIAGNOSIS — K5190 Ulcerative colitis, unspecified, without complications: Secondary | ICD-10-CM | POA: Diagnosis not present

## 2019-06-12 DIAGNOSIS — R918 Other nonspecific abnormal finding of lung field: Secondary | ICD-10-CM | POA: Diagnosis not present

## 2019-06-12 DIAGNOSIS — Z79899 Other long term (current) drug therapy: Secondary | ICD-10-CM | POA: Diagnosis not present

## 2019-06-12 DIAGNOSIS — Z86718 Personal history of other venous thrombosis and embolism: Secondary | ICD-10-CM | POA: Diagnosis not present

## 2019-06-12 DIAGNOSIS — I872 Venous insufficiency (chronic) (peripheral): Secondary | ICD-10-CM | POA: Diagnosis present

## 2019-06-12 DIAGNOSIS — M8008XA Age-related osteoporosis with current pathological fracture, vertebra(e), initial encounter for fracture: Secondary | ICD-10-CM | POA: Diagnosis not present

## 2019-06-12 DIAGNOSIS — Z7951 Long term (current) use of inhaled steroids: Secondary | ICD-10-CM | POA: Diagnosis not present

## 2019-06-12 DIAGNOSIS — Z9981 Dependence on supplemental oxygen: Secondary | ICD-10-CM

## 2019-06-12 DIAGNOSIS — Z7952 Long term (current) use of systemic steroids: Secondary | ICD-10-CM | POA: Diagnosis not present

## 2019-06-12 DIAGNOSIS — S32000A Wedge compression fracture of unspecified lumbar vertebra, initial encounter for closed fracture: Secondary | ICD-10-CM | POA: Diagnosis not present

## 2019-06-12 DIAGNOSIS — J9621 Acute and chronic respiratory failure with hypoxia: Secondary | ICD-10-CM | POA: Diagnosis present

## 2019-06-12 DIAGNOSIS — K5900 Constipation, unspecified: Secondary | ICD-10-CM | POA: Diagnosis present

## 2019-06-12 DIAGNOSIS — R109 Unspecified abdominal pain: Secondary | ICD-10-CM | POA: Diagnosis not present

## 2019-06-12 DIAGNOSIS — S32030A Wedge compression fracture of third lumbar vertebra, initial encounter for closed fracture: Secondary | ICD-10-CM | POA: Diagnosis not present

## 2019-06-12 DIAGNOSIS — R339 Retention of urine, unspecified: Secondary | ICD-10-CM | POA: Diagnosis not present

## 2019-06-12 DIAGNOSIS — I10 Essential (primary) hypertension: Secondary | ICD-10-CM | POA: Diagnosis present

## 2019-06-12 DIAGNOSIS — Z7189 Other specified counseling: Secondary | ICD-10-CM

## 2019-06-12 DIAGNOSIS — Z885 Allergy status to narcotic agent status: Secondary | ICD-10-CM | POA: Diagnosis not present

## 2019-06-12 DIAGNOSIS — Z515 Encounter for palliative care: Secondary | ICD-10-CM | POA: Diagnosis not present

## 2019-06-12 DIAGNOSIS — J90 Pleural effusion, not elsewhere classified: Secondary | ICD-10-CM | POA: Diagnosis not present

## 2019-06-12 DIAGNOSIS — I480 Paroxysmal atrial fibrillation: Secondary | ICD-10-CM | POA: Diagnosis not present

## 2019-06-12 DIAGNOSIS — M4850XA Collapsed vertebra, not elsewhere classified, site unspecified, initial encounter for fracture: Secondary | ICD-10-CM | POA: Diagnosis present

## 2019-06-12 DIAGNOSIS — E876 Hypokalemia: Secondary | ICD-10-CM | POA: Diagnosis not present

## 2019-06-12 DIAGNOSIS — Z882 Allergy status to sulfonamides status: Secondary | ICD-10-CM

## 2019-06-12 DIAGNOSIS — R0602 Shortness of breath: Secondary | ICD-10-CM | POA: Diagnosis not present

## 2019-06-12 DIAGNOSIS — R52 Pain, unspecified: Secondary | ICD-10-CM | POA: Diagnosis present

## 2019-06-12 DIAGNOSIS — R609 Edema, unspecified: Secondary | ICD-10-CM | POA: Diagnosis not present

## 2019-06-12 DIAGNOSIS — M4850XD Collapsed vertebra, not elsewhere classified, site unspecified, subsequent encounter for fracture with routine healing: Secondary | ICD-10-CM | POA: Diagnosis not present

## 2019-06-12 DIAGNOSIS — S22000A Wedge compression fracture of unspecified thoracic vertebra, initial encounter for closed fracture: Secondary | ICD-10-CM

## 2019-06-12 DIAGNOSIS — J189 Pneumonia, unspecified organism: Secondary | ICD-10-CM | POA: Diagnosis not present

## 2019-06-12 DIAGNOSIS — L89322 Pressure ulcer of left buttock, stage 2: Secondary | ICD-10-CM | POA: Diagnosis present

## 2019-06-12 DIAGNOSIS — Z66 Do not resuscitate: Secondary | ICD-10-CM | POA: Diagnosis present

## 2019-06-12 DIAGNOSIS — J9611 Chronic respiratory failure with hypoxia: Secondary | ICD-10-CM | POA: Diagnosis not present

## 2019-06-12 DIAGNOSIS — S22040A Wedge compression fracture of fourth thoracic vertebra, initial encounter for closed fracture: Secondary | ICD-10-CM | POA: Diagnosis not present

## 2019-06-12 DIAGNOSIS — Z888 Allergy status to other drugs, medicaments and biological substances status: Secondary | ICD-10-CM

## 2019-06-12 DIAGNOSIS — L304 Erythema intertrigo: Secondary | ICD-10-CM | POA: Diagnosis present

## 2019-06-12 DIAGNOSIS — Z7901 Long term (current) use of anticoagulants: Secondary | ICD-10-CM

## 2019-06-12 DIAGNOSIS — T380X5A Adverse effect of glucocorticoids and synthetic analogues, initial encounter: Secondary | ICD-10-CM | POA: Diagnosis present

## 2019-06-12 DIAGNOSIS — S32020A Wedge compression fracture of second lumbar vertebra, initial encounter for closed fracture: Secondary | ICD-10-CM | POA: Diagnosis not present

## 2019-06-12 DIAGNOSIS — E2740 Unspecified adrenocortical insufficiency: Secondary | ICD-10-CM | POA: Diagnosis present

## 2019-06-12 DIAGNOSIS — E8770 Fluid overload, unspecified: Secondary | ICD-10-CM | POA: Diagnosis not present

## 2019-06-12 LAB — URINALYSIS, ROUTINE W REFLEX MICROSCOPIC
Bacteria, UA: NONE SEEN
Bilirubin Urine: NEGATIVE
Bilirubin Urine: NEGATIVE
Glucose, UA: NEGATIVE mg/dL
Glucose, UA: NEGATIVE mg/dL
Hgb urine dipstick: NEGATIVE
Ketones, ur: NEGATIVE mg/dL
Ketones, ur: NEGATIVE mg/dL
Leukocytes,Ua: NEGATIVE
Leukocytes,Ua: NEGATIVE
Nitrite: NEGATIVE
Nitrite: NEGATIVE
Protein, ur: NEGATIVE mg/dL
Protein, ur: NEGATIVE mg/dL
Specific Gravity, Urine: 1.005 (ref 1.005–1.030)
Specific Gravity, Urine: 1.02 (ref 1.005–1.030)
pH: 8 (ref 5.0–8.0)
pH: 7 (ref 5.0–8.0)

## 2019-06-12 LAB — COMPREHENSIVE METABOLIC PANEL
ALT: 29 U/L (ref 0–44)
AST: 28 U/L (ref 15–41)
Albumin: 2.8 g/dL — ABNORMAL LOW (ref 3.5–5.0)
Alkaline Phosphatase: 134 U/L — ABNORMAL HIGH (ref 38–126)
Anion gap: 9 (ref 5–15)
BUN: 7 mg/dL — ABNORMAL LOW (ref 8–23)
CO2: 28 mmol/L (ref 22–32)
Calcium: 8.5 mg/dL — ABNORMAL LOW (ref 8.9–10.3)
Chloride: 105 mmol/L (ref 98–111)
Creatinine, Ser: 0.4 mg/dL — ABNORMAL LOW (ref 0.44–1.00)
GFR calc Af Amer: >60 mL/min (ref >60)
GFR calc non Af Amer: >60 mL/min (ref >60)
Glucose, Bld: 82 mg/dL (ref 70–99)
Potassium: 3.9 mmol/L (ref 3.5–5.1)
Sodium: 142 mmol/L (ref 135–145)
Total Bilirubin: 0.8 mg/dL (ref 0.3–1.2)
Total Protein: 5.2 g/dL — ABNORMAL LOW (ref 6.5–8.1)

## 2019-06-12 LAB — CBC WITH DIFFERENTIAL/PLATELET
Abs Immature Granulocytes: 0.88 10*3/uL — ABNORMAL HIGH (ref 0.00–0.07)
Basophils Absolute: 0.1 10*3/uL (ref 0.0–0.1)
Basophils Relative: 1 %
Eosinophils Absolute: 0.1 10*3/uL (ref 0.0–0.5)
Eosinophils Relative: 0 %
HCT: 41.7 % (ref 36.0–46.0)
Hemoglobin: 12.4 g/dL (ref 12.0–15.0)
Immature Granulocytes: 5 %
Lymphocytes Relative: 4 %
Lymphs Abs: 0.7 10*3/uL (ref 0.7–4.0)
MCH: 29.6 pg (ref 26.0–34.0)
MCHC: 29.7 g/dL — ABNORMAL LOW (ref 30.0–36.0)
MCV: 99.5 fL (ref 80.0–100.0)
Monocytes Absolute: 1.6 10*3/uL — ABNORMAL HIGH (ref 0.1–1.0)
Monocytes Relative: 8 %
Neutro Abs: 15.7 10*3/uL — ABNORMAL HIGH (ref 1.7–7.7)
Neutrophils Relative %: 82 %
Platelets: 195 10*3/uL (ref 150–400)
RBC: 4.19 MIL/uL (ref 3.87–5.11)
RDW: 16.1 % — ABNORMAL HIGH (ref 11.5–15.5)
WBC: 19 10*3/uL — ABNORMAL HIGH (ref 4.0–10.5)
nRBC: 0.1 % (ref 0.0–0.2)

## 2019-06-12 LAB — BRAIN NATRIURETIC PEPTIDE: B Natriuretic Peptide: 321 pg/mL — ABNORMAL HIGH (ref 0.0–100.0)

## 2019-06-12 LAB — LIPASE, BLOOD: Lipase: 59 U/L — ABNORMAL HIGH (ref 11–51)

## 2019-06-12 LAB — LACTIC ACID, PLASMA
Lactic Acid, Venous: 1.3 mmol/L (ref 0.5–1.9)
Lactic Acid, Venous: 1.9 mmol/L (ref 0.5–1.9)

## 2019-06-12 MED ORDER — ATENOLOL 25 MG PO TABS
25.0000 mg | ORAL_TABLET | Freq: Once | ORAL | Status: AC
  Administered 2019-06-12: 25 mg via ORAL
  Filled 2019-06-12: qty 1

## 2019-06-12 MED ORDER — ACETAMINOPHEN 650 MG RE SUPP: 650.0000 mg | Freq: Four times a day (QID) | RECTAL | Status: DC | PRN

## 2019-06-12 MED ORDER — NYSTATIN 100000 UNIT/GM EX CREA
TOPICAL_CREAM | Freq: Two times a day (BID) | CUTANEOUS | Status: DC
  Administered 2019-06-13 – 2019-06-16 (×5): via TOPICAL
  Filled 2019-06-12 (×2): qty 15

## 2019-06-12 MED ORDER — FUROSEMIDE 20 MG PO TABS: 20.0000 mg | ORAL_TABLET | Freq: Every day | ORAL | Status: DC

## 2019-06-12 MED ORDER — HYDROMORPHONE HCL 1 MG/ML IJ SOLN
0.5000 mg | INTRAMUSCULAR | Status: DC | PRN
  Administered 2019-06-12 – 2019-06-13 (×4): 0.5 mg via INTRAVENOUS
  Filled 2019-06-12: qty 0.5
  Filled 2019-06-12: qty 1
  Filled 2019-06-12 (×3): qty 0.5

## 2019-06-12 MED ORDER — PANTOPRAZOLE SODIUM 20 MG PO TBEC
20.0000 mg | DELAYED_RELEASE_TABLET | Freq: Every day | ORAL | Status: DC
  Filled 2019-06-12 (×3): qty 1

## 2019-06-12 MED ORDER — HYDROCORTISONE ACETATE 25 MG RE SUPP
25.0000 mg | Freq: Every day | RECTAL | Status: DC
  Administered 2019-06-12 – 2019-06-15 (×4): 25 mg via RECTAL
  Filled 2019-06-12 (×4): qty 1

## 2019-06-12 MED ORDER — FENTANYL CITRATE (PF) 100 MCG/2ML IJ SOLN
50.0000 ug | Freq: Once | INTRAMUSCULAR | Status: AC
  Administered 2019-06-12: 50 ug via INTRAVENOUS
  Filled 2019-06-12: qty 2

## 2019-06-12 MED ORDER — APIXABAN 2.5 MG PO TABS
2.5000 mg | ORAL_TABLET | Freq: Two times a day (BID) | ORAL | Status: DC
  Administered 2019-06-12 – 2019-06-16 (×8): 2.5 mg via ORAL
  Filled 2019-06-12 (×8): qty 1

## 2019-06-12 MED ORDER — ATENOLOL 25 MG PO TABS
25.0000 mg | ORAL_TABLET | Freq: Three times a day (TID) | ORAL | Status: DC
  Administered 2019-06-13 – 2019-06-16 (×10): 25 mg via ORAL
  Filled 2019-06-12 (×10): qty 1

## 2019-06-12 MED ORDER — IOHEXOL 300 MG/ML  SOLN
100.0000 mL | Freq: Once | INTRAMUSCULAR | Status: AC | PRN
  Administered 2019-06-12: 100 mL via INTRAVENOUS

## 2019-06-12 MED ORDER — DOCUSATE SODIUM 100 MG PO CAPS
100.0000 mg | ORAL_CAPSULE | Freq: Two times a day (BID) | ORAL | Status: DC
  Administered 2019-06-13 – 2019-06-15 (×4): 100 mg via ORAL
  Filled 2019-06-12 (×8): qty 1

## 2019-06-12 MED ORDER — LORATADINE 10 MG PO TABS
10.0000 mg | ORAL_TABLET | Freq: Every day | ORAL | Status: DC
  Administered 2019-06-13 – 2019-06-16 (×4): 10 mg via ORAL
  Filled 2019-06-12 (×4): qty 1

## 2019-06-12 MED ORDER — PROCHLORPERAZINE EDISYLATE 10 MG/2ML IJ SOLN
5.0000 mg | Freq: Once | INTRAMUSCULAR | Status: AC
  Administered 2019-06-12: 5 mg via INTRAVENOUS
  Filled 2019-06-12: qty 2

## 2019-06-12 MED ORDER — METHOCARBAMOL 1000 MG/10ML IJ SOLN
INTRAMUSCULAR | Status: AC
  Filled 2019-06-12: qty 10

## 2019-06-12 MED ORDER — ACETAMINOPHEN 325 MG PO TABS: 650.0000 mg | ORAL_TABLET | Freq: Four times a day (QID) | ORAL | Status: DC | PRN

## 2019-06-12 MED ORDER — FUROSEMIDE 10 MG/ML IJ SOLN
20.0000 mg | Freq: Every day | INTRAMUSCULAR | Status: DC
  Administered 2019-06-13 – 2019-06-14 (×2): 20 mg via INTRAVENOUS
  Filled 2019-06-12 (×2): qty 2

## 2019-06-12 MED ORDER — HYDROMORPHONE HCL 1 MG/ML IJ SOLN
1.0000 mg | Freq: Once | INTRAMUSCULAR | Status: AC
  Administered 2019-06-12: 1 mg via INTRAVENOUS
  Filled 2019-06-12: qty 1

## 2019-06-12 MED ORDER — GABAPENTIN 100 MG PO CAPS: 100.0000 mg | ORAL_CAPSULE | Freq: Three times a day (TID) | ORAL | Status: DC | PRN

## 2019-06-12 MED ORDER — CALCIUM CARBONATE-VITAMIN D 500-200 MG-UNIT PO TABS
1.0000 | ORAL_TABLET | Freq: Every day | ORAL | Status: DC
  Administered 2019-06-13 – 2019-06-16 (×3): 1 via ORAL
  Filled 2019-06-12 (×4): qty 1

## 2019-06-12 MED ORDER — NYSTATIN 100000 UNIT/GM EX POWD
Freq: Three times a day (TID) | CUTANEOUS | Status: DC
  Administered 2019-06-12 – 2019-06-16 (×11): via TOPICAL
  Filled 2019-06-12 (×3): qty 15

## 2019-06-12 MED ORDER — OXYCODONE-ACETAMINOPHEN 5-325 MG PO TABS
1.0000 | ORAL_TABLET | Freq: Four times a day (QID) | ORAL | Status: DC | PRN
  Administered 2019-06-12 – 2019-06-16 (×13): 1 via ORAL
  Filled 2019-06-12 (×13): qty 1

## 2019-06-12 MED ORDER — METHOCARBAMOL 1000 MG/10ML IJ SOLN
1000.0000 mg | Freq: Once | INTRAVENOUS | Status: AC
  Administered 2019-06-12: 1000 mg via INTRAVENOUS
  Filled 2019-06-12: qty 10

## 2019-06-12 MED ORDER — POLYETHYLENE GLYCOL 3350 17 G PO PACK
17.0000 g | PACK | Freq: Every day | ORAL | Status: DC | PRN
  Administered 2019-06-13: 17 g via ORAL
  Filled 2019-06-12: qty 1

## 2019-06-12 MED ORDER — FUROSEMIDE 10 MG/ML IJ SOLN
20.0000 mg | Freq: Once | INTRAMUSCULAR | Status: AC
  Administered 2019-06-12: 20 mg via INTRAVENOUS
  Filled 2019-06-12: qty 2

## 2019-06-12 MED ORDER — PREDNISONE 5 MG PO TABS
7.5000 mg | ORAL_TABLET | Freq: Every day | ORAL | Status: DC
  Administered 2019-06-13 – 2019-06-16 (×4): 7.5 mg via ORAL
  Filled 2019-06-12 (×4): qty 2

## 2019-06-12 MED ORDER — FLUTICASONE PROPIONATE 50 MCG/ACT NA SUSP
1.0000 | Freq: Two times a day (BID) | NASAL | Status: DC
  Administered 2019-06-12 – 2019-06-16 (×8): 1 via NASAL
  Filled 2019-06-12: qty 16

## 2019-06-12 MED ORDER — BALSALAZIDE DISODIUM 750 MG PO CAPS
2250.0000 mg | ORAL_CAPSULE | Freq: Three times a day (TID) | ORAL | Status: DC
  Administered 2019-06-13 – 2019-06-14 (×4): 2250 mg via ORAL

## 2019-06-12 NOTE — ED Provider Notes (Signed)
  Physical Exam  BP (!) 141/107   Pulse (!) 135   Temp 97.8 F (36.6 C) (Oral)   Resp 17   Ht 5' (1.524 m)   Wt 69.4 kg   SpO2 98%   BMI 29.88 kg/m   Physical Exam   Gen: appears uncomfortable due to pain [id number] between 140 and 150 Skin: skin irritation at L groin intertriginous region c/w intertrigo   ED Course/Procedures     Procedures  MDM   Patient signed out to me by H. Marshell Levan, PA-C.  Please see previous notes for further history.  In brief, patient presenting for evaluation of pain.  Having severe back pain making ADLs difficult.  Patient also having left-sided abdominal pain.  Work-up in the ER showed for new compression fracture, as well as progression of multiple previously demonstrated compression fractures.  White count of 19, although patient with a history of leukocytosis.  BNP slightly elevated at 320.  X-ray shows trace effusion, no pneumonia.  Consider elevated white count due to pain.  Patient received 2 rounds of fentanyl and limited Dilaudid.  Plan to recheck.  If heart rate and pain improved, patient to be discharged.  On reassessment, patient's heart rate has worsened, 1 40-1 50.  Patient reports no improvement of pain.  Patient states she is feeling slightly short of breath.  She reports she is no longer taking her Lasix, as she was told not to. She reports worsening peripheral edema.  I am concerned about patient's continued pain and elevated heart rate.  Consider intractable pain versus CHF exacerbation or component of both.  As such, I do not believe she can be discharged safely.  Will call for admission.  Patient is agreeable to plan.  Discussed with Dr. Waldron Labs from [facility], pt to be admitted.         Franchot Heidelberg, PA-C 06/12/19 1930    Daleen Bo, MD 06/15/19 [phone]
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
## 2019-06-12 NOTE — H&P (Addendum)
TRH H&P   Patient Demographics:    Betty Bryan, is a 63 y.o. female  MRN: [id number]   DOB - 28-Mar-1956  Admit Date - 06/12/2019  Outpatient Primary MD for the patient is Crist Infante, MD  Referring MD/NP/PA: PA Sophia  Patient coming from: Home  Chief Complaint  Patient presents with   Abdominal Pain      HPI:    Betty Bryan  is a 63 y.o. female,  with history of adrenal insufficiency on chronic prednisone, A. fib on Eliquis, hypertension, colitis, multiple recent admissions secondary to sepsis from pneumonia, and cellulitis, patient with multiple recent hospitalizations, 4 admissions since April of this year, 2 of them within 1 month, patient presents to ED today secondary to complaints of back pain, left flank pain, patient on prednisone chronically for adrenal insufficiency and ulcerative colitis, report this has been managed by endocrinologist in [place], patient presents to complaints of back pain/left flank pain for few days, not controlled by her home dose oxycodone, which prompted her to come to ED, as well she does report some mild dyspnea, her Lasix was stopped by her PCP recently as well, she denies fever, chills, chest pain, cough, coffee-ground emesis, or falls. - in ED x-ray significant for mild bilateral effusion, lower extremity edema, CT abdomen pelvis significant mainly for multiple compressive vertebral fracture, a new and progressive, as well patient received IV Lasix, after that she started to complains of suprapubic pain, she had urinary retention where she became tachycardic in the 140s, which she required Foley catheter insertion as well.    Review of systems:    In addition to the HPI above, No Fever-chills, No Headache, No changes with Vision or hearing, No problems swallowing food or Liquids, She does report some dyspnea, denies any chest pain No  Abdominal pain, No Nausea or Vommitting, Bowel movements are regular, No Blood in stool or Urine, No dysuria, No new skin rashes or bruises, She complains of back pain, flank pain No new weakness, tingling, numbness in any extremity, No recent weight gain or loss, No polyuria, polydypsia or polyphagia, No significant Mental Stressors.  A full 10 point Review of Systems was done, except as stated above, all other Review of Systems were negative.   With Past History of the following :    Past Medical History:  Diagnosis Date   Adrenal insufficiency (HCC)    HTN (hypertension)    L4 vertebral fracture (HCC)    L4-L5 fracture   Palpitations    Paroxysmal A-fib (HCC)    PVC's (premature ventricular contractions)    Ulcerative colitis       Past Surgical History:  Procedure Laterality Date   HAND SURGERY     NASAL SINUS SURGERY        Social History:     Social History   Tobacco Use   Smoking status: Never Smoker  Smokeless tobacco: Never Used  Substance Use Topics   Alcohol use: No    Alcohol/week: 0.0 standard drinks       Family History :     Family History  Problem Relation Age of Onset   Heart failure Other    Diabetes Father    Heart disease Father    Hypertension Father      Home Medications:   Prior to Admission medications   Medication Sig Start Date End Date Taking? Authorizing Provider  atenolol (TENORMIN) 25 MG tablet Take 1 tablet (25 mg total) by mouth every 8 (eight) hours. 05/23/19  Yes Aline August, MD  balsalazide (COLAZAL) 750 MG capsule Take 2,250 mg by mouth 3 (three) times daily.   Yes [provider]  calcium-vitamin D (OSCAL WITH D) 500-200 MG-UNIT tablet Take 1 tablet by mouth daily. 05/23/19  Yes Aline August, MD  ELIQUIS 2.5 MG TABS tablet Take 2.5 mg by mouth 2 (two) times daily.  04/28/18  Yes [provider]  fluticasone (FLONASE) 50 MCG/ACT nasal spray Place 1 spray into both nostrils 2  (two) times daily.    Yes [provider]  gabapentin (NEURONTIN) 100 MG capsule Take 100-300 mg by mouth 3 (three) times daily as needed (for nerve pain).    Yes [provider]  HYDROCORTISONE ACE, RECTAL, 30 MG SUPP Place 1 suppository rectally at bedtime.  06/08/15  Yes [provider]  loratadine (CLARITIN) 10 MG tablet Take 10 mg by mouth daily.   Yes [provider]  nystatin (MYCOSTATIN/NYSTOP) powder Apply 1 application topically 3 (three) times daily.  05/08/18  Yes [provider]  ondansetron (ZOFRAN) 4 MG tablet Take 1 tablet (4 mg total) by mouth every 6 (six) hours as needed for nausea. 05/23/19  Yes Aline August, MD  oxyCODONE-acetaminophen (PERCOCET/ROXICET) 5-325 MG tablet Take 1-2 tablets by mouth every 6 (six) hours as needed for moderate pain or severe pain. 12/31/18  Yes Shah, Pratik D, DO  pantoprazole (PROTONIX) 20 MG tablet Take 20 mg by mouth daily.   Yes [provider]  predniSONE (DELTASONE) 5 MG tablet Take 7.5-8 mg by mouth daily. As directed by your physician 05/05/19  Yes [provider]  collagenase (SANTYL) ointment Apply topically daily. Apply to left leg ulcers daily. Patient not taking: Reported on 06/12/2019 05/24/19   Aline August, MD  diazepam (VALIUM) 5 MG tablet Take 0.5 tablets (2.5 mg total) by mouth 2 (two) times daily as needed for anxiety. Patient not taking: Reported on 06/12/2019 05/23/19   Aline August, MD  docusate sodium (COLACE) 100 MG capsule Take 1 capsule (100 mg total) by mouth 2 (two) times daily. Patient not taking: Reported on 06/12/2019 05/23/19   Aline August, MD  furosemide (LASIX) 20 MG tablet Take 1 tablet (20 mg total) by mouth daily. Patient not taking: Reported on 06/12/2019 05/23/19   Aline August, MD  loperamide (IMODIUM) 2 MG capsule Take 1 capsule (2 mg total) by mouth every 6 (six) hours as needed for diarrhea or loose stools. Patient not taking: Reported on 06/12/2019  05/24/19   Aline August, MD  polyethylene glycol (MIRALAX / GLYCOLAX) 17 g packet Take 17 g by mouth daily as needed for mild constipation. Patient not taking: Reported on 06/12/2019 05/23/19   Aline August, MD     Allergies:     Allergies  Allergen Reactions   Penicillins Anaphylaxis    Has patient had a PCN reaction causing immediate rash, facial/tongue/throat swelling,  SOB or lightheadedness with hypotension: Yes Has patient had a PCN reaction causing severe rash involving mucus membranes or skin necrosis: Yes Has patient had a PCN reaction that required hospitalization: No Has patient had a PCN reaction occurring within the last 10 years: No If all of the above answers are "NO", then may proceed with Cephalosporin use.    Sulfa Antibiotics Anaphylaxis   Morphine And Related Nausea And Vomiting   Voltaren [Diclofenac]     Chest pains - Voltaren Gel      Physical Exam:   Vitals  Blood pressure (!) 141/107, pulse (!) 135, temperature 97.8 F (36.6 C), temperature source Oral, resp. rate 17, height 5' (1.524 m), weight 69.4 kg, SpO2 98 %.   1. General ill-appearing female, laying in bed and significant discomfort secondary to urinary retention, patient had typical cushingoid features with moon facies, significant wasting in peripheral extremities and central obesity  2. Normal affect and insight, Not Suicidal or Homicidal, Awake Alert, Oriented X 3.  3. No F.N deficits, ALL C.Nerves Intact, Strength 5/5 all 4 extremities, Sensation intact all 4 extremities, Plantars down going.  4. Ears and Eyes appear Normal, Conjunctivae clear, PERRLA. Moist Oral Mucosa.  5. Supple Neck, No JVD, No cervical lymphadenopathy appriciated, No Carotid Bruits.  6. Symmetrical Chest wall movement, Good air movement bilaterally, bibasilar crackles.  7.  Significantly tachycardic, no Gallops, Rubs or Murmurs, No Parasternal Heave.  8. Positive Bowel Sounds,  suprapubic fullness and  tenderness to palpation ,No rebound -guarding or rigidity.  9.  No Cyanosis, Normal Skin Turgor, patient had skin tear in left upper extremity, and lower extremity wound bandaged.  10. Good muscle tone,  joints appear normal , has +1 bilateral lower extremity edema  11. No Palpable Lymph Nodes in Neck or Axillae    Data Review:    CBC Recent Labs  Lab 06/12/19 1000  WBC 19.0*  HGB 12.4  HCT 41.7  PLT 195  MCV 99.5  MCH 29.6  MCHC 29.7*  RDW 16.1*  LYMPHSABS 0.7  MONOABS 1.6*  EOSABS 0.1  BASOSABS 0.1   ------------------------------------------------------------------------------------------------------------------  Chemistries  Recent Labs  Lab 06/12/19 1000  NA 142  K 3.9  CL 105  CO2 28  GLUCOSE 82  BUN 7*  CREATININE 0.40*  CALCIUM 8.5*  AST 28  ALT 29  ALKPHOS 134*  BILITOT 0.8   ------------------------------------------------------------------------------------------------------------------ estimated creatinine clearance is 63.4 mL/min (A) (by C-G formula based on SCr of 0.4 mg/dL (L)). ------------------------------------------------------------------------------------------------------------------ No results for input(s): TSH, T4TOTAL, T3FREE, THYROIDAB in the last 72 hours.  Invalid input(s): FREET3  Coagulation profile No results for input(s): INR, PROTIME in the last 168 hours. ------------------------------------------------------------------------------------------------------------------- No results for input(s): DDIMER in the last 72 hours. -------------------------------------------------------------------------------------------------------------------  Cardiac Enzymes No results for input(s): CKMB, TROPONINI, MYOGLOBIN in the last 168 hours.  Invalid input(s): CK ------------------------------------------------------------------------------------------------------------------    Component Value Date/Time   BNP 321.0 (H) 06/12/2019  0939     ---------------------------------------------------------------------------------------------------------------  Urinalysis    Component Value Date/Time   COLORURINE STRAW (A) 06/12/2019 1034   APPEARANCEUR CLEAR 06/12/2019 1034   LABSPEC 1.005 06/12/2019 1034   PHURINE 8.0 06/12/2019 [place] 06/12/2019 1034   HGBUR NEGATIVE 06/12/2019 [place] 06/12/2019 [place] 06/12/2019 1034   PROTEINUR NEGATIVE 06/12/2019 1034   NITRITE NEGATIVE 06/12/2019 1034   LEUKOCYTESUR NEGATIVE 06/12/2019 1034    ----------------------------------------------------------------------------------------------------------------   Imaging Results:    Ct Abdomen Pelvis  W Contrast  Result Date: 06/12/2019 CLINICAL DATA:  Flank pain, abdominal pain EXAM: CT ABDOMEN AND PELVIS WITH CONTRAST TECHNIQUE: Multidetector CT imaging of the abdomen and pelvis was performed using the standard protocol following bolus administration of intravenous contrast. CONTRAST:  129mL OMNIPAQUE IOHEXOL 300 MG/ML  SOLN COMPARISON:  11/25/2018 FINDINGS: Lower chest: Trace bilateral pleural effusions with associated bibasilar atelectasis. Hepatobiliary: Probable focal fatty infiltration along the falciform ligament. Liver otherwise unremarkable. Unremarkable gallbladder. No intrahepatic biliary dilatation. Pancreas: Unremarkable. No pancreatic ductal dilatation or surrounding inflammatory changes. Spleen: Normal in size without focal abnormality. Adrenals/Urinary Tract: Adrenal glands are unremarkable. Kidneys are normal, without renal calculi, focal lesion, or hydronephrosis. Bladder is unremarkable. Stomach/Bowel: Stomach and bowel within normal limits. No dilated loops of bowel. No focal bowel wall thickening or inflammatory changes. Vascular/Lymphatic: No significant vascular findings are present. No enlarged abdominal or pelvic lymph nodes. Reproductive: Multiple partially  calcified uterine fibroids. No adnexal masses. Other: Rectus diastasis. Small fat containing umbilical hernia. Fluid is noted accumulating along the dependent portion of the left anterior abdominal wall which is displaced to the left. No ascites. Musculoskeletal: There are extensive compression fractures throughout the thoracic and lumbar spine, many of which are chronic, some of which are new or progressed from prior CTs 11/25/2018 and 05/10/2019. There also numerous bilateral subacute to chronic healing rib fractures. Healing right superior and inferior pubic rami fractures. *T4: New superior endplate compression fracture involving approximately 25% vertebral body height loss. *T6: Unchanged advanced compression deformity involving greater than 50% height loss. *T8: Mild superior endplate compression involving less than 25% vertebral body height loss which appears minimally progressed from prior. *T9: Unchanged severe compression fracture. *T11: Unchanged mild superior endplate compression fracture of approximately 25% height loss. *T12: Unchanged central superior endplate compression deformity. *L1: Unchanged superior endplate compression fracture with approximately 50% height loss. *L2: Acute superior endplate compression fracture with approximately 60% vertebral body height loss. *L3: Acute superior endplate compression fracture with approximately 50% vertebral body height loss. *L4: Acute progression of compression fracture now with approximately 75% height loss. *L5: Chronic compression fracture with approximately 50% height loss. IMPRESSION: 1. Multiple new and progressive vertebral body compression fractures throughout the thoracic and lumbar spine including T4, T8, L2, L3, and L4. 2. Trace bilateral pleural effusions with associated bibasilar atelectasis. 3. Fluid accumulation within the superficial soft tissues overlying the left anterior abdominal wall, nonspecific. Electronically Signed   By: Davina Poke M.D.   On: 06/12/2019 15:29   Dg Chest Portable 1 View  Result Date: 06/12/2019 CLINICAL DATA:  Left-sided chest and abdominal pain, history of pneumonia EXAM: PORTABLE CHEST 1 VIEW COMPARISON:  05/20/2019 FINDINGS: Lordotic, low volume AP portable examination. No evident interval change in a probable left pleural effusion with associated atelectasis or consolidation. Pulmonary vascular prominence and diffuse interstitial opacity, likely edema. No new or focal airspace opacity. The cardiac borders are predominantly obscured. IMPRESSION: Lordotic, low volume AP portable examination. No evident interval change in a probable left pleural effusion with associated atelectasis or consolidation. Pulmonary vascular prominence and diffuse interstitial opacity, likely edema. No new or focal airspace opacity. Electronically Signed   By: Eddie Candle M.D.   On: 06/12/2019 10:36    My personal review of EKG: Rhythm NSR, Rate  85 /min, QTc 423 , no Acute ST changes   Assessment & Plan:    Active Problems:   Chronic venous insufficiency   Chronic respiratory failure with hypoxia (HCC)   AF (paroxysmal atrial  fibrillation) ([place])   Essential hypertension   History of DVT (deep vein thrombosis)   Pleural effusion, left   Acute on chronic respiratory failure with hypoxia (HCC)   Adrenal insufficiency (HCC)   Vertebral fracture, osteoporotic (HCC)   Uncontrolled pain   Multiple compression vertebral fracture/uncontrolled pain -Patient with back pain secondary to multiple new and progressive vertebral body compression fractures throughout the thoracic and lumbar spine including T4, T8, L2, L3, and L4. -This is most likely in the setting of chronic use of prednisone, and osteoporosis, he remains in significant pain in ED, where she required IV fentanyl x2, and IV Dilaudid, still with significant pain, she will be admitted for pain control, will keep on IV Dilaudid 0.5 mg IV every 4 hours, and PRN  oxycodone, will keep on continuous pulse monitoring and CO2 monitoring as well. -We will consult PT, continue with vitamin D and calcium supplement.  Chronic adrenal insufficiency -This has been managed by prednisone, patient had typical Cushing syndrome features, discussed with husband to follow-up with her primary endocrinologist in [place] see if prednisone can be changed.  Bilateral pleural effusion -Her Lasix has been stopped as an outpatient, she is with evidence of volume overload given lower extremity edema, and pleural effusion on imaging, will start on IV Lasix.  Urinary retention -Patient extremely uncomfortable while in ED, bladder scan showed urinary retention, will insert  Foley catheter.  Leukocytosis -Chronic,  she is afebrile, nontoxic-appearing, this is related to chronic steroids  Sinus tachycardia -She is in normal sinus rhythm heart rate controlled on presentation, but currently with sinus tachycardia heart rate in the 140s, this is secondary to her discomfort from urinary retention, will insert Foley catheter and resume atenolol.  Skin wounds -Patient with right upper extremity skin tear, and left lower extremity skin wound, will consult wound care.  History of A. Fib -Currently sinus rhythm, she is sinus tachycardic not in A. fib, continue with atenolol and apixaban  Hypertension -Continue with home medication  DVT Prophylaxis on Eliquis  AM Labs Ordered, also please review Full Orders  Family Communication: Admission, patients condition and plan of care including tests being ordered have been discussed with the patient and Husbandwho indicate understanding and agree with the plan and Code Status.  Code Status DNR, confirmed by patient and husband  Likely DC to  Home  Condition GUARDED   Consults called: None  Admission status: Observation  Time spent in minutes : 60 minutes   Phillips Climes M.D on 06/12/2019 at 7:18 PM  Between 7am to 7pm -  Pager - [phone]. After 7pm go to [email or web address] - password [email or web address]  [facility] - Office  [phone]

## 2019-06-12 NOTE — ED Notes (Signed)
Lab drawing blood work. Will start IV and give meds once done.

## 2019-06-12 NOTE — Discharge Instructions (Signed)
Your CT scan suggests multiple compression fractures of both your thoracic and your lumbar vertebra.  Please continue to use heat to your back.  Practice taking deep breaths frequently.  Continue your current medications.  Please discuss these new findings with Dr.Parini.  Please monitor your temperature closely..  Return to the emergency department if any changes in your condition, problems, or concerns.

## 2019-06-12 NOTE — ED Triage Notes (Signed)
Pt brought in by ems for multiple complaints. Main complaint is left sided abd pain, but pt states she is having a hard time managing at home due to decompensation from [facility] in January. Multiple hospital visits and a rehab stay.

## 2019-06-13 ENCOUNTER — Other Ambulatory Visit: Payer: Self-pay

## 2019-06-13 DIAGNOSIS — J9611 Chronic respiratory failure with hypoxia: Secondary | ICD-10-CM | POA: Diagnosis not present

## 2019-06-13 DIAGNOSIS — I872 Venous insufficiency (chronic) (peripheral): Secondary | ICD-10-CM | POA: Diagnosis not present

## 2019-06-13 DIAGNOSIS — S22000A Wedge compression fracture of unspecified thoracic vertebra, initial encounter for closed fracture: Secondary | ICD-10-CM | POA: Diagnosis not present

## 2019-06-13 DIAGNOSIS — E2740 Unspecified adrenocortical insufficiency: Secondary | ICD-10-CM | POA: Diagnosis not present

## 2019-06-13 DIAGNOSIS — I10 Essential (primary) hypertension: Secondary | ICD-10-CM

## 2019-06-13 DIAGNOSIS — S32000A Wedge compression fracture of unspecified lumbar vertebra, initial encounter for closed fracture: Secondary | ICD-10-CM

## 2019-06-13 LAB — BASIC METABOLIC PANEL
Anion gap: 15 (ref 5–15)
BUN: 7 mg/dL — ABNORMAL LOW (ref 8–23)
CO2: 27 mmol/L (ref 22–32)
Calcium: 8.2 mg/dL — ABNORMAL LOW (ref 8.9–10.3)
Chloride: 96 mmol/L — ABNORMAL LOW (ref 98–111)
Creatinine, Ser: 0.54 mg/dL (ref 0.44–1.00)
GFR calc Af Amer: >60 mL/min (ref >60)
GFR calc non Af Amer: >60 mL/min (ref >60)
Glucose, Bld: 88 mg/dL (ref 70–99)
Potassium: 3 mmol/L — ABNORMAL LOW (ref 3.5–5.1)
Sodium: 138 mmol/L (ref 135–145)

## 2019-06-13 LAB — CBC
HCT: 41.1 % (ref 36.0–46.0)
Hemoglobin: 12.6 g/dL (ref 12.0–15.0)
MCH: 29.8 pg (ref 26.0–34.0)
MCHC: 30.7 g/dL (ref 30.0–36.0)
MCV: 97.2 fL (ref 80.0–100.0)
Platelets: 214 10*3/uL (ref 150–400)
RBC: 4.23 MIL/uL (ref 3.87–5.11)
RDW: 16.3 % — ABNORMAL HIGH (ref 11.5–15.5)
WBC: 20.3 10*3/uL — ABNORMAL HIGH (ref 4.0–10.5)
nRBC: 0.2 % (ref 0.0–0.2)

## 2019-06-13 LAB — SARS CORONAVIRUS 2 (TAT 6-24 HRS): SARS Coronavirus 2: NEGATIVE

## 2019-06-13 MED ORDER — PANTOPRAZOLE SODIUM 40 MG PO TBEC
40.0000 mg | DELAYED_RELEASE_TABLET | Freq: Every day | ORAL | Status: DC
  Administered 2019-06-13 – 2019-06-16 (×4): 40 mg via ORAL
  Filled 2019-06-13 (×4): qty 1

## 2019-06-13 NOTE — Plan of Care (Signed)
  Problem: Education: Goal: Knowledge of General Education information will improve Description: Including pain rating scale, medication(s)/side effects and non-pharmacologic comfort measures Outcome: Not Progressing   Problem: Health Behavior/Discharge Planning: Goal: Ability to manage health-related needs will improve Outcome: Not Progressing   Problem: Clinical Measurements: Goal: Will remain free from infection Outcome: Not Progressing   Problem: Clinical Measurements: Goal: Respiratory complications will improve Outcome: Not Progressing

## 2019-06-13 NOTE — Progress Notes (Signed)
PT. C/O NAUSEA, DR. MADERA NOTIFIED OF PT'S COMPAINTS, S/O REMAINS AT THE BEDSIDE.

## 2019-06-13 NOTE — Progress Notes (Signed)
PT OOB IN CHAIR, REQUESTING STAFF TO ASSIST HER BACK TO BED, HAS BEEN UP IN CHAIR FOR APPROX. 20 MIN, STATES THAT THE PHYSICAL THERAPIST REPORTED THAT SHE WOULD RETURN AT 1430 TO RETURN HER TO BED, PT STATES SHE CAN'T TOLERATE SITTING UP ANY LONGER.

## 2019-06-13 NOTE — Progress Notes (Signed)
PT CONTINUES TO C/O CONSTIPATION & BACK PAIN, ICE PACKS APPLIED TO LEFT LOWER BACK AS PER PT'S REQUEST, S/O AT THE BEDSIDE.

## 2019-06-13 NOTE — Progress Notes (Signed)
PROGRESS NOTE    Betty Bryan  [name] DOB: 09/06/1956 DOA: 06/12/2019 PCP: Crist Infante, MD     Brief Narrative:  As per H&P written by Dr. Waldron Labs on 06/12/19 63 y.o. female, with history of adrenal insufficiency on chronic prednisone, A. fib on Eliquis, hypertension, colitis, multiple recent admissions secondary to sepsis from pneumonia, and cellulitis, patient with multiple recent hospitalizations, 4 admissions since April of this year, 2 of them within 1 month, patient presents to ED today secondary to complaints of back pain, left flank pain, patient on prednisone chronically for adrenal insufficiency and ulcerative colitis, report this has been managed by endocrinologist in [place], patient presents to complaints of back pain/left flank pain for few days, not controlled by her home dose oxycodone, which prompted her to come to ED, as well she does report some mild dyspnea, her Lasix was stopped by her PCP recently as well, she denies fever, chills, chest pain, cough, coffee-ground emesis, or falls. - in ED x-ray significant for mild bilateral effusion, lower extremity edema, CT abdomen pelvis significant mainly for multiple compressive vertebral fracture, a new and progressive, as well patient received IV Lasix, after that she started to complains of suprapubic pain, she had urinary retention where she became tachycardic in the 140s, which she required Foley catheter insertion as well.  Assessment & Plan: 1-multiple compression vertebral fracture and uncontrolled pain -Continue PRN analgesics -Palliative care service has been consulted for advanced directives and assisting with pain management -At this moment patient is interested in to receiving more information from hospice and is open to receive appropriate home DME's to keep herself comfortable. -Continue PRN oxycodone for breakthrough and apply cold compresses for physical measures. -Continue vitamin D and calcium  supplement; follow physical therapy evaluation.  2-chronic adrenal insufficiency -Continue prednisone and hydrocortisone. -Actively seen by endocrinologist as an outpatient. -Stable vital signs.  3-bilateral pleural effusion and shortness of breath -Patient radiologic images demonstrating vascular congestion -Continue Lasix and follow renal function/electrolytes trend. -Heart healthy diet recommended.  4-urinary retention -Foley catheter in place with good urine output after. -Follow clinical response and attempt voiding trial on 06/14/19.  5-leukocytosis -Appears to be chronic -Patient is afebrile and no clear source of infection identified -Continue holding on antibiotics -Follow WBCs trend.  6-history of paroxysmal atrial fibrillation -Currently with sinus tachycardia -Continue the use of atenolol and apixaban -Continue monitoring electrolytes.  7-essential hypertension -Currently stable and well-controlled. -Continue to follow vital signs.   Active Problems:   Chronic venous insufficiency   Chronic respiratory failure with hypoxia (HCC)   AF (paroxysmal atrial fibrillation) (HCC)   Essential hypertension   History of DVT (deep vein thrombosis)   Pleural effusion, left   Acute on chronic respiratory failure with hypoxia (HCC)   Adrenal insufficiency (HCC)   Vertebral fracture, osteoporotic (HCC)   Uncontrolled pain      DVT prophylaxis: Eliquis. Code Status: DNR Family Communication: No family at bedside. Disposition Plan: Remains in the hospital, continue supportive care and analgesics.  Palliative care has been consulted after partially discussing with patient's about goals of cares and advance directive.    Consultants:   Palliative care  Procedures:   See below for x-ray reports.  Antimicrobials:  Anti-infectives (From admission, onward)   None       Subjective: No fever, no chest pain, no nausea, no vomiting.  Still complaining of severe  back pain.  Patient is frail weak and severely deconditioned.  Objective: Vitals:   06/12/19 2017  06/12/19 2150 06/12/19 2258 06/13/19 0510  BP:   (!) 145/105 124/77  Pulse: (!) 124 (!) 116 (!) 110 95  Resp:  16 16 16   Temp:   97.7 F (36.5 C) 98.2 F (36.8 C)  TempSrc:   Oral Oral  SpO2: 93% 96% 95% 93%  Weight:      Height:        Intake/Output Summary (Last 24 hours) at 06/13/2019 1004 Last data filed at 06/12/2019 2245 Gross per 24 hour  Intake 25.17 ml  Output 775 ml  Net -749.83 ml   Filed Weights   06/12/19 0840  Weight: 69.4 kg    Examination: General exam: Alert, awake, oriented x 3, reports still having significant pain in her back, no nausea, no vomiting, no chest pain. Respiratory system: Fair air movement, normal respiratory effort, no using accessory muscles. Cardiovascular system: Mild tachycardia. No murmurs, rubs, gallops. Gastrointestinal system: Abdomen is nondistended, soft and nontender. No organomegaly or masses felt. Normal bowel sounds heard. Central nervous system: Alert and oriented. No focal neurological deficits. Extremities: No C/C/E, +pedal pulses Skin: Multiple skin injuries and venous ulcers appreciated in the back of her legs and sacral area.  Lesions present prior to admission.  Positive skin folds and under breast fungal infection. Psychiatry: Mood & affect appropriate.     Data Reviewed: I have personally reviewed following labs and imaging studies  CBC: Recent Labs  Lab 06/12/19 1000 06/13/19 0624  WBC 19.0* 20.3*  NEUTROABS 15.7*  --   HGB 12.4 12.6  HCT 41.7 41.1  MCV 99.5 97.2  PLT 195 [id number]   Basic Metabolic Panel: Recent Labs  Lab 06/12/19 1000 06/13/19 0624  NA 142 138  K 3.9 3.0*  CL 105 96*  CO2 28 27  GLUCOSE 82 88  BUN 7* 7*  CREATININE 0.40* 0.54  CALCIUM 8.5* 8.2*   GFR: Estimated Creatinine Clearance: 63.4 mL/min (by C-G formula based on SCr of 0.54 mg/dL).   Liver Function Tests: Recent Labs  Lab  06/12/19 1000  AST 28  ALT 29  ALKPHOS 134*  BILITOT 0.8  PROT 5.2*  ALBUMIN 2.8*   Recent Labs  Lab 06/12/19 1000  LIPASE 59*   Urine analysis:    Component Value Date/Time   COLORURINE STRAW (A) 06/12/2019 1920   APPEARANCEUR HAZY (A) 06/12/2019 1920   LABSPEC 1.020 06/12/2019 1920   PHURINE 7.0 06/12/2019 1920   GLUCOSEU NEGATIVE 06/12/2019 1920   HGBUR SMALL (A) 06/12/2019 1920   BILIRUBINUR NEGATIVE 06/12/2019 1920   KETONESUR NEGATIVE 06/12/2019 1920   PROTEINUR NEGATIVE 06/12/2019 1920   NITRITE NEGATIVE 06/12/2019 1920   LEUKOCYTESUR NEGATIVE 06/12/2019 1920    Radiology Studies: Ct Abdomen Pelvis W Contrast  Result Date: 06/12/2019 CLINICAL DATA:  Flank pain, abdominal pain EXAM: CT ABDOMEN AND PELVIS WITH CONTRAST TECHNIQUE: Multidetector CT imaging of the abdomen and pelvis was performed using the standard protocol following bolus administration of intravenous contrast. CONTRAST:  140mL OMNIPAQUE IOHEXOL 300 MG/ML  SOLN COMPARISON:  11/25/2018 FINDINGS: Lower chest: Trace bilateral pleural effusions with associated bibasilar atelectasis. Hepatobiliary: Probable focal fatty infiltration along the falciform ligament. Liver otherwise unremarkable. Unremarkable gallbladder. No intrahepatic biliary dilatation. Pancreas: Unremarkable. No pancreatic ductal dilatation or surrounding inflammatory changes. Spleen: Normal in size without focal abnormality. Adrenals/Urinary Tract: Adrenal glands are unremarkable. Kidneys are normal, without renal calculi, focal lesion, or hydronephrosis. Bladder is unremarkable. Stomach/Bowel: Stomach and bowel within normal limits. No dilated loops of bowel. No focal bowel wall thickening  or inflammatory changes. Vascular/Lymphatic: No significant vascular findings are present. No enlarged abdominal or pelvic lymph nodes. Reproductive: Multiple partially calcified uterine fibroids. No adnexal masses. Other: Rectus diastasis. Small fat containing  umbilical hernia. Fluid is noted accumulating along the dependent portion of the left anterior abdominal wall which is displaced to the left. No ascites. Musculoskeletal: There are extensive compression fractures throughout the thoracic and lumbar spine, many of which are chronic, some of which are new or progressed from prior CTs 11/25/2018 and 05/10/2019. There also numerous bilateral subacute to chronic healing rib fractures. Healing right superior and inferior pubic rami fractures. *T4: New superior endplate compression fracture involving approximately 25% vertebral body height loss. *T6: Unchanged advanced compression deformity involving greater than 50% height loss. *T8: Mild superior endplate compression involving less than 25% vertebral body height loss which appears minimally progressed from prior. *T9: Unchanged severe compression fracture. *T11: Unchanged mild superior endplate compression fracture of approximately 25% height loss. *T12: Unchanged central superior endplate compression deformity. *L1: Unchanged superior endplate compression fracture with approximately 50% height loss. *L2: Acute superior endplate compression fracture with approximately 60% vertebral body height loss. *L3: Acute superior endplate compression fracture with approximately 50% vertebral body height loss. *L4: Acute progression of compression fracture now with approximately 75% height loss. *L5: Chronic compression fracture with approximately 50% height loss. IMPRESSION: 1. Multiple new and progressive vertebral body compression fractures throughout the thoracic and lumbar spine including T4, T8, L2, L3, and L4. 2. Trace bilateral pleural effusions with associated bibasilar atelectasis. 3. Fluid accumulation within the superficial soft tissues overlying the left anterior abdominal wall, nonspecific. Electronically Signed   By: Davina Poke M.D.   On: 06/12/2019 15:29   Dg Chest Portable 1 View  Result Date:  06/12/2019 CLINICAL DATA:  Left-sided chest and abdominal pain, history of pneumonia EXAM: PORTABLE CHEST 1 VIEW COMPARISON:  05/20/2019 FINDINGS: Lordotic, low volume AP portable examination. No evident interval change in a probable left pleural effusion with associated atelectasis or consolidation. Pulmonary vascular prominence and diffuse interstitial opacity, likely edema. No new or focal airspace opacity. The cardiac borders are predominantly obscured. IMPRESSION: Lordotic, low volume AP portable examination. No evident interval change in a probable left pleural effusion with associated atelectasis or consolidation. Pulmonary vascular prominence and diffuse interstitial opacity, likely edema. No new or focal airspace opacity. Electronically Signed   By: Eddie Candle M.D.   On: 06/12/2019 10:36     Scheduled Meds:  apixaban  2.5 mg Oral BID   atenolol  25 mg Oral Q8H   balsalazide  2,250 mg Oral TID   calcium-vitamin D  1 tablet Oral Daily   docusate sodium  100 mg Oral BID   fluticasone  1 spray Each Nare BID   furosemide  20 mg Intravenous Daily   hydrocortisone  25 mg Rectal QHS   loratadine  10 mg Oral Daily   nystatin   Topical TID   nystatin cream   Topical BID   pantoprazole  40 mg Oral Daily   predniSONE  7.5 mg Oral Daily   Continuous Infusions:   LOS: 0 days    Time spent: 30 minutes.    Barton Dubois, MD [facility] Pager [phone]   06/13/2019, 10:04 AM

## 2019-06-13 NOTE — Evaluation (Addendum)
Physical Therapy Evaluation Patient Details Name: Betty Bryan MRN: [id number] DOB: 1956-07-19 Today's Date: 06/13/2019   History of Present Illness  Patient is a 63 year old female admitted 06/12/2019 with diagnosis of acute on chronic respiratory failure with hypoxia and complaints of left flank/buttock pain. PMH: adrenal insufficiency on chronic prednisone, A. fib on Eliquis, hypertension, colitis, multiple recent admissions secondary to sepsis from pneumonia, and cellulitis, patient with multiple recent hospitalizations, 4 admissions since April of this year, 2 of them within 1 month. Multiple compressive vertebral fractures, new and progressive.    Clinical Impression  Pt admitted with above diagnosis. Patient agreeable to participation in PT evaluation. Bari Mantis, in room throughout session. Patient anxious regarding movement and getting out of bed to chair but agreeable to attempt. Patient required HOB elevated for bed mobility with minimal assistance. Minimal assistance required for transfers and ambulation with patient holding onto therapist's neck/shoulders as patient not agreeable to using a RW due to fear of falling. Patient agreeable to sitting in recliner for 30 minutes before returning to bed. Pt currently with functional limitations due to the deficits listed below (see PT Problem List). Pt will benefit from skilled PT to increase their independence and safety with mobility to allow discharge to the venue listed below.    Therapist returned to room to assist patient back to bed per patient request. Transfer chair to bed with patient's hands around therapist's neck/shoulders with min/mod assist. Mod assist for legs when performing sit to supine. Patient in increased pain upon lying down and requested multiple attempts to be repositioned. Upon exit, patient had taken an oral pain medication and was resting eaiser. Bari Mantis, assist with boosting patient up in bed and reassuring  patient's comfort.      Follow Up Recommendations SNF;Home health PT;Supervision/Assistance - 24 hour    Equipment Recommendations  None recommended by PT    Recommendations for Other Services       Precautions / Restrictions        Mobility  Bed Mobility Overal bed mobility: Needs Assistance Bed Mobility: Rolling;Sidelying to Sit;Sit to Sidelying Rolling: Min guard Sidelying to sit: Min assist;HOB elevated     Sit to sidelying: Min assist;HOB elevated    Transfers Overall transfer level: Needs assistance Equipment used: 1 person hand held assist(arms around therapists neck; unwilling to use RW today.) Transfers: Sit to/from [facility] Sit to Stand: Min assist Stand pivot transfers: Min assist          Ambulation/Gait Ambulation/Gait assistance: Min assist Gait Distance (Feet): 4 Feet Assistive device: (arms around therapists neck; unwilling to use RW today.) Gait Pattern/deviations: Step-to pattern;Decreased step length - right;Decreased step length - left;Decreased stride length;Trunk flexed;Wide base of support Gait velocity: decreased   General Gait Details: slow, labored cadence, significant Dowager's hump, limited by anxiety.  Stairs            Wheelchair Mobility    Modified Rankin (Stroke Patients Only)       Balance Overall balance assessment: Needs assistance Sitting-balance support: Bilateral upper extremity supported;Feet supported Sitting balance-Leahy Scale: Fair     Standing balance support: Bilateral upper extremity supported;During functional activity Standing balance-Leahy Scale: Fair Standing balance comment: fair with arms around therapists neck/shoulders                             Pertinent Vitals/Pain Pain Assessment: 0-10 Pain Score: 8  Pain Location: left flank Pain  Intervention(s): Limited activity within patient's tolerance;Monitored during session    [place]  expects to be discharged to:: Private residence   Available Help at Discharge: Available 24 hours/day;Personal care attendant;Friend(s) Type of Home: House Home Access: [place]: One [employer]: Environmental consultant - 2 wheels;Electric scooter;Wheelchair - manual;Cane - single point;Bedside commode;Toilet riser;Grab bars - tub/shower;Grab bars - toilet;Shower seat;Hand held shower head      Prior Function Level of Independence: Needs assistance   Gait / Transfers Assistance Needed: assist from lift chair to W/C to bathroom, does not walk  ADL's / Homemaking Assistance Needed: 24 hr assist for all mobility        Hand Dominance   Dominant Hand: Right    Extremity/Trunk Assessment   Upper Extremity Assessment Upper Extremity Assessment: Generalized weakness    Lower Extremity Assessment Lower Extremity Assessment: Generalized weakness RLE Deficits / Details: pt with grossly 3/5 strength as she was able to assist with sliding legs in bed but had difficulty lifting heels off mattress LLE Deficits / Details: pt with grossly 3/5 strength as she was able to assist with sliding legs in bed but had difficulty lifting heels off mattress    Cervical / Trunk Assessment Cervical / Trunk Assessment: Kyphotic  Communication   Communication: No difficulties  Cognition Arousal/Alertness: Awake/alert Behavior During Therapy: WFL for tasks assessed/performed Overall Cognitive Status: Within Functional Limits for tasks assessed                                        General Comments      Exercises     Assessment/Plan    PT Assessment Patient needs continued PT services  PT Problem List Decreased strength;Decreased mobility;Decreased activity tolerance;Pain;Decreased balance;Decreased knowledge of use of DME;Decreased skin integrity       PT Treatment Interventions DME instruction;Therapeutic activities;Gait training;Therapeutic  exercise;Patient/family education;Balance training    PT Goals (Current goals can be found in the Care Plan section)  Acute Rehab PT Goals Patient Stated Goal: Return home with 24/7 care that was in place. PT Goal Formulation: With patient Time For Goal Achievement: 06/27/19 Potential to Achieve Goals: Fair    Frequency Min 3X/week   Barriers to discharge        Co-evaluation               AM-PAC PT "6 Clicks" Mobility  Outcome Measure Help needed turning from your back to your side while in a flat bed without using bedrails?: A Little Help needed moving from lying on your back to sitting on the side of a flat bed without using bedrails?: A Little Help needed moving to and from a bed to a chair (including a wheelchair)?: A Lot Help needed standing up from a chair using your arms (e.g., wheelchair or bedside chair)?: A Lot Help needed to walk in hospital room?: A Little Help needed climbing 3-5 steps with a railing? : A Lot 6 Click Score: 15    End of Session Equipment Utilized During Treatment: Gait belt Activity Tolerance: Patient tolerated treatment well;Treatment limited secondary to agitation Patient left: in chair;with call bell/phone within reach;with family/visitor present Nurse Communication: Mobility status PT Visit Diagnosis: Unsteadiness on feet (R26.81);Other abnormalities of gait and mobility (R26.89);Muscle weakness (generalized) (M62.81)    Time: 1300-1330 PT Time Calculation (min) (ACUTE ONLY): 30 min   Charges:  PT Evaluation $PT Eval Moderate Complexity: 1 Mod PT Treatments $Therapeutic Activity: 8-22 mins        Floria Raveling. Hartnett-Rands, MS, PT Per [facility] [phone] 06/13/2019, 1:52 PM

## 2019-06-13 NOTE — Progress Notes (Signed)
Patient has multiple wounds of differing types.    RUA has what appears to be a loss of skin from shearing maybe.  It is circular, dry and scabbed over.  I could not assess the wound bed.  Surrounding area was red, but blanchable.  The size was 3 x 5 cm.   LUA has a circular area that I was unable to fully assess d/t tan colored slaw covering the wound bed.  The size was 6 x 4.5 cm.  Skin surrounding wound, which may also be shear related is red, but blanchable.    To the inner L knee there is a small 1 x 0.75 cm circular area.  Unable to assess wound bed.  Slough covering area and it appears to be deep.  These wounds her LLE appear to be vascular related. Not consistent with pressure.   To the left of the left shin area there is a 2.5 x 1.25 area.   Further to the outer LLE there is another wound that measures 2.5 x 1.75.  These are all circular and appear to be venous stasis ulcers.  On the left thigh there is a 1 x 1 cm area.    Under bilateral breasts (L worse than R), abdominal folds (L worse than R), and peri-area there is a red rash, which smells like yeast.  Nystatin powder applied after full bath given. The area is open and draining under the L breast and a significant amount of drainage and odor noted under the L abdominal fold.    Thoracic spine has a scabbed over area.  Sacrum has multiple small openings and is purple in color almost resembling a DTI.      Patient states that at home she sits in the chair all the time.  Wound care education given as well as prevention strategies.  Patient seems unmotivated and uninterested in increasing her mobility or independence. Patient made multiple complaints during our care about how "nurses are just in and out" and they "don't take time for me".  Reminded patient that we had been in her room over an hour providing care.  At one point, during bath, pt stated "I feel like I'm dying". Patient was in no distress, vital signs were stable and  breath/heart sounds were unchanged from prior assessment.  Patient repeatedly made demands while staff was in room.  Patient requested water. Water provided.  Patient began coughing after her first few sips, so water removed form patient.  Patient then began complaining of nausea.  No vomiting noted.  Told patient that I did not feel comfortable providing her anymore water because of her coughing spell and nausea after.  Mouth swabs provided for patient comfort.

## 2019-06-13 NOTE — Progress Notes (Signed)
PT STATES SHE NEEDS TO HAVE A BM, LAST BM 06/11/19 PER PT, MEDICATED WITH MIRALAX AS PER ORDERS, PT REFUSED 10 AM DOSE OF COLACE 'IT MAKES MY COLITIS ACT UP", ASKING IF I CAN DO A RECTAL EXAM TO SEE IF THERE IS ANY STOOL THERE. INFORMED PT. THAT SHE HAD BEEN CHECKED BY PREVIOUS NURSE & THERE WAS NO STOOL NOTED PER K. ROSE RN. PT WAS ALSO REMINDED THAT SHE WAS RECEIVING NARCOTICS, ACTIVITY DECREASED & THAT SHE HAD REPORTED POOR APPETITE WHEN ADMITTED, AND THIS MIGHT BE CONTRIBUTING TO HER REPORTED CONSTIPATION.

## 2019-06-13 NOTE — Plan of Care (Signed)
  Problem: Acute Rehab PT Goals(only PT should resolve) Goal: Pt will Roll Supine to Side Outcome: Progressing Goal: Pt Will Go Supine/Side To Sit Outcome: Progressing Goal: Pt Will Go Sit To Supine/Side Outcome: Progressing Goal: Patient Will Transfer Sit To/From Stand Outcome: Progressing Goal: Pt Will Transfer Bed To Chair/Chair To Bed Outcome: Progressing Goal: Pt Will Ambulate Outcome: Progressing   Floria Raveling. Hartnett-Rands, MS, PT Per [place] [phone] 06/13/2019

## 2019-06-14 ENCOUNTER — Encounter (HOSPITAL_COMMUNITY): Payer: Self-pay | Admitting: Primary Care

## 2019-06-14 DIAGNOSIS — Z86718 Personal history of other venous thrombosis and embolism: Secondary | ICD-10-CM | POA: Diagnosis not present

## 2019-06-14 DIAGNOSIS — E876 Hypokalemia: Secondary | ICD-10-CM | POA: Diagnosis present

## 2019-06-14 DIAGNOSIS — R000 Tachycardia, unspecified: Secondary | ICD-10-CM | POA: Diagnosis present

## 2019-06-14 DIAGNOSIS — Z7901 Long term (current) use of anticoagulants: Secondary | ICD-10-CM | POA: Diagnosis not present

## 2019-06-14 DIAGNOSIS — Z7951 Long term (current) use of inhaled steroids: Secondary | ICD-10-CM | POA: Diagnosis not present

## 2019-06-14 DIAGNOSIS — Z7952 Long term (current) use of systemic steroids: Secondary | ICD-10-CM | POA: Diagnosis not present

## 2019-06-14 DIAGNOSIS — M4850XA Collapsed vertebra, not elsewhere classified, site unspecified, initial encounter for fracture: Secondary | ICD-10-CM | POA: Diagnosis present

## 2019-06-14 DIAGNOSIS — M8008XA Age-related osteoporosis with current pathological fracture, vertebra(e), initial encounter for fracture: Secondary | ICD-10-CM | POA: Diagnosis present

## 2019-06-14 DIAGNOSIS — K5900 Constipation, unspecified: Secondary | ICD-10-CM | POA: Diagnosis present

## 2019-06-14 DIAGNOSIS — M4850XD Collapsed vertebra, not elsewhere classified, site unspecified, subsequent encounter for fracture with routine healing: Secondary | ICD-10-CM

## 2019-06-14 DIAGNOSIS — L89322 Pressure ulcer of left buttock, stage 2: Secondary | ICD-10-CM | POA: Diagnosis present

## 2019-06-14 DIAGNOSIS — I872 Venous insufficiency (chronic) (peripheral): Secondary | ICD-10-CM | POA: Diagnosis present

## 2019-06-14 DIAGNOSIS — E8770 Fluid overload, unspecified: Secondary | ICD-10-CM | POA: Diagnosis not present

## 2019-06-14 DIAGNOSIS — Z880 Allergy status to penicillin: Secondary | ICD-10-CM | POA: Diagnosis not present

## 2019-06-14 DIAGNOSIS — K5190 Ulcerative colitis, unspecified, without complications: Secondary | ICD-10-CM | POA: Diagnosis present

## 2019-06-14 DIAGNOSIS — Z8249 Family history of ischemic heart disease and other diseases of the circulatory system: Secondary | ICD-10-CM | POA: Diagnosis not present

## 2019-06-14 DIAGNOSIS — I10 Essential (primary) hypertension: Secondary | ICD-10-CM | POA: Diagnosis present

## 2019-06-14 DIAGNOSIS — J9621 Acute and chronic respiratory failure with hypoxia: Secondary | ICD-10-CM | POA: Diagnosis present

## 2019-06-14 DIAGNOSIS — R339 Retention of urine, unspecified: Secondary | ICD-10-CM | POA: Diagnosis not present

## 2019-06-14 DIAGNOSIS — L304 Erythema intertrigo: Secondary | ICD-10-CM | POA: Diagnosis present

## 2019-06-14 DIAGNOSIS — Z515 Encounter for palliative care: Secondary | ICD-10-CM | POA: Diagnosis not present

## 2019-06-14 DIAGNOSIS — E2740 Unspecified adrenocortical insufficiency: Secondary | ICD-10-CM | POA: Diagnosis present

## 2019-06-14 DIAGNOSIS — J90 Pleural effusion, not elsewhere classified: Secondary | ICD-10-CM | POA: Diagnosis present

## 2019-06-14 DIAGNOSIS — I480 Paroxysmal atrial fibrillation: Secondary | ICD-10-CM | POA: Diagnosis present

## 2019-06-14 DIAGNOSIS — Z79899 Other long term (current) drug therapy: Secondary | ICD-10-CM | POA: Diagnosis not present

## 2019-06-14 DIAGNOSIS — Z7189 Other specified counseling: Secondary | ICD-10-CM

## 2019-06-14 DIAGNOSIS — Z20828 Contact with and (suspected) exposure to other viral communicable diseases: Secondary | ICD-10-CM | POA: Diagnosis present

## 2019-06-14 DIAGNOSIS — Z66 Do not resuscitate: Secondary | ICD-10-CM | POA: Diagnosis present

## 2019-06-14 DIAGNOSIS — Z885 Allergy status to narcotic agent status: Secondary | ICD-10-CM | POA: Diagnosis not present

## 2019-06-14 LAB — URINE CULTURE: Culture: NO GROWTH

## 2019-06-14 MED ORDER — COLLAGENASE 250 UNIT/GM EX OINT
TOPICAL_OINTMENT | Freq: Every day | CUTANEOUS | Status: DC
  Administered 2019-06-15 – 2019-06-16 (×2): via TOPICAL
  Filled 2019-06-14: qty 30

## 2019-06-14 MED ORDER — BISACODYL 10 MG RE SUPP: 10.0000 mg | Freq: Every day | RECTAL | Status: DC | PRN

## 2019-06-14 MED ORDER — POLYETHYLENE GLYCOL 3350 17 G PO PACK
17.0000 g | PACK | Freq: Every day | ORAL | Status: DC
  Administered 2019-06-14: 17 g via ORAL
  Filled 2019-06-14 (×3): qty 1

## 2019-06-14 MED ORDER — ONDANSETRON HCL 4 MG/2ML IJ SOLN: 4.0000 mg | Freq: Four times a day (QID) | INTRAMUSCULAR | Status: DC | PRN

## 2019-06-14 MED ORDER — MILK AND MOLASSES ENEMA: 1.0000 | Freq: Every day | RECTAL | Status: DC | PRN

## 2019-06-14 MED ORDER — POTASSIUM CHLORIDE CRYS ER 20 MEQ PO TBCR
20.0000 meq | EXTENDED_RELEASE_TABLET | Freq: Every day | ORAL | Status: DC
  Filled 2019-06-14: qty 1

## 2019-06-14 NOTE — Consult Note (Addendum)
[place] Nurse wound consult note Consult requested for wounds.  Pt is familiar to [facility] team from recent admission on 8/28: Discussed wound appearance, measurements, and topical treatment plan with the bedside nurse via phone call.  Refer to the wounds as follows as previously noted below:  Wound type: vascular skin changes; history of venous stasis 1. Left medial calf : dark purple irregular shaped non bullous injury 2. Left medial malleolus; dark purple; non bullous injury 3. Left pretibial; 100% eschar, most likely evolved from similar presentation of the other two sites described below 4. Left distal medial thigh; full thickness skin loss 5.  Right posterior calf; dry skin, healed ulceration  Pressure Injury POA: NA Measurement: 1. 3cm x 2.5cm x 0cm; 100% dark purple 2. 0.5cm x 0.5cm x 0cm; 100% dark purple 3. 2cm x 1.5cm x 0.1cm; 100% eschar  4. 1cm x 1cm x 0.1cm; 100% black/yellow  5. 6cm x 5cm x 0cm; scaling skin healed pink  Drainage (amount, consistency, odor) minimal  Periwound: inact, 1+ pitting edema bilateral; distal palpable pulses but weak Dressing procedure/placement/frequency: Enzymatic debridement ointment to the left pretibial and left inner thigh wounds daily, top with dry dressing.  Silicone foam to the posterior right thigh; left medial malleolor; and left medial calf wound, change every 3 days.   Today, the patient also has a full thickness skin tear to left upper extremity; 9X5X.1cm, mod amt thick yellow drainage, 50% red, 50% yellow, no odor. Plan: Mepitel silicone contact layer to protect woundbed and decrease adherence with dressing changes, Aquacel to absorb drainage and provide antimicrobial benefits.   Middle back with full thickness wound; 1X1X.1cm, red and moist, mod amt thick yellow drainage, no odor. Aquacel to absorb drainage to middle back wound.  Topical treatment orders provided for bedside nurses to perform Q day. Patient is followed by Dr. Sharol Given, reports  she sees him on a regular basis. She should follow up with him as outpatient to monitor the status of the LLE wounds.   Please re-consult if further assistance is needed.  Thank-you,  Julien Girt MSN, [place], [place], [place], [place]

## 2019-06-14 NOTE — Progress Notes (Signed)
Wound care provided to all identified wounds on pt. #1: Left upper inner arm: Skin tear 4.9 x 7.8 cm x 0 Shallow base, pink with yellow tissue. Moderate amount thick yellow serosanguinous drainage, some odor. Cleaned, Mepilex applied, then aquacell AG, then foam cover.  #2 Right upper inner arm: Skin tear 8 x 3.3 cm x 0. Shallow base, pink with yellow tissue, curled edge left medial edge. Moderate amount thick yellow/sersanguious drainage with some odor. Cleaned, Mepilex applied, then aquacell AG then foam cover.  #3Left medial knee/lower leg: Ulcer 0.8cm x 1 cm x 2 cm deep. No drainage. Wound base yellow, edges rolled. Cleaned, santyl applied and covered with foam.  #4 Left medial calf: Ulcer 2.3cm x 0.7cm x 0.5cm. Moderate amount thick yellow drainage. Cleaned, santyl applied, covered with foam.  #5 Left lateral calf: Ulcer 2.4 cm x 1.5cm x 0 cm. Shallow, moderate thick yellow drainage, some odor. Cleaned, santyl applied, foam cover applied,  #6 Right posterior calf: Reddness, no open area. 6 cm x 12 cm. Cleaned, dried and protective foam dressing applied.  #7 Mid upper back: 2cm x 2cm x 0 cm. Shallow ulcer, scant amt yellow drainage. Cleaned, santyl applied, covered with foam dressing.  Both heel with protective foam dressing applied. Sacrum with protective foam dressing applied.  Pt tolerated dressing change without complaint.

## 2019-06-14 NOTE — Consult Note (Signed)
Consultation Note Date: 06/14/2019   Patient Name: Betty Bryan  DOB: 1956/08/08  MRN: [id number]  Age / Sex: 63 y.o., female  PCP: Crist Infante, MD Referring Physician: Barton Dubois, MD  Reason for Consultation: Establishing goals of care, Non pain symptom management and Pain control  HPI/Patient Profile: 63 y.o. female  with past medical history of adrenal insufficiency on chronic prednisone, ulcerative colitis, osteoporosis likely related to chronic steroid use, L4/L5 fracture, A. fib on Eliquis, hypertension, multiple recent hospitalization admissions since April of this year, 2 within the last 1 month, admitted on 06/12/2019 with multiple compression fractures of vertebra, uncontrolled pain, chronic adrenal insufficiency only, bilateral pleural effusion.   Clinical Assessment and Goals of Care: Betty Bryan is resting quietly in bed.  She greets me making but not keeping eye contact.  She appears quite frail, chronically ill.  There is no family at bedside at this time.  Betty Bryan tells me that her main concern at this time is her bowel regimen.  She shares that she has ulcerative colitis, and will sometimes move her bowels 3-4 times per day.  She shares that she has not had a bowel movement in several days and is quite uncomfortable.  We talked about what works for her at home.  Bowel regimen changed.  Nursing student at bedside, updated.  Betty Bryan shares that her trusted GI doctor is Dr. Cristina Gong.  We also talked about pain management.  Betty Bryan tells me that at this point she feels her pain management is adequate, again stating that she cannot focus on anything except her bowels at this point.  I share that we will continue to work with her on pain management.  She tells me that her PCP, Dr. Rulon Abide, manages her pain medications.  We talk about in home hospice care.  Betty Bryan tells me that she has private  pay caregivers at home.  She states that she would not want to go to SNF for rehab, instead preferring to receive all of her care in her own home.  We talked about hospice care, what is offered.  I encourage Betty Bryan to have a home visit with Hospice have them tell her what they can and cannot provide.  I reassure her that she will not have to decide immediately, but they are great layer of support, experts at symptom management, giving her someone to call in the night when she has problems.  At this point, Betty Bryan shares that she is unable to focus on anything other than moving her bowels at this time.   PMT to continue to follow  [place] - Betty Bryan tells me that her sister [name] would be her firs surrogate decision maker.  She names her SO/friend [name] as a second Media planner.  We talk about completing paperwork that would allow [name] to speak for her.  She tells me that she will consider completing paperwork another time.     SUMMARY OF  RECOMMENDATIONS   Return to home with private pay aid already in place Home health services Considering at home hospice referral.     Code Status/Advance Care Planning:  DNR - verified by patient  Symptom Management:   We discuss and adjust bowel regimen  Betty Bryan declines to change her pain regimen at this time.   Palliative Prophylaxis:   Palliative Wound Care and Turn Reposition  Additional Recommendations (Limitations, Scope, Preferences):  continue to treat the treatable, no CPR or intubation   Psycho-social/Spiritual:   Desire for further Chaplaincy support:yes  Additional Recommendations: Caregiving  Support/Resources and Education on Hospice  Prognosis:   < 6 months would not be surprising based on 5 hospitalizations in 6 months, poor functional status, chronic illness burden related to chronic steroid use d/t ulcerative colitis.    Discharge Planning: home with [facility], considering at home  treat the treatable hospice      Primary Diagnoses: Present on Admission: . Acute on chronic respiratory failure with hypoxia ([place]) . Adrenal insufficiency ([place]) . AF (paroxysmal atrial fibrillation) ([place]) . Chronic respiratory failure with hypoxia ([place]) . Chronic venous insufficiency . Essential hypertension . Pleural effusion, left . Uncontrolled pain   I have reviewed the medical record, interviewed the patient and family, and examined the patient. The following aspects are pertinent.  Past Medical History:  Diagnosis Date  . Adrenal insufficiency ([place])   . HTN (hypertension)   . L4 vertebral fracture ([facility])    L4-L5 fracture  . Palpitations   . Paroxysmal A-fib ([place])   . PVC's (premature ventricular contractions)   . Ulcerative colitis    Social History   Socioeconomic History  . Marital status: Divorced    Spouse name: Not on file  . Number of children: 0  . Years of education: Not on file  . Highest education level: Not on file  Occupational History  . Occupation: Biochemist, clinical  Social Needs  . Financial resource strain: Not hard at all  . Food insecurity    Worry: Never true    Inability: Never true  . Transportation needs    Medical: No    Non-medical: No  Tobacco Use  . Smoking status: Never Smoker  . Smokeless tobacco: Never Used  Substance and Sexual Activity  . Alcohol use: No    Alcohol/week: 0.0 standard drinks  . Drug use: No  . Sexual activity: Not on file  Lifestyle  . Physical activity    Days per week: 0 days    Minutes per session: 0 min  . Stress: To some extent  Relationships  . Social Herbalist on phone: Patient refused    Gets together: Patient refused    Attends religious service: Patient refused    Active member of club or organization: Patient refused    Attends meetings of clubs or organizations: Patient refused    Relationship status: Patient refused  Other Topics Concern  . Not on file  Social History Narrative   . Not on file   Family History  Problem Relation Age of Onset  . Heart failure Other   . Diabetes Father   . Heart disease Father   . Hypertension Father    Scheduled Meds: . apixaban  2.5 mg Oral BID  . atenolol  25 mg Oral Q8H  . balsalazide  2,250 mg Oral TID  . calcium-vitamin D  1 tablet Oral Daily  . docusate sodium  100 mg Oral BID  . fluticasone  1 spray Each Nare BID  . furosemide  20 mg Intravenous Daily  . hydrocortisone  25 mg Rectal QHS  . loratadine  10 mg Oral Daily  . nystatin   Topical TID  . nystatin cream   Topical BID  . pantoprazole  40 mg Oral Daily  . potassium chloride  20 mEq Oral Daily  . predniSONE  7.5 mg Oral Daily   Continuous Infusions: PRN Meds:.acetaminophen **OR** acetaminophen, gabapentin, HYDROmorphone (DILAUDID) injection, ondansetron (ZOFRAN) IV, oxyCODONE-acetaminophen, polyethylene glycol Medications Prior to Admission:  Prior to Admission medications   Medication Sig Start Date End Date Taking? Authorizing Provider  atenolol (TENORMIN) 25 MG tablet Take 1 tablet (25 mg total) by mouth every 8 (eight) hours. 05/23/19  Yes Aline August, MD  balsalazide (COLAZAL) 750 MG capsule Take 2,250 mg by mouth 3 (three) times daily.   Yes [provider]  calcium-vitamin D (OSCAL WITH D) 500-200 MG-UNIT tablet Take 1 tablet by mouth daily. 05/23/19  Yes Aline August, MD  ELIQUIS 2.5 MG TABS tablet Take 2.5 mg by mouth 2 (two) times daily.  04/28/18  Yes [provider]  fluticasone (FLONASE) 50 MCG/ACT nasal spray Place 1 spray into both nostrils 2 (two) times daily.    Yes [provider]  gabapentin (NEURONTIN) 100 MG capsule Take 100-300 mg by mouth 3 (three) times daily as needed (for nerve pain).    Yes [provider]  HYDROCORTISONE ACE, RECTAL, 30 MG SUPP Place 1 suppository rectally at bedtime.  06/08/15  Yes [provider]  loratadine (CLARITIN) 10 MG tablet Take 10 mg by mouth daily.   Yes  [provider]  nystatin (MYCOSTATIN/NYSTOP) powder Apply 1 application topically 3 (three) times daily.  05/08/18  Yes [provider]  ondansetron (ZOFRAN) 4 MG tablet Take 1 tablet (4 mg total) by mouth every 6 (six) hours as needed for nausea. 05/23/19  Yes Aline August, MD  oxyCODONE-acetaminophen (PERCOCET/ROXICET) 5-325 MG tablet Take 1-2 tablets by mouth every 6 (six) hours as needed for moderate pain or severe pain. 12/31/18  Yes Shah, Pratik D, DO  pantoprazole (PROTONIX) 20 MG tablet Take 20 mg by mouth daily.   Yes [provider]  predniSONE (DELTASONE) 5 MG tablet Take 7.5-8 mg by mouth daily. As directed by your physician 05/05/19  Yes [provider]  collagenase (SANTYL) ointment Apply topically daily. Apply to left leg ulcers daily. Patient not taking: Reported on 06/12/2019 05/24/19   Aline August, MD  diazepam (VALIUM) 5 MG tablet Take 0.5 tablets (2.5 mg total) by mouth 2 (two) times daily as needed for anxiety. Patient not taking: Reported on 06/12/2019 05/23/19   Aline August, MD  docusate sodium (COLACE) 100 MG capsule Take 1 capsule (100 mg total) by mouth 2 (two) times daily. Patient not taking: Reported on 06/12/2019 05/23/19   Aline August, MD  furosemide (LASIX) 20 MG tablet Take 1 tablet (20 mg total) by mouth daily. Patient not taking: Reported on 06/12/2019 05/23/19   Aline August, MD  loperamide (IMODIUM) 2 MG capsule Take 1 capsule (2 mg total) by mouth every 6 (six) hours as needed for diarrhea or loose stools. Patient not taking: Reported on 06/12/2019 05/24/19   Aline August, MD  polyethylene glycol (MIRALAX / GLYCOLAX) 17 g packet Take 17 g by mouth daily as needed for mild constipation. Patient not taking: Reported on 06/12/2019 05/23/19   Aline August, MD   Allergies  Allergen Reactions  . Penicillins Anaphylaxis  Has patient had a PCN reaction causing immediate rash, facial/tongue/throat swelling, SOB or  lightheadedness with hypotension: Yes Has patient had a PCN reaction causing severe rash involving mucus membranes or skin necrosis: Yes Has patient had a PCN reaction that required hospitalization: No Has patient had a PCN reaction occurring within the last 10 years: No If all of the above answers are "NO", then may proceed with Cephalosporin use.   . Sulfa Antibiotics Anaphylaxis  . Morphine And Related Nausea And Vomiting  . Voltaren [Diclofenac]     Chest pains - Voltaren Gel    Review of Systems  Unable to perform ROS: Other    Physical Exam Vitals signs and nursing note reviewed.  Constitutional:      General: She is not in acute distress.    Appearance: She is ill-appearing.  HENT:     Head: Atraumatic.     Comments: Moon facies Cardiovascular:     Rate and Rhythm: Normal rate.  Pulmonary:     Effort: Pulmonary effort is normal. No respiratory distress.  Abdominal:     General: Abdomen is protuberant.     Palpations: Abdomen is soft.  Skin:    General: Skin is warm and dry.  Neurological:     Mental Status: She is alert and oriented to person, place, and time.  Psychiatric:        Mood and Affect: Mood normal.        Behavior: Behavior normal.     Vital Signs: BP 106/74 (BP Location: Right Arm)   Pulse 93   Temp 97.7 F (36.5 C) (Oral)   Resp 16   Ht 5' (1.524 m)   Wt 69.4 kg   SpO2 97%   BMI 29.88 kg/m  Pain Scale: 0-10   Pain Score: 8    SpO2: SpO2: 97 % O2 Device:SpO2: 97 % O2 Flow Rate: .O2 Flow Rate (L/min): 4 L/min  IO: Intake/output summary:   Intake/Output Summary (Last 24 hours) at 06/14/2019 0946 Last data filed at 06/14/2019 [id number] Gross per 24 hour  Intake 720 ml  Output 1700 ml  Net -980 ml    LBM: Last BM Date: 06/11/19 Baseline Weight: Weight: 69.4 kg Most recent weight: Weight: 69.4 kg     Palliative Assessment/Data:   Flowsheet Rows     Most Recent Value  Intake Tab  Referral Department  Hospitalist  Unit at Time of  Referral  Other (Comment)  Palliative Care Primary Diagnosis  Pulmonary  Date Notified  06/13/19  Palliative Care Type  New Palliative care  Reason for referral  Clarify Goals of Care, Pain, End of Life Care Assistance  Date of Admission  06/12/19  Date first seen by Palliative Care  06/14/19  # of days Palliative referral response time  1 Day(s)  # of days IP prior to Palliative referral  1  Clinical Assessment  Palliative Performance Scale Score  40%  Pain Max last 24 hours  Not able to report  Pain Min Last 24 hours  Not able to report  Dyspnea Max Last 24 Hours  Not able to report  Dyspnea Min Last 24 hours  Not able to report  Psychosocial & Spiritual Assessment  Palliative Care Outcomes      Time In: 1025 Time Out: 1115 Time Total: 50 minutes  Greater than 50%  of this time was spent counseling and coordinating care related to the above assessment and plan.  Signed by: Drue Novel, NP   Please  contact Palliative Medicine Team phone at [phone] for questions and concerns.  For individual provider: See Shea Evans

## 2019-06-14 NOTE — Progress Notes (Signed)
PROGRESS NOTE    Betty Bryan  [name] DOB: [birth year] DOA: 06/12/2019 PCP: Crist Infante, MD     Brief Narrative:  As per H&P written by Dr. Waldron Labs on 06/12/19 63 y.o. female, with history of adrenal insufficiency on chronic prednisone, A. fib on Eliquis, hypertension, colitis, multiple recent admissions secondary to sepsis from pneumonia, and cellulitis, patient with multiple recent hospitalizations, 4 admissions since April of this year, 2 of them within 1 month, patient presents to ED today secondary to complaints of back pain, left flank pain, patient on prednisone chronically for adrenal insufficiency and ulcerative colitis, report this has been managed by endocrinologist in [place], patient presents to complaints of back pain/left flank pain for few days, not controlled by her home dose oxycodone, which prompted her to come to ED, as well she does report some mild dyspnea, her Lasix was stopped by her PCP recently as well, she denies fever, chills, chest pain, cough, coffee-ground emesis, or falls. - in ED x-ray significant for mild bilateral effusion, lower extremity edema, CT abdomen pelvis significant mainly for multiple compressive vertebral fracture, a new and progressive, as well patient received IV Lasix, after that she started to complains of suprapubic pain, she had urinary retention where she became tachycardic in the 140s, which she required Foley catheter insertion as well.  Assessment & Plan: 1-multiple compression vertebral fracture and uncontrolled pain -Continue PRN analgesics -Palliative care service has been consulted for advanced directives and assisting with pain management -At this moment patient is interested in to receiving more information from hospice and is open to receive appropriate home DME's to keep herself comfortable. -Continue PRN oxycodone for breakthrough and apply cold compresses for physical measures. Follow palliative care service rec's  for pain management.  -Continue vitamin D and calcium supplement; follow physical therapy evaluation.  2-chronic adrenal insufficiency -Continue prednisone and hydrocortisone. -Actively seen by endocrinologist as an outpatient. -Stable vital signs.  3-bilateral pleural effusion and shortness of breath -Patient radiologic images demonstrating vascular congestion -Continue Lasix and follow renal function/electrolytes trend. -Heart healthy diet recommended. -Patient reports chronically using oxygen supplementation at home  4-urinary retention -Foley catheter in place with good urine output after. -attempt voiding trial on 06/14/19. -Follow clinical response and ability to urinate.  5-leukocytosis -Appears to be chronic -Patient is afebrile and no clear source of infection identified -Continue holding on antibiotics -Follow WBCs trend.  6-history of paroxysmal atrial fibrillation -Currently with sinus tachycardia -Continue the use of atenolol and apixaban -Continue monitoring electrolytes.  7-essential hypertension -Currently stable and well-controlled. -Continue to follow vital signs.   DVT prophylaxis: Eliquis. Code Status: DNR Family Communication: No family at bedside. Disposition Plan: Remains in the hospital, continue supportive care and analgesics.  Attempt voiding trial, assist with constipation and follow palliative care recommendations.  Consultants:   Palliative care  Procedures:   See below for x-ray reports.  Antimicrobials:  Anti-infectives (From admission, onward)   None       Subjective: No fever, no chest pain, no nausea, no vomiting.  Complaining of constipation.  Still with significant back pain.  Patient is severely deconditioned, weak and frail on examination.  Objective: Vitals:   06/13/19 2012 06/13/19 2137 06/14/19 0541 06/14/19 0900  BP:  131/84 122/79 106/74  Pulse:  (!) 108 100 93  Resp:  20 20 16   Temp:  98.1 F (36.7 C) 97.7 F  (36.5 C)   TempSrc:  Oral Oral   SpO2: 100% 98% 98% 97%  Weight:  Height:        Intake/Output Summary (Last 24 hours) at 06/14/2019 1358 Last data filed at 06/14/2019 0648 Gross per 24 hour  Intake 240 ml  Output 1700 ml  Net -1460 ml   Filed Weights   06/12/19 0840  Weight: 69.4 kg    Examination: General exam: Alert, awake, oriented x 3; still complaining of significant pain in her back, no nausea, no vomiting, no chest pain.  Reports breathing is a stable.  Complaining of constipation.  (Patient expressed last bowel movement was 3 days ago). Respiratory system: Fair air movement bilaterally, normal respiratory effort, no using accessory muscles. Cardiovascular system: Rate controlled. No murmurs, rubs, gallops. Gastrointestinal system: Abdomen is nondistended, soft and nontender. No organomegaly or masses felt. Normal bowel sounds heard. Central nervous system: Alert and oriented. No focal neurological deficits. Extremities/skin: No edema, no cyanosis.  Multiple skin injuries and venous ulcers appreciated in the back of her legs bilaterally; also with pressure injury in sacral area (present prior to admission) no signs of superimposed infection appreciated.  Positive for skin folds and under her breasts fungal infection appreciated (less red after application of nystatin powders). Psychiatry: Mood & affect appropriate.    Data Reviewed: I have personally reviewed following labs and imaging studies  CBC: Recent Labs  Lab 06/12/19 1000 06/13/19 0624  WBC 19.0* 20.3*  NEUTROABS 15.7*  --   HGB 12.4 12.6  HCT 41.7 41.1  MCV 99.5 97.2  PLT 195 [id number]   Basic Metabolic Panel: Recent Labs  Lab 06/12/19 1000 06/13/19 0624  NA 142 138  K 3.9 3.0*  CL 105 96*  CO2 28 27  GLUCOSE 82 88  BUN 7* 7*  CREATININE 0.40* 0.54  CALCIUM 8.5* 8.2*   GFR: Estimated Creatinine Clearance: 63.4 mL/min (by C-G formula based on SCr of 0.54 mg/dL).   Liver Function Tests: Recent  Labs  Lab 06/12/19 1000  AST 28  ALT 29  ALKPHOS 134*  BILITOT 0.8  PROT 5.2*  ALBUMIN 2.8*   Recent Labs  Lab 06/12/19 1000  LIPASE 59*   Urine analysis:    Component Value Date/Time   COLORURINE STRAW (A) 06/12/2019 1920   APPEARANCEUR HAZY (A) 06/12/2019 1920   LABSPEC 1.020 06/12/2019 1920   PHURINE 7.0 06/12/2019 1920   GLUCOSEU NEGATIVE 06/12/2019 1920   HGBUR SMALL (A) 06/12/2019 1920   BILIRUBINUR NEGATIVE 06/12/2019 1920   KETONESUR NEGATIVE 06/12/2019 1920   PROTEINUR NEGATIVE 06/12/2019 1920   NITRITE NEGATIVE 06/12/2019 1920   LEUKOCYTESUR NEGATIVE 06/12/2019 1920    Radiology Studies: Ct Abdomen Pelvis W Contrast  Result Date: 06/12/2019 CLINICAL DATA:  Flank pain, abdominal pain EXAM: CT ABDOMEN AND PELVIS WITH CONTRAST TECHNIQUE: Multidetector CT imaging of the abdomen and pelvis was performed using the standard protocol following bolus administration of intravenous contrast. CONTRAST:  149mL OMNIPAQUE IOHEXOL 300 MG/ML  SOLN COMPARISON:  11/25/2018 FINDINGS: Lower chest: Trace bilateral pleural effusions with associated bibasilar atelectasis. Hepatobiliary: Probable focal fatty infiltration along the falciform ligament. Liver otherwise unremarkable. Unremarkable gallbladder. No intrahepatic biliary dilatation. Pancreas: Unremarkable. No pancreatic ductal dilatation or surrounding inflammatory changes. Spleen: Normal in size without focal abnormality. Adrenals/Urinary Tract: Adrenal glands are unremarkable. Kidneys are normal, without renal calculi, focal lesion, or hydronephrosis. Bladder is unremarkable. Stomach/Bowel: Stomach and bowel within normal limits. No dilated loops of bowel. No focal bowel wall thickening or inflammatory changes. Vascular/Lymphatic: No significant vascular findings are present. No enlarged abdominal or pelvic lymph nodes. Reproductive: Multiple partially  calcified uterine fibroids. No adnexal masses. Other: Rectus diastasis. Small fat  containing umbilical hernia. Fluid is noted accumulating along the dependent portion of the left anterior abdominal wall which is displaced to the left. No ascites. Musculoskeletal: There are extensive compression fractures throughout the thoracic and lumbar spine, many of which are chronic, some of which are new or progressed from prior CTs 11/25/2018 and 05/10/2019. There also numerous bilateral subacute to chronic healing rib fractures. Healing right superior and inferior pubic rami fractures. *T4: New superior endplate compression fracture involving approximately 25% vertebral body height loss. *T6: Unchanged advanced compression deformity involving greater than 50% height loss. *T8: Mild superior endplate compression involving less than 25% vertebral body height loss which appears minimally progressed from prior. *T9: Unchanged severe compression fracture. *T11: Unchanged mild superior endplate compression fracture of approximately 25% height loss. *T12: Unchanged central superior endplate compression deformity. *L1: Unchanged superior endplate compression fracture with approximately 50% height loss. *L2: Acute superior endplate compression fracture with approximately 60% vertebral body height loss. *L3: Acute superior endplate compression fracture with approximately 50% vertebral body height loss. *L4: Acute progression of compression fracture now with approximately 75% height loss. *L5: Chronic compression fracture with approximately 50% height loss. IMPRESSION: 1. Multiple new and progressive vertebral body compression fractures throughout the thoracic and lumbar spine including T4, T8, L2, L3, and L4. 2. Trace bilateral pleural effusions with associated bibasilar atelectasis. 3. Fluid accumulation within the superficial soft tissues overlying the left anterior abdominal wall, nonspecific. Electronically Signed   By: Davina Poke M.D.   On: 06/12/2019 15:29     Scheduled Meds:  apixaban  2.5 mg  Oral BID   atenolol  25 mg Oral Q8H   balsalazide  2,250 mg Oral TID   calcium-vitamin D  1 tablet Oral Daily   collagenase   Topical Daily   docusate sodium  100 mg Oral BID   fluticasone  1 spray Each Nare BID   furosemide  20 mg Intravenous Daily   hydrocortisone  25 mg Rectal QHS   loratadine  10 mg Oral Daily   nystatin   Topical TID   nystatin cream   Topical BID   pantoprazole  40 mg Oral Daily   polyethylene glycol  17 g Oral Daily   potassium chloride  20 mEq Oral Daily   predniSONE  7.5 mg Oral Daily   Continuous Infusions:   LOS: 0 days    Time spent: 30 minutes.    Barton Dubois, MD [facility] Pager [phone]   06/14/2019, 1:58 PM

## 2019-06-15 DIAGNOSIS — J9621 Acute and chronic respiratory failure with hypoxia: Secondary | ICD-10-CM

## 2019-06-15 LAB — BASIC METABOLIC PANEL
Anion gap: 15 (ref 5–15)
BUN: 9 mg/dL (ref 8–23)
CO2: 27 mmol/L (ref 22–32)
Calcium: 8 mg/dL — ABNORMAL LOW (ref 8.9–10.3)
Chloride: 93 mmol/L — ABNORMAL LOW (ref 98–111)
Creatinine, Ser: 0.49 mg/dL (ref 0.44–1.00)
GFR calc Af Amer: >60 mL/min (ref >60)
GFR calc non Af Amer: >60 mL/min (ref >60)
Glucose, Bld: 71 mg/dL (ref 70–99)
Potassium: 2.8 mmol/L — ABNORMAL LOW (ref 3.5–5.1)
Sodium: 135 mmol/L (ref 135–145)

## 2019-06-15 LAB — MAGNESIUM: Magnesium: 1.9 mg/dL (ref 1.7–2.4)

## 2019-06-15 LAB — CBC
HCT: 41.9 % (ref 36.0–46.0)
Hemoglobin: 12.7 g/dL (ref 12.0–15.0)
MCH: 29.5 pg (ref 26.0–34.0)
MCHC: 30.3 g/dL (ref 30.0–36.0)
MCV: 97.2 fL (ref 80.0–100.0)
Platelets: 179 10*3/uL (ref 150–400)
RBC: 4.31 MIL/uL (ref 3.87–5.11)
RDW: 15.7 % — ABNORMAL HIGH (ref 11.5–15.5)
WBC: 14 10*3/uL — ABNORMAL HIGH (ref 4.0–10.5)
nRBC: 0 % (ref 0.0–0.2)

## 2019-06-15 MED ORDER — POTASSIUM CHLORIDE CRYS ER 20 MEQ PO TBCR
40.0000 meq | EXTENDED_RELEASE_TABLET | Freq: Two times a day (BID) | ORAL | Status: DC
  Administered 2019-06-15 (×2): 40 meq via ORAL
  Filled 2019-06-15 (×3): qty 2

## 2019-06-15 NOTE — TOC Initial Note (Signed)
Transition of Care [facility]) - Initial/Assessment Note    Patient Details  Name: Betty Bryan MRN: [id number] Date of Birth: December 28, 1955  [facility]) CM/SW Contact:    Boneta Lucks, RN Phone Number: 06/15/2019, 4:23 PM  Clinical Narrative:      Patient admitted for acute/ chronic respiratory failure with hypoxia. Patient well know to CM and has high readmission score. Patient is active with [facility]  For RN/PT. Patient lives with Betty Bryan and has day time private duty sitters.  Patient has all the DME's she needs. Patient seen by palliative.  TOC to follow            Expected Discharge Plan: [place] Barriers to Discharge: Continued Medical Work up   Patient Goals and CMS Choice Patient states their goals for this hospitalization and ongoing recovery are:: to return home.      Expected Discharge Plan and Services Expected Discharge Plan: [place]         Expected Discharge Date: 06/18/19                   Prior Living Arrangements/Services   Lives with:: Spouse          Need for Family Participation in Patient Care: Yes (Comment) Care giver support system in place?: Yes (comment) Current home services: DME, Home RN, Home PT Criminal Activity/Legal Involvement Pertinent to Current Situation/Hospitalization: No - Comment as needed  Activities of Daily Living Home Assistive Devices/Equipment: Built-in shower seat, Oxygen, Wheelchair ADL Screening (condition at time of admission) Patient's cognitive ability adequate to safely complete daily activities?: Yes Is the patient deaf or have difficulty hearing?: No Does the patient have difficulty seeing, even when wearing glasses/contacts?: No Does the patient have difficulty concentrating, remembering, or making decisions?: Yes Patient able to express need for assistance with ADLs?: Yes Does the patient have difficulty dressing or bathing?: Yes Independently performs ADLs?:  No Communication: Independent Dressing (OT): Dependent Is this a change from baseline?: Pre-admission baseline Grooming: Dependent Is this a change from baseline?: Pre-admission baseline Feeding: Independent Bathing: Dependent Is this a change from baseline?: Pre-admission baseline Toileting: Dependent Is this a change from baseline?: Pre-admission baseline In/Out Bed: Dependent Is this a change from baseline?: Pre-admission baseline Walks in Home: Dependent Is this a change from baseline?: Pre-admission baseline Does the patient have difficulty walking or climbing stairs?: Yes Weakness of Legs: Both Weakness of Arms/Hands: Both      Emotional Assessment       Orientation: : Oriented to Self, Oriented to Place, Oriented to  Time Alcohol / Substance Use: Not Applicable Psych Involvement: No (comment)  Admission diagnosis:  Tachycardia [R00.0] Compression fracture of body of thoracic vertebra ([place]) [S22.000A] Compression fracture of lumbar vertebra, initial encounter, unspecified lumbar vertebral level ([place]) [S32.000A] Patient Active Problem List   Diagnosis Date Noted  . Compression fracture of vertebrae, non-traumatic ([place]) 06/14/2019  . DNR (do not resuscitate) discussion   . Encounter for hospice care discussion   . Vertebral fracture, osteoporotic ([place]) 06/12/2019  . Uncontrolled pain 06/12/2019  . Sepsis due to pneumonia ([place]) 05/20/2019  . Cellulitis 05/20/2019  . Adrenal insufficiency ([place]) 05/20/2019  . Palliative care by specialist   . Pain of left lower extremity   . Acute on chronic respiratory failure with hypoxia ([place]) 05/09/2019  . Lobar pneumonia ([place]) 05/09/2019  . Sepsis due to undetermined organism ([place]) 05/09/2019  . Goals of care, counseling/discussion 05/09/2019  .  Pressure injury of skin 05/09/2019  . Sepsis ([place]) 05/08/2019  . Tachycardia 05/08/2019  . Pleural effusion, left 05/08/2019  . Essential hypertension 04/16/2019  . PVC's (premature  ventricular contractions) 04/16/2019  . History of DVT (deep vein thrombosis) 04/16/2019  . Chronic respiratory failure with hypoxia ([place]) 01/29/2019  . AF (paroxysmal atrial fibrillation) ([place]) 01/29/2019  . Hypomagnesemia   . Swelling of both lower extremities   . Hypokalemia 01/28/2019  . Pain 12/23/2018  . Fall at home, initial encounter 12/23/2018  . Multiple rib fractures 12/23/2018  . Clavicle fracture 12/23/2018  . CAP (community acquired pneumonia) 12/23/2018  . Rib pain on right side 01/05/2018  . Varicose veins of leg with complications 0000000  . Chronic venous insufficiency 06/23/2015   PCP:  Crist Infante, MD Pharmacy:   [facility] [place], [place] AT [place] [id number] [place] [address] Phone: [phone] Fax: [phone]     Social Determinants of Health (SDOH) Interventions    Readmission Risk Interventions Readmission Risk Prevention Plan 05/14/2019 05/11/2019 01/29/2019  Transportation Screening - Complete -  PCP or Specialist Appt within 5-7 Days - - Not Complete  Not Complete comments - - Left 2 messages for Dr office scheduler with no return call.  PCP or Specialist Appt within 3-5 Days Not Complete - -  Not Complete comments Message left at clinic. Did not receive return call - -  [place] or [place] - Complete -  Social Work Consult for [place] Planning/Counseling - Complete -  Palliative Care Screening Complete - -  Medication Review (RN Care Manager) - Complete -  Some recent data might be hidden

## 2019-06-15 NOTE — Progress Notes (Signed)
PROGRESS NOTE    Betty Bryan  [id number] DOB: 12/28/1955 DOA: 06/12/2019 PCP: Crist Infante, MD   Brief Narrative:  As per H&P written by Dr. Waldron Labs on 06/12/19 63 y.o.female,with history of adrenal insufficiency on chronic prednisone, A. fib on Eliquis, hypertension, colitis,multiple recent admissions secondary to sepsis from pneumonia, and cellulitis,patient with multiple recent hospitalizations, 4 admissions since April of this year, 2 of them within 1 month, patient presents to ED today secondary to complaints of back pain, left flank pain, patient on prednisone chronically for adrenal insufficiency and ulcerative colitis, report this has been managed by endocrinologist in [place] presents to complaints of back pain/left flank pain for few days, not controlled by her home dose oxycodone, which prompted her to come to ED, as well she does report some mild dyspnea, her Lasix was stopped by her PCP recently as well, she denies fever, chills, chest pain, cough, coffee-ground emesis, or falls. - in EDx-ray significant for mild bilateral effusion, lower extremity edema, CT abdomen pelvis significant mainly for multiple compressive vertebral fracture, a new and progressive,as well patient received IV Lasix, after that she started to complains of suprapubic pain, she had urinary retention where she became tachycardic in the 140s, which she required Foley catheter insertion as well.  9/22: Patient noted to be hypokalemic today and will require supplementation.  Overall appears to be doing better and does not have any further urinary retention issues.  Will replete potassium today and reevaluate in a.m. stable for discharge.  Assessment & Plan:   Active Problems:   Chronic venous insufficiency   Chronic respiratory failure with hypoxia (HCC)   AF (paroxysmal atrial fibrillation) (HCC)   Essential hypertension   History of DVT (deep vein thrombosis)   Pleural effusion, left    Acute on chronic respiratory failure with hypoxia (HCC)   Adrenal insufficiency (HCC)   Vertebral fracture, osteoporotic (HCC)   Uncontrolled pain   Compression fracture of vertebrae, non-traumatic (HCC)   DNR (do not resuscitate) discussion   Encounter for hospice care discussion   1-multiple compression vertebral fracture and uncontrolled pain -Continue PRN analgesics -Palliative care service has been consulted for advanced directives and assisting with pain management -At this moment patient is interested in to receiving more information from hospice and is open to receive appropriate home DME's to keep herself comfortable. -Continue PRN oxycodone for breakthrough and apply cold compresses for physical measures. Follow palliative care service rec's for pain management.  -Continue vitamin D and calcium supplement; follow physical therapy evaluation.  2-chronic adrenal insufficiency -Continue prednisone and hydrocortisone. -Actively seen by endocrinologist as an outpatient. -Stable vital signs.  3-bilateral pleural effusion and shortness of breath -Patient radiologic images demonstrating vascular congestion -Continue Lasix and follow renal function/electrolytes trend. -Heart healthy diet recommended. -Patient reports chronically using oxygen supplementation at home  4-urinary retention -Foley catheter in place with good urine output after. -attempt voiding trial on 06/14/19. -Follow clinical response and ability to urinate.  5-leukocytosis -Appears to be chronic -Patient is afebrile and no clear source of infection identified -Continue holding on antibiotics -Follow WBCs trend.  6-history of paroxysmal atrial fibrillation -Currently with sinus tachycardia -Continue the use of atenolol and apixaban -Continue monitoring electrolytes.  7-essential hypertension -Currently stable and well-controlled. -Continue to follow vital signs.  8-hypokalemia -Replete orally  today and recheck in a.m. -Magnesium within normal limits   DVT prophylaxis: Eliquis. Code Status: DNR Family Communication: No family at bedside. Disposition Plan: Remains in the hospital, continue supportive care  and analgesics.    Continue to replete potassium levels and will recheck in a.m. to ensure stability prior to discharge.  Anticipate discharge to home with home health versus hospice in the next 24 hours.  Consultants:   Palliative care  Procedures:   See below for x-ray reports.  Antimicrobials:  Anti-infectives (From admission, onward)   None       Subjective: Patient seen and evaluated today with no new acute complaints or concerns. No acute concerns or events noted overnight.  Low back pain well controlled.  She has done well through her voiding trial.  Objective: Vitals:   06/14/19 2102 06/15/19 0523 06/15/19 0810 06/15/19 1438  BP: 116/68 125/66  122/68  Pulse: (!) 101 (!) 101  95  Resp: 17 20  20   Temp: 98.2 F (36.8 C) (!) 97.4 F (36.3 C)  98.1 F (36.7 C)  TempSrc: Oral Oral  Oral  SpO2: 100% 98% 97% 97%  Weight:      Height:        Intake/Output Summary (Last 24 hours) at 06/15/2019 1544 Last data filed at 06/15/2019 1436 Gross per 24 hour  Intake 720 ml  Output 1200 ml  Net -480 ml   Filed Weights   06/12/19 0840  Weight: 69.4 kg    Examination:  General exam: Appears calm and comfortable  Respiratory system: Clear to auscultation. Respiratory effort normal.  On 4 L nasal cannula oxygen. Cardiovascular system: S1 & S2 heard, RRR. No JVD, murmurs, rubs, gallops or clicks. No pedal edema. Gastrointestinal system: Abdomen is nondistended, soft and nontender. No organomegaly or masses felt. Normal bowel sounds heard. Central nervous system: Alert and oriented. No focal neurological deficits. Extremities: Symmetric 5 x 5 power. Skin: No rashes, lesions or ulcers Psychiatry: Flat affect    Data Reviewed: I have personally reviewed  following labs and imaging studies  CBC: Recent Labs  Lab 06/12/19 1000 06/13/19 0624 06/15/19 0553  WBC 19.0* 20.3* 14.0*  NEUTROABS 15.7*  --   --   HGB 12.4 12.6 12.7  HCT 41.7 41.1 41.9  MCV 99.5 97.2 97.2  PLT 195 214 0000000   Basic Metabolic Panel: Recent Labs  Lab 06/12/19 1000 06/13/19 0624 06/15/19 0553  NA 142 138 135  K 3.9 3.0* 2.8*  CL 105 96* 93*  CO2 28 27 27   GLUCOSE 82 88 71  BUN 7* 7* 9  CREATININE 0.40* 0.54 0.49  CALCIUM 8.5* 8.2* 8.0*  MG  --   --  1.9   GFR: Estimated Creatinine Clearance: 63.4 mL/min (by C-G formula based on SCr of 0.49 mg/dL). Liver Function Tests: Recent Labs  Lab 06/12/19 1000  AST 28  ALT 29  ALKPHOS 134*  BILITOT 0.8  PROT 5.2*  ALBUMIN 2.8*   Recent Labs  Lab 06/12/19 1000  LIPASE 59*   No results for input(s): AMMONIA in the last 168 hours. Coagulation Profile: No results for input(s): INR, PROTIME in the last 168 hours. Cardiac Enzymes: No results for input(s): CKTOTAL, CKMB, CKMBINDEX, TROPONINI in the last 168 hours. BNP (last 3 results) No results for input(s): PROBNP in the last 8760 hours. HbA1C: No results for input(s): HGBA1C in the last 72 hours. CBG: No results for input(s): GLUCAP in the last 168 hours. Lipid Profile: No results for input(s): CHOL, HDL, LDLCALC, TRIG, CHOLHDL, LDLDIRECT in the last 72 hours. Thyroid Function Tests: No results for input(s): TSH, T4TOTAL, FREET4, T3FREE, THYROIDAB in the last 72 hours. Anemia Panel: No results  for input(s): VITAMINB12, FOLATE, FERRITIN, TIBC, IRON, RETICCTPCT in the last 72 hours. Sepsis Labs: Recent Labs  Lab 06/12/19 1044 06/12/19 1325  LATICACIDVEN 1.3 1.9    Recent Results (from the past 240 hour(s))  SARS CORONAVIRUS 2 (TAT 6-24 HRS) Nasopharyngeal Nasopharyngeal Swab     Status: None   Collection Time: 06/12/19  6:50 PM   Specimen: Nasopharyngeal Swab  Result Value Ref Range Status   SARS Coronavirus 2 NEGATIVE NEGATIVE Final     Comment: (NOTE) SARS-CoV-2 target nucleic acids are NOT DETECTED. The SARS-CoV-2 RNA is generally detectable in upper and lower respiratory specimens during the acute phase of infection. Negative results do not preclude SARS-CoV-2 infection, do not rule out co-infections with other pathogens, and should not be used as the sole basis for treatment or other patient management decisions. Negative results must be combined with clinical observations, patient history, and epidemiological information. The expected result is Negative. Fact Sheet for Patients: SugarRoll.be Fact Sheet for Healthcare Providers: https://www.woods-mathews.com/ This test is not yet approved or cleared by the [place] FDA and  has been authorized for detection and/or diagnosis of SARS-CoV-2 by FDA under an Emergency Use Authorization (EUA). This EUA will remain  in effect (meaning this test can be used) for the duration of the COVID-19 declaration under Section 56 4(b)(1) of the Act, 21 U.S.C. section 360bbb-3(b)(1), unless the authorization is terminated or revoked sooner. Performed at [facility], [place] [address] [place], [place] [address]   Urine culture     Status: None   Collection Time: 06/12/19  7:20 PM   Specimen: Urine, Catheterized  Result Value Ref Range Status   Specimen Description   Final    URINE, CATHETERIZED Performed at [facility], [address], [place], [place] [address]    Special Requests   Final    NONE Performed at [facility], [address] [place], [place] [address]    Culture   Final    NO GROWTH Performed at [facility], [place] [address], [place], [place] [address]    Report Status 06/14/2019 FINAL  Final         Radiology Studies: No results found.      Scheduled Meds: . apixaban  2.5 mg Oral BID  . atenolol  25 mg Oral Q8H  . balsalazide  2,250 mg Oral TID  . calcium-vitamin D  1 tablet Oral Daily   . collagenase   Topical Daily  . docusate sodium  100 mg Oral BID  . fluticasone  1 spray Each Nare BID  . hydrocortisone  25 mg Rectal QHS  . loratadine  10 mg Oral Daily  . nystatin   Topical TID  . nystatin cream   Topical BID  . pantoprazole  40 mg Oral Daily  . polyethylene glycol  17 g Oral Daily  . potassium chloride  40 mEq Oral BID  . predniSONE  7.5 mg Oral Daily   Continuous Infusions:   LOS: 1 day    Time spent: 30 minutes    Betty Robertshaw Darleen Crocker, DO [facility] Pager [phone]  If 7PM-7AM, please contact night-coverage [email or web address] Password [facility] 06/15/2019, 3:44 PM

## 2019-06-16 LAB — CBC
HCT: 37.3 % (ref 36.0–46.0)
Hemoglobin: 11.4 g/dL — ABNORMAL LOW (ref 12.0–15.0)
MCH: 29.7 pg (ref 26.0–34.0)
MCHC: 30.6 g/dL (ref 30.0–36.0)
MCV: 97.1 fL (ref 80.0–100.0)
Platelets: 198 10*3/uL (ref 150–400)
RBC: 3.84 MIL/uL — ABNORMAL LOW (ref 3.87–5.11)
RDW: 15.6 % — ABNORMAL HIGH (ref 11.5–15.5)
WBC: 12.8 10*3/uL — ABNORMAL HIGH (ref 4.0–10.5)
nRBC: 0 % (ref 0.0–0.2)

## 2019-06-16 LAB — BASIC METABOLIC PANEL
Anion gap: 11 (ref 5–15)
BUN: 9 mg/dL (ref 8–23)
CO2: 28 mmol/L (ref 22–32)
Calcium: 8.1 mg/dL — ABNORMAL LOW (ref 8.9–10.3)
Chloride: 97 mmol/L — ABNORMAL LOW (ref 98–111)
Creatinine, Ser: 0.46 mg/dL (ref 0.44–1.00)
GFR calc Af Amer: >60 mL/min (ref >60)
GFR calc non Af Amer: >60 mL/min (ref >60)
Glucose, Bld: 76 mg/dL (ref 70–99)
Potassium: 3.3 mmol/L — ABNORMAL LOW (ref 3.5–5.1)
Sodium: 136 mmol/L (ref 135–145)

## 2019-06-16 MED ORDER — POTASSIUM CHLORIDE CRYS ER 20 MEQ PO TBCR: 40.0000 meq | EXTENDED_RELEASE_TABLET | Freq: Every day | ORAL | 0 refills | Status: DC

## 2019-06-16 MED ORDER — POTASSIUM CHLORIDE CRYS ER 20 MEQ PO TBCR
40.0000 meq | EXTENDED_RELEASE_TABLET | Freq: Every day | ORAL | Status: DC
  Administered 2019-06-16: 40 meq via ORAL
  Filled 2019-06-16: qty 2

## 2019-06-16 NOTE — Progress Notes (Signed)
Patient was d/cd before I could measure wounds. Her ride arrived and she was anxious to leave.

## 2019-06-16 NOTE — Plan of Care (Signed)

## 2019-06-16 NOTE — Progress Notes (Signed)
Palliative: Betty Bryan is resting quietly in bed.  She greets me, making and somewhat keeping eye contact.  We talk about her bowel regimen. We talk about her discharge plan. All questions answered.   Conference with bedside nursing staff related to patient condition, needs, disposition.   Plan:  Home with [place], considering hospice consult.   45 minutes Quinn Axe, NP Palliative Medicine Team Team Phone # [phone] Greater than 50% of this time was spent counseling and coordinating care related to the above assessment and plan.

## 2019-06-16 NOTE — Plan of Care (Signed)
  Problem: Education: Goal: Knowledge of General Education information will improve Description: Including pain rating scale, medication(s)/side effects and non-pharmacologic comfort measures 06/16/2019 1044 by Santa Lighter, RN Outcome: Adequate for Discharge 06/16/2019 1044 by Santa Lighter, RN Outcome: Progressing   Problem: Clinical Measurements: Goal: Ability to maintain clinical measurements within normal limits will improve Outcome: Adequate for Discharge Goal: Will remain free from infection Outcome: Adequate for Discharge Goal: Diagnostic test results will improve Outcome: Adequate for Discharge Goal: Respiratory complications will improve Outcome: Adequate for Discharge Goal: Cardiovascular complication will be avoided Outcome: Adequate for Discharge   Problem: Activity: Goal: Risk for activity intolerance will decrease Outcome: Adequate for Discharge   Problem: Nutrition: Goal: Adequate nutrition will be maintained Outcome: Adequate for Discharge   Problem: Coping: Goal: Level of anxiety will decrease Outcome: Adequate for Discharge   Problem: Elimination: Goal: Will not experience complications related to bowel motility Outcome: Adequate for Discharge Goal: Will not experience complications related to urinary retention Outcome: Adequate for Discharge   Problem: Pain Managment: Goal: General experience of comfort will improve Outcome: Adequate for Discharge   Problem: Safety: Goal: Ability to remain free from injury will improve Outcome: Adequate for Discharge   Problem: Skin Integrity: Goal: Risk for impaired skin integrity will decrease Outcome: Adequate for Discharge

## 2019-06-16 NOTE — Plan of Care (Signed)
  Problem: Education: Goal: Knowledge of General Education information will improve Description Including pain rating scale, medication(s)/side effects and non-pharmacologic comfort measures Outcome: Progressing   Problem: Health Behavior/Discharge Planning: Goal: Ability to manage health-related needs will improve Outcome: Progressing   

## 2019-06-16 NOTE — TOC Transition Note (Signed)
Transition of Care [facility]) - CM/SW Discharge Note   Patient Details  Name: Betty Bryan MRN: [id number] Date of Birth: 08-17-1956  [facility] CM/SW Contact:  Ihor Gully, LCSW Phone Number: 06/16/2019, 12:26 PM   Clinical Narrative:    Santiago Glad at [facility] advised of patient's discharge. Attempt to schedule hospital discharge follow up was advised to leave a message and PCP office would call either LCSW or patient to schedule appointment.      Barriers to Discharge: Continued Medical Work up   Patient Goals and CMS Choice Patient states their goals for this hospitalization and ongoing recovery are:: to return home.      Discharge Placement                       Discharge Plan and Services                                     Social Determinants of Health (SDOH) Interventions     Readmission Risk Interventions Readmission Risk Prevention Plan 05/14/2019 05/11/2019 01/29/2019  Transportation Screening - Complete -  PCP or Specialist Appt within 5-7 Days - - Not Complete  Not Complete comments - - Left 2 messages for Dr office scheduler with no return call.  PCP or Specialist Appt within 3-5 Days Not Complete - -  Not Complete comments Message left at clinic. Did not receive return call - -  [place] or [place] - Complete -  Social Work Consult for [place] Planning/Counseling - Complete -  Palliative Care Screening Complete - -  Medication Review (RN Care Manager) - Complete -  Some recent data might be hidden

## 2019-06-16 NOTE — Progress Notes (Signed)
Nsg Discharge Note  Admit Date:  06/12/2019 Discharge date: 06/16/2019   Betty Bryan to be D/C'd Home per MD order.  AVS completed.  Copy for chart, and copy for patient signed, and dated. Removed IV-clean, dry, intact. Reviewed d/c paperwork with patient. Aniceto Boss the palliative NP asked me to write a note on d/c paperwork for patient to ask her PCP about [place]. I answered all questions. 2 NTs wheeled stable patient to main entrance where she was picked up by her "driver". Patient/caregiver able to verbalize understanding.  Discharge Medication: Allergies as of 06/16/2019      Reactions   Penicillins Anaphylaxis   Has patient had a PCN reaction causing immediate rash, facial/tongue/throat swelling, SOB or lightheadedness with hypotension: Yes Has patient had a PCN reaction causing severe rash involving mucus membranes or skin necrosis: Yes Has patient had a PCN reaction that required hospitalization: No Has patient had a PCN reaction occurring within the last 10 years: No If all of the above answers are "NO", then may proceed with Cephalosporin use.   Sulfa Antibiotics Anaphylaxis   Morphine And Related Nausea And Vomiting   Voltaren [diclofenac]    Chest pains - Voltaren Gel       Medication List    STOP taking these medications   diazepam 5 MG tablet Commonly known as: VALIUM   loperamide 2 MG capsule Commonly known as: IMODIUM     TAKE these medications   atenolol 25 MG tablet Commonly known as: TENORMIN Take 1 tablet (25 mg total) by mouth every 8 (eight) hours.   balsalazide 750 MG capsule Commonly known as: COLAZAL Take 2,250 mg by mouth 3 (three) times daily.   calcium-vitamin D 500-200 MG-UNIT tablet Commonly known as: OSCAL WITH D Take 1 tablet by mouth daily.   collagenase ointment Commonly known as: SANTYL Apply topically daily. Apply to left leg ulcers daily.   docusate sodium 100 MG capsule Commonly known as: COLACE Take 1 capsule (100 mg total) by  mouth 2 (two) times daily.   Eliquis 2.5 MG Tabs tablet Generic drug: apixaban Take 2.5 mg by mouth 2 (two) times daily.   fluticasone 50 MCG/ACT nasal spray Commonly known as: FLONASE Place 1 spray into both nostrils 2 (two) times daily.   furosemide 20 MG tablet Commonly known as: LASIX Take 1 tablet (20 mg total) by mouth daily.   gabapentin 100 MG capsule Commonly known as: NEURONTIN Take 100-300 mg by mouth 3 (three) times daily as needed (for nerve pain).   HYDROCORTISONE ACE (RECTAL) 30 MG Supp Place 1 suppository rectally at bedtime.   loratadine 10 MG tablet Commonly known as: CLARITIN Take 10 mg by mouth daily.   nystatin powder Commonly known as: MYCOSTATIN/NYSTOP Apply 1 application topically 3 (three) times daily.   ondansetron 4 MG tablet Commonly known as: ZOFRAN Take 1 tablet (4 mg total) by mouth every 6 (six) hours as needed for nausea.   oxyCODONE-acetaminophen 5-325 MG tablet Commonly known as: PERCOCET/ROXICET Take 1-2 tablets by mouth every 6 (six) hours as needed for moderate pain or severe pain.   pantoprazole 20 MG tablet Commonly known as: PROTONIX Take 20 mg by mouth daily.   polyethylene glycol 17 g packet Commonly known as: MIRALAX / GLYCOLAX Take 17 g by mouth daily as needed for mild constipation.   potassium chloride SA 20 MEQ tablet Commonly known as: K-DUR Take 2 tablets (40 mEq total) by mouth daily for 3 days. Start taking on: June 17, 2019   predniSONE 5 MG tablet Commonly known as: DELTASONE Take 7.5-8 mg by mouth daily. As directed by your physician       Discharge Assessment: Vitals:   06/16/19 0458 06/16/19 0500  BP: 113/74 115/65  Pulse: 100   Resp: 18   Temp: (!) 97.4 F (36.3 C)   SpO2: 100%    Skin clean, dry and intact without evidence of skin break down, no evidence of skin tears noted. IV catheter discontinued intact. Site without signs and symptoms of complications - no redness or edema noted at  insertion site, patient denies c/o pain - only slight tenderness at site.  Dressing with slight pressure applied.  D/c Instructions-Education: Discharge instructions given to patient/family with verbalized understanding. D/c education completed with patient/family including follow up instructions, medication list, d/c activities limitations if indicated, with other d/c instructions as indicated by MD - patient able to verbalize understanding, all questions fully answered. Patient instructed to return to ED, call 911, or call MD for any changes in condition.  Patient escorted via [place], and D/C home via private auto.  Santa Lighter, RN 06/16/2019 12:28 PM

## 2019-06-16 NOTE — Discharge Summary (Signed)
Physician Discharge Summary  Betty Bryan [id number] DOB: [birth year] DOA: 06/12/2019  PCP: Crist Infante, MD  Admit date: 06/12/2019  Discharge date: 06/16/2019  Admitted From:Home  Disposition:  Home  Recommendations for Outpatient Follow-up:  1. Follow up with PCP in 1-2 weeks 2. Please obtain BMP in one week to reassess potassium levels 3. Continue on potassium 40 mEq daily as prescribed for 3 days and recheck BMP with PCP in 1 week 4. Continue other home medications as prior 5. Patient will consider home hospice after further evaluation by her home health agency in the outpatient setting  Home Health: Is active with home health RN and PT  Equipment/Devices: Has home oxygen  Discharge Condition: Stable  CODE STATUS: DNR  Diet recommendation: Heart Healthy  Brief/Interim Summary: As per H&P written by Dr. Waldron Labs on 06/12/19 62 y.o.female,with history of adrenal insufficiency on chronic prednisone, A. fib on Eliquis, hypertension, colitis,multiple recent admissions secondary to sepsis from pneumonia, and cellulitis,patient with multiple recent hospitalizations, 4 admissions since April of this year, 2 of them within 1 month, patient presents to ED today secondary to complaints of back pain, left flank pain, patient on prednisone chronically for adrenal insufficiency and ulcerative colitis, report this has been managed by endocrinologist in [place] presents to complaints of back pain/left flank pain for few days, not controlled by her home dose oxycodone, which prompted her to come to ED, as well she does report some mild dyspnea, her Lasix was stopped by her PCP recently as well, she denies fever, chills, chest pain, cough, coffee-ground emesis, or falls. - in EDx-ray significant for mild bilateral effusion, lower extremity edema, CT abdomen pelvis significant mainly for multiple compressive vertebral fracture, a new and progressive,as well patient received IV  Lasix, after that she started to complains of suprapubic pain, she had urinary retention where she became tachycardic in the 140s, which she required Foley catheter insertion as well.  9/22: Patient noted to be hypokalemic today and will require supplementation.  Overall appears to be doing better and does not have any further urinary retention issues.  Will replete potassium today and reevaluate in a.m. stable for discharge.  9/23: Patient appears to be doing quite well from a clinical standpoint and is ready for discharge today.  Potassium levels have improved to 3.3 this morning and she will receive another dose of oral 40 mEq potassium prior to discharge.  She will also be given a prescription for potassium for 3 more days and will need close follow-up with her primary care provider in the next 1 week to reassess potassium levels.  Patient is uncertain about hospice at this time and has been seen by palliative care while here.  It does appear that her long-term prognosis is quite poor and she wants to consider home hospice evaluation in the outpatient setting through her home health agency which I believe is appropriate.  Discharge Diagnoses:  Active Problems:   Chronic venous insufficiency   Chronic respiratory failure with hypoxia (HCC)   AF (paroxysmal atrial fibrillation) (HCC)   Essential hypertension   History of DVT (deep vein thrombosis)   Pleural effusion, left   Acute on chronic respiratory failure with hypoxia (HCC)   Adrenal insufficiency (HCC)   Vertebral fracture, osteoporotic (HCC)   Uncontrolled pain   Compression fracture of vertebrae, non-traumatic ([facility])   DNR (do not resuscitate) discussion   Encounter for hospice care discussion    Discharge Instructions  Discharge Instructions    Diet - low  sodium heart healthy   Complete by: As directed    Increase activity slowly   Complete by: As directed      Allergies as of 06/16/2019      Reactions   Penicillins  Anaphylaxis   Has patient had a PCN reaction causing immediate rash, facial/tongue/throat swelling, SOB or lightheadedness with hypotension: Yes Has patient had a PCN reaction causing severe rash involving mucus membranes or skin necrosis: Yes Has patient had a PCN reaction that required hospitalization: No Has patient had a PCN reaction occurring within the last 10 years: No If all of the above answers are "NO", then may proceed with Cephalosporin use.   Sulfa Antibiotics Anaphylaxis   Morphine And Related Nausea And Vomiting   Voltaren [diclofenac]    Chest pains - Voltaren Gel       Medication List    STOP taking these medications   diazepam 5 MG tablet Commonly known as: VALIUM   loperamide 2 MG capsule Commonly known as: IMODIUM     TAKE these medications   atenolol 25 MG tablet Commonly known as: TENORMIN Take 1 tablet (25 mg total) by mouth every 8 (eight) hours.   balsalazide 750 MG capsule Commonly known as: COLAZAL Take 2,250 mg by mouth 3 (three) times daily.   calcium-vitamin D 500-200 MG-UNIT tablet Commonly known as: OSCAL WITH D Take 1 tablet by mouth daily.   collagenase ointment Commonly known as: SANTYL Apply topically daily. Apply to left leg ulcers daily.   docusate sodium 100 MG capsule Commonly known as: COLACE Take 1 capsule (100 mg total) by mouth 2 (two) times daily.   Eliquis 2.5 MG Tabs tablet Generic drug: apixaban Take 2.5 mg by mouth 2 (two) times daily.   fluticasone 50 MCG/ACT nasal spray Commonly known as: FLONASE Place 1 spray into both nostrils 2 (two) times daily.   furosemide 20 MG tablet Commonly known as: LASIX Take 1 tablet (20 mg total) by mouth daily.   gabapentin 100 MG capsule Commonly known as: NEURONTIN Take 100-300 mg by mouth 3 (three) times daily as needed (for nerve pain).   HYDROCORTISONE ACE (RECTAL) 30 MG Supp Place 1 suppository rectally at bedtime.   loratadine 10 MG tablet Commonly known as:  CLARITIN Take 10 mg by mouth daily.   nystatin powder Commonly known as: MYCOSTATIN/NYSTOP Apply 1 application topically 3 (three) times daily.   ondansetron 4 MG tablet Commonly known as: ZOFRAN Take 1 tablet (4 mg total) by mouth every 6 (six) hours as needed for nausea.   oxyCODONE-acetaminophen 5-325 MG tablet Commonly known as: PERCOCET/ROXICET Take 1-2 tablets by mouth every 6 (six) hours as needed for moderate pain or severe pain.   pantoprazole 20 MG tablet Commonly known as: PROTONIX Take 20 mg by mouth daily.   polyethylene glycol 17 g packet Commonly known as: MIRALAX / GLYCOLAX Take 17 g by mouth daily as needed for mild constipation.   potassium chloride SA 20 MEQ tablet Commonly known as: K-DUR Take 2 tablets (40 mEq total) by mouth daily for 3 days. Start taking on: June 17, 2019   predniSONE 5 MG tablet Commonly known as: DELTASONE Take 7.5-8 mg by mouth daily. As directed by your physician      Follow-up Information    Crist Infante, MD Follow up in 1 week(s).   Specialty: Internal Medicine Contact information: [place] [address] [phone]        Buford Dresser, MD .   Specialty:  Cardiology Contact information: [place] [address] [phone]          Allergies  Allergen Reactions  . Penicillins Anaphylaxis    Has patient had a PCN reaction causing immediate rash, facial/tongue/throat swelling, SOB or lightheadedness with hypotension: Yes Has patient had a PCN reaction causing severe rash involving mucus membranes or skin necrosis: Yes Has patient had a PCN reaction that required hospitalization: No Has patient had a PCN reaction occurring within the last 10 years: No If all of the above answers are "NO", then may proceed with Cephalosporin use.   . Sulfa Antibiotics Anaphylaxis  . Morphine And Related Nausea And Vomiting  . Voltaren [Diclofenac]     Chest pains -  Voltaren Gel     Consultations:  Palliative care   Procedures/Studies: Ct Abdomen Pelvis W Contrast  Result Date: 06/12/2019 CLINICAL DATA:  Flank pain, abdominal pain EXAM: CT ABDOMEN AND PELVIS WITH CONTRAST TECHNIQUE: Multidetector CT imaging of the abdomen and pelvis was performed using the standard protocol following bolus administration of intravenous contrast. CONTRAST:  167mL OMNIPAQUE IOHEXOL 300 MG/ML  SOLN COMPARISON:  11/25/2018 FINDINGS: Lower chest: Trace bilateral pleural effusions with associated bibasilar atelectasis. Hepatobiliary: Probable focal fatty infiltration along the falciform ligament. Liver otherwise unremarkable. Unremarkable gallbladder. No intrahepatic biliary dilatation. Pancreas: Unremarkable. No pancreatic ductal dilatation or surrounding inflammatory changes. Spleen: Normal in size without focal abnormality. Adrenals/Urinary Tract: Adrenal glands are unremarkable. Kidneys are normal, without renal calculi, focal lesion, or hydronephrosis. Bladder is unremarkable. Stomach/Bowel: Stomach and bowel within normal limits. No dilated loops of bowel. No focal bowel wall thickening or inflammatory changes. Vascular/Lymphatic: No significant vascular findings are present. No enlarged abdominal or pelvic lymph nodes. Reproductive: Multiple partially calcified uterine fibroids. No adnexal masses. Other: Rectus diastasis. Small fat containing umbilical hernia. Fluid is noted accumulating along the dependent portion of the left anterior abdominal wall which is displaced to the left. No ascites. Musculoskeletal: There are extensive compression fractures throughout the thoracic and lumbar spine, many of which are chronic, some of which are new or progressed from prior CTs 11/25/2018 and 05/10/2019. There also numerous bilateral subacute to chronic healing rib fractures. Healing right superior and inferior pubic rami fractures. *T4: New superior endplate compression fracture involving  approximately 25% vertebral body height loss. *T6: Unchanged advanced compression deformity involving greater than 50% height loss. *T8: Mild superior endplate compression involving less than 25% vertebral body height loss which appears minimally progressed from prior. *T9: Unchanged severe compression fracture. *T11: Unchanged mild superior endplate compression fracture of approximately 25% height loss. *T12: Unchanged central superior endplate compression deformity. *L1: Unchanged superior endplate compression fracture with approximately 50% height loss. *L2: Acute superior endplate compression fracture with approximately 60% vertebral body height loss. *L3: Acute superior endplate compression fracture with approximately 50% vertebral body height loss. *L4: Acute progression of compression fracture now with approximately 75% height loss. *L5: Chronic compression fracture with approximately 50% height loss. IMPRESSION: 1. Multiple new and progressive vertebral body compression fractures throughout the thoracic and lumbar spine including T4, T8, L2, L3, and L4. 2. Trace bilateral pleural effusions with associated bibasilar atelectasis. 3. Fluid accumulation within the superficial soft tissues overlying the left anterior abdominal wall, nonspecific. Electronically Signed   By: Davina Poke M.D.   On: 06/12/2019 15:29   Dg Chest Portable 1 View  Result Date: 06/12/2019 CLINICAL DATA:  Left-sided chest and abdominal pain, history of pneumonia EXAM: PORTABLE CHEST 1 VIEW COMPARISON:  05/20/2019  FINDINGS: Lordotic, low volume AP portable examination. No evident interval change in a probable left pleural effusion with associated atelectasis or consolidation. Pulmonary vascular prominence and diffuse interstitial opacity, likely edema. No new or focal airspace opacity. The cardiac borders are predominantly obscured. IMPRESSION: Lordotic, low volume AP portable examination. No evident interval change in a probable  left pleural effusion with associated atelectasis or consolidation. Pulmonary vascular prominence and diffuse interstitial opacity, likely edema. No new or focal airspace opacity. Electronically Signed   By: Eddie Candle M.D.   On: 06/12/2019 10:36   Dg Chest Port 1 View  Result Date: 05/20/2019 CLINICAL DATA:  Fever EXAM: PORTABLE CHEST 1 VIEW COMPARISON:  May 08, 2019 FINDINGS: There remains airspace consolidation throughout the left mid and lower lung zones with left pleural effusion. The right lung is now essentially clear. Heart is mildly enlarged with pulmonary vascularity normal. No adenopathy. 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 No bone lesions. IMPRESSION: Extensive airspace consolidation throughout the left lower lobe with left pleural effusion. Right lung is now clear. Stable cardiac prominence. Electronically Signed   By: Lowella Grip III M.D.   On: 05/20/2019 15:13     Discharge Exam: Vitals:   06/16/19 0458 06/16/19 0500  BP: 113/74 115/65  Pulse: 100   Resp: 18   Temp: (!) 97.4 F (36.3 C)   SpO2: 100%    Vitals:   06/15/19 1900 06/15/19 2141 06/16/19 0458 06/16/19 0500  BP:  125/80 113/74 115/65  Pulse:  (!) 102 100   Resp:  16 18   Temp:  98 F (36.7 C) (!) 97.4 F (36.3 C)   TempSrc:  Oral Oral   SpO2: 97% 98% 100%   Weight:      Height:        General: Pt is alert, awake, not in acute distress Cardiovascular: RRR, S1/S2 +, no rubs, no gallops Respiratory: CTA bilaterally, no wheezing, no rhonchi, currently on 4 L nasal cannula Abdominal: Soft, NT, ND, bowel sounds + Extremities: no edema, no cyanosis    The results of significant diagnostics from this hospitalization (including imaging, microbiology, ancillary and laboratory) are listed below for reference.     Microbiology: Recent Results (from the past 240 hour(s))  SARS CORONAVIRUS 2 (TAT 6-24 HRS) Nasopharyngeal Nasopharyngeal Swab     Status: None   Collection Time: 06/12/19  6:50 PM    Specimen: Nasopharyngeal Swab  Result Value Ref Range Status   SARS Coronavirus 2 NEGATIVE NEGATIVE Final    Comment: (NOTE) SARS-CoV-2 target nucleic acids are NOT DETECTED. The SARS-CoV-2 RNA is generally detectable in upper and lower respiratory specimens during the acute phase of infection. Negative results do not preclude SARS-CoV-2 infection, do not rule out co-infections with other pathogens, and should not be used as the sole basis for treatment or other patient management decisions. Negative results must be combined with clinical observations, patient history, and epidemiological information. The expected result is Negative. Fact Sheet for Patients: SugarRoll.be Fact Sheet for Healthcare Providers: https://www.woods-mathews.com/ This test is not yet approved or cleared by the [place] FDA and  has been authorized for detection and/or diagnosis of SARS-CoV-2 by FDA under an Emergency Use Authorization (EUA). This EUA will remain  in effect (meaning this test can be used) for the duration of the COVID-19 declaration under Section 56 4(b)(1) of the Act, 21 U.S.C. section 360bbb-3(b)(1), unless the authorization is terminated or revoked sooner. Performed at [facility], [place],  [place] [address]   Urine culture     Status: None   Collection Time: 06/12/19  7:20 PM   Specimen: Urine, Catheterized  Result Value Ref Range Status   Specimen Description   Final    URINE, CATHETERIZED Performed at [facility] [place], [address], [place], [place] [address]    Special Requests   Final    NONE Performed at [facility], [address] [place], [place] [address]    Culture   Final    NO GROWTH Performed at [facility], [place] [address] [place],  [address]    Report Status 06/14/2019 FINAL  Final     Labs: BNP (last 3 results) Recent Labs    01/30/19 0911 05/08/19 2117 06/12/19 0939  BNP  97.0 112.0* 123456*   Basic Metabolic Panel: Recent Labs  Lab 06/12/19 1000 06/13/19 0624 06/15/19 0553 06/16/19 0550  NA 142 138 135 136  K 3.9 3.0* 2.8* 3.3*  CL 105 96* 93* 97*  CO2 28 27 27 28   GLUCOSE 82 88 71 76  BUN 7* 7* 9 9  CREATININE 0.40* 0.54 0.49 0.46  CALCIUM 8.5* 8.2* 8.0* 8.1*  MG  --   --  1.9  --    Liver Function Tests: Recent Labs  Lab 06/12/19 1000  AST 28  ALT 29  ALKPHOS 134*  BILITOT 0.8  PROT 5.2*  ALBUMIN 2.8*   Recent Labs  Lab 06/12/19 1000  LIPASE 59*   No results for input(s): AMMONIA in the last 168 hours. CBC: Recent Labs  Lab 06/12/19 1000 06/13/19 0624 06/15/19 0553 06/16/19 0550  WBC 19.0* 20.3* 14.0* 12.8*  NEUTROABS 15.7*  --   --   --   HGB 12.4 12.6 12.7 11.4*  HCT 41.7 41.1 41.9 37.3  MCV 99.5 97.2 97.2 97.1  PLT 195 214 179 198   Cardiac Enzymes: No results for input(s): CKTOTAL, CKMB, CKMBINDEX, TROPONINI in the last 168 hours. BNP: Invalid input(s): POCBNP CBG: No results for input(s): GLUCAP in the last 168 hours. D-Dimer No results for input(s): DDIMER in the last 72 hours. Hgb A1c No results for input(s): HGBA1C in the last 72 hours. Lipid Profile No results for input(s): CHOL, HDL, LDLCALC, TRIG, CHOLHDL, LDLDIRECT in the last 72 hours. Thyroid function studies No results for input(s): TSH, T4TOTAL, T3FREE, THYROIDAB in the last 72 hours.  Invalid input(s): FREET3 Anemia work up No results for input(s): VITAMINB12, FOLATE, FERRITIN, TIBC, IRON, RETICCTPCT in the last 72 hours. Urinalysis    Component Value Date/Time   COLORURINE STRAW (A) 06/12/2019 1920   APPEARANCEUR HAZY (A) 06/12/2019 1920   LABSPEC 1.020 06/12/2019 1920   PHURINE 7.0 06/12/2019 1920   GLUCOSEU NEGATIVE 06/12/2019 1920   HGBUR SMALL (A) 06/12/2019 1920   BILIRUBINUR NEGATIVE 06/12/2019 1920   KETONESUR NEGATIVE 06/12/2019 1920   PROTEINUR NEGATIVE 06/12/2019 1920   NITRITE NEGATIVE 06/12/2019 1920   LEUKOCYTESUR  NEGATIVE 06/12/2019 1920   Sepsis Labs Invalid input(s): PROCALCITONIN,  WBC,  LACTICIDVEN Microbiology Recent Results (from the past 240 hour(s))  SARS CORONAVIRUS 2 (TAT 6-24 HRS) Nasopharyngeal Nasopharyngeal Swab     Status: None   Collection Time: 06/12/19  6:50 PM   Specimen: Nasopharyngeal Swab  Result Value Ref Range Status   SARS Coronavirus 2 NEGATIVE NEGATIVE Final    Comment: (NOTE) SARS-CoV-2 target nucleic acids are NOT DETECTED. The SARS-CoV-2 RNA is generally detectable in upper and lower respiratory specimens during the acute phase of infection.  Negative results do not preclude SARS-CoV-2 infection, do not rule out co-infections with other pathogens, and should not be used as the sole basis for treatment or other patient management decisions. Negative results must be combined with clinical observations, patient history, and epidemiological information. The expected result is Negative. Fact Sheet for Patients: SugarRoll.be Fact Sheet for Healthcare Providers: https://www.woods-mathews.com/ This test is not yet approved or cleared by the [place] FDA and  has been authorized for detection and/or diagnosis of SARS-CoV-2 by FDA under an Emergency Use Authorization (EUA). This EUA will remain  in effect (meaning this test can be used) for the duration of the COVID-19 declaration under Section 56 4(b)(1) of the Act, 21 U.S.C. section 360bbb-3(b)(1), unless the authorization is terminated or revoked sooner. Performed at [facility], [place] [address], [place], [place] [address]   Urine culture     Status: None   Collection Time: 06/12/19  7:20 PM   Specimen: Urine, Catheterized  Result Value Ref Range Status   Specimen Description   Final    URINE, CATHETERIZED Performed at [facility], [address] [place], [place] [address]    Special Requests   Final    NONE Performed at [facility], [address],  [place], [place] [address]    Culture   Final    NO GROWTH Performed at [facility], [place] [address], [place], [place] [address]    Report Status 06/14/2019 FINAL  Final     Time coordinating discharge: 35 minutes  SIGNED:   Rodena Goldmann, DO [facility] 06/16/2019, 9:24 AM  If 7PM-7AM, please contact night-coverage [email or web address] Password [email or web address]

## 2019-06-18 DIAGNOSIS — I10 Essential (primary) hypertension: Secondary | ICD-10-CM | POA: Diagnosis not present

## 2019-06-18 DIAGNOSIS — R32 Unspecified urinary incontinence: Secondary | ICD-10-CM | POA: Diagnosis not present

## 2019-06-18 DIAGNOSIS — J9610 Chronic respiratory failure, unspecified whether with hypoxia or hypercapnia: Secondary | ICD-10-CM | POA: Diagnosis not present

## 2019-06-18 DIAGNOSIS — R159 Full incontinence of feces: Secondary | ICD-10-CM | POA: Diagnosis not present

## 2019-06-18 DIAGNOSIS — I4891 Unspecified atrial fibrillation: Secondary | ICD-10-CM | POA: Diagnosis not present

## 2019-06-18 DIAGNOSIS — Z515 Encounter for palliative care: Secondary | ICD-10-CM | POA: Diagnosis not present

## 2019-06-18 DIAGNOSIS — I509 Heart failure, unspecified: Secondary | ICD-10-CM | POA: Diagnosis not present

## 2019-06-18 DIAGNOSIS — E2749 Other adrenocortical insufficiency: Secondary | ICD-10-CM | POA: Diagnosis not present

## 2019-06-19 DIAGNOSIS — R32 Unspecified urinary incontinence: Secondary | ICD-10-CM | POA: Diagnosis not present

## 2019-06-19 DIAGNOSIS — I4891 Unspecified atrial fibrillation: Secondary | ICD-10-CM | POA: Diagnosis not present

## 2019-06-19 DIAGNOSIS — J9610 Chronic respiratory failure, unspecified whether with hypoxia or hypercapnia: Secondary | ICD-10-CM | POA: Diagnosis not present

## 2019-06-19 DIAGNOSIS — E2749 Other adrenocortical insufficiency: Secondary | ICD-10-CM | POA: Diagnosis not present

## 2019-06-19 DIAGNOSIS — Z515 Encounter for palliative care: Secondary | ICD-10-CM | POA: Diagnosis not present

## 2019-06-19 DIAGNOSIS — I10 Essential (primary) hypertension: Secondary | ICD-10-CM | POA: Diagnosis not present

## 2019-06-19 DIAGNOSIS — I509 Heart failure, unspecified: Secondary | ICD-10-CM | POA: Diagnosis not present

## 2019-06-19 DIAGNOSIS — R159 Full incontinence of feces: Secondary | ICD-10-CM | POA: Diagnosis not present

## 2019-06-20 DIAGNOSIS — I10 Essential (primary) hypertension: Secondary | ICD-10-CM | POA: Diagnosis not present

## 2019-06-20 DIAGNOSIS — R32 Unspecified urinary incontinence: Secondary | ICD-10-CM | POA: Diagnosis not present

## 2019-06-20 DIAGNOSIS — J9610 Chronic respiratory failure, unspecified whether with hypoxia or hypercapnia: Secondary | ICD-10-CM | POA: Diagnosis not present

## 2019-06-20 DIAGNOSIS — I509 Heart failure, unspecified: Secondary | ICD-10-CM | POA: Diagnosis not present

## 2019-06-20 DIAGNOSIS — Z515 Encounter for palliative care: Secondary | ICD-10-CM | POA: Diagnosis not present

## 2019-06-20 DIAGNOSIS — I4891 Unspecified atrial fibrillation: Secondary | ICD-10-CM | POA: Diagnosis not present

## 2019-06-20 DIAGNOSIS — R159 Full incontinence of feces: Secondary | ICD-10-CM | POA: Diagnosis not present

## 2019-06-20 DIAGNOSIS — E2749 Other adrenocortical insufficiency: Secondary | ICD-10-CM | POA: Diagnosis not present

## 2019-06-21 DIAGNOSIS — J9610 Chronic respiratory failure, unspecified whether with hypoxia or hypercapnia: Secondary | ICD-10-CM | POA: Diagnosis not present

## 2019-06-21 DIAGNOSIS — E2749 Other adrenocortical insufficiency: Secondary | ICD-10-CM | POA: Diagnosis not present

## 2019-06-21 DIAGNOSIS — I4891 Unspecified atrial fibrillation: Secondary | ICD-10-CM | POA: Diagnosis not present

## 2019-06-21 DIAGNOSIS — Z515 Encounter for palliative care: Secondary | ICD-10-CM | POA: Diagnosis not present

## 2019-06-21 DIAGNOSIS — R32 Unspecified urinary incontinence: Secondary | ICD-10-CM | POA: Diagnosis not present

## 2019-06-21 DIAGNOSIS — I509 Heart failure, unspecified: Secondary | ICD-10-CM | POA: Diagnosis not present

## 2019-06-21 DIAGNOSIS — R159 Full incontinence of feces: Secondary | ICD-10-CM | POA: Diagnosis not present

## 2019-06-21 DIAGNOSIS — I10 Essential (primary) hypertension: Secondary | ICD-10-CM | POA: Diagnosis not present

## 2019-06-21 DIAGNOSIS — J449 Chronic obstructive pulmonary disease, unspecified: Secondary | ICD-10-CM | POA: Diagnosis not present

## 2019-06-22 DIAGNOSIS — R159 Full incontinence of feces: Secondary | ICD-10-CM | POA: Diagnosis not present

## 2019-06-22 DIAGNOSIS — Z515 Encounter for palliative care: Secondary | ICD-10-CM | POA: Diagnosis not present

## 2019-06-22 DIAGNOSIS — I10 Essential (primary) hypertension: Secondary | ICD-10-CM | POA: Diagnosis not present

## 2019-06-22 DIAGNOSIS — I4891 Unspecified atrial fibrillation: Secondary | ICD-10-CM | POA: Diagnosis not present

## 2019-06-22 DIAGNOSIS — J9610 Chronic respiratory failure, unspecified whether with hypoxia or hypercapnia: Secondary | ICD-10-CM | POA: Diagnosis not present

## 2019-06-22 DIAGNOSIS — I509 Heart failure, unspecified: Secondary | ICD-10-CM | POA: Diagnosis not present

## 2019-06-22 DIAGNOSIS — E2749 Other adrenocortical insufficiency: Secondary | ICD-10-CM | POA: Diagnosis not present

## 2019-06-22 DIAGNOSIS — R32 Unspecified urinary incontinence: Secondary | ICD-10-CM | POA: Diagnosis not present

## 2019-06-22 DIAGNOSIS — R000 Tachycardia, unspecified: Secondary | ICD-10-CM | POA: Diagnosis not present

## 2019-06-23 ENCOUNTER — Other Ambulatory Visit (HOSPITAL_COMMUNITY): Payer: Self-pay

## 2019-06-23 DIAGNOSIS — Z515 Encounter for palliative care: Secondary | ICD-10-CM | POA: Diagnosis not present

## 2019-06-23 DIAGNOSIS — R32 Unspecified urinary incontinence: Secondary | ICD-10-CM | POA: Diagnosis not present

## 2019-06-23 DIAGNOSIS — I4891 Unspecified atrial fibrillation: Secondary | ICD-10-CM | POA: Diagnosis not present

## 2019-06-23 DIAGNOSIS — J9610 Chronic respiratory failure, unspecified whether with hypoxia or hypercapnia: Secondary | ICD-10-CM | POA: Diagnosis not present

## 2019-06-23 DIAGNOSIS — R159 Full incontinence of feces: Secondary | ICD-10-CM | POA: Diagnosis not present

## 2019-06-23 DIAGNOSIS — E2749 Other adrenocortical insufficiency: Secondary | ICD-10-CM | POA: Diagnosis not present

## 2019-06-23 DIAGNOSIS — I10 Essential (primary) hypertension: Secondary | ICD-10-CM | POA: Diagnosis not present

## 2019-06-23 DIAGNOSIS — I509 Heart failure, unspecified: Secondary | ICD-10-CM | POA: Diagnosis not present

## 2019-06-24 ENCOUNTER — Inpatient Hospital Stay (HOSPITAL_COMMUNITY)
Admission: RE | Admit: 2019-06-24 | Discharge: 2019-06-24 | Disposition: A | Discharge status: Home / Self Care | Payer: BC Managed Care – PPO | Source: Ambulatory Visit | Attending: Internal Medicine | Admitting: Internal Medicine

## 2019-06-24 DIAGNOSIS — R32 Unspecified urinary incontinence: Secondary | ICD-10-CM | POA: Diagnosis not present

## 2019-06-24 DIAGNOSIS — E2749 Other adrenocortical insufficiency: Secondary | ICD-10-CM | POA: Diagnosis not present

## 2019-06-24 DIAGNOSIS — I509 Heart failure, unspecified: Secondary | ICD-10-CM | POA: Diagnosis not present

## 2019-06-24 DIAGNOSIS — I4891 Unspecified atrial fibrillation: Secondary | ICD-10-CM | POA: Diagnosis not present

## 2019-06-24 DIAGNOSIS — J9610 Chronic respiratory failure, unspecified whether with hypoxia or hypercapnia: Secondary | ICD-10-CM | POA: Diagnosis not present

## 2019-06-24 DIAGNOSIS — I10 Essential (primary) hypertension: Secondary | ICD-10-CM | POA: Diagnosis not present

## 2019-06-24 DIAGNOSIS — R159 Full incontinence of feces: Secondary | ICD-10-CM | POA: Diagnosis not present

## 2019-06-24 DIAGNOSIS — Z515 Encounter for palliative care: Secondary | ICD-10-CM | POA: Diagnosis not present

## 2019-06-25 DIAGNOSIS — L8990 Pressure ulcer of unspecified site, unspecified stage: Secondary | ICD-10-CM | POA: Diagnosis not present

## 2019-06-25 DIAGNOSIS — R0600 Dyspnea, unspecified: Secondary | ICD-10-CM | POA: Diagnosis not present

## 2019-06-25 DIAGNOSIS — Z515 Encounter for palliative care: Secondary | ICD-10-CM | POA: Diagnosis not present

## 2019-06-25 DIAGNOSIS — R159 Full incontinence of feces: Secondary | ICD-10-CM | POA: Diagnosis not present

## 2019-06-25 DIAGNOSIS — I10 Essential (primary) hypertension: Secondary | ICD-10-CM | POA: Diagnosis not present

## 2019-06-25 DIAGNOSIS — J9610 Chronic respiratory failure, unspecified whether with hypoxia or hypercapnia: Secondary | ICD-10-CM | POA: Diagnosis not present

## 2019-06-25 DIAGNOSIS — R32 Unspecified urinary incontinence: Secondary | ICD-10-CM | POA: Diagnosis not present

## 2019-06-25 DIAGNOSIS — E2749 Other adrenocortical insufficiency: Secondary | ICD-10-CM | POA: Diagnosis not present

## 2019-06-25 DIAGNOSIS — Z5189 Encounter for other specified aftercare: Secondary | ICD-10-CM | POA: Diagnosis not present

## 2019-06-25 DIAGNOSIS — I509 Heart failure, unspecified: Secondary | ICD-10-CM | POA: Diagnosis not present

## 2019-06-25 DIAGNOSIS — R112 Nausea with vomiting, unspecified: Secondary | ICD-10-CM | POA: Diagnosis not present

## 2019-06-25 DIAGNOSIS — I4891 Unspecified atrial fibrillation: Secondary | ICD-10-CM | POA: Diagnosis not present

## 2019-06-26 DIAGNOSIS — R159 Full incontinence of feces: Secondary | ICD-10-CM | POA: Diagnosis not present

## 2019-06-26 DIAGNOSIS — I4891 Unspecified atrial fibrillation: Secondary | ICD-10-CM | POA: Diagnosis not present

## 2019-06-26 DIAGNOSIS — I509 Heart failure, unspecified: Secondary | ICD-10-CM | POA: Diagnosis not present

## 2019-06-26 DIAGNOSIS — R32 Unspecified urinary incontinence: Secondary | ICD-10-CM | POA: Diagnosis not present

## 2019-06-26 DIAGNOSIS — I10 Essential (primary) hypertension: Secondary | ICD-10-CM | POA: Diagnosis not present

## 2019-06-26 DIAGNOSIS — Z515 Encounter for palliative care: Secondary | ICD-10-CM | POA: Diagnosis not present

## 2019-06-26 DIAGNOSIS — J9610 Chronic respiratory failure, unspecified whether with hypoxia or hypercapnia: Secondary | ICD-10-CM | POA: Diagnosis not present

## 2019-06-26 DIAGNOSIS — E2749 Other adrenocortical insufficiency: Secondary | ICD-10-CM | POA: Diagnosis not present

## 2019-06-27 DIAGNOSIS — I10 Essential (primary) hypertension: Secondary | ICD-10-CM | POA: Diagnosis not present

## 2019-06-27 DIAGNOSIS — I4891 Unspecified atrial fibrillation: Secondary | ICD-10-CM | POA: Diagnosis not present

## 2019-06-27 DIAGNOSIS — R159 Full incontinence of feces: Secondary | ICD-10-CM | POA: Diagnosis not present

## 2019-06-27 DIAGNOSIS — I509 Heart failure, unspecified: Secondary | ICD-10-CM | POA: Diagnosis not present

## 2019-06-27 DIAGNOSIS — R32 Unspecified urinary incontinence: Secondary | ICD-10-CM | POA: Diagnosis not present

## 2019-06-27 DIAGNOSIS — Z515 Encounter for palliative care: Secondary | ICD-10-CM | POA: Diagnosis not present

## 2019-06-27 DIAGNOSIS — J9610 Chronic respiratory failure, unspecified whether with hypoxia or hypercapnia: Secondary | ICD-10-CM | POA: Diagnosis not present

## 2019-06-27 DIAGNOSIS — E2749 Other adrenocortical insufficiency: Secondary | ICD-10-CM | POA: Diagnosis not present

## 2019-06-28 DIAGNOSIS — I509 Heart failure, unspecified: Secondary | ICD-10-CM | POA: Diagnosis not present

## 2019-06-28 DIAGNOSIS — R32 Unspecified urinary incontinence: Secondary | ICD-10-CM | POA: Diagnosis not present

## 2019-06-28 DIAGNOSIS — R159 Full incontinence of feces: Secondary | ICD-10-CM | POA: Diagnosis not present

## 2019-06-28 DIAGNOSIS — I10 Essential (primary) hypertension: Secondary | ICD-10-CM | POA: Diagnosis not present

## 2019-06-28 DIAGNOSIS — I4891 Unspecified atrial fibrillation: Secondary | ICD-10-CM | POA: Diagnosis not present

## 2019-06-28 DIAGNOSIS — E2749 Other adrenocortical insufficiency: Secondary | ICD-10-CM | POA: Diagnosis not present

## 2019-06-28 DIAGNOSIS — J9610 Chronic respiratory failure, unspecified whether with hypoxia or hypercapnia: Secondary | ICD-10-CM | POA: Diagnosis not present

## 2019-06-28 DIAGNOSIS — Z515 Encounter for palliative care: Secondary | ICD-10-CM | POA: Diagnosis not present

## 2019-06-29 ENCOUNTER — Other Ambulatory Visit: Payer: Self-pay

## 2019-06-29 DIAGNOSIS — Z515 Encounter for palliative care: Secondary | ICD-10-CM | POA: Diagnosis not present

## 2019-06-29 DIAGNOSIS — I509 Heart failure, unspecified: Secondary | ICD-10-CM | POA: Diagnosis not present

## 2019-06-29 DIAGNOSIS — R32 Unspecified urinary incontinence: Secondary | ICD-10-CM | POA: Diagnosis not present

## 2019-06-29 DIAGNOSIS — I10 Essential (primary) hypertension: Secondary | ICD-10-CM | POA: Diagnosis not present

## 2019-06-29 DIAGNOSIS — E2749 Other adrenocortical insufficiency: Secondary | ICD-10-CM | POA: Diagnosis not present

## 2019-06-29 DIAGNOSIS — I4891 Unspecified atrial fibrillation: Secondary | ICD-10-CM | POA: Diagnosis not present

## 2019-06-29 DIAGNOSIS — R159 Full incontinence of feces: Secondary | ICD-10-CM | POA: Diagnosis not present

## 2019-06-29 DIAGNOSIS — J9610 Chronic respiratory failure, unspecified whether with hypoxia or hypercapnia: Secondary | ICD-10-CM | POA: Diagnosis not present

## 2019-06-30 DIAGNOSIS — R159 Full incontinence of feces: Secondary | ICD-10-CM | POA: Diagnosis not present

## 2019-06-30 DIAGNOSIS — R32 Unspecified urinary incontinence: Secondary | ICD-10-CM | POA: Diagnosis not present

## 2019-06-30 DIAGNOSIS — I4891 Unspecified atrial fibrillation: Secondary | ICD-10-CM | POA: Diagnosis not present

## 2019-06-30 DIAGNOSIS — J9610 Chronic respiratory failure, unspecified whether with hypoxia or hypercapnia: Secondary | ICD-10-CM | POA: Diagnosis not present

## 2019-06-30 DIAGNOSIS — Z515 Encounter for palliative care: Secondary | ICD-10-CM | POA: Diagnosis not present

## 2019-06-30 DIAGNOSIS — I509 Heart failure, unspecified: Secondary | ICD-10-CM | POA: Diagnosis not present

## 2019-06-30 DIAGNOSIS — E2749 Other adrenocortical insufficiency: Secondary | ICD-10-CM | POA: Diagnosis not present

## 2019-06-30 DIAGNOSIS — I10 Essential (primary) hypertension: Secondary | ICD-10-CM | POA: Diagnosis not present

## 2019-07-01 ENCOUNTER — Emergency Department (HOSPITAL_COMMUNITY): Payer: BC Managed Care – PPO

## 2019-07-01 ENCOUNTER — Other Ambulatory Visit: Payer: Self-pay

## 2019-07-01 ENCOUNTER — Inpatient Hospital Stay (HOSPITAL_COMMUNITY)
Admission: EM | Admit: 2019-07-01 | Discharge: 2019-07-05 | DRG: 871 | Disposition: A | Discharge status: Home Health | Payer: BC Managed Care – PPO | Attending: Internal Medicine | Admitting: Internal Medicine

## 2019-07-01 ENCOUNTER — Encounter (HOSPITAL_COMMUNITY): Payer: Self-pay | Admitting: Emergency Medicine

## 2019-07-01 DIAGNOSIS — Z7952 Long term (current) use of systemic steroids: Secondary | ICD-10-CM

## 2019-07-01 DIAGNOSIS — R0902 Hypoxemia: Secondary | ICD-10-CM | POA: Diagnosis not present

## 2019-07-01 DIAGNOSIS — B961 Klebsiella pneumoniae [K. pneumoniae] as the cause of diseases classified elsewhere: Secondary | ICD-10-CM | POA: Diagnosis present

## 2019-07-01 DIAGNOSIS — Z7901 Long term (current) use of anticoagulants: Secondary | ICD-10-CM | POA: Diagnosis not present

## 2019-07-01 DIAGNOSIS — L03115 Cellulitis of right lower limb: Secondary | ICD-10-CM | POA: Diagnosis present

## 2019-07-01 DIAGNOSIS — R9431 Abnormal electrocardiogram [ECG] [EKG]: Secondary | ICD-10-CM | POA: Diagnosis not present

## 2019-07-01 DIAGNOSIS — R0689 Other abnormalities of breathing: Secondary | ICD-10-CM | POA: Diagnosis not present

## 2019-07-01 DIAGNOSIS — E2740 Unspecified adrenocortical insufficiency: Secondary | ICD-10-CM | POA: Diagnosis not present

## 2019-07-01 DIAGNOSIS — I10 Essential (primary) hypertension: Secondary | ICD-10-CM | POA: Diagnosis not present

## 2019-07-01 DIAGNOSIS — Y95 Nosocomial condition: Secondary | ICD-10-CM | POA: Diagnosis present

## 2019-07-01 DIAGNOSIS — Z515 Encounter for palliative care: Secondary | ICD-10-CM | POA: Diagnosis not present

## 2019-07-01 DIAGNOSIS — Z885 Allergy status to narcotic agent status: Secondary | ICD-10-CM

## 2019-07-01 DIAGNOSIS — Z79899 Other long term (current) drug therapy: Secondary | ICD-10-CM

## 2019-07-01 DIAGNOSIS — Z8249 Family history of ischemic heart disease and other diseases of the circulatory system: Secondary | ICD-10-CM

## 2019-07-01 DIAGNOSIS — L0390 Cellulitis, unspecified: Secondary | ICD-10-CM | POA: Diagnosis present

## 2019-07-01 DIAGNOSIS — A419 Sepsis, unspecified organism: Principal | ICD-10-CM | POA: Diagnosis present

## 2019-07-01 DIAGNOSIS — L03119 Cellulitis of unspecified part of limb: Secondary | ICD-10-CM | POA: Diagnosis not present

## 2019-07-01 DIAGNOSIS — Z86718 Personal history of other venous thrombosis and embolism: Secondary | ICD-10-CM | POA: Diagnosis not present

## 2019-07-01 DIAGNOSIS — K219 Gastro-esophageal reflux disease without esophagitis: Secondary | ICD-10-CM | POA: Diagnosis present

## 2019-07-01 DIAGNOSIS — I480 Paroxysmal atrial fibrillation: Secondary | ICD-10-CM | POA: Diagnosis present

## 2019-07-01 DIAGNOSIS — Z20828 Contact with and (suspected) exposure to other viral communicable diseases: Secondary | ICD-10-CM | POA: Diagnosis not present

## 2019-07-01 DIAGNOSIS — M549 Dorsalgia, unspecified: Secondary | ICD-10-CM | POA: Diagnosis present

## 2019-07-01 DIAGNOSIS — J189 Pneumonia, unspecified organism: Secondary | ICD-10-CM | POA: Diagnosis present

## 2019-07-01 DIAGNOSIS — Z882 Allergy status to sulfonamides status: Secondary | ICD-10-CM | POA: Diagnosis not present

## 2019-07-01 DIAGNOSIS — Z880 Allergy status to penicillin: Secondary | ICD-10-CM | POA: Diagnosis not present

## 2019-07-01 DIAGNOSIS — G8929 Other chronic pain: Secondary | ICD-10-CM | POA: Diagnosis present

## 2019-07-01 DIAGNOSIS — Z833 Family history of diabetes mellitus: Secondary | ICD-10-CM

## 2019-07-01 DIAGNOSIS — E872 Acidosis: Secondary | ICD-10-CM | POA: Diagnosis present

## 2019-07-01 DIAGNOSIS — R52 Pain, unspecified: Secondary | ICD-10-CM | POA: Diagnosis not present

## 2019-07-01 DIAGNOSIS — Z66 Do not resuscitate: Secondary | ICD-10-CM | POA: Diagnosis not present

## 2019-07-01 DIAGNOSIS — J9610 Chronic respiratory failure, unspecified whether with hypoxia or hypercapnia: Secondary | ICD-10-CM | POA: Diagnosis not present

## 2019-07-01 DIAGNOSIS — R32 Unspecified urinary incontinence: Secondary | ICD-10-CM | POA: Diagnosis not present

## 2019-07-01 DIAGNOSIS — N390 Urinary tract infection, site not specified: Secondary | ICD-10-CM | POA: Diagnosis not present

## 2019-07-01 DIAGNOSIS — I509 Heart failure, unspecified: Secondary | ICD-10-CM | POA: Diagnosis not present

## 2019-07-01 DIAGNOSIS — I4891 Unspecified atrial fibrillation: Secondary | ICD-10-CM | POA: Diagnosis not present

## 2019-07-01 DIAGNOSIS — R0989 Other specified symptoms and signs involving the circulatory and respiratory systems: Secondary | ICD-10-CM | POA: Diagnosis not present

## 2019-07-01 DIAGNOSIS — J90 Pleural effusion, not elsewhere classified: Secondary | ICD-10-CM | POA: Diagnosis not present

## 2019-07-01 DIAGNOSIS — R159 Full incontinence of feces: Secondary | ICD-10-CM | POA: Diagnosis not present

## 2019-07-01 DIAGNOSIS — E2749 Other adrenocortical insufficiency: Secondary | ICD-10-CM | POA: Diagnosis not present

## 2019-07-01 LAB — CBC WITH DIFFERENTIAL/PLATELET
Abs Immature Granulocytes: 0.74 10*3/uL — ABNORMAL HIGH (ref 0.00–0.07)
Basophils Absolute: 0.1 10*3/uL (ref 0.0–0.1)
Basophils Relative: 1 %
Eosinophils Absolute: 0.1 10*3/uL (ref 0.0–0.5)
Eosinophils Relative: 0 %
HCT: 46.8 % — ABNORMAL HIGH (ref 36.0–46.0)
Hemoglobin: 14.1 g/dL (ref 12.0–15.0)
Immature Granulocytes: 4 %
Lymphocytes Relative: 3 %
Lymphs Abs: 0.5 10*3/uL — ABNORMAL LOW (ref 0.7–4.0)
MCH: 29.7 pg (ref 26.0–34.0)
MCHC: 30.1 g/dL (ref 30.0–36.0)
MCV: 98.7 fL (ref 80.0–100.0)
Monocytes Absolute: 1 10*3/uL (ref 0.1–1.0)
Monocytes Relative: 5 %
Neutro Abs: 18 10*3/uL — ABNORMAL HIGH (ref 1.7–7.7)
Neutrophils Relative %: 87 %
Platelets: 266 10*3/uL (ref 150–400)
RBC: 4.74 MIL/uL (ref 3.87–5.11)
RDW: 15.9 % — ABNORMAL HIGH (ref 11.5–15.5)
WBC: 20.4 10*3/uL — ABNORMAL HIGH (ref 4.0–10.5)
nRBC: 0.1 % (ref 0.0–0.2)

## 2019-07-01 MED ORDER — FENTANYL CITRATE (PF) 100 MCG/2ML IJ SOLN
50.0000 ug | INTRAMUSCULAR | Status: AC | PRN
  Administered 2019-07-01 – 2019-07-02 (×3): 50 ug via INTRAVENOUS
  Filled 2019-07-01 (×3): qty 2

## 2019-07-01 MED ORDER — VANCOMYCIN HCL IN DEXTROSE 1-5 GM/200ML-% IV SOLN
1000.0000 mg | INTRAVENOUS | Status: AC
  Administered 2019-07-02: 1000 mg via INTRAVENOUS
  Filled 2019-07-01: qty 200

## 2019-07-01 MED ORDER — SODIUM CHLORIDE 0.9 % IV BOLUS (SEPSIS)
1000.0000 mL | Freq: Once | INTRAVENOUS | Status: AC
  Administered 2019-07-01: 1000 mL via INTRAVENOUS

## 2019-07-01 MED ORDER — SODIUM CHLORIDE 0.9 % IV SOLN
1.0000 g | Freq: Every day | INTRAVENOUS | Status: DC
  Administered 2019-07-01: 1 g via INTRAVENOUS
  Filled 2019-07-01: qty 10

## 2019-07-01 MED ORDER — VANCOMYCIN HCL IN DEXTROSE 750-5 MG/150ML-% IV SOLN
750.0000 mg | INTRAVENOUS | Status: DC
  Administered 2019-07-02: 750 mg via INTRAVENOUS
  Filled 2019-07-01: qty 150

## 2019-07-01 MED ORDER — ONDANSETRON HCL 4 MG/2ML IJ SOLN
4.0000 mg | Freq: Once | INTRAMUSCULAR | Status: AC
  Administered 2019-07-01: 4 mg via INTRAVENOUS
  Filled 2019-07-01: qty 2

## 2019-07-01 NOTE — ED Provider Notes (Signed)
[facility] EMERGENCY DEPARTMENT Provider Note   CSN: [id number] Arrival date & time: 07/01/19  2211     History   Chief Complaint Chief Complaint  Patient presents with  . Tachycardia    HPI Betty Bryan is a 63 y.o. female.     HPI   She presents for evaluation of tender swollen right lower leg, which started today.  No known trauma.  She also notes that her heart rate was fast today.  She feels somewhat more short of breath than usual.  She denies known Covid-19 exposure.  She has a history of cellulitis.  She denies cough, nausea, vomiting, focal weakness or paresthesia.  She has chronic abdominal distention.  There are no other known modifying factors.  Past Medical History:  Diagnosis Date  . Adrenal insufficiency ([place])   . HTN (hypertension)   . L4 vertebral fracture (HCC)    L4-L5 fracture  . Palpitations   . Paroxysmal A-fib ([place])   . PVC's (premature ventricular contractions)   . Ulcerative colitis     Patient Active Problem List   Diagnosis Date Noted  . Compression fracture of vertebrae, non-traumatic ([place]) 06/14/2019  . DNR (do not resuscitate) discussion   . Encounter for hospice care discussion   . Vertebral fracture, osteoporotic ([place]) 06/12/2019  . Uncontrolled pain 06/12/2019  . Sepsis due to pneumonia ([place]) 05/20/2019  . Cellulitis 05/20/2019  . Adrenal insufficiency ([place]) 05/20/2019  . Palliative care by specialist   . Pain of left lower extremity   . Acute on chronic respiratory failure with hypoxia ([place]) 05/09/2019  . Lobar pneumonia ([place]) 05/09/2019  . Sepsis due to undetermined organism ([place]) 05/09/2019  . Goals of care, counseling/discussion 05/09/2019  . Pressure injury of skin 05/09/2019  . Sepsis ([place]) 05/08/2019  . Tachycardia 05/08/2019  . Pleural effusion, left 05/08/2019  . Essential hypertension 04/16/2019  . PVC's (premature ventricular contractions) 04/16/2019  . History of DVT (deep vein thrombosis) 04/16/2019  . Chronic  respiratory failure with hypoxia ([place]) 01/29/2019  . AF (paroxysmal atrial fibrillation) ([place]) 01/29/2019  . Hypomagnesemia   . Swelling of both lower extremities   . Hypokalemia 01/28/2019  . Pain 12/23/2018  . Fall at home, initial encounter 12/23/2018  . Multiple rib fractures 12/23/2018  . Clavicle fracture 12/23/2018  . CAP (community acquired pneumonia) 12/23/2018  . Rib pain on right side 01/05/2018  . Varicose veins of leg with complications 0000000  . Chronic venous insufficiency 06/23/2015    Past Surgical History:  Procedure Laterality Date  . HAND SURGERY    . NASAL SINUS SURGERY       OB History   No obstetric history on file.      Home Medications    Prior to Admission medications   Medication Sig Start Date End Date Taking? Authorizing Provider  atenolol (TENORMIN) 25 MG tablet Take 1 tablet (25 mg total) by mouth every 8 (eight) hours. 05/23/19   Aline August, MD  balsalazide (COLAZAL) 750 MG capsule Take 2,250 mg by mouth 3 (three) times daily.    [provider]  calcium-vitamin D (OSCAL WITH D) 500-200 MG-UNIT tablet Take 1 tablet by mouth daily. 05/23/19   Aline August, MD  collagenase (SANTYL) ointment Apply topically daily. Apply to left leg ulcers daily. Patient not taking: Reported on 06/12/2019 05/24/19   Aline August, MD  docusate sodium (COLACE) 100 MG capsule Take 1 capsule (100 mg total) by mouth 2 (two) times daily. Patient not taking: Reported  on 06/12/2019 05/23/19   Aline August, MD  ELIQUIS 2.5 MG TABS tablet Take 2.5 mg by mouth 2 (two) times daily.  04/28/18   [provider]  fluticasone (FLONASE) 50 MCG/ACT nasal spray Place 1 spray into both nostrils 2 (two) times daily.     [provider]  furosemide (LASIX) 20 MG tablet Take 1 tablet (20 mg total) by mouth daily. Patient not taking: Reported on 06/12/2019 05/23/19   Aline August, MD  gabapentin (NEURONTIN) 100 MG capsule Take 100-300 mg by mouth 3  (three) times daily as needed (for nerve pain).     [provider]  HYDROCORTISONE ACE, RECTAL, 30 MG SUPP Place 1 suppository rectally at bedtime.  06/08/15   [provider]  loratadine (CLARITIN) 10 MG tablet Take 10 mg by mouth daily.    [provider]  nystatin (MYCOSTATIN/NYSTOP) powder Apply 1 application topically 3 (three) times daily.  05/08/18   [provider]  ondansetron (ZOFRAN) 4 MG tablet Take 1 tablet (4 mg total) by mouth every 6 (six) hours as needed for nausea. 05/23/19   Aline August, MD  oxyCODONE-acetaminophen (PERCOCET/ROXICET) 5-325 MG tablet Take 1-2 tablets by mouth every 6 (six) hours as needed for moderate pain or severe pain. 12/31/18   Manuella Ghazi, Pratik D, DO  pantoprazole (PROTONIX) 20 MG tablet Take 20 mg by mouth daily.    [provider]  polyethylene glycol (MIRALAX / GLYCOLAX) 17 g packet Take 17 g by mouth daily as needed for mild constipation. Patient not taking: Reported on 06/12/2019 05/23/19   Aline August, MD  potassium chloride SA (K-DUR) 20 MEQ tablet Take 2 tablets (40 mEq total) by mouth daily for 3 days. 06/17/19 06/20/19  Manuella Ghazi, Pratik D, DO  predniSONE (DELTASONE) 5 MG tablet Take 7.5-8 mg by mouth daily. As directed by your physician 05/05/19   [provider]    Family History Family History  Problem Relation Age of Onset  . Heart failure Other   . Diabetes Father   . Heart disease Father   . Hypertension Father     Social History Social History   Tobacco Use  . Smoking status: Never Smoker  . Smokeless tobacco: Never Used  Substance Use Topics  . Alcohol use: No    Alcohol/week: 0.0 standard drinks  . Drug use: No     Allergies   Penicillins, Sulfa antibiotics, Morphine and related, and Voltaren [diclofenac]   Review of Systems Review of Systems   Physical Exam Updated Vital Signs BP (!) 161/78 (BP Location: Left Arm)   Pulse (!) 133   Temp (!) 100.4 F (38 C) (Oral)    Resp (!) 23   Ht 5' (1.524 m)   Wt 58.1 kg   SpO2 99%   BMI 25.00 kg/m   Physical Exam   ED Treatments / Results  Labs (all labs ordered are listed, but only abnormal results are displayed) Labs Reviewed - No data to display  EKG None  Radiology No results found.  Procedures .Critical Care Performed by: Daleen Bo, MD Authorized by: Daleen Bo, MD   Critical care provider statement:    Critical care time (minutes):  35   Critical care start time:  07/01/2019 10:25 PM   Critical care end time:  07/01/2019 11:57 PM   Critical care time was exclusive of:  Separately billable procedures and treating other patients   Critical care was necessary to treat or prevent imminent or life-threatening deterioration of the  following conditions:  Circulatory failure   Critical care was time spent personally by me on the following activities:  Blood draw for specimens, development of treatment plan with patient or surrogate, discussions with consultants, evaluation of patient's response to treatment, examination of patient, obtaining history from patient or surrogate, ordering and performing treatments and interventions, ordering and review of laboratory studies, pulse oximetry, re-evaluation of patient's condition, review of old charts and ordering and review of radiographic studies   (including critical care time)  Medications Ordered in ED Medications - No data to display   Initial Impression / Assessment and Plan / ED Course  I have reviewed the triage vital signs and the nursing notes.  Pertinent labs & imaging results that were available during my care of the patient were reviewed by me and considered in my medical decision making (see chart for details).  Clinical Course as of Jun 30 2352  Thu Jul 01, 2019  2246 I discussed the case with the on-call cardiologist, Dr. Marletta Lor.  She states that she would not activate code STEMI at this time, and recommends evaluation and  treatment, with repeat EKG at time of second troponin.   [EW]    Clinical Course User Index [EW] Daleen Bo, MD        Patient Vitals for the past 24 hrs:  BP Temp Temp src Pulse Resp SpO2 Height Weight  07/01/19 2230 (!) 161/115 - - (!) 132 20 100 % - -  07/01/19 2225 - (!) 100.4 F (38 C) Oral - - - - -  07/01/19 2217 (!) 161/78 100.2 F (37.9 C) Oral (!) 133 (!) 23 99 % - -  07/01/19 2215 - - - - - - 5' (1.524 m) 58.1 kg  07/01/19 2212 - - - - - 90 % - -      Medical Decision Making: Patient presenting with cellulitis of left lower leg, with history of adrenal insufficiency, and ulcerative colitis.  Mildly tachycardic on arrival with abnormal EKG, not indicative of STEMI.  Empiric antibiotics ordered.  IV fluid boluses ordered.  Consultation with cardiology regarding abnormal EKG.  Patient will likely require admission therefore COVID test ordered.  ABISOLA PFEFFER was evaluated in Emergency Department on 07/01/2019 for the symptoms described in the history of present illness. She was evaluated in the context of the global COVID-19 pandemic, which necessitated consideration that the patient might be at risk for infection with the SARS-CoV-2 virus that causes COVID-19. Institutional protocols and algorithms that pertain to the evaluation of patients at risk for COVID-19 are in a state of rapid change based on information released by regulatory bodies including the CDC and federal and state organizations. These policies and algorithms were followed during the patient's care in the ED.  CRITICAL CARE-yes Performed by: Daleen Bo  Nursing Notes Reviewed/ Care Coordinated Applicable Imaging Reviewed Interpretation of Laboratory Data incorporated into ED treatment  Plan-as per oncoming provider team, after return of testing results and treatment complete.  Final Clinical Impressions(s) / ED Diagnoses   Final diagnoses:  Cellulitis of right lower leg  Sepsis, due to  unspecified organism, unspecified whether acute organ dysfunction present [facility])  Abnormal EKG    ED Discharge Orders    None       Daleen Bo, MD 07/01/19 2358

## 2019-07-01 NOTE — Progress Notes (Addendum)
Pharmacy Antibiotic Note  Betty Bryan is a 63 y.o. female admitted on 07/01/2019 with cellulitis.  Pharmacy has been consulted for Vancomycin and Levaquin dosing.   Per consult, if history of intolerance, mild allergy, or documented history of use of cephalosporins, pharmacy can adjust levofloxacin to ceftriaxone. Pt tolerated cefepime during 04/2019 admission,.  Plan: Ceftriaxone 1gm IV q24h Vancomycin 1000mg  IV now then 750 mg IV Q 24 hrs. Goal AUC 400-550. Expected AUC: 460 SCr used: 1 Will f/u renal function, micro data, and pt's clinical condition Vanc levels prn  Addendum: Ceftriaxone broadened to Cefepime by admitting MD for possible HCAP/sepsis/cellulitis.  Cefepime 2gm IV q12h   Height: 5' (152.4 cm) Weight: 128 lb (58.1 kg) IBW/kg (Calculated) : 45.5  Temp (24hrs), Avg:100.3 F (37.9 C), Min:100.2 F (37.9 C), Max:100.4 F (38 C)  No results for input(s): WBC, CREATININE, LATICACIDVEN, VANCOTROUGH, VANCOPEAK, VANCORANDOM, GENTTROUGH, GENTPEAK, GENTRANDOM, TOBRATROUGH, TOBRAPEAK, TOBRARND, AMIKACINPEAK, AMIKACINTROU, AMIKACIN in the last 168 hours.  Estimated Creatinine Clearance: 58.1 mL/min (by C-G formula based on SCr of 0.46 mg/dL).    Allergies  Allergen Reactions  . Penicillins Anaphylaxis    Has patient had a PCN reaction causing immediate rash, facial/tongue/throat swelling, SOB or lightheadedness with hypotension: Yes Has patient had a PCN reaction causing severe rash involving mucus membranes or skin necrosis: Yes Has patient had a PCN reaction that required hospitalization: No Has patient had a PCN reaction occurring within the last 10 years: No Tolerate cefepime 05/21/19   . Sulfa Antibiotics Anaphylaxis  . Morphine And Related Nausea And Vomiting  . Voltaren [Diclofenac]     Chest pains - Voltaren Gel     Antimicrobials this admission: 10/8 Vanc >>  10/8 Ceftriaxonex1 10/9 Cefepime>>   Dose adjustments this admission:  Microbiology  results:  BCx:   UCx:  Thank you for allowing pharmacy to be a part of this patient's care.  Sherlon Handing, PharmD, BCPS 07/01/2019 10:41 PM

## 2019-07-01 NOTE — ED Provider Notes (Signed)
11:52 PM Assumed care from Dr. Eulis Foster, please see their note for full history, physical and decision making until this point. In brief this is a 63 y.o. year old female who presented to the ED tonight with Tachycardia     Here with presumed sepsis secondary to cellulitis.  On my evaluation patient she also states that she is supposed to be on Eliquis and stopped that recently prior to her leg being swollen.  She does have a history of DVT.  She also states precipitate nausea, increased shortness of breath and increased oxygen requirement. Her heart rate seems to be improved over previous.  She is Artie had a proper antibiotics, fluids lactic acid blood cultures ordered.  They were having difficulty with IV access. Labs returned with a leukocytosis, elevated lactic acid.  Once again all appropriate antibiotics and fluids have been ordered.  Patient is DO NOT RESUSCITATE order in place.  She would like her brother and sister to make decisions for her if needed. Will hold on reinitiating anticoagulation until discusion with admitting team. ecg abnormal, see Dr. Eulis Foster' note for cardiology discussion. Discussed with Dr. Maudie Mercury for admission.   Emergency Ultrasound Study:   Angiocath insertion Performed by: Merrily Pew  Consent: Verbal consent obtained. Risks and benefits: risks, benefits and alternatives were discussed Immediately prior to procedure the correct patient, procedure, equipment, support staff and site/side marked as needed.  Indication: difficult IV access Preparation: Patient was prepped and draped in the usual sterile fashion. Vein Location: right basilic vein was visualized during assessment for potential access sites and was found to be patent/ easily compressed with linear ultrasound.  The needle was visualized with real-time ultrasound and guided into the vein. Gauge: 20  Image not saved and stored.  Normal blood return.  Patient tolerance: Patient tolerated the procedure well with  no immediate complications.     Discharge instructions, including strict return precautions for new or worsening symptoms, given. Patient and/or family verbalized understanding and agreement with the plan as described.   Labs, studies and imaging reviewed by myself and considered in medical decision making if ordered. Imaging interpreted by radiology.  Labs Reviewed  CBC WITH DIFFERENTIAL/PLATELET - Abnormal; Notable for the following components:      Result Value   WBC 20.4 (*)    HCT 46.8 (*)    RDW 15.9 (*)    Neutro Abs 18.0 (*)    Lymphs Abs 0.5 (*)    Abs Immature Granulocytes 0.74 (*)    All other components within normal limits  CULTURE, BLOOD (ROUTINE X 2)  CULTURE, BLOOD (ROUTINE X 2)  URINE CULTURE  COMPREHENSIVE METABOLIC PANEL  URINALYSIS, ROUTINE W REFLEX MICROSCOPIC  LACTIC ACID, PLASMA  BRAIN NATRIURETIC PEPTIDE  TROPONIN I (HIGH SENSITIVITY)    DG Chest Port 1 View    (Results Pending)    No follow-ups on file.    Shuntae Herzig, Corene Cornea, MD 07/02/19 [phone]

## 2019-07-01 NOTE — ED Triage Notes (Signed)
Pt arrives with [facility] c/o of increased HR, and some slight shortness of breath that is worse than her usual. Pt has noted redness and swelling to left lower leg and foot. Hx of cellulitis.

## 2019-07-02 ENCOUNTER — Encounter (HOSPITAL_COMMUNITY): Payer: Self-pay | Admitting: Internal Medicine

## 2019-07-02 ENCOUNTER — Inpatient Hospital Stay (HOSPITAL_COMMUNITY): Payer: BC Managed Care – PPO

## 2019-07-02 DIAGNOSIS — I480 Paroxysmal atrial fibrillation: Secondary | ICD-10-CM | POA: Diagnosis present

## 2019-07-02 DIAGNOSIS — Z7901 Long term (current) use of anticoagulants: Secondary | ICD-10-CM | POA: Diagnosis not present

## 2019-07-02 DIAGNOSIS — Z20828 Contact with and (suspected) exposure to other viral communicable diseases: Secondary | ICD-10-CM | POA: Diagnosis present

## 2019-07-02 DIAGNOSIS — Z882 Allergy status to sulfonamides status: Secondary | ICD-10-CM | POA: Diagnosis not present

## 2019-07-02 DIAGNOSIS — L03119 Cellulitis of unspecified part of limb: Secondary | ICD-10-CM

## 2019-07-02 DIAGNOSIS — Z833 Family history of diabetes mellitus: Secondary | ICD-10-CM | POA: Diagnosis not present

## 2019-07-02 DIAGNOSIS — Y95 Nosocomial condition: Secondary | ICD-10-CM | POA: Diagnosis present

## 2019-07-02 DIAGNOSIS — L03115 Cellulitis of right lower limb: Secondary | ICD-10-CM | POA: Diagnosis present

## 2019-07-02 DIAGNOSIS — E2740 Unspecified adrenocortical insufficiency: Secondary | ICD-10-CM

## 2019-07-02 DIAGNOSIS — Z880 Allergy status to penicillin: Secondary | ICD-10-CM | POA: Diagnosis not present

## 2019-07-02 DIAGNOSIS — R9431 Abnormal electrocardiogram [ECG] [EKG]: Secondary | ICD-10-CM | POA: Diagnosis present

## 2019-07-02 DIAGNOSIS — M549 Dorsalgia, unspecified: Secondary | ICD-10-CM | POA: Diagnosis present

## 2019-07-02 DIAGNOSIS — Z7952 Long term (current) use of systemic steroids: Secondary | ICD-10-CM | POA: Diagnosis not present

## 2019-07-02 DIAGNOSIS — I10 Essential (primary) hypertension: Secondary | ICD-10-CM | POA: Diagnosis present

## 2019-07-02 DIAGNOSIS — G8929 Other chronic pain: Secondary | ICD-10-CM | POA: Diagnosis present

## 2019-07-02 DIAGNOSIS — Z79899 Other long term (current) drug therapy: Secondary | ICD-10-CM | POA: Diagnosis not present

## 2019-07-02 DIAGNOSIS — Z86718 Personal history of other venous thrombosis and embolism: Secondary | ICD-10-CM | POA: Diagnosis not present

## 2019-07-02 DIAGNOSIS — Z66 Do not resuscitate: Secondary | ICD-10-CM | POA: Diagnosis present

## 2019-07-02 DIAGNOSIS — J189 Pneumonia, unspecified organism: Secondary | ICD-10-CM | POA: Diagnosis present

## 2019-07-02 DIAGNOSIS — A419 Sepsis, unspecified organism: Secondary | ICD-10-CM | POA: Diagnosis present

## 2019-07-02 DIAGNOSIS — B961 Klebsiella pneumoniae [K. pneumoniae] as the cause of diseases classified elsewhere: Secondary | ICD-10-CM | POA: Diagnosis present

## 2019-07-02 DIAGNOSIS — E872 Acidosis: Secondary | ICD-10-CM | POA: Diagnosis present

## 2019-07-02 DIAGNOSIS — N390 Urinary tract infection, site not specified: Secondary | ICD-10-CM | POA: Diagnosis present

## 2019-07-02 DIAGNOSIS — Z885 Allergy status to narcotic agent status: Secondary | ICD-10-CM | POA: Diagnosis not present

## 2019-07-02 DIAGNOSIS — Z8249 Family history of ischemic heart disease and other diseases of the circulatory system: Secondary | ICD-10-CM | POA: Diagnosis not present

## 2019-07-02 DIAGNOSIS — K219 Gastro-esophageal reflux disease without esophagitis: Secondary | ICD-10-CM | POA: Diagnosis present

## 2019-07-02 LAB — COMPREHENSIVE METABOLIC PANEL
ALT: 21 U/L (ref 0–44)
ALT: 18 U/L (ref 0–44)
AST: 28 U/L (ref 15–41)
AST: 22 U/L (ref 15–41)
Albumin: 3.2 g/dL — ABNORMAL LOW (ref 3.5–5.0)
Albumin: 2.3 g/dL — ABNORMAL LOW (ref 3.5–5.0)
Alkaline Phosphatase: 158 U/L — ABNORMAL HIGH (ref 38–126)
Alkaline Phosphatase: 114 U/L (ref 38–126)
Anion gap: 12 (ref 5–15)
Anion gap: 12 (ref 5–15)
BUN: 10 mg/dL (ref 8–23)
BUN: 11 mg/dL (ref 8–23)
CO2: 29 mmol/L (ref 22–32)
CO2: 24 mmol/L (ref 22–32)
Calcium: 8.4 mg/dL — ABNORMAL LOW (ref 8.9–10.3)
Calcium: 7.4 mg/dL — ABNORMAL LOW (ref 8.9–10.3)
Chloride: 100 mmol/L (ref 98–111)
Chloride: 103 mmol/L (ref 98–111)
Creatinine, Ser: 0.56 mg/dL (ref 0.44–1.00)
Creatinine, Ser: 0.54 mg/dL (ref 0.44–1.00)
GFR calc Af Amer: >60 mL/min (ref >60)
GFR calc Af Amer: >60 mL/min (ref >60)
GFR calc non Af Amer: >60 mL/min (ref >60)
GFR calc non Af Amer: >60 mL/min (ref >60)
Glucose, Bld: 104 mg/dL — ABNORMAL HIGH (ref 70–99)
Glucose, Bld: 114 mg/dL — ABNORMAL HIGH (ref 70–99)
Potassium: 3.6 mmol/L (ref 3.5–5.1)
Potassium: 2.6 mmol/L — CL (ref 3.5–5.1)
Sodium: 141 mmol/L (ref 135–145)
Sodium: 139 mmol/L (ref 135–145)
Total Bilirubin: 0.6 mg/dL (ref 0.3–1.2)
Total Bilirubin: 0.6 mg/dL (ref 0.3–1.2)
Total Protein: 6.1 g/dL — ABNORMAL LOW (ref 6.5–8.1)
Total Protein: 4.6 g/dL — ABNORMAL LOW (ref 6.5–8.1)

## 2019-07-02 LAB — BASIC METABOLIC PANEL
Anion gap: 13 (ref 5–15)
BUN: 11 mg/dL (ref 8–23)
CO2: 21 mmol/L — ABNORMAL LOW (ref 22–32)
Calcium: 7.5 mg/dL — ABNORMAL LOW (ref 8.9–10.3)
Chloride: 105 mmol/L (ref 98–111)
Creatinine, Ser: 0.56 mg/dL (ref 0.44–1.00)
GFR calc Af Amer: >60 mL/min (ref >60)
GFR calc non Af Amer: >60 mL/min (ref >60)
Glucose, Bld: 103 mg/dL — ABNORMAL HIGH (ref 70–99)
Potassium: 3.7 mmol/L (ref 3.5–5.1)
Sodium: 139 mmol/L (ref 135–145)

## 2019-07-02 LAB — CBC
HCT: 37.7 % (ref 36.0–46.0)
Hemoglobin: 11.4 g/dL — ABNORMAL LOW (ref 12.0–15.0)
MCH: 29.8 pg (ref 26.0–34.0)
MCHC: 30.2 g/dL (ref 30.0–36.0)
MCV: 98.4 fL (ref 80.0–100.0)
Platelets: 204 10*3/uL (ref 150–400)
RBC: 3.83 MIL/uL — ABNORMAL LOW (ref 3.87–5.11)
RDW: 15.9 % — ABNORMAL HIGH (ref 11.5–15.5)
WBC: 21.8 10*3/uL — ABNORMAL HIGH (ref 4.0–10.5)
nRBC: 0.3 % — ABNORMAL HIGH (ref 0.0–0.2)

## 2019-07-02 LAB — URINALYSIS, ROUTINE W REFLEX MICROSCOPIC
Bacteria, UA: NONE SEEN
Bilirubin Urine: NEGATIVE
Glucose, UA: NEGATIVE mg/dL
Hgb urine dipstick: NEGATIVE
Ketones, ur: NEGATIVE mg/dL
Leukocytes,Ua: NEGATIVE
Nitrite: POSITIVE — AB
Protein, ur: NEGATIVE mg/dL
Specific Gravity, Urine: 1.008 (ref 1.005–1.030)
pH: 6 (ref 5.0–8.0)

## 2019-07-02 LAB — PROTIME-INR
INR: 1.1 (ref 0.8–1.2)
Prothrombin Time: 13.7 seconds (ref 11.4–15.2)

## 2019-07-02 LAB — MRSA PCR SCREENING: MRSA by PCR: NEGATIVE

## 2019-07-02 LAB — LACTIC ACID, PLASMA
Lactic Acid, Venous: 3.5 mmol/L (ref 0.5–1.9)
Lactic Acid, Venous: 3.6 mmol/L (ref 0.5–1.9)
Lactic Acid, Venous: 4 mmol/L (ref 0.5–1.9)
Lactic Acid, Venous: 5.3 mmol/L (ref 0.5–1.9)

## 2019-07-02 LAB — MAGNESIUM: Magnesium: 1.4 mg/dL — ABNORMAL LOW (ref 1.7–2.4)

## 2019-07-02 LAB — TROPONIN I (HIGH SENSITIVITY)
Troponin I (High Sensitivity): 13 ng/L (ref <18)
Troponin I (High Sensitivity): 13 ng/L (ref <18)

## 2019-07-02 LAB — SARS CORONAVIRUS 2 (TAT 6-24 HRS): SARS Coronavirus 2: NEGATIVE

## 2019-07-02 LAB — BRAIN NATRIURETIC PEPTIDE: B Natriuretic Peptide: 134 pg/mL — ABNORMAL HIGH (ref 0.0–100.0)

## 2019-07-02 MED ORDER — APIXABAN 5 MG PO TABS
5.0000 mg | ORAL_TABLET | Freq: Two times a day (BID) | ORAL | Status: DC
  Administered 2019-07-02 – 2019-07-05 (×7): 5 mg via ORAL
  Filled 2019-07-02 (×7): qty 1

## 2019-07-02 MED ORDER — PANTOPRAZOLE SODIUM 40 MG PO TBEC
20.0000 mg | DELAYED_RELEASE_TABLET | Freq: Every day | ORAL | Status: DC
  Administered 2019-07-02 – 2019-07-05 (×4): 40 mg via ORAL
  Filled 2019-07-02 (×5): qty 1

## 2019-07-02 MED ORDER — BALSALAZIDE DISODIUM 750 MG PO CAPS
2250.0000 mg | ORAL_CAPSULE | Freq: Three times a day (TID) | ORAL | Status: DC
  Administered 2019-07-03: 2250 mg via ORAL
  Filled 2019-07-02 (×12): qty 3

## 2019-07-02 MED ORDER — ATENOLOL 25 MG PO TABS
25.0000 mg | ORAL_TABLET | Freq: Three times a day (TID) | ORAL | Status: DC
  Administered 2019-07-02 – 2019-07-05 (×8): 25 mg via ORAL
  Filled 2019-07-02 (×8): qty 1

## 2019-07-02 MED ORDER — HYDROCORTISONE NA SUCCINATE PF 100 MG IJ SOLR
50.0000 mg | Freq: Four times a day (QID) | INTRAMUSCULAR | Status: DC
  Administered 2019-07-02 – 2019-07-03 (×5): 50 mg via INTRAVENOUS
  Filled 2019-07-02 (×5): qty 2

## 2019-07-02 MED ORDER — SODIUM CHLORIDE 0.9 % IV SOLN
INTRAVENOUS | Status: DC
  Administered 2019-07-02 (×2): via INTRAVENOUS

## 2019-07-02 MED ORDER — FLUTICASONE PROPIONATE 50 MCG/ACT NA SUSP
1.0000 | Freq: Two times a day (BID) | NASAL | Status: DC
  Administered 2019-07-02 – 2019-07-05 (×6): 1 via NASAL
  Filled 2019-07-02 (×2): qty 16

## 2019-07-02 MED ORDER — ACETAMINOPHEN 325 MG PO TABS
650.0000 mg | ORAL_TABLET | Freq: Four times a day (QID) | ORAL | Status: DC | PRN
  Administered 2019-07-02: 650 mg via ORAL
  Filled 2019-07-02: qty 2

## 2019-07-02 MED ORDER — LORATADINE 10 MG PO TABS
10.0000 mg | ORAL_TABLET | Freq: Every day | ORAL | Status: DC
  Administered 2019-07-02 – 2019-07-05 (×4): 10 mg via ORAL
  Filled 2019-07-02 (×4): qty 1

## 2019-07-02 MED ORDER — OXYCODONE-ACETAMINOPHEN 5-325 MG PO TABS
1.0000 | ORAL_TABLET | Freq: Four times a day (QID) | ORAL | Status: DC | PRN
  Administered 2019-07-02: 21:00:00 2 via ORAL
  Administered 2019-07-02 – 2019-07-03 (×2): 1 via ORAL
  Administered 2019-07-03: 2 via ORAL
  Administered 2019-07-03 – 2019-07-04 (×2): 1 via ORAL
  Administered 2019-07-04: 2 via ORAL
  Administered 2019-07-04 – 2019-07-05 (×3): 1 via ORAL
  Filled 2019-07-02: qty 1
  Filled 2019-07-02 (×2): qty 2
  Filled 2019-07-02: qty 1
  Filled 2019-07-02: qty 2
  Filled 2019-07-02 (×2): qty 1
  Filled 2019-07-02: qty 2
  Filled 2019-07-02 (×2): qty 1

## 2019-07-02 MED ORDER — POTASSIUM CHLORIDE CRYS ER 20 MEQ PO TBCR
30.0000 meq | EXTENDED_RELEASE_TABLET | Freq: Two times a day (BID) | ORAL | Status: AC
  Administered 2019-07-02 (×2): 30 meq via ORAL
  Filled 2019-07-02 (×2): qty 1

## 2019-07-02 MED ORDER — POTASSIUM CHLORIDE 10 MEQ/100ML IV SOLN
10.0000 meq | INTRAVENOUS | Status: AC
  Administered 2019-07-02 (×4): 10 meq via INTRAVENOUS
  Filled 2019-07-02 (×4): qty 100

## 2019-07-02 MED ORDER — DIPHENHYDRAMINE HCL 25 MG PO CAPS
25.0000 mg | ORAL_CAPSULE | Freq: Once | ORAL | Status: AC
  Administered 2019-07-02: 25 mg via ORAL
  Filled 2019-07-02: qty 1

## 2019-07-02 MED ORDER — SODIUM CHLORIDE 0.9 % IV SOLN: INTRAVENOUS | Status: DC

## 2019-07-02 MED ORDER — CHLORHEXIDINE GLUCONATE CLOTH 2 % EX PADS
6.0000 | MEDICATED_PAD | Freq: Every day | CUTANEOUS | Status: DC
  Administered 2019-07-02 – 2019-07-05 (×4): 6 via TOPICAL

## 2019-07-02 MED ORDER — ACETAMINOPHEN 500 MG PO TABS
1000.0000 mg | ORAL_TABLET | Freq: Once | ORAL | Status: AC
  Administered 2019-07-02: 1000 mg via ORAL
  Filled 2019-07-02: qty 2

## 2019-07-02 MED ORDER — HYDROCORTISONE ACETATE 25 MG RE SUPP
25.0000 mg | Freq: Every day | RECTAL | Status: DC
  Administered 2019-07-02 – 2019-07-04 (×3): 25 mg via RECTAL
  Filled 2019-07-02 (×3): qty 1

## 2019-07-02 MED ORDER — ORAL CARE MOUTH RINSE
15.0000 mL | Freq: Two times a day (BID) | OROMUCOSAL | Status: DC
  Administered 2019-07-02 – 2019-07-05 (×6): 15 mL via OROMUCOSAL

## 2019-07-02 MED ORDER — ACETAMINOPHEN 650 MG RE SUPP: 650.0000 mg | Freq: Four times a day (QID) | RECTAL | Status: DC | PRN

## 2019-07-02 MED ORDER — SODIUM CHLORIDE 0.9 % IV BOLUS
1000.0000 mL | Freq: Once | INTRAVENOUS | Status: AC
  Administered 2019-07-02: 1000 mL via INTRAVENOUS

## 2019-07-02 MED ORDER — LACTATED RINGERS IV BOLUS
1000.0000 mL | Freq: Once | INTRAVENOUS | Status: AC
  Administered 2019-07-02: 1000 mL via INTRAVENOUS

## 2019-07-02 MED ORDER — SODIUM CHLORIDE 0.9 % IV SOLN
2.0000 g | Freq: Two times a day (BID) | INTRAVENOUS | Status: DC
  Administered 2019-07-02 – 2019-07-04 (×5): 2 g via INTRAVENOUS
  Filled 2019-07-02 (×5): qty 2

## 2019-07-02 NOTE — Progress Notes (Addendum)
Per HPI: Betty Bryan  is a 63 y.o. female, history of adrenal insufficiency,T6, T9 compression fracture,paroxysmal atrial fibrillation, DVT, hypertension, ulcerative colitison chronic prednisone, sepsis 8/15-21 secondary to PNA, and LE cellultiis, and sepsis 8/27-30 secondary to PNA, and cellulitis, 9/19-23 for back pain/ flank pain now presents with c/o redness of the right foot, and tachycardia, and dyspnea.  Pt denies fever, chills, cough, cp, orthopnea, pnd, change in lower ext edema.  Pt notes that her lasix increased to 60mg  po qday by pcp.  Pt is not clear about her last dose of eliquis.    Patient has been admitted with sepsis secondary to right lower extremity cellulitis as well as H CAP.  Neck seen and examined the patient at bedside this morning.  Cultures are pending.  I agree with continuation of vancomycin and cefepime for now.  Will recheck lactic acid to ensure clearance and maintain on aggressive IV fluid.  She is noted to have some hypotension and therefore, will avoid Lasix and continue on hydrocortisone IV every 6 hours for adrenal insufficiency.  Atenolol ordered for atrial fibrillation with holding parameters.  Troponins without any suggestion of MI.  Will check BMP as well as magnesium this afternoon.  Patient appears to have started hospice with home health agency a few days prior.  She does wear 2.5 L nasal cannula at home and is currently on 4 L nasal cannula.  Discussed with patient that in the presence of persistent hypotension she may require central venous access with initiation of pressors.  Discussed the risks and benefits of such a procedure and she states that she would not want to have this done if her blood pressures failed to respond to current treatment.  She states that she would want to focus more on comfort if this became an issue.  Total care time: 30 minutes.

## 2019-07-02 NOTE — Progress Notes (Signed)
CRITICAL VALUE ALERT  Critical Value:  Lactic acid: 4.0  Date & Time Notied:  07/02/19 @ 0841  Provider Notified: Dr. Manuella Ghazi

## 2019-07-02 NOTE — H&P (Signed)
TRH H&P    Patient Demographics:    Betty Bryan, is a 63 y.o. female  MRN: [id number]  DOB - [birth year]  Admit Date - 07/01/2019  Referring MD/NP/PA:  Merrily Pew  Outpatient Primary MD for the patient is Crist Infante, MD [place] - pulmonary  Patient coming from: home  Chief complaint- redness of the right foot, dyspnea,    HPI:    Betty Bryan  is a 63 y.o. female, history of adrenal insufficiency, T6, T9 compression fracture, paroxysmal atrial fibrillation, DVT, hypertension, ulcerative colitis on chronic prednisone, sepsis 8/15-21 secondary to PNA, and LE cellultiis, and sepsis 8/27-30 secondary to PNA, and cellulitis, 9/19-23 for back pain/ flank pain now presents with c/o redness of the right foot, and tachycardia, and dyspnea.  Pt denies fever, chills, cough, cp, orthopnea, pnd, change in lower ext edema.  Pt notes that her lasix increased to 9m po qday by pcp.  Pt is not clear about her last dose of eliquis.    In ED,    Na 141, K 3.6, Bun 10, Creatinine 0.56 Ast 28, Alt 21, Alk phos 158, T. Bili 0.6  Wbc 20.4, Hgb 14.1, Plt 266 BNP 134 Trop 13-> 13 Blood culture x2 pending  CXR IMPRESSION: Moderate left pleural effusion with likely adjacent layering  Patchy/hazy airspace opacity throughout the right lung not significantly changed since the prior exam. Could be due to edema, however cannot exclude an underlying superimposed infection.  EKG ST at 130, nl axis, st depression in 1, 2, avl, and st elevation in Avr, per Dr. MDayna Barker Dr. WEulis Fosterspoke with cardiology and no STEMI.  Recommendation per cardiology to trend troponins per Dr. MDayna Barker   Pt was given vanco iv, rocephin 1gm iv and ns 10028miv x3     Review of systems:    In addition to the HPI above,  No Fever-chills, No Headache, No changes with Vision or hearing, No problems swallowing food or Liquids, No Chest  pain, Cough  No Abdominal pain, No Nausea or Vomiting, bowel movements are regular, No Blood in stool or Urine, No dysuria,  No new joints pains-aches,  No new weakness, tingling, numbness in any extremity, No recent weight gain or loss, No polyuria, polydypsia or polyphagia, No significant Mental Stressors.  All other systems reviewed and are negative.    Past History of the following :    Past Medical History:  Diagnosis Date   Adrenal insufficiency (HCC)    HTN (hypertension)    L4 vertebral fracture (HCC)    L4-L5 fracture   Palpitations    Paroxysmal A-fib (HCC)    PVC's (premature ventricular contractions)    Ulcerative colitis       Past Surgical History:  Procedure Laterality Date   HAND SURGERY     NASAL SINUS SURGERY        Social History:      Social History   Tobacco Use   Smoking status: Never Smoker   Smokeless tobacco: Never Used  Substance Use Topics  Alcohol use: No    Alcohol/week: 0.0 standard drinks       Family History :     Family History  Problem Relation Age of Onset   Heart failure Other    Diabetes Father    Heart disease Father    Hypertension Father        Home Medications:   Prior to Admission medications   Medication Sig Start Date End Date Taking? Authorizing Provider  atenolol (TENORMIN) 25 MG tablet Take 1 tablet (25 mg total) by mouth every 8 (eight) hours. 05/23/19   Aline August, MD  balsalazide (COLAZAL) 750 MG capsule Take 2,250 mg by mouth 3 (three) times daily.    [provider]  calcium-vitamin D (OSCAL WITH D) 500-200 MG-UNIT tablet Take 1 tablet by mouth daily. 05/23/19   Aline August, MD  collagenase (SANTYL) ointment Apply topically daily. Apply to left leg ulcers daily. Patient not taking: Reported on 06/12/2019 05/24/19   Aline August, MD  docusate sodium (COLACE) 100 MG capsule Take 1 capsule (100 mg total) by mouth 2 (two) times daily. Patient not taking: Reported on  06/12/2019 05/23/19   Aline August, MD  ELIQUIS 2.5 MG TABS tablet Take 2.5 mg by mouth 2 (two) times daily.  04/28/18   [provider]  fluticasone (FLONASE) 50 MCG/ACT nasal spray Place 1 spray into both nostrils 2 (two) times daily.     [provider]  furosemide (LASIX) 20 MG tablet Take 1 tablet (20 mg total) by mouth daily. Patient not taking: Reported on 06/12/2019 05/23/19   Aline August, MD  gabapentin (NEURONTIN) 100 MG capsule Take 100-300 mg by mouth 3 (three) times daily as needed (for nerve pain).     [provider]  HYDROCORTISONE ACE, RECTAL, 30 MG SUPP Place 1 suppository rectally at bedtime.  06/08/15   [provider]  loratadine (CLARITIN) 10 MG tablet Take 10 mg by mouth daily.    [provider]  nystatin (MYCOSTATIN/NYSTOP) powder Apply 1 application topically 3 (three) times daily.  05/08/18   [provider]  ondansetron (ZOFRAN) 4 MG tablet Take 1 tablet (4 mg total) by mouth every 6 (six) hours as needed for nausea. 05/23/19   Aline August, MD  oxyCODONE-acetaminophen (PERCOCET/ROXICET) 5-325 MG tablet Take 1-2 tablets by mouth every 6 (six) hours as needed for moderate pain or severe pain. 12/31/18   Manuella Ghazi, Pratik D, DO  pantoprazole (PROTONIX) 20 MG tablet Take 20 mg by mouth daily.    [provider]  polyethylene glycol (MIRALAX / GLYCOLAX) 17 g packet Take 17 g by mouth daily as needed for mild constipation. Patient not taking: Reported on 06/12/2019 05/23/19   Aline August, MD  potassium chloride SA (K-DUR) 20 MEQ tablet Take 2 tablets (40 mEq total) by mouth daily for 3 days. 06/17/19 06/20/19  Manuella Ghazi, Pratik D, DO  predniSONE (DELTASONE) 5 MG tablet Take 7.5-8 mg by mouth daily. As directed by your physician 05/05/19   [provider]     Allergies:     Allergies  Allergen Reactions   Penicillins Anaphylaxis    Has patient had a PCN reaction causing immediate rash, facial/tongue/throat  swelling, SOB or lightheadedness with hypotension: Yes Has patient had a PCN reaction causing severe rash involving mucus membranes or skin necrosis: Yes Has patient had a PCN reaction that required hospitalization: No Has patient had a PCN reaction occurring within the last 10 years: No Tolerate cefepime 05/21/19    Sulfa  Antibiotics Anaphylaxis   Morphine And Related Nausea And Vomiting   Voltaren [Diclofenac]     Chest pains - Voltaren Gel      Physical Exam:   Vitals  Blood pressure 137/89, pulse (!) 122, temperature (!) 100.4 F (38 C), temperature source Oral, resp. rate 20, height 5' (1.524 m), weight 58.1 kg, SpO2 96 %.  1.  General: axoxo3  2. Psychiatric: euthymic  3. Neurologic: cn2-12 intact, reflexes 2+ symmetric, diffuse with no clonus, motor 5/5 in all 4 ext  4. HEENMT:  Anicteric, pupils 1.67m symmetric, direct, consensual, near intact Neck: no jvd  5. Respiratory : Decrease bs at left lung base, slight crackles right lung base, no wheezing,   6. Cardiovascular : Tachycardic s1, s2, no m/g/r  7. Gastrointestinal:  Abd: soft, nt, nd, +bs  8. Skin:  Ext: no c/c/e, redness of the dorsum of the right foot and also the right ankle  9.Musculoskeletal:  Good ROM,  No adenoapthy    Data Review:    CBC Recent Labs  Lab 07/01/19 2308  WBC 20.4*  HGB 14.1  HCT 46.8*  PLT 266  MCV 98.7  MCH 29.7  MCHC 30.1  RDW 15.9*  LYMPHSABS 0.5*  MONOABS 1.0  EOSABS 0.1  BASOSABS 0.1   ------------------------------------------------------------------------------------------------------------------  Results for orders placed or performed during the hospital encounter of 07/01/19 (from the past 48 hour(s))  Troponin I (High Sensitivity)     Status: None   Collection Time: 07/01/19 10:47 PM  Result Value Ref Range   Troponin I (High Sensitivity) 13 <18 ng/L    Comment: (NOTE) Elevated high sensitivity troponin I (hsTnI) values and significant    changes across serial measurements may suggest ACS but many other  chronic and acute conditions are known to elevate hsTnI results.  Refer to the "Links" section for chest pain algorithms and additional  guidance. Performed at [facility] [address] [place] [address]  Brain natriuretic peptide     Status: Abnormal   Collection Time: 07/01/19 10:47 PM  Result Value Ref Range   B Natriuretic Peptide 134.0 (H) 0.0 - 100.0 pg/mL    Comment: Performed at [facility] [address], [place] [address] Comprehensive metabolic panel     Status: Abnormal   Collection Time: 07/01/19 11:08 PM  Result Value Ref Range   Sodium 141 135 - 145 mmol/L   Potassium 3.6 3.5 - 5.1 mmol/L   Chloride 100 98 - 111 mmol/L   CO2 29 22 - 32 mmol/L   Glucose, Bld 104 (H) 70 - 99 mg/dL   BUN 10 8 - 23 mg/dL   Creatinine, Ser 0.56 0.44 - 1.00 mg/dL   Calcium 8.4 (L) 8.9 - 10.3 mg/dL   Total Protein 6.1 (L) 6.5 - 8.1 g/dL   Albumin 3.2 (L) 3.5 - 5.0 g/dL   AST 28 15 - 41 U/L   ALT 21 0 - 44 U/L   Alkaline Phosphatase 158 (H) 38 - 126 U/L   Total Bilirubin 0.6 0.3 - 1.2 mg/dL   GFR calc non Af Amer >60 >60 mL/min   GFR calc Af Amer >60 >60 mL/min   Anion gap 12 5 - 15    Comment: Performed at [facility] [address], [place] [id number] CBC WITH DIFFERENTIAL     Status: Abnormal   Collection Time: 07/01/19 11:08 PM  Result Value Ref Range   WBC 20.4 (H) 4.0 - 10.5  K/uL   RBC 4.74 3.87 - 5.11 MIL/uL   Hemoglobin 14.1 12.0 - 15.0 g/dL   HCT 46.8 (H) 36.0 - 46.0 %   MCV 98.7 80.0 - 100.0 fL   MCH 29.7 26.0 - 34.0 pg   MCHC 30.1 30.0 - 36.0 g/dL   RDW 15.9 (H) 11.5 - 15.5 %   Platelets 266 150 - 400 K/uL   nRBC 0.1 0.0 - 0.2 %   Neutrophils Relative % 87 %   Neutro Abs 18.0 (H) 1.7 - 7.7 K/uL   Lymphocytes Relative 3 %   Lymphs Abs 0.5 (L) 0.7 - 4.0 K/uL   Monocytes Relative 5 %   Monocytes Absolute 1.0 0.1 - 1.0 K/uL   Eosinophils Relative 0 %   Eosinophils  Absolute 0.1 0.0 - 0.5 K/uL   Basophils Relative 1 %   Basophils Absolute 0.1 0.0 - 0.1 K/uL   Immature Granulocytes 4 %   Abs Immature Granulocytes 0.74 (H) 0.00 - 0.07 K/uL    Comment: Performed at [facility], [address], [place], [place] [address]  Lactic acid, plasma     Status: Abnormal   Collection Time: 07/01/19 11:08 PM  Result Value Ref Range   Lactic Acid, Venous 3.5 (HH) 0.5 - 1.9 mmol/L    Comment: CRITICAL RESULT CALLED TO, READ BACK BY AND VERIFIED WITH: C BELTON,RN _0  07/02/19 MKELLY Performed at [facility], [address], [place], [place] [address]   Troponin I (High Sensitivity)     Status: None   Collection Time: 07/02/19 12:43 AM  Result Value Ref Range   Troponin I (High Sensitivity) 13 <18 ng/L    Comment: (NOTE) Elevated high sensitivity troponin I (hsTnI) values and significant  changes across serial measurements may suggest ACS but many other  chronic and acute conditions are known to elevate hsTnI results.  Refer to the "Links" section for chest pain algorithms and additional  guidance. Performed at [facility], [address], [place], [place] [address]     Chemistries  Recent Labs  Lab 07/01/19 2308  NA 141  K 3.6  CL 100  CO2 29  GLUCOSE 104*  BUN 10  CREATININE 0.56  CALCIUM 8.4*  AST 28  ALT 21  ALKPHOS 158*  BILITOT 0.6   ------------------------------------------------------------------------------------------------------------------  ------------------------------------------------------------------------------------------------------------------ GFR: Estimated Creatinine Clearance: 58.1 mL/min (by C-G formula based on SCr of 0.56 mg/dL). Liver Function Tests: Recent Labs  Lab 07/01/19 2308  AST 28  ALT 21  ALKPHOS 158*  BILITOT 0.6  PROT 6.1*  ALBUMIN 3.2*   No results for input(s): LIPASE, AMYLASE in the last 168 hours. No results for input(s): AMMONIA in the last 168 hours. Coagulation Profile: No results  for input(s): INR, PROTIME in the last 168 hours. Cardiac Enzymes: No results for input(s): CKTOTAL, CKMB, CKMBINDEX, TROPONINI in the last 168 hours. BNP (last 3 results) No results for input(s): PROBNP in the last 8760 hours. HbA1C: No results for input(s): HGBA1C in the last 72 hours. CBG: No results for input(s): GLUCAP in the last 168 hours. Lipid Profile: No results for input(s): CHOL, HDL, LDLCALC, TRIG, CHOLHDL, LDLDIRECT in the last 72 hours. Thyroid Function Tests: No results for input(s): TSH, T4TOTAL, FREET4, T3FREE, THYROIDAB in the last 72 hours. Anemia Panel: No results for input(s): VITAMINB12, FOLATE, FERRITIN, TIBC, IRON, RETICCTPCT in the last 72 hours.  --------------------------------------------------------------------------------------------------------------- Urine analysis:    Component Value Date/Time   COLORURINE STRAW (A) 06/12/2019 1920   APPEARANCEUR HAZY (A) 06/12/2019  1920   LABSPEC 1.020 06/12/2019 1920   PHURINE 7.0 06/12/2019 1920   GLUCOSEU NEGATIVE 06/12/2019 1920   HGBUR SMALL (A) 06/12/2019 1920   BILIRUBINUR NEGATIVE 06/12/2019 1920   KETONESUR NEGATIVE 06/12/2019 1920   PROTEINUR NEGATIVE 06/12/2019 1920   NITRITE NEGATIVE 06/12/2019 1920   LEUKOCYTESUR NEGATIVE 06/12/2019 1920      Imaging Results:    Dg Chest Port 1 View  Result Date: 07/02/2019 CLINICAL DATA:  Tachycardia EXAM: PORTABLE CHEST 1 VIEW COMPARISON:  June 12, 2019 FINDINGS: Again noted is lordosis with rotation and low lung volume. A moderate left pleural effusion remains. There is hazy opacity seen within the remainder of the left lung. There is patchy/hazy airspace opacity seen throughout the right lung which does not appear to be significantly changed from the prior exam. IMPRESSION: Moderate left pleural effusion with likely adjacent layering Patchy/hazy airspace opacity throughout the right lung not significantly changed since the prior exam. Could be due to  edema, however cannot exclude an underlying superimposed infection. Electronically Signed   By: Prudencio Pair M.D.   On: 07/02/2019 00:09       Assessment & Plan:    Principal Problem:   Sepsis ([place]) Active Problems:   AF (paroxysmal atrial fibrillation) (HCC)   Cellulitis   Adrenal insufficiency (HCC)  Sepsis (fever, tachycardia, elevated lactic acid, elevated wbc) ddx cellulitis, Hcap Blood culture x2 vanco iv, cefepime iv pharmacy to dose Check cbc in am  Tachycardia Trop I in am Prior TSH wnl Consider check cardiac echo if not resolving or troponin elevated  Cellulitis R foot Blood culture x2 Abx as above  L pleural effusion Check CT chest  Pafib Cont Atenolol 37m po tid (w holding parameters) Elquis pharmacy to dose  Adrenal insufficiency Stop prednisone Hydrocortisone 57miv q6h   Gerd Cont PPI  Chronic back pain, h/o compression fractures Hold Gabapetin Percocet 5/32574mo q6h prn   H/o Edema Hold Lasix since bp soft Can resume in AM if bp   H/o Hypokalemia Check cmp in am  DVT Prophylaxis-   Eliquis, / SCD  AM Labs Ordered, also please review Full Orders  Family Communication: Admission, patients condition and plan of care including tests being ordered have been discussed with the patient  who indicate understanding and agree with the plan and Code Status.  Code Status:  DNR per patient,  [facility]: Based on patients clinical presentation and evaluation of above clinical data, I have made determination that patient meets Inpatient criteria at this time. Pt has sepsis and will require iv abx, and iv fluids for hypotension.  Pt has high risk for clinical deterioration, pt will be admitted for > 2 nites.  Pt will require inpatient status  Time spent in minutes : 70    JamJani GravelD on 07/02/2019 at 2:14 AM

## 2019-07-02 NOTE — Progress Notes (Signed)
ANTICOAGULATION CONSULT NOTE - Initial Consult  Pharmacy Consult for Eliquis Indication: atrial fibrillation  Allergies  Allergen Reactions  . Penicillins Anaphylaxis    Has patient had a PCN reaction causing immediate rash, facial/tongue/throat swelling, SOB or lightheadedness with hypotension: Yes Has patient had a PCN reaction causing severe rash involving mucus membranes or skin necrosis: Yes Has patient had a PCN reaction that required hospitalization: No Has patient had a PCN reaction occurring within the last 10 years: No Tolerate cefepime 05/21/19   . Sulfa Antibiotics Anaphylaxis  . Morphine And Related Nausea And Vomiting  . Voltaren [Diclofenac]     Chest pains - Voltaren Gel     Patient Measurements: Height: 5' (152.4 cm) Weight: 128 lb (58.1 kg) IBW/kg (Calculated) : 45.5  Vital Signs: Temp: 100.4 F (38 C) (10/08 2225) Temp Source: Oral (10/08 2225) BP: 148/88 (10/09 0200) Pulse Rate: 126 (10/09 0200)  Labs: Recent Labs    07/01/19 2247 07/01/19 2308 07/01/19 2309 07/02/19 0043  HGB  --  14.1  --   --   HCT  --  46.8*  --   --   PLT  --  266  --   --   LABPROT  --   --  13.7  --   INR  --   --  1.1  --   CREATININE  --  0.56  --   --   TROPONINIHS 13  --   --  13    Estimated Creatinine Clearance: 58.1 mL/min (by C-G formula based on SCr of 0.56 mg/dL).   Medical History: Past Medical History:  Diagnosis Date  . Adrenal insufficiency ([place])   . HTN (hypertension)   . L4 vertebral fracture (HCC)    L4-L5 fracture  . Palpitations   . Paroxysmal A-fib ([place])   . PVC's (premature ventricular contractions)   . Ulcerative colitis     Medications:  See electronic med rec  Assessment: 63 y.o. F presents with cellulitis, sepsis. Pt on apixaban 2.5mg  po BID PTA for afib and h/o DVT - last dose 10/8 morning. CBC stable on admission.  Pt with wt 58 kg, age 67 yo, and SCr 0.56. This qualifies patient for 5mg  po BID dosing - unsure if her SCr was  elevated at some point and she got put on the lower dose as an outpatient?  Goal of Therapy:  Prevention of CVA and DVT Monitor platelets by anticoagulation protocol: Yes   Plan:  Apixaban 5mg  po BID Will f/u renal function and try to ask patient in the morning if she knows why she is on lower dose of apixaban  Sanda Klein Terilynn Buresh 07/02/2019,2:34 AM

## 2019-07-02 NOTE — Progress Notes (Signed)
CRITICAL VALUE ALERT  Critical Value:  Potassium 2.6  Date & Time Notied:  07/02/19, [id number]  Provider Notified: Maudie Mercury  Orders Received/Actions taken:

## 2019-07-02 NOTE — TOC Initial Note (Signed)
Transition of Care [facility]) - Initial/Assessment Note    Patient Details  Name: Betty Bryan MRN: [id number] Date of Birth: 1955-12-16  [facility] CM/SW Contact:    Shade Flood, LCSW Phone Number: 07/02/2019, 11:18 AM  Clinical Narrative:                  Pt admitted from home. She is high risk for readmission. Pt known to TOC from previous admissions. Pt has services from [facility] at home. It appears pt may have just recently transitioned from their [facility] to their Hospice services.    Pt status discussed with MD in Progression today. Pt may progress to comfort measures depending on her response to current treatment efforts.   TOC will follow and assist as needed.   Barriers to Discharge: Continued Medical Work up   Patient Goals and CMS Choice        Expected Discharge Plan and Services         Living arrangements for the past 2 months: Single Family Home                                      Prior Living Arrangements/Services Living arrangements for the past 2 months: Single Family Home Lives with:: Significant Other Patient language and need for interpreter reviewed:: Yes        Need for Family Participation in Patient Care: Yes (Comment) Care giver support system in place?: Yes (comment) Current home services: DME, Home RN Criminal Activity/Legal Involvement Pertinent to Current Situation/Hospitalization: No - Comment as needed  Activities of Daily Living Home Assistive Devices/Equipment: Built-in shower seat, Oxygen, Wheelchair ADL Screening (condition at time of admission) Patient's cognitive ability adequate to safely complete daily activities?: Yes Is the patient deaf or have difficulty hearing?: No Does the patient have difficulty seeing, even when wearing glasses/contacts?: No Does the patient have difficulty concentrating, remembering, or making decisions?: Yes Patient able to express need for assistance with ADLs?: Yes Does the  patient have difficulty dressing or bathing?: Yes Independently performs ADLs?: No Communication: Independent Dressing (OT): Dependent Is this a change from baseline?: Pre-admission baseline Grooming: Dependent Is this a change from baseline?: Pre-admission baseline Feeding: Independent Bathing: Dependent Is this a change from baseline?: Pre-admission baseline Toileting: Dependent Is this a change from baseline?: Pre-admission baseline In/Out Bed: Dependent Is this a change from baseline?: Pre-admission baseline Walks in Home: Dependent Is this a change from baseline?: Pre-admission baseline Does the patient have difficulty walking or climbing stairs?: Yes Weakness of Legs: Both Weakness of Arms/Hands: Both  Permission Sought/Granted                  Emotional Assessment       Orientation: : Oriented to Self, Oriented to Place, Oriented to  Time, Oriented to Situation Alcohol / Substance Use: Not Applicable Psych Involvement: No (comment)  Admission diagnosis:  Abnormal EKG [R94.31] Cellulitis of right lower leg [L03.115] Sepsis, due to unspecified organism, unspecified whether acute organ dysfunction present [facility]) [A41.9] Patient Active Problem List   Diagnosis Date Noted  . Compression fracture of vertebrae, non-traumatic ([place]) 06/14/2019  . DNR (do not resuscitate) discussion   . Encounter for hospice care discussion   . Vertebral fracture, osteoporotic ([place]) 06/12/2019  . Uncontrolled pain 06/12/2019  . Sepsis due to pneumonia ([place]) 05/20/2019  . Cellulitis 05/20/2019  . Adrenal insufficiency ([place]) 05/20/2019  .  Palliative care by specialist   . Pain of left lower extremity   . Acute on chronic respiratory failure with hypoxia ([place]) 05/09/2019  . Lobar pneumonia ([place]) 05/09/2019  . Sepsis due to undetermined organism ([place]) 05/09/2019  . Goals of care, counseling/discussion 05/09/2019  . Pressure injury of skin 05/09/2019  . Sepsis ([place]) 05/08/2019  .  Tachycardia 05/08/2019  . Pleural effusion, left 05/08/2019  . Essential hypertension 04/16/2019  . PVC's (premature ventricular contractions) 04/16/2019  . History of DVT (deep vein thrombosis) 04/16/2019  . Chronic respiratory failure with hypoxia ([place]) 01/29/2019  . AF (paroxysmal atrial fibrillation) ([place]) 01/29/2019  . Hypomagnesemia   . Swelling of both lower extremities   . Hypokalemia 01/28/2019  . Pain 12/23/2018  . Fall at home, initial encounter 12/23/2018  . Multiple rib fractures 12/23/2018  . Clavicle fracture 12/23/2018  . CAP (community acquired pneumonia) 12/23/2018  . Rib pain on right side 01/05/2018  . Varicose veins of leg with complications 0000000  . Chronic venous insufficiency 06/23/2015   PCP:  Crist Infante, MD Pharmacy:   [facility] [place], [place] AT [place] [id number] [place] [address] Phone: [phone] Fax: [phone]     Social Determinants of Health (SDOH) Interventions    Readmission Risk Interventions Readmission Risk Prevention Plan 07/02/2019 05/14/2019 05/11/2019  Transportation Screening Complete - Complete  PCP or Specialist Appt within 5-7 Days - - -  Not Complete comments - - -  PCP or Specialist Appt within 3-5 Days - Not Complete -  Not Complete comments - Message left at clinic. Did not receive return call -  [place] or [place] - - Complete  Social Work Consult for [place] Planning/Counseling - - Complete  Palliative Care Screening - Complete -  Medication Review (RN Care Manager) Complete - Complete  Some recent data might be hidden

## 2019-07-02 NOTE — Progress Notes (Signed)
Patient's BP in 80/50 range, MD made aware.

## 2019-07-02 NOTE — ED Notes (Signed)
Multiple attempts made by many different RNs, plus the ED MD with no success. MD notified of still needing IV access at this time.

## 2019-07-02 NOTE — ED Notes (Signed)
Date and time results received: 07/02/19 0215   Test: Lactic Acid Critical Value: 3.6  Name of Provider Notified: Mesner, MD

## 2019-07-02 NOTE — ED Notes (Signed)
Date and time results received: 07/02/19 0024  Test: Lactic Critical Value: 3.5  Name of Provider Notified: Mesner, MD

## 2019-07-02 NOTE — Consult Note (Signed)
[facility] Nurse wound consult note Reason for Consult: Cellulitis to right dorsal foot and right lateral malleolus.  Present on admission  Is seen by Dr duda.  I have notified him of this admission at [facility] with the information that the patient may desire comfort care only.  He is of course happy to see her if she is transferred to [facility] and/or he is needed.  She is in vancomycin at this time. Is minimally responsive at this time.  family meeting is planned to discuss goals of care.  Will provide topical wound care orders.   Wound type:Infectious  Pressure Injury POA: NA Measurement: MASD To buttocks 2 cm x 2 cm x 0.2 cm to buttocks Bilateral anterior lower legs are edematous and warm.  Wounds have dark center with devitalized tissue.  Wound [id number] Drainage (amount, consistency, odor) minimal serosanguinous   Periwound:erythema and edema Dressing procedure/placement/frequency: Cleanse lower legs with soap and water and pat dry.  Apply silicone foam to open wounds and change every three days.  Barrier cream to buttocks.  No disposable briefs or underpads.  Will not follow at this time.  Please re-consult if needed.  Domenic Moras MSN, RN, FNP-BC CWON Wound, Ostomy, Continence Nurse Pager [phone]

## 2019-07-03 LAB — BASIC METABOLIC PANEL
Anion gap: 8 (ref 5–15)
BUN: 14 mg/dL (ref 8–23)
CO2: 25 mmol/L (ref 22–32)
Calcium: 8.1 mg/dL — ABNORMAL LOW (ref 8.9–10.3)
Chloride: 108 mmol/L (ref 98–111)
Creatinine, Ser: 0.48 mg/dL (ref 0.44–1.00)
GFR calc Af Amer: >60 mL/min (ref >60)
GFR calc non Af Amer: >60 mL/min (ref >60)
Glucose, Bld: 151 mg/dL — ABNORMAL HIGH (ref 70–99)
Potassium: 4 mmol/L (ref 3.5–5.1)
Sodium: 141 mmol/L (ref 135–145)

## 2019-07-03 LAB — MAGNESIUM: Magnesium: 1.6 mg/dL — ABNORMAL LOW (ref 1.7–2.4)

## 2019-07-03 LAB — CBC
HCT: 34.3 % — ABNORMAL LOW (ref 36.0–46.0)
Hemoglobin: 10.4 g/dL — ABNORMAL LOW (ref 12.0–15.0)
MCH: 29.8 pg (ref 26.0–34.0)
MCHC: 30.3 g/dL (ref 30.0–36.0)
MCV: 98.3 fL (ref 80.0–100.0)
Platelets: 194 10*3/uL (ref 150–400)
RBC: 3.49 MIL/uL — ABNORMAL LOW (ref 3.87–5.11)
RDW: 16.2 % — ABNORMAL HIGH (ref 11.5–15.5)
WBC: 30.7 10*3/uL — ABNORMAL HIGH (ref 4.0–10.5)
nRBC: 0 % (ref 0.0–0.2)

## 2019-07-03 LAB — LACTIC ACID, PLASMA: Lactic Acid, Venous: 1.6 mmol/L (ref 0.5–1.9)

## 2019-07-03 MED ORDER — MAGNESIUM SULFATE 2 GM/50ML IV SOLN
2.0000 g | Freq: Once | INTRAVENOUS | Status: AC
  Administered 2019-07-03: 2 g via INTRAVENOUS
  Filled 2019-07-03: qty 50

## 2019-07-03 MED ORDER — HYDROCORTISONE NA SUCCINATE PF 100 MG IJ SOLR: 50.0000 mg | Freq: Three times a day (TID) | INTRAMUSCULAR | Status: DC

## 2019-07-03 MED ORDER — LACTATED RINGERS IV SOLN
INTRAVENOUS | Status: DC
  Administered 2019-07-03: 08:00:00 via INTRAVENOUS

## 2019-07-03 MED ORDER — HYDROCORTISONE NA SUCCINATE PF 100 MG IJ SOLR
50.0000 mg | Freq: Two times a day (BID) | INTRAMUSCULAR | Status: DC
  Administered 2019-07-03 – 2019-07-04 (×2): 50 mg via INTRAVENOUS
  Filled 2019-07-03 (×2): qty 2

## 2019-07-03 NOTE — Progress Notes (Signed)
PROGRESS NOTE    Betty Bryan  [name] DOB: 17-Feb-1956 DOA: 07/01/2019 PCP: Crist Infante, MD   Brief Narrative:  Per HPI: Betty Bryan y.o.female,history of adrenal insufficiency,T6, T9 compression fracture,paroxysmal atrial fibrillation, DVT, hypertension, ulcerative colitison chronic prednisone,sepsis 8/15-21 secondary to PNA, and LE cellultiis, and sepsis 8/27-30 secondary to PNA, and cellulitis, 9/19-23 for back pain/ flank pain now presents with c/o redness of the right foot, and tachycardia, and dyspnea. Pt denies fever, chills, cough, cp, orthopnea, pnd, change in lower ext edema. Pt notes that her lasix increased to 60mg  po qday by pcp. Pt is not clear about her last dose of eliquis.   Patient has been admitted with sepsis secondary to right lower extremity cellulitis as well as H CAP.  10/10: Lactic acidosis has improved this morning, but she still maintains a leukocytosis.  Blood cultures with no growth x2 days and urine culture pending.  She still remains tachycardic, but blood pressures have stabilized overnight.  Transfer to telemetry and maintain on cefepime and DC vancomycin.  Assessment & Plan:   Principal Problem:   Sepsis ([place]) Active Problems:   AF (paroxysmal atrial fibrillation) (HCC)   Cellulitis   Adrenal insufficiency (HCC)   Sepsis secondary to right lower extremity cellulitis with associated lactic acidosis-improving -I do not believe she actually has HCAP since she does not appear to have any significant symptoms related to this -Lactic acid is downtrending this a.m. and will monitor once again in the morning -Maintain on cefepime and DC vancomycin as MRSA nares negative -Blood cultures with no growth in the last 2 days -Urine cultures pending -Discussed case with Dr. Sharol Given on 10/9 who would be happy to see her if infection or wounds worsen, but no need for transfer at the moment; will mark leg for cellulitis  Sinus tachycardia  in the setting of paroxysmal atrial fibrillation history -Troponins with stable trend and are only minimally elevated and related to tachycardia -TSH within normal limits -Maintain on atenolol 25 mg p.o. 3 times daily for heart rate control -Eliquis for ongoing anticoagulation  Adrenal insufficiency -Holding home prednisone -Maintain on hydrocortisone 50 mg IV every 6 hours, now to every 12 hours as BP has improved  GERD -Continue PPI  Chronic back pain with history of compression fractures -Restart home gabapentin -Continue Percocet every 6 hours as needed  History of edema -Maintain on gentle IV fluid for now -Avoid home Lasix currently -Plan to wean oxygen to off as tolerated  Hypomagnesemia -Replete and reevaluate in a.m.  DVT prophylaxis: Eliquis Code Status: DNR Family Communication: Companion and sister on 10/9 Disposition Plan: Plan to transfer to telemetry and maintain on cefepime and DC vancomycin.  Gentle IV fluid.  Monitor heart rates and blood cultures.   Consultants:   None  Procedures:   None  Antimicrobials:  Anti-infectives (From admission, onward)   Start     Dose/Rate Route Frequency Ordered Stop   07/02/19 2200  vancomycin (VANCOCIN) IVPB 750 mg/150 ml premix  Status:  Discontinued     750 mg 150 mL/hr over 60 Minutes Intravenous Every 24 hours 07/01/19 2250 07/03/19 0703   07/02/19 0300  ceFEPIme (MAXIPIME) 2 g in sodium chloride 0.9 % 100 mL IVPB     2 g 200 mL/hr over 30 Minutes Intravenous Every 12 hours 07/02/19 0230     07/01/19 2300  cefTRIAXone (ROCEPHIN) 1 g in sodium chloride 0.9 % 100 mL IVPB  Status:  Discontinued     1 g  200 mL/hr over 30 Minutes Intravenous Daily at bedtime 07/01/19 2244 07/02/19 0224   07/01/19 2300  vancomycin (VANCOCIN) IVPB 1000 mg/200 mL premix     1,000 mg 200 mL/hr over 60 Minutes Intravenous STAT 07/01/19 2244 07/02/19 0209       Subjective: Patient seen and evaluated today with no new acute  complaints or concerns. No acute concerns or events noted overnight.  She is having a BM this morning.  Blood pressures have remained stable overnight, but she still remains tachycardic.  Objective: Vitals:   07/03/19 0400 07/03/19 0423 07/03/19 0500 07/03/19 0600  BP: (!) 138/95  (!) 140/105 (!) 127/99  Pulse: 100  98 (!) 106  Resp: 18  (!) 22 (!) 26  Temp:      TempSrc:      SpO2: 96%  97% 99%  Weight:  65.1 kg    Height:        Intake/Output Summary (Last 24 hours) at 07/03/2019 0708 Last data filed at 07/03/2019 0545 Gross per 24 hour  Intake 1797.22 ml  Output 2150 ml  Net -352.78 ml   Filed Weights   07/01/19 2215 07/02/19 0359 07/03/19 0423  Weight: 58.1 kg 61.2 kg 65.1 kg    Examination:  General exam: Appears calm and comfortable  Respiratory system: Clear to auscultation. Respiratory effort normal.  On 3L nasal cannula oxygen. Cardiovascular system: S1 & S2 heard, tachycardic. No JVD, murmurs, rubs, gallops or clicks. No pedal edema. Gastrointestinal system: Abdomen is nondistended, soft and nontender. No organomegaly or masses felt. Normal bowel sounds heard. Central nervous system: Alert and oriented. No focal neurological deficits. Extremities: Symmetric 5 x 5 power. Skin: Right lower extremity with erythema and dressings clean dry and intact. Psychiatry: Judgement and insight appear normal. Mood & affect appropriate.     Data Reviewed: I have personally reviewed following labs and imaging studies  CBC: Recent Labs  Lab 07/01/19 2308 07/02/19 0525 07/03/19 0437  WBC 20.4* 21.8* 30.7*  NEUTROABS 18.0*  --   --   HGB 14.1 11.4* 10.4*  HCT 46.8* 37.7 34.3*  MCV 98.7 98.4 98.3  PLT 266 204 [id number]   Basic Metabolic Panel: Recent Labs  Lab 07/01/19 2308 07/02/19 0525 07/02/19 0721 07/02/19 1212 07/03/19 0437  NA 141 139  --  139 141  K 3.6 2.6*  --  3.7 4.0  CL 100 103  --  105 108  CO2 29 24  --  21* 25  GLUCOSE 104* 114*  --  103* 151*  BUN 10  11  --  11 14  CREATININE 0.56 0.54  --  0.56 0.48  CALCIUM 8.4* 7.4*  --  7.5* 8.1*  MG  --   --  1.4*  --  1.6*   GFR: Estimated Creatinine Clearance: 61.4 mL/min (by C-G formula based on SCr of 0.48 mg/dL). Liver Function Tests: Recent Labs  Lab 07/01/19 2308 07/02/19 0525  AST 28 22  ALT 21 18  ALKPHOS 158* 114  BILITOT 0.6 0.6  PROT 6.1* 4.6*  ALBUMIN 3.2* 2.3*   No results for input(s): LIPASE, AMYLASE in the last 168 hours. No results for input(s): AMMONIA in the last 168 hours. Coagulation Profile: Recent Labs  Lab 07/01/19 2309  INR 1.1   Cardiac Enzymes: No results for input(s): CKTOTAL, CKMB, CKMBINDEX, TROPONINI in the last 168 hours. BNP (last 3 results) No results for input(s): PROBNP in the last 8760 hours. HbA1C: No results for input(s): HGBA1C in the last  72 hours. CBG: No results for input(s): GLUCAP in the last 168 hours. Lipid Profile: No results for input(s): CHOL, HDL, LDLCALC, TRIG, CHOLHDL, LDLDIRECT in the last 72 hours. Thyroid Function Tests: No results for input(s): TSH, T4TOTAL, FREET4, T3FREE, THYROIDAB in the last 72 hours. Anemia Panel: No results for input(s): VITAMINB12, FOLATE, FERRITIN, TIBC, IRON, RETICCTPCT in the last 72 hours. Sepsis Labs: Recent Labs  Lab 07/02/19 0043 07/02/19 0721 07/02/19 1212 07/03/19 0437  LATICACIDVEN 3.6* 4.0* 5.3* 1.6    Recent Results (from the past 240 hour(s))  Blood Culture (routine x 2)     Status: None (Preliminary result)   Collection Time: 07/01/19 10:55 PM   Specimen: BLOOD  Result Value Ref Range Status   Specimen Description BLOOD RIGHT ANTECUBITAL  Final   Special Requests   Final    BOTTLES DRAWN AEROBIC AND ANAEROBIC Blood Culture adequate volume   Culture   Final    NO GROWTH 2 DAYS Performed at [facility], [address] [place], [place] [address]    Report Status PENDING  Incomplete  Blood Culture (routine x 2)     Status: None (Preliminary result)   Collection Time:  07/01/19 11:22 PM   Specimen: BLOOD  Result Value Ref Range Status   Specimen Description BLOOD RIGHT ANTECUBITAL  Final   Special Requests   Final    BOTTLES DRAWN AEROBIC AND ANAEROBIC Blood Culture adequate volume   Culture   Final    NO GROWTH 2 DAYS Performed at [facility], [address], [place], [place] [address]    Report Status PENDING  Incomplete  SARS CORONAVIRUS 2 (TAT 6-24 HRS) Nasopharyngeal Nasopharyngeal Swab     Status: None   Collection Time: 07/02/19  1:30 AM   Specimen: Nasopharyngeal Swab  Result Value Ref Range Status   SARS Coronavirus 2 NEGATIVE NEGATIVE Final    Comment: (NOTE) SARS-CoV-2 target nucleic acids are NOT DETECTED. The SARS-CoV-2 RNA is generally detectable in upper and lower respiratory specimens during the acute phase of infection. Negative results do not preclude SARS-CoV-2 infection, do not rule out co-infections with other pathogens, and should not be used as the sole basis for treatment or other patient management decisions. Negative results must be combined with clinical observations, patient history, and epidemiological information. The expected result is Negative. Fact Sheet for Patients: SugarRoll.be Fact Sheet for Healthcare Providers: https://www.woods-mathews.com/ This test is not yet approved or cleared by the [place] FDA and  has been authorized for detection and/or diagnosis of SARS-CoV-2 by FDA under an Emergency Use Authorization (EUA). This EUA will remain  in effect (meaning this test can be used) for the duration of the COVID-19 declaration under Section 56 4(b)(1) of the Act, 21 U.S.C. section 360bbb-3(b)(1), unless the authorization is terminated or revoked sooner. Performed at [facility], [place] [address], [place], [place] [address]   MRSA PCR Screening     Status: None   Collection Time: 07/02/19  3:35 AM   Specimen: Nasal Mucosa; Nasopharyngeal  Result Value  Ref Range Status   MRSA by PCR NEGATIVE NEGATIVE Final    Comment:        The GeneXpert MRSA Assay (FDA approved for NASAL specimens only), is one component of a comprehensive MRSA colonization surveillance program. It is not intended to diagnose MRSA infection nor to guide or monitor treatment for MRSA infections. Performed at [facility], [address], [place],  [address]  Radiology Studies: Ct Chest Wo Contrast  Result Date: 07/02/2019 CLINICAL DATA:  Dyspnea, pleural effusion EXAM: CT CHEST WITHOUT CONTRAST TECHNIQUE: Multidetector CT imaging of the chest was performed following the standard protocol without IV contrast. COMPARISON:  Radiograph 07/01/2019, CT 05/10/2019, 11/25/2018 FINDINGS: Cardiovascular: Limited evaluation without intravenous contrast. Nonaneurysmal aorta. Borderline heart size. No large pericardial effusion Mediastinum/Nodes: Midline trachea. No thyroid mass. No suspicious adenopathy. Esophagus within normal limits. Lungs/Pleura: No significant pleural effusion. Consolidations in the lingula and bilateral lower lobes. Findings may be slightly progressed at the left lower lobe compared to prior CT. No pneumothorax. Upper Abdomen: No acute abnormality. Musculoskeletal: Scoliosis of the spine. New moderate compression fracture at T4. Severe chronic compression at T6. Stable mild superior endplate deformity at T7. New moderate severe compression fracture at T8. Stable severe compression fracture at T9 with vertebra plana. Chronic superior endplate fracture at 624THL T12 and L1. Moderate superior endplate compression fractures at L1 and L2 and L3, new since March 2020 abdominal CT. IMPRESSION: 1. No significant pleural effusion identified. Partial consolidations or atelectasis within the lingula and bilateral lower lobes, with slight increased airspace disease at the left lower lobe which may reflect increased atelectasis or superimposed pneumonia. 2. Multiple  compression fractures of the thoracolumbar spine. Fractures at T4 and T8 are new since comparison CT from August 2020. Fractures at L1 through L3 are new since abdominal CT from March 2020. Electronically Signed   By: Donavan Foil M.D.   On: 07/02/2019 04:00   Dg Chest Port 1 View  Result Date: 07/02/2019 CLINICAL DATA:  Tachycardia EXAM: PORTABLE CHEST 1 VIEW COMPARISON:  June 12, 2019 FINDINGS: Again noted is lordosis with rotation and low lung volume. A moderate left pleural effusion remains. There is hazy opacity seen within the remainder of the left lung. There is patchy/hazy airspace opacity seen throughout the right lung which does not appear to be significantly changed from the prior exam. IMPRESSION: Moderate left pleural effusion with likely adjacent layering Patchy/hazy airspace opacity throughout the right lung not significantly changed since the prior exam. Could be due to edema, however cannot exclude an underlying superimposed infection. Electronically Signed   By: Prudencio Pair M.D.   On: 07/02/2019 00:09        Scheduled Meds:  apixaban  5 mg Oral BID   atenolol  25 mg Oral Q8H   balsalazide  2,250 mg Oral TID   Chlorhexidine Gluconate Cloth  6 each Topical Daily   fluticasone  1 spray Each Nare BID   hydrocortisone  25 mg Rectal QHS   hydrocortisone sod succinate (SOLU-CORTEF) inj  50 mg Intravenous Q6H   loratadine  10 mg Oral Daily   mouth rinse  15 mL Mouth Rinse BID   pantoprazole  40 mg Oral Daily   Continuous Infusions:  ceFEPime (MAXIPIME) IV 2 g (07/03/19 0328)   lactated ringers     magnesium sulfate bolus IVPB       LOS: 1 day    Time spent: 30 minutes    Sophie Quiles Darleen Crocker, DO [facility] Pager [phone]  If 7PM-7AM, please contact night-coverage [email or web address] Password [email or web address] 07/03/2019, 7:08 AM

## 2019-07-04 LAB — CBC
HCT: 32.1 % — ABNORMAL LOW (ref 36.0–46.0)
Hemoglobin: 9.3 g/dL — ABNORMAL LOW (ref 12.0–15.0)
MCH: 29.1 pg (ref 26.0–34.0)
MCHC: 29 g/dL — ABNORMAL LOW (ref 30.0–36.0)
MCV: 100.3 fL — ABNORMAL HIGH (ref 80.0–100.0)
Platelets: 171 10*3/uL (ref 150–400)
RBC: 3.2 MIL/uL — ABNORMAL LOW (ref 3.87–5.11)
RDW: 16.2 % — ABNORMAL HIGH (ref 11.5–15.5)
WBC: 26.8 10*3/uL — ABNORMAL HIGH (ref 4.0–10.5)
nRBC: 0 % (ref 0.0–0.2)

## 2019-07-04 LAB — LACTIC ACID, PLASMA: Lactic Acid, Venous: 1 mmol/L (ref 0.5–1.9)

## 2019-07-04 LAB — URINE CULTURE: Culture: 900 — AB

## 2019-07-04 LAB — BASIC METABOLIC PANEL
Anion gap: 9 (ref 5–15)
BUN: 11 mg/dL (ref 8–23)
CO2: 27 mmol/L (ref 22–32)
Calcium: 8.1 mg/dL — ABNORMAL LOW (ref 8.9–10.3)
Chloride: 106 mmol/L (ref 98–111)
Creatinine, Ser: 0.35 mg/dL — ABNORMAL LOW (ref 0.44–1.00)
GFR calc Af Amer: >60 mL/min (ref >60)
GFR calc non Af Amer: >60 mL/min (ref >60)
Glucose, Bld: 109 mg/dL — ABNORMAL HIGH (ref 70–99)
Potassium: 3.2 mmol/L — ABNORMAL LOW (ref 3.5–5.1)
Sodium: 142 mmol/L (ref 135–145)

## 2019-07-04 LAB — MAGNESIUM: Magnesium: 2.1 mg/dL (ref 1.7–2.4)

## 2019-07-04 MED ORDER — SODIUM CHLORIDE 0.9 % IV SOLN: 1.0000 g | INTRAVENOUS | Status: DC

## 2019-07-04 MED ORDER — DOXYCYCLINE HYCLATE 100 MG PO TABS: 100.0000 mg | ORAL_TABLET | Freq: Two times a day (BID) | ORAL | Status: DC

## 2019-07-04 MED ORDER — DIAZEPAM 2 MG PO TABS
2.0000 mg | ORAL_TABLET | Freq: Three times a day (TID) | ORAL | Status: DC | PRN
  Administered 2019-07-04: 2 mg via ORAL
  Filled 2019-07-04: qty 1

## 2019-07-04 MED ORDER — FUROSEMIDE 20 MG PO TABS
20.0000 mg | ORAL_TABLET | Freq: Every day | ORAL | Status: DC
  Administered 2019-07-04 – 2019-07-05 (×2): 20 mg via ORAL
  Filled 2019-07-04 (×2): qty 1

## 2019-07-04 MED ORDER — CEFDINIR 300 MG PO CAPS
300.0000 mg | ORAL_CAPSULE | Freq: Two times a day (BID) | ORAL | Status: DC
  Administered 2019-07-04 – 2019-07-05 (×3): 300 mg via ORAL
  Filled 2019-07-04 (×3): qty 1

## 2019-07-04 MED ORDER — PREDNISONE 5 MG PO TABS
7.5000 mg | ORAL_TABLET | Freq: Every day | ORAL | Status: DC
  Administered 2019-07-04 – 2019-07-05 (×2): 7.5 mg via ORAL
  Filled 2019-07-04 (×2): qty 2

## 2019-07-04 MED ORDER — POLYETHYLENE GLYCOL 3350 17 G PO PACK
17.0000 g | PACK | Freq: Every day | ORAL | Status: DC
  Administered 2019-07-04: 17 g via ORAL
  Filled 2019-07-04 (×2): qty 1

## 2019-07-04 MED ORDER — POTASSIUM CHLORIDE CRYS ER 20 MEQ PO TBCR
40.0000 meq | EXTENDED_RELEASE_TABLET | Freq: Once | ORAL | Status: AC
  Administered 2019-07-04: 40 meq via ORAL
  Filled 2019-07-04: qty 2

## 2019-07-04 NOTE — Progress Notes (Signed)
PROGRESS NOTE    Betty Bryan  [name] DOB: 04-23-1956 DOA: 07/01/2019 PCP: Betty Infante, MD   Brief Narrative:  Per HPI: Betty Bryan y.o.female,history of adrenal insufficiency,T6, T9 compression fracture,paroxysmal atrial fibrillation, DVT, hypertension, ulcerative colitison chronic prednisone,sepsis 8/15-21 secondary to PNA, and LE cellultiis, and sepsis 8/27-30 secondary to PNA, and cellulitis, 9/19-23 for back pain/ flank pain now presents with c/o redness of the right foot, and tachycardia, and dyspnea. Pt denies fever, chills, cough, cp, orthopnea, pnd, change in lower ext edema. Pt notes that her lasix increased to 60mg  po qday by pcp. Pt is not clear about her last dose of eliquis.  Patient has been admitted with sepsis secondary to right lower extremity cellulitis as well as H CAP.  10/10: Lactic acidosis has improved this morning, but she still maintains a leukocytosis.  Blood cultures with no growth x2 days and urine culture pending.  She still remains tachycardic, but blood pressures have stabilized overnight.  Transfer to telemetry and maintain on cefepime and DC vancomycin.  10/11: Patient appears to have improvement in her blood pressure readings, but still on IV hydrocortisone which will be discontinued today.  She will be given some Lasix with no further IV fluid.  We will plan for discharge back to her home health tomorrow if stable on doxycycline and Omnicef.  Assessment & Plan:   Principal Problem:   Sepsis ([place]) Active Problems:   AF (paroxysmal atrial fibrillation) (HCC)   Cellulitis   Adrenal insufficiency (HCC)   Sepsis secondary to right lower extremity cellulitis with associated lactic acidosis-improving -I do not believe she actually has HCAP since she does not appear to have any significant symptoms related to this -Lactic acid is downtrending this a.m. and will monitor once again in the morning -DC'd cefepime today and will  maintain on oral doxycycline -Blood cultures with no growth in the last 2 days -Urine cultures pending -Discussed case with Dr. Sharol Given on 10/9 who would be happy to see her if infection or wounds worsen, but no need for transfer at the moment; will mark leg for cellulitis  Sinus tachycardia in the setting of paroxysmal atrial fibrillation history -Troponins with stable trend and are only minimally elevated and related to tachycardia -TSH within normal limits -Maintain on atenolol 25 mg p.o. 3 times daily for heart rate control -Eliquis for ongoing anticoagulation  Adrenal insufficiency -DC IV hydrocortisone -Resume home prednisone  GERD -Continue PPI  Chronic back pain with history of compression fractures -Restart home gabapentin -Continue Percocet every 6 hours as needed -Requires frequent readjustment in bed  History of edema -We will need further IV fluid and resume home Lasix -Plan to wean oxygen to off as tolerated  Hypomagnesemia -Repleted -Replace potassium and recheck in a.m.  DVT prophylaxis: Eliquis Code Status: DNR Family Communication: Companion and sister on 10/10 Disposition Plan: Plan to transfer to telemetry and maintain change cefepime to Rocephin for Klebsiella UTI coverage and maintain on doxycycline for cellulitis.   Consultants:   None  Procedures:   None  Antimicrobials:  Anti-infectives (From admission, onward)   Start     Dose/Rate Route Frequency Ordered Stop   07/04/19 1315  cefTRIAXone (ROCEPHIN) 1 g in sodium chloride 0.9 % 100 mL IVPB     1 g 200 mL/hr over 30 Minutes Intravenous Every 24 hours 07/04/19 1304     07/04/19 1315  doxycycline (VIBRA-TABS) tablet 100 mg     100 mg Oral Every 12 hours 07/04/19 1304  07/02/19 2200  vancomycin (VANCOCIN) IVPB 750 mg/150 ml premix  Status:  Discontinued     750 mg 150 mL/hr over 60 Minutes Intravenous Every 24 hours 07/01/19 2250 07/03/19 0703   07/02/19 0300  ceFEPIme (MAXIPIME)  2 g in sodium chloride 0.9 % 100 mL IVPB  Status:  Discontinued     2 g 200 mL/hr over 30 Minutes Intravenous Every 12 hours 07/02/19 0230 07/04/19 1304   07/01/19 2300  cefTRIAXone (ROCEPHIN) 1 g in sodium chloride 0.9 % 100 mL IVPB  Status:  Discontinued     1 g 200 mL/hr over 30 Minutes Intravenous Daily at bedtime 07/01/19 2244 07/02/19 0224   07/01/19 2300  vancomycin (VANCOCIN) IVPB 1000 mg/200 mL premix     1,000 mg 200 mL/hr over 60 Minutes Intravenous STAT 07/01/19 2244 07/02/19 0209      Subjective: Patient seen and evaluated today with ongoing back pain concerns and frequently needs repositioning.  Blood pressures are elevated now and therefore, will discontinue IV hydrocortisone.  Objective: Vitals:   07/03/19 1700 07/03/19 1800 07/03/19 2043 07/04/19 0556  BP: (!) 159/113 (!) 165/118 (!) 159/119 134/84  Pulse: (!) 107 (!) 114 (!) 101 91  Resp: (!) 21 (!) 29 (!) 24 20  Temp:   98.1 F (36.7 C) (!) 97.5 F (36.4 C)  TempSrc:   Oral Oral  SpO2: 95% 94% 98% 100%  Weight:      Height:        Intake/Output Summary (Last 24 hours) at 07/04/2019 1304 Last data filed at 07/03/2019 2225 Gross per 24 hour  Intake 1153.94 ml  Output 1150 ml  Net 3.94 ml   Filed Weights   07/01/19 2215 07/02/19 0359 07/03/19 0423  Weight: 58.1 kg 61.2 kg 65.1 kg    Examination:  General exam: Appears calm and comfortable  Respiratory system: Clear to auscultation. Respiratory effort normal.  On 3 L nasal cannula oxygen. Cardiovascular system: S1 & S2 heard, RRR. No JVD, murmurs, rubs, gallops or clicks. No pedal edema. Gastrointestinal system: Abdomen is nondistended, soft and nontender. No organomegaly or masses felt. Normal bowel sounds heard. Central nervous system: Alert and oriented. No focal neurological deficits. Extremities: Symmetric 5 x 5 power. Skin: No rashes, lesions or ulcers right lower extremity skin rash improving with minimal erythema today.  Wounds are clean dry  and intact. Psychiatry: Judgement and insight appear normal. Mood & affect appropriate.     Data Reviewed: I have personally reviewed following labs and imaging studies  CBC: Recent Labs  Lab 07/01/19 2308 07/02/19 0525 07/03/19 0437 07/04/19 0645  WBC 20.4* 21.8* 30.7* 26.8*  NEUTROABS 18.0*  --   --   --   HGB 14.1 11.4* 10.4* 9.3*  HCT 46.8* 37.7 34.3* 32.1*  MCV 98.7 98.4 98.3 100.3*  PLT 266 204 194 [id number]   Basic Metabolic Panel: Recent Labs  Lab 07/01/19 2308 07/02/19 0525 07/02/19 0721 07/02/19 1212 07/03/19 0437 07/04/19 0645  NA 141 139  --  139 141 142  K 3.6 2.6*  --  3.7 4.0 3.2*  CL 100 103  --  105 108 106  CO2 29 24  --  21* 25 27  GLUCOSE 104* 114*  --  103* 151* 109*  BUN 10 11  --  11 14 11   CREATININE 0.56 0.54  --  0.56 0.48 0.35*  CALCIUM 8.4* 7.4*  --  7.5* 8.1* 8.1*  MG  --   --  1.4*  --  1.6* 2.1   GFR: Estimated Creatinine Clearance: 61.4 mL/min (A) (by C-G formula based on SCr of 0.35 mg/dL (L)). Liver Function Tests: Recent Labs  Lab 07/01/19 2308 07/02/19 0525  AST 28 22  ALT 21 18  ALKPHOS 158* 114  BILITOT 0.6 0.6  PROT 6.1* 4.6*  ALBUMIN 3.2* 2.3*   No results for input(s): LIPASE, AMYLASE in the last 168 hours. No results for input(s): AMMONIA in the last 168 hours. Coagulation Profile: Recent Labs  Lab 07/01/19 2309  INR 1.1   Cardiac Enzymes: No results for input(s): CKTOTAL, CKMB, CKMBINDEX, TROPONINI in the last 168 hours. BNP (last 3 results) No results for input(s): PROBNP in the last 8760 hours. HbA1C: No results for input(s): HGBA1C in the last 72 hours. CBG: No results for input(s): GLUCAP in the last 168 hours. Lipid Profile: No results for input(s): CHOL, HDL, LDLCALC, TRIG, CHOLHDL, LDLDIRECT in the last 72 hours. Thyroid Function Tests: No results for input(s): TSH, T4TOTAL, FREET4, T3FREE, THYROIDAB in the last 72 hours. Anemia Panel: No results for input(s): VITAMINB12, FOLATE, FERRITIN, TIBC,  IRON, RETICCTPCT in the last 72 hours. Sepsis Labs: Recent Labs  Lab 07/02/19 0721 07/02/19 1212 07/03/19 0437 07/04/19 0645  LATICACIDVEN 4.0* 5.3* 1.6 1.0    Recent Results (from the past 240 hour(s))  Blood Culture (routine x 2)     Status: None (Preliminary result)   Collection Time: 07/01/19 10:55 PM   Specimen: BLOOD  Result Value Ref Range Status   Specimen Description BLOOD RIGHT ANTECUBITAL  Final   Special Requests   Final    BOTTLES DRAWN AEROBIC AND ANAEROBIC Blood Culture adequate volume   Culture   Final    NO GROWTH 2 DAYS Performed at [facility], [address] [place], [place] [address]    Report Status PENDING  Incomplete  Blood Culture (routine x 2)     Status: None (Preliminary result)   Collection Time: 07/01/19 11:22 PM   Specimen: BLOOD  Result Value Ref Range Status   Specimen Description BLOOD RIGHT ANTECUBITAL  Final   Special Requests   Final    BOTTLES DRAWN AEROBIC AND ANAEROBIC Blood Culture adequate volume   Culture   Final    NO GROWTH 2 DAYS Performed at [facility], [address] [place],  [address]    Report Status PENDING  Incomplete  SARS CORONAVIRUS 2 (TAT 6-24 HRS) Nasopharyngeal Nasopharyngeal Swab     Status: None   Collection Time: 07/02/19  1:30 AM   Specimen: Nasopharyngeal Swab  Result Value Ref Range Status   SARS Coronavirus 2 NEGATIVE NEGATIVE Final    Comment: (NOTE) SARS-CoV-2 target nucleic acids are NOT DETECTED. The SARS-CoV-2 RNA is generally detectable in upper and lower respiratory specimens during the acute phase of infection. Negative results do not preclude SARS-CoV-2 infection, do not rule out co-infections with other pathogens, and should not be used as the sole basis for treatment or other patient management decisions. Negative results must be combined with clinical observations, patient history, and epidemiological information. The expected result is Negative. Fact Sheet for Patients:  SugarRoll.be Fact Sheet for Healthcare Providers: https://www.woods-mathews.com/ This test is not yet approved or cleared by the [place] FDA and  has been authorized for detection and/or diagnosis of SARS-CoV-2 by FDA under an Emergency Use Authorization (EUA). This EUA will remain  in effect (meaning this test can be used) for the duration of the COVID-19 declaration under Section 56 4(b)(1) of the Act,  21 U.S.C. section 360bbb-3(b)(1), unless the authorization is terminated or revoked sooner. Performed at [facility], [place] [address], [place], [place] [address]   Urine culture     Status: Abnormal   Collection Time: 07/02/19  1:31 AM   Specimen: In/Out Cath Urine  Result Value Ref Range Status   Specimen Description   Final    IN/OUT CATH URINE Performed at [facility], [address], [place], [place] [address]    Special Requests   Final    NONE Performed at [facility], [address], [place], [place] [address]    Culture 900 COLONIES/mL KLEBSIELLA PNEUMONIAE (A)  Final   Report Status 07/04/2019 FINAL  Final   Organism ID, Bacteria KLEBSIELLA PNEUMONIAE (A)  Final      Susceptibility   Klebsiella pneumoniae - MIC*    AMPICILLIN RESISTANT Resistant     CEFAZOLIN <=4 SENSITIVE Sensitive     CEFTRIAXONE <=1 SENSITIVE Sensitive     CIPROFLOXACIN <=0.25 SENSITIVE Sensitive     GENTAMICIN <=1 SENSITIVE Sensitive     IMIPENEM <=0.25 SENSITIVE Sensitive     NITROFURANTOIN 64 INTERMEDIATE Intermediate     TRIMETH/SULFA <=20 SENSITIVE Sensitive     AMPICILLIN/SULBACTAM 4 SENSITIVE Sensitive     PIP/TAZO <=4 SENSITIVE Sensitive     Extended ESBL NEGATIVE Sensitive     * 900 COLONIES/mL KLEBSIELLA PNEUMONIAE  MRSA PCR Screening     Status: None   Collection Time: 07/02/19  3:35 AM   Specimen: Nasal Mucosa; Nasopharyngeal  Result Value Ref Range Status   MRSA by PCR NEGATIVE NEGATIVE Final    Comment:        The GeneXpert  MRSA Assay (FDA approved for NASAL specimens only), is one component of a comprehensive MRSA colonization surveillance program. It is not intended to diagnose MRSA infection nor to guide or monitor treatment for MRSA infections. Performed at [facility], [address], [place], [place] [address]          Radiology Studies: No results found.      Scheduled Meds: . apixaban  5 mg Oral BID  . atenolol  25 mg Oral Q8H  . balsalazide  2,250 mg Oral TID  . Chlorhexidine Gluconate Cloth  6 each Topical Daily  . doxycycline  100 mg Oral Q12H  . fluticasone  1 spray Each Nare BID  . furosemide  20 mg Oral Daily  . hydrocortisone  25 mg Rectal QHS  . loratadine  10 mg Oral Daily  . mouth rinse  15 mL Mouth Rinse BID  . pantoprazole  40 mg Oral Daily  . predniSONE  7.5 mg Oral Daily   Continuous Infusions: . cefTRIAXone (ROCEPHIN)  IV       LOS: 2 days    Time spent: 30 minutes    Rosette Bellavance Darleen Crocker, DO [facility] Pager [phone]  If 7PM-7AM, please contact night-coverage [email or web address] Password [email or web address] 07/04/2019, 1:04 PM

## 2019-07-04 NOTE — Progress Notes (Signed)
IV site infiltrated on am rounds, Dr. Manuella Ghazi in room and notified. Unable to restart IV due to poor vein selection and swollen arms. Dr. Manuella Ghazi notified that IV rocephin has not yet been administered due to lack of IV site. Wound care performed on patient's multiple wound sites. Pt tolerated well.

## 2019-07-04 NOTE — Progress Notes (Signed)
2028- pt arrived on unit transferred from ICU. Assessed patient and wrote my name and work phone number on her whiteboard.  2052- pt c/o back pain 7/10, assisted pt in repositioning and gave pt her PRN pain medication. Pt delcined take two tablets of percocet and only took one.  2120- Assisted patient to reposition, rearranged pillows, made sure all items were within reach. Pt stated she was uncomfortable despite being repositioned.  2230- Pt's IV was beeping; fixed IV and assisted patient in repositioning. Pt stated she was uncomfortable despite being repositioned.  0000- rounded on patient; appeared to be sleeping  0245- patient called for repositioning, two techs assisted her in sliding up in bed, rearranged pillows, and made sure all items were within her reach. Pt stated she was uncomfortable despite being repositioned.  0300- Pt called me on the work phone saying no one had been in her room to help her, that she felt terrible, her back hurt, and she needed to be repositioned. Told patient I was currently with another patient and would be with her in approximately 52min.  41- Assisted patient in repositioning and offered two percocets again per MD order, again pt declined and only took one. Patient explained to nurse that when she was in the ICU they were physically lifting her to arrange her in the bed, and mentioned a particular nurse she would be glad to have in the morning. I explained she was not in the ICU, our beds are different up here, and that she would not have that nurse in the morning because they are an ICU nurse and would be in the ICU. I apologized that she was unsatisfied with her care and asked if this had happened the last time she was on our floor three weeks ago. Pt stated "this has never happened before" and she did not remember being here three weeks ago. I called the tech to help me reposition her, pt stated she was still uncomfortable no matter what we did for her. Told  patient to give the percocet time to work.  0340- Pt called for repositioning, was assisted by other staff members. Pt stated she was uncomfortable despite being repositioned.  0405- received phone call from patient's significant other. He stated Betty Bryan was in extreme pain and had expressed to him that she "didn't understand why she couldn't get any help." Relayed to patient's significant other everything we have been doing for the patient that is in this note and apologized that we are unable to get the patient comfortable.

## 2019-07-04 NOTE — Progress Notes (Signed)
Patient calling multiple staff members and  asking for help.  Patient states she needs to be repositioned in the bed. Patient states she cannot get comfortable in the bed and wants to be " hoisted" up in the bed.  Patient has been repositioned and assisted several times, but states that she feels as if she has not had any help.  Charge nurse spoke to patient and explained that she had been helped several times and had not be neglected by staff. Charge nurse even helped to reposition patient while in the room after nursing techs have just reposition patient within the previous 10 minutes.  Explained to patient that patient's back pain was probably patient's biggest hurdle to being comfortable in bed.

## 2019-07-05 LAB — CBC
HCT: 34.7 % — ABNORMAL LOW (ref 36.0–46.0)
Hemoglobin: 10.2 g/dL — ABNORMAL LOW (ref 12.0–15.0)
MCH: 29.2 pg (ref 26.0–34.0)
MCHC: 29.4 g/dL — ABNORMAL LOW (ref 30.0–36.0)
MCV: 99.4 fL (ref 80.0–100.0)
Platelets: 194 10*3/uL (ref 150–400)
RBC: 3.49 MIL/uL — ABNORMAL LOW (ref 3.87–5.11)
RDW: 16.1 % — ABNORMAL HIGH (ref 11.5–15.5)
WBC: 18.4 10*3/uL — ABNORMAL HIGH (ref 4.0–10.5)
nRBC: 0 % (ref 0.0–0.2)

## 2019-07-05 LAB — BASIC METABOLIC PANEL
Anion gap: 10 (ref 5–15)
BUN: 7 mg/dL — ABNORMAL LOW (ref 8–23)
CO2: 31 mmol/L (ref 22–32)
Calcium: 8.1 mg/dL — ABNORMAL LOW (ref 8.9–10.3)
Chloride: 101 mmol/L (ref 98–111)
Creatinine, Ser: 0.35 mg/dL — ABNORMAL LOW (ref 0.44–1.00)
GFR calc Af Amer: >60 mL/min (ref >60)
GFR calc non Af Amer: >60 mL/min (ref >60)
Glucose, Bld: 84 mg/dL (ref 70–99)
Potassium: 3 mmol/L — ABNORMAL LOW (ref 3.5–5.1)
Sodium: 142 mmol/L (ref 135–145)

## 2019-07-05 MED ORDER — POTASSIUM CHLORIDE CRYS ER 20 MEQ PO TBCR
40.0000 meq | EXTENDED_RELEASE_TABLET | Freq: Once | ORAL | Status: AC
  Administered 2019-07-05: 40 meq via ORAL
  Filled 2019-07-05: qty 2

## 2019-07-05 MED ORDER — CEFDINIR 300 MG PO CAPS: 300.0000 mg | ORAL_CAPSULE | Freq: Two times a day (BID) | ORAL | 0 refills | Status: AC

## 2019-07-05 NOTE — TOC Transition Note (Signed)
Transition of Care [facility]) - CM/SW Discharge Note   Patient Details  Name: Betty Bryan MRN: [id number] Date of Birth: 1956-04-18  Transition of Care [facility]) CM/SW Contact:  Shade Flood, LCSW Phone Number: 07/05/2019, 11:37 AM   Clinical Narrative:     Pt to dc home today. Notified Karen at [employer] of pt's dc. They will follow up with pt at home. No other TOC needs for dc.  Final next level of care: [place] Barriers to Discharge: Barriers Resolved   Patient Goals and CMS Choice        Discharge Placement                       Discharge Plan and Services                                     Social Determinants of Health (SDOH) Interventions     Readmission Risk Interventions Readmission Risk Prevention Plan 07/02/2019 05/14/2019 05/11/2019  Transportation Screening Complete - Complete  PCP or Specialist Appt within 5-7 Days - - -  Not Complete comments - - -  PCP or Specialist Appt within 3-5 Days - Not Complete -  Not Complete comments - Message left at clinic. Did not receive return call -  [place] or [place] - - Complete  Social Work Consult for [place] Planning/Counseling - - Complete  Palliative Care Screening - Complete -  Medication Review (RN Care Manager) Complete - Complete  Some recent data might be hidden

## 2019-07-05 NOTE — Discharge Summary (Signed)
Physician Discharge Summary  Betty Bryan [name] DOB: 02-Jul-1956 DOA: 07/01/2019  PCP: Crist Infante, MD  Admit date: 07/01/2019  Discharge date: 07/05/2019  Admitted From:Home  Disposition:  Home with hospice services to resume  Recommendations for Outpatient Follow-up:  1. Follow up with PCP in 3-5 days and repeat BMP to ensure that potassium is repleted 2. Continue on Omnicef as prescribed for 6 more days to complete 10-day course of treatment for cellulitis as well as Klebsiella UTI 3. Follow-up with Dr. Sharol Given outpatient as scheduled  Home Health:Home with hospice  Equipment/Devices: Has home equipment including oxygen  Discharge Condition: Stable  CODE STATUS: DNR  Diet recommendation: Heart Healthy  Brief/Interim Summary: Per HPI: SandraMilesis a62 y.o.female,history of adrenal insufficiency,T6, T9 compression fracture,paroxysmal atrial fibrillation, DVT, hypertension, ulcerative colitison chronic prednisone,sepsis 8/15-21 secondary to PNA, and LE cellultiis, and sepsis 8/27-30 secondary to PNA, and cellulitis, 9/19-23 for back pain/ flank pain now presents with c/o redness of the right foot, and tachycardia, and dyspnea. Pt denies fever, chills, cough, cp, orthopnea, pnd, change in lower ext edema. Pt notes that her lasix increased to 60mg  po qday by pcp. Pt is not clear about her last dose of eliquis.  Patient has been admitted with sepsis secondary to right lower extremity cellulitis as well as H CAP.  10/10:Lactic acidosis has improved this morning, but she still maintains a leukocytosis. Blood cultures with no growth x2 days and urine culture pending. She still remains tachycardic, but blood pressures have stabilized overnight. Transfer to telemetry and maintain on cefepime and DC vancomycin.  10/11: Patient appears to have improvement in her blood pressure readings, but still on IV hydrocortisone which will be discontinued today.  She will be  given some Lasix with no further IV fluid.  We will plan for discharge back to her home health tomorrow if stable on Omnicef.  10/12: Patient overall has been doing quite well with stable blood pressure readings on her home prednisone.  Cellulitis has improved considerably and she will continue on Omnicef as prescribed for 6 more days to complete 10-day course of treatment.  This will also treat her Klebsiella UTI.  No other significant concerns noted throughout the course of this admission and she appears stable for discharge on usual home medications.  She has been given some potassium supplementation prior to discharge and will need close follow-up with her PCP regarding this.  She is to resume her home hospice services.  Discharge Diagnoses:  Principal Problem:   Sepsis ([place]) Active Problems:   AF (paroxysmal atrial fibrillation) (HCC)   Cellulitis   Adrenal insufficiency (HCC)  Principal discharge diagnosis: Sepsis secondary to right lower extremity cellulitis and Klebsiella UTI.  Discharge Instructions  Discharge Instructions    Diet - low sodium heart healthy   Complete by: As directed    Increase activity slowly   Complete by: As directed      Allergies as of 07/05/2019      Reactions   Penicillins Anaphylaxis   Has patient had a PCN reaction causing immediate rash, facial/tongue/throat swelling, SOB or lightheadedness with hypotension: Yes Has patient had a PCN reaction causing severe rash involving mucus membranes or skin necrosis: Yes Has patient had a PCN reaction that required hospitalization: No Has patient had a PCN reaction occurring within the last 10 years: No Tolerate cefepime 05/21/19   Sulfa Antibiotics Anaphylaxis   Morphine And Related Nausea And Vomiting   Voltaren [diclofenac]    Chest pains - Voltaren Gel  Medication List    TAKE these medications   atenolol 25 MG tablet Commonly known as: TENORMIN Take 1 tablet (25 mg total) by mouth every 8  (eight) hours. What changed:   how much to take  additional instructions   balsalazide 750 MG capsule Commonly known as: COLAZAL Take 2,250 mg by mouth 3 (three) times daily.   calcium-vitamin D 500-200 MG-UNIT tablet Commonly known as: OSCAL WITH D Take 1 tablet by mouth daily.   cefdinir 300 MG capsule Commonly known as: OMNICEF Take 1 capsule (300 mg total) by mouth every 12 (twelve) hours for 6 days.   collagenase ointment Commonly known as: SANTYL Apply topically daily. Apply to left leg ulcers daily.   diazepam 5 MG tablet Commonly known as: VALIUM Take 1 tablet by mouth daily. Take 0.5 tab PO QD   docusate sodium 100 MG capsule Commonly known as: COLACE Take 1 capsule (100 mg total) by mouth 2 (two) times daily.   Eliquis 2.5 MG Tabs tablet Generic drug: apixaban Take 2.5 mg by mouth 2 (two) times daily.   fluticasone 50 MCG/ACT nasal spray Commonly known as: FLONASE Place 1 spray into both nostrils 2 (two) times daily.   furosemide 20 MG tablet Commonly known as: LASIX Take 1 tablet (20 mg total) by mouth daily.   gabapentin 100 MG capsule Commonly known as: NEURONTIN Take 100-300 mg by mouth 3 (three) times daily as needed (for nerve pain).   HYDROCORTISONE ACE (RECTAL) 30 MG Supp Place 1 suppository rectally at bedtime.   loratadine 10 MG tablet Commonly known as: CLARITIN Take 10 mg by mouth daily.   nystatin powder Commonly known as: MYCOSTATIN/NYSTOP Apply 1 application topically 3 (three) times daily.   ondansetron 4 MG tablet Commonly known as: ZOFRAN Take 1 tablet (4 mg total) by mouth every 6 (six) hours as needed for nausea.   oxyCODONE-acetaminophen 5-325 MG tablet Commonly known as: PERCOCET/ROXICET Take 1-2 tablets by mouth every 6 (six) hours as needed for moderate pain or severe pain.   pantoprazole 20 MG tablet Commonly known as: PROTONIX Take 20 mg by mouth daily.   polyethylene glycol 17 g packet Commonly known as:  MIRALAX / GLYCOLAX Take 17 g by mouth daily as needed for mild constipation.   potassium chloride SA 20 MEQ tablet Commonly known as: KLOR-CON Take 2 tablets (40 mEq total) by mouth daily for 3 days. What changed: how much to take   predniSONE 5 MG tablet Commonly known as: DELTASONE Take 7.5-8 mg by mouth daily. As directed by your physician      Follow-up Information    Crist Infante, MD Follow up in 3 day(s).   Specialty: Internal Medicine Contact information: [place] [address] [phone]        Buford Dresser, MD .   Specialty: Cardiology Contact information: [address] [place] [address] [phone]          Allergies  Allergen Reactions  . Penicillins Anaphylaxis    Has patient had a PCN reaction causing immediate rash, facial/tongue/throat swelling, SOB or lightheadedness with hypotension: Yes Has patient had a PCN reaction causing severe rash involving mucus membranes or skin necrosis: Yes Has patient had a PCN reaction that required hospitalization: No Has patient had a PCN reaction occurring within the last 10 years: No Tolerate cefepime 05/21/19   . Sulfa Antibiotics Anaphylaxis  . Morphine And Related Nausea And Vomiting  . Voltaren [Diclofenac]     Chest  pains - Voltaren Gel     Consultations:  None   Procedures/Studies: Ct Chest Wo Contrast  Result Date: 07/02/2019 CLINICAL DATA:  Dyspnea, pleural effusion EXAM: CT CHEST WITHOUT CONTRAST TECHNIQUE: Multidetector CT imaging of the chest was performed following the standard protocol without IV contrast. COMPARISON:  Radiograph 07/01/2019, CT 05/10/2019, 11/25/2018 FINDINGS: Cardiovascular: Limited evaluation without intravenous contrast. Nonaneurysmal aorta. Borderline heart size. No large pericardial effusion Mediastinum/Nodes: Midline trachea. No thyroid mass. No suspicious adenopathy. Esophagus within normal limits. Lungs/Pleura: No significant  pleural effusion. Consolidations in the lingula and bilateral lower lobes. Findings may be slightly progressed at the left lower lobe compared to prior CT. No pneumothorax. Upper Abdomen: No acute abnormality. Musculoskeletal: Scoliosis of the spine. New moderate compression fracture at T4. Severe chronic compression at T6. Stable mild superior endplate deformity at T7. New moderate severe compression fracture at T8. Stable severe compression fracture at T9 with vertebra plana. Chronic superior endplate fracture at 624THL T12 and L1. Moderate superior endplate compression fractures at L1 and L2 and L3, new since March 2020 abdominal CT. IMPRESSION: 1. No significant pleural effusion identified. Partial consolidations or atelectasis within the lingula and bilateral lower lobes, with slight increased airspace disease at the left lower lobe which may reflect increased atelectasis or superimposed pneumonia. 2. Multiple compression fractures of the thoracolumbar spine. Fractures at T4 and T8 are new since comparison CT from August 2020. Fractures at L1 through L3 are new since abdominal CT from March 2020. Electronically Signed   By: Donavan Foil M.D.   On: 07/02/2019 04:00   Ct Abdomen Pelvis W Contrast  Result Date: 06/12/2019 CLINICAL DATA:  Flank pain, abdominal pain EXAM: CT ABDOMEN AND PELVIS WITH CONTRAST TECHNIQUE: Multidetector CT imaging of the abdomen and pelvis was performed using the standard protocol following bolus administration of intravenous contrast. CONTRAST:  170mL OMNIPAQUE IOHEXOL 300 MG/ML  SOLN COMPARISON:  11/25/2018 FINDINGS: Lower chest: Trace bilateral pleural effusions with associated bibasilar atelectasis. Hepatobiliary: Probable focal fatty infiltration along the falciform ligament. Liver otherwise unremarkable. Unremarkable gallbladder. No intrahepatic biliary dilatation. Pancreas: Unremarkable. No pancreatic ductal dilatation or surrounding inflammatory changes. Spleen: Normal in size  without focal abnormality. Adrenals/Urinary Tract: Adrenal glands are unremarkable. Kidneys are normal, without renal calculi, focal lesion, or hydronephrosis. Bladder is unremarkable. Stomach/Bowel: Stomach and bowel within normal limits. No dilated loops of bowel. No focal bowel wall thickening or inflammatory changes. Vascular/Lymphatic: No significant vascular findings are present. No enlarged abdominal or pelvic lymph nodes. Reproductive: Multiple partially calcified uterine fibroids. No adnexal masses. Other: Rectus diastasis. Small fat containing umbilical hernia. Fluid is noted accumulating along the dependent portion of the left anterior abdominal wall which is displaced to the left. No ascites. Musculoskeletal: There are extensive compression fractures throughout the thoracic and lumbar spine, many of which are chronic, some of which are new or progressed from prior CTs 11/25/2018 and 05/10/2019. There also numerous bilateral subacute to chronic healing rib fractures. Healing right superior and inferior pubic rami fractures. *T4: New superior endplate compression fracture involving approximately 25% vertebral body height loss. *T6: Unchanged advanced compression deformity involving greater than 50% height loss. *T8: Mild superior endplate compression involving less than 25% vertebral body height loss which appears minimally progressed from prior. *T9: Unchanged severe compression fracture. *T11: Unchanged mild superior endplate compression fracture of approximately 25% height loss. *T12: Unchanged central superior endplate compression deformity. *L1: Unchanged superior endplate compression fracture with approximately 50% height loss. *L2: Acute superior endplate compression fracture with approximately  60% vertebral body height loss. *L3: Acute superior endplate compression fracture with approximately 50% vertebral body height loss. *L4: Acute progression of compression fracture now with approximately 75%  height loss. *L5: Chronic compression fracture with approximately 50% height loss. IMPRESSION: 1. Multiple new and progressive vertebral body compression fractures throughout the thoracic and lumbar spine including T4, T8, L2, L3, and L4. 2. Trace bilateral pleural effusions with associated bibasilar atelectasis. 3. Fluid accumulation within the superficial soft tissues overlying the left anterior abdominal wall, nonspecific. Electronically Signed   By: Davina Poke M.D.   On: 06/12/2019 15:29   Dg Chest Port 1 View  Result Date: 07/02/2019 CLINICAL DATA:  Tachycardia EXAM: PORTABLE CHEST 1 VIEW COMPARISON:  June 12, 2019 FINDINGS: Again noted is lordosis with rotation and low lung volume. A moderate left pleural effusion remains. There is hazy opacity seen within the remainder of the left lung. There is patchy/hazy airspace opacity seen throughout the right lung which does not appear to be significantly changed from the prior exam. IMPRESSION: Moderate left pleural effusion with likely adjacent layering Patchy/hazy airspace opacity throughout the right lung not significantly changed since the prior exam. Could be due to edema, however cannot exclude an underlying superimposed infection. Electronically Signed   By: Prudencio Pair M.D.   On: 07/02/2019 00:09   Dg Chest Portable 1 View  Result Date: 06/12/2019 CLINICAL DATA:  Left-sided chest and abdominal pain, history of pneumonia EXAM: PORTABLE CHEST 1 VIEW COMPARISON:  05/20/2019 FINDINGS: Lordotic, low volume AP portable examination. No evident interval change in a probable left pleural effusion with associated atelectasis or consolidation. Pulmonary vascular prominence and diffuse interstitial opacity, likely edema. No new or focal airspace opacity. The cardiac borders are predominantly obscured. IMPRESSION: Lordotic, low volume AP portable examination. No evident interval change in a probable left pleural effusion with associated atelectasis or  consolidation. Pulmonary vascular prominence and diffuse interstitial opacity, likely edema. No new or focal airspace opacity. Electronically Signed   By: Eddie Candle M.D.   On: 06/12/2019 10:36     Discharge Exam: Vitals:   07/04/19 2212 07/05/19 0529  BP: (!) 155/103 (!) 133/95  Pulse: 97 86  Resp: 20 20  Temp: 97.8 F (36.6 C) 97.7 F (36.5 C)  SpO2: 100% 99%   Vitals:   07/04/19 0556 07/04/19 1500 07/04/19 2212 07/05/19 0529  BP: 134/84 (!) 155/97 (!) 155/103 (!) 133/95  Pulse: 91 92 97 86  Resp: 20  20 20   Temp: (!) 97.5 F (36.4 C) 97.7 F (36.5 C) 97.8 F (36.6 C) 97.7 F (36.5 C)  TempSrc: Oral Oral Oral Oral  SpO2: 100% 99% 100% 99%  Weight:    66.4 kg  Height:        General: Pt is alert, awake, not in acute distress Cardiovascular: RRR, S1/S2 +, no rubs, no gallops Respiratory: CTA bilaterally, no wheezing, no rhonchi, on 3 L nasal cannula oxygen Abdominal: Soft, NT, ND, bowel sounds + Extremities: no edema, no cyanosis Right lower extremity cellulitis improved.  Wounds are clean dry and intact.    The results of significant diagnostics from this hospitalization (including imaging, microbiology, ancillary and laboratory) are listed below for reference.     Microbiology: Recent Results (from the past 240 hour(s))  Blood Culture (routine x 2)     Status: None (Preliminary result)   Collection Time: 07/01/19 10:55 PM   Specimen: BLOOD  Result Value Ref Range Status   Specimen Description BLOOD RIGHT ANTECUBITAL  Final   Special Requests   Final    BOTTLES DRAWN AEROBIC AND ANAEROBIC Blood Culture adequate volume   Culture   Final    NO GROWTH 4 DAYS Performed at [facility], [address], [place], [place] [address]    Report Status PENDING  Incomplete  Blood Culture (routine x 2)     Status: None (Preliminary result)   Collection Time: 07/01/19 11:22 PM   Specimen: BLOOD  Result Value Ref Range Status   Specimen Description BLOOD RIGHT  ANTECUBITAL  Final   Special Requests   Final    BOTTLES DRAWN AEROBIC AND ANAEROBIC Blood Culture adequate volume   Culture   Final    NO GROWTH 4 DAYS Performed at [facility], [address], [place], [place] [address]    Report Status PENDING  Incomplete  SARS CORONAVIRUS 2 (TAT 6-24 HRS) Nasopharyngeal Nasopharyngeal Swab     Status: None   Collection Time: 07/02/19  1:30 AM   Specimen: Nasopharyngeal Swab  Result Value Ref Range Status   SARS Coronavirus 2 NEGATIVE NEGATIVE Final    Comment: (NOTE) SARS-CoV-2 target nucleic acids are NOT DETECTED. The SARS-CoV-2 RNA is generally detectable in upper and lower respiratory specimens during the acute phase of infection. Negative results do not preclude SARS-CoV-2 infection, do not rule out co-infections with other pathogens, and should not be used as the sole basis for treatment or other patient management decisions. Negative results must be combined with clinical observations, patient history, and epidemiological information. The expected result is Negative. Fact Sheet for Patients: SugarRoll.be Fact Sheet for Healthcare Providers: https://www.woods-mathews.com/ This test is not yet approved or cleared by the [place] FDA and  has been authorized for detection and/or diagnosis of SARS-CoV-2 by FDA under an Emergency Use Authorization (EUA). This EUA will remain  in effect (meaning this test can be used) for the duration of the COVID-19 declaration under Section 56 4(b)(1) of the Act, 21 U.S.C. section 360bbb-3(b)(1), unless the authorization is terminated or revoked sooner. Performed at [facility], [place] [address], [place], [place] [address]   Urine culture     Status: Abnormal   Collection Time: 07/02/19  1:31 AM   Specimen: In/Out Cath Urine  Result Value Ref Range Status   Specimen Description   Final    IN/OUT CATH URINE Performed at [facility], [address] [place], [place] [address]    Special Requests   Final    NONE Performed at [facility], [address], [place], [place] [address]    Culture 900 COLONIES/mL KLEBSIELLA PNEUMONIAE (A)  Final   Report Status 07/04/2019 FINAL  Final   Organism ID, Bacteria KLEBSIELLA PNEUMONIAE (A)  Final      Susceptibility   Klebsiella pneumoniae - MIC*    AMPICILLIN RESISTANT Resistant     CEFAZOLIN <=4 SENSITIVE Sensitive     CEFTRIAXONE <=1 SENSITIVE Sensitive     CIPROFLOXACIN <=0.25 SENSITIVE Sensitive     GENTAMICIN <=1 SENSITIVE Sensitive     IMIPENEM <=0.25 SENSITIVE Sensitive     NITROFURANTOIN 64 INTERMEDIATE Intermediate     TRIMETH/SULFA <=20 SENSITIVE Sensitive     AMPICILLIN/SULBACTAM 4 SENSITIVE Sensitive     PIP/TAZO <=4 SENSITIVE Sensitive     Extended ESBL NEGATIVE Sensitive     * 900 COLONIES/mL KLEBSIELLA PNEUMONIAE  MRSA PCR Screening     Status: None   Collection Time: 07/02/19  3:35 AM   Specimen: Nasal Mucosa; Nasopharyngeal  Result Value Ref Range Status   MRSA by PCR NEGATIVE NEGATIVE Final    Comment:        The GeneXpert MRSA Assay (FDA approved for NASAL specimens only), is one component of a comprehensive MRSA colonization surveillance program. It is not intended to diagnose MRSA infection nor to guide or monitor treatment for MRSA infections. Performed at [facility], [address] [place], [place] [address]      Labs: BNP (last 3 results) Recent Labs    05/08/19 2117 06/12/19 0939 07/01/19 2247  BNP 112.0* 321.0* [id number]*   Basic Metabolic Panel: Recent Labs  Lab 07/02/19 0525 07/02/19 0721 07/02/19 1212 07/03/19 0437 07/04/19 0645 07/05/19 0520  NA 139  --  139 141 142 142  K 2.6*  --  3.7 4.0 3.2* 3.0*  CL 103  --  105 108 106 101  CO2 24  --  21* 25 27 31   GLUCOSE 114*  --  103* 151* 109* 84  BUN 11  --  11 14 11  7*  CREATININE 0.54  --  0.56 0.48 0.35* 0.35*  CALCIUM 7.4*  --  7.5* 8.1* 8.1* 8.1*  MG  --  1.4*  --  1.6* 2.1  --     Liver Function Tests: Recent Labs  Lab 07/01/19 2308 07/02/19 0525  AST 28 22  ALT 21 18  ALKPHOS 158* 114  BILITOT 0.6 0.6  PROT 6.1* 4.6*  ALBUMIN 3.2* 2.3*   No results for input(s): LIPASE, AMYLASE in the last 168 hours. No results for input(s): AMMONIA in the last 168 hours. CBC: Recent Labs  Lab 07/01/19 2308 07/02/19 0525 07/03/19 0437 07/04/19 0645 07/05/19 0520  WBC 20.4* 21.8* 30.7* 26.8* 18.4*  NEUTROABS 18.0*  --   --   --   --   HGB 14.1 11.4* 10.4* 9.3* 10.2*  HCT 46.8* 37.7 34.3* 32.1* 34.7*  MCV 98.7 98.4 98.3 100.3* 99.4  PLT 266 204 194 171 194   Cardiac Enzymes: No results for input(s): CKTOTAL, CKMB, CKMBINDEX, TROPONINI in the last 168 hours. BNP: Invalid input(s): POCBNP CBG: No results for input(s): GLUCAP in the last 168 hours. D-Dimer No results for input(s): DDIMER in the last 72 hours. Hgb A1c No results for input(s): HGBA1C in the last 72 hours. Lipid Profile No results for input(s): CHOL, HDL, LDLCALC, TRIG, CHOLHDL, LDLDIRECT in the last 72 hours. Thyroid function studies No results for input(s): TSH, T4TOTAL, T3FREE, THYROIDAB in the last 72 hours.  Invalid input(s): FREET3 Anemia work up No results for input(s): VITAMINB12, FOLATE, FERRITIN, TIBC, IRON, RETICCTPCT in the last 72 hours. Urinalysis    Component Value Date/Time   COLORURINE STRAW (A) 07/01/2019 2235   APPEARANCEUR CLEAR 07/01/2019 2235   LABSPEC 1.008 07/01/2019 2235   PHURINE 6.0 07/01/2019 2235   GLUCOSEU NEGATIVE 07/01/2019 2235   HGBUR NEGATIVE 07/01/2019 [place] 07/01/2019 2235   KETONESUR NEGATIVE 07/01/2019 2235   PROTEINUR NEGATIVE 07/01/2019 2235   NITRITE POSITIVE (A) 07/01/2019 2235   LEUKOCYTESUR NEGATIVE 07/01/2019 2235   Sepsis Labs Invalid input(s): PROCALCITONIN,  WBC,  LACTICIDVEN Microbiology Recent Results (from the past 240 hour(s))  Blood Culture (routine x 2)     Status: None (Preliminary result)   Collection  Time: 07/01/19 10:55 PM   Specimen: BLOOD  Result Value Ref Range Status   Specimen Description BLOOD RIGHT ANTECUBITAL  Final   Special Requests   Final    BOTTLES DRAWN AEROBIC AND ANAEROBIC Blood  Culture adequate volume   Culture   Final    NO GROWTH 4 DAYS Performed at [facility], [address] [place], [place] [address]    Report Status PENDING  Incomplete  Blood Culture (routine x 2)     Status: None (Preliminary result)   Collection Time: 07/01/19 11:22 PM   Specimen: BLOOD  Result Value Ref Range Status   Specimen Description BLOOD RIGHT ANTECUBITAL  Final   Special Requests   Final    BOTTLES DRAWN AEROBIC AND ANAEROBIC Blood Culture adequate volume   Culture   Final    NO GROWTH 4 DAYS Performed at [facility], [address] [place], [place] [address]    Report Status PENDING  Incomplete  SARS CORONAVIRUS 2 (TAT 6-24 HRS) Nasopharyngeal Nasopharyngeal Swab     Status: None   Collection Time: 07/02/19  1:30 AM   Specimen: Nasopharyngeal Swab  Result Value Ref Range Status   SARS Coronavirus 2 NEGATIVE NEGATIVE Final    Comment: (NOTE) SARS-CoV-2 target nucleic acids are NOT DETECTED. The SARS-CoV-2 RNA is generally detectable in upper and lower respiratory specimens during the acute phase of infection. Negative results do not preclude SARS-CoV-2 infection, do not rule out co-infections with other pathogens, and should not be used as the sole basis for treatment or other patient management decisions. Negative results must be combined with clinical observations, patient history, and epidemiological information. The expected result is Negative. Fact Sheet for Patients: SugarRoll.be Fact Sheet for Healthcare Providers: https://www.woods-mathews.com/ This test is not yet approved or cleared by the [place] FDA and  has been authorized for detection and/or diagnosis of SARS-CoV-2 by FDA under an Emergency Use  Authorization (EUA). This EUA will remain  in effect (meaning this test can be used) for the duration of the COVID-19 declaration under Section 56 4(b)(1) of the Act, 21 U.S.C. section 360bbb-3(b)(1), unless the authorization is terminated or revoked sooner. Performed at [facility], [place] [address], [place], [place] [address]   Urine culture     Status: Abnormal   Collection Time: 07/02/19  1:31 AM   Specimen: In/Out Cath Urine  Result Value Ref Range Status   Specimen Description   Final    IN/OUT CATH URINE Performed at [facility], [address], [place], [place] [address]    Special Requests   Final    NONE Performed at [facility], [address] [place], [place] [address]    Culture 900 COLONIES/mL KLEBSIELLA PNEUMONIAE (A)  Final   Report Status 07/04/2019 FINAL  Final   Organism ID, Bacteria KLEBSIELLA PNEUMONIAE (A)  Final      Susceptibility   Klebsiella pneumoniae - MIC*    AMPICILLIN RESISTANT Resistant     CEFAZOLIN <=4 SENSITIVE Sensitive     CEFTRIAXONE <=1 SENSITIVE Sensitive     CIPROFLOXACIN <=0.25 SENSITIVE Sensitive     GENTAMICIN <=1 SENSITIVE Sensitive     IMIPENEM <=0.25 SENSITIVE Sensitive     NITROFURANTOIN 64 INTERMEDIATE Intermediate     TRIMETH/SULFA <=20 SENSITIVE Sensitive     AMPICILLIN/SULBACTAM 4 SENSITIVE Sensitive     PIP/TAZO <=4 SENSITIVE Sensitive     Extended ESBL NEGATIVE Sensitive     * 900 COLONIES/mL KLEBSIELLA PNEUMONIAE  MRSA PCR Screening     Status: None   Collection Time: 07/02/19  3:35 AM   Specimen: Nasal Mucosa; Nasopharyngeal  Result Value Ref Range Status   MRSA by PCR NEGATIVE NEGATIVE Final    Comment:  The GeneXpert MRSA Assay (FDA approved for NASAL specimens only), is one component of a comprehensive MRSA colonization surveillance program. It is not intended to diagnose MRSA infection nor to guide or monitor treatment for MRSA infections. Performed at [facility], [address],  [place], [place] [address]      Time coordinating discharge: 40 minutes  SIGNED:   Rodena Goldmann, DO [facility] 07/05/2019, 10:11 AM  If 7PM-7AM, please contact night-coverage [email or web address] Password [email or web address]

## 2019-07-05 NOTE — Progress Notes (Signed)
2230 : Patient has called multiple (at least 3) times to be "put on the bedpan".  After sitting for 15 minutes, she does not have a bowel movement, but her thighs and buttocks are red, and do not blanche.  Educated patient on the importance of preventing further skin breakdown.  And discussed only going on the bedpan when she knows she has to have a bowel movement instead of getting on the bedpan because she "thinks she might" need to have a bowel movement.    During medication pass, I cleaned the patient's room and cleaned off her bedside table.  Patient asked to be pulled up in the bed.  I pulled her up and while I was moving her head up, she used her legs to pull herself back down.  Explained to patient that this was shearing her skin and there was no beneficial reason for me to pull her up in the bed if she was going to move herself back down.  Patient stated that "I just move down when the bed comes up." I explained that this would be a reasonable explanation if I could not see her dig her heels into the bed and bend her knees causing her to fall back down.  Within 5 minutes patient asked to be pulled up again.  Placed a pillow under patient's bottom to prevent her from sliding back down.    0130: Patient called out for the bedpan.    0140: Patient had a bowel movement in the bed.  Pam, NT gave patient a full bath and linen change.    0215: I rounded to check on patient and she complained because she had a bowel movement in the bed.  Tried to alleviate patients embarrassment.  Patient began to complain about the care she was receiving and blamed Pam for not responding quick enough to her call for help.  Explained that sometimes everyone is tied up and that when she called Pam was giving a bath, and I was with another patient starting an IV.  Explained that we do the best we can to come as fast as we can, but it was not appropriate for [place] to stop helping another patient to help her.  I apologized that  she was so unhappy with her care, and asked if there was anything I could do to improve her stay.  She asked to be pulled up and repositioned.  This was done.  Patient still complains of being uncomfortable.  I asked if there was anything I could do to make her more comfortable and she said no.  She did ask who she should make the complaint to and I explained that [place], the manager, would be here in the morning and would be available to take her complaint.

## 2019-07-05 NOTE — Progress Notes (Signed)
Nsg Discharge Note  Admit Date:  07/01/2019 Discharge date: 07/05/2019   Betty Bryan to be D/C'd Home. per MD order.  AVS completed.  Copy for chart, and copy for patient signed, and dated. Patient/caregiver able to verbalize understanding.  Discharge Medication: Allergies as of 07/05/2019      Reactions   Penicillins Anaphylaxis   Has patient had a PCN reaction causing immediate rash, facial/tongue/throat swelling, SOB or lightheadedness with hypotension: Yes Has patient had a PCN reaction causing severe rash involving mucus membranes or skin necrosis: Yes Has patient had a PCN reaction that required hospitalization: No Has patient had a PCN reaction occurring within the last 10 years: No Tolerate cefepime 05/21/19   Sulfa Antibiotics Anaphylaxis   Morphine And Related Nausea And Vomiting   Voltaren [diclofenac]    Chest pains - Voltaren Gel       Medication List    TAKE these medications   atenolol 25 MG tablet Commonly known as: TENORMIN Take 1 tablet (25 mg total) by mouth every 8 (eight) hours. What changed:   how much to take  additional instructions   balsalazide 750 MG capsule Commonly known as: COLAZAL Take 2,250 mg by mouth 3 (three) times daily.   calcium-vitamin D 500-200 MG-UNIT tablet Commonly known as: OSCAL WITH D Take 1 tablet by mouth daily.   cefdinir 300 MG capsule Commonly known as: OMNICEF Take 1 capsule (300 mg total) by mouth every 12 (twelve) hours for 6 days.   collagenase ointment Commonly known as: SANTYL Apply topically daily. Apply to left leg ulcers daily.   diazepam 5 MG tablet Commonly known as: VALIUM Take 1 tablet by mouth daily. Take 0.5 tab PO QD   docusate sodium 100 MG capsule Commonly known as: COLACE Take 1 capsule (100 mg total) by mouth 2 (two) times daily.   Eliquis 2.5 MG Tabs tablet Generic drug: apixaban Take 2.5 mg by mouth 2 (two) times daily.   fluticasone 50 MCG/ACT nasal spray Commonly known as:  FLONASE Place 1 spray into both nostrils 2 (two) times daily.   furosemide 20 MG tablet Commonly known as: LASIX Take 1 tablet (20 mg total) by mouth daily.   gabapentin 100 MG capsule Commonly known as: NEURONTIN Take 100-300 mg by mouth 3 (three) times daily as needed (for nerve pain).   HYDROCORTISONE ACE (RECTAL) 30 MG Supp Place 1 suppository rectally at bedtime.   loratadine 10 MG tablet Commonly known as: CLARITIN Take 10 mg by mouth daily.   nystatin powder Commonly known as: MYCOSTATIN/NYSTOP Apply 1 application topically 3 (three) times daily.   ondansetron 4 MG tablet Commonly known as: ZOFRAN Take 1 tablet (4 mg total) by mouth every 6 (six) hours as needed for nausea.   oxyCODONE-acetaminophen 5-325 MG tablet Commonly known as: PERCOCET/ROXICET Take 1-2 tablets by mouth every 6 (six) hours as needed for moderate pain or severe pain.   pantoprazole 20 MG tablet Commonly known as: PROTONIX Take 20 mg by mouth daily.   polyethylene glycol 17 g packet Commonly known as: MIRALAX / GLYCOLAX Take 17 g by mouth daily as needed for mild constipation.   potassium chloride SA 20 MEQ tablet Commonly known as: KLOR-CON Take 2 tablets (40 mEq total) by mouth daily for 3 days. What changed: how much to take   predniSONE 5 MG tablet Commonly known as: DELTASONE Take 7.5-8 mg by mouth daily. As directed by your physician       Discharge Assessment: Vitals:  07/04/19 2212 07/05/19 0529  BP: (!) 155/103 (!) 133/95  Pulse: 97 86  Resp: 20 20  Temp: 97.8 F (36.6 C) 97.7 F (36.5 C)  SpO2: 100% 99%   Skin clean, dry and intact without evidence of skin break down, no evidence of skin tears noted. IV catheter discontinued intact. Site without signs and symptoms of complications - no redness or edema noted at insertion site, patient denies c/o pain - only slight tenderness at site.  Dressing with slight pressure applied.  D/c Instructions-Education: Discharge  instructions given to patient/family with verbalized understanding. D/c education completed with patient/family including follow up instructions, medication list, d/c activities limitations if indicated, with other d/c instructions as indicated by MD - patient able to verbalize understanding, all questions fully answered. Patient instructed to return to ED, call 911, or call MD for any changes in condition.  Patient escorted via [place], and D/C home via private auto.  Carney Corners, RN 07/05/2019 11:00 AM

## 2019-07-05 NOTE — Progress Notes (Signed)
Patient called out, asked to speak with nurse.  I entered the room with Ronny Bacon, RN assuming patient wanted to be repositioned.  Patient stated she just wanted "to be sure someone would come when I called".  I reiterated that we do the best we can to respond as timely as possible, but that sometimes we were busy with other patients and it would take [place] a few minutes to get there.  Then she reiterated the events that happened earlier and stated that she felt like she had not been helped throughout the night and that because of this she had a bowel movement in the bed.  Again, I attempted to relieve anxieties and embarrassment.  Emotional support and reassurance provided.  Offered repositioning while we were in the room, and patient stated she was fine.

## 2019-07-06 DIAGNOSIS — I4891 Unspecified atrial fibrillation: Secondary | ICD-10-CM | POA: Diagnosis not present

## 2019-07-06 DIAGNOSIS — R32 Unspecified urinary incontinence: Secondary | ICD-10-CM | POA: Diagnosis not present

## 2019-07-06 DIAGNOSIS — I10 Essential (primary) hypertension: Secondary | ICD-10-CM | POA: Diagnosis not present

## 2019-07-06 DIAGNOSIS — R159 Full incontinence of feces: Secondary | ICD-10-CM | POA: Diagnosis not present

## 2019-07-06 DIAGNOSIS — E2749 Other adrenocortical insufficiency: Secondary | ICD-10-CM | POA: Diagnosis not present

## 2019-07-06 DIAGNOSIS — J9610 Chronic respiratory failure, unspecified whether with hypoxia or hypercapnia: Secondary | ICD-10-CM | POA: Diagnosis not present

## 2019-07-06 DIAGNOSIS — Z515 Encounter for palliative care: Secondary | ICD-10-CM | POA: Diagnosis not present

## 2019-07-06 DIAGNOSIS — I509 Heart failure, unspecified: Secondary | ICD-10-CM | POA: Diagnosis not present

## 2019-07-06 LAB — CULTURE, BLOOD (ROUTINE X 2)
Culture: NO GROWTH
Culture: NO GROWTH
Special Requests: ADEQUATE
Special Requests: ADEQUATE

## 2019-07-07 DIAGNOSIS — R159 Full incontinence of feces: Secondary | ICD-10-CM | POA: Diagnosis not present

## 2019-07-07 DIAGNOSIS — J9610 Chronic respiratory failure, unspecified whether with hypoxia or hypercapnia: Secondary | ICD-10-CM | POA: Diagnosis not present

## 2019-07-07 DIAGNOSIS — Z515 Encounter for palliative care: Secondary | ICD-10-CM | POA: Diagnosis not present

## 2019-07-07 DIAGNOSIS — I4891 Unspecified atrial fibrillation: Secondary | ICD-10-CM | POA: Diagnosis not present

## 2019-07-07 DIAGNOSIS — I10 Essential (primary) hypertension: Secondary | ICD-10-CM | POA: Diagnosis not present

## 2019-07-07 DIAGNOSIS — I509 Heart failure, unspecified: Secondary | ICD-10-CM | POA: Diagnosis not present

## 2019-07-07 DIAGNOSIS — R32 Unspecified urinary incontinence: Secondary | ICD-10-CM | POA: Diagnosis not present

## 2019-07-07 DIAGNOSIS — E2749 Other adrenocortical insufficiency: Secondary | ICD-10-CM | POA: Diagnosis not present

## 2019-07-08 DIAGNOSIS — I10 Essential (primary) hypertension: Secondary | ICD-10-CM | POA: Diagnosis not present

## 2019-07-08 DIAGNOSIS — I509 Heart failure, unspecified: Secondary | ICD-10-CM | POA: Diagnosis not present

## 2019-07-08 DIAGNOSIS — I4891 Unspecified atrial fibrillation: Secondary | ICD-10-CM | POA: Diagnosis not present

## 2019-07-08 DIAGNOSIS — Z515 Encounter for palliative care: Secondary | ICD-10-CM | POA: Diagnosis not present

## 2019-07-08 DIAGNOSIS — E2749 Other adrenocortical insufficiency: Secondary | ICD-10-CM | POA: Diagnosis not present

## 2019-07-08 DIAGNOSIS — J9610 Chronic respiratory failure, unspecified whether with hypoxia or hypercapnia: Secondary | ICD-10-CM | POA: Diagnosis not present

## 2019-07-08 DIAGNOSIS — R159 Full incontinence of feces: Secondary | ICD-10-CM | POA: Diagnosis not present

## 2019-07-08 DIAGNOSIS — R32 Unspecified urinary incontinence: Secondary | ICD-10-CM | POA: Diagnosis not present

## 2019-07-09 DIAGNOSIS — R159 Full incontinence of feces: Secondary | ICD-10-CM | POA: Diagnosis not present

## 2019-07-09 DIAGNOSIS — E2749 Other adrenocortical insufficiency: Secondary | ICD-10-CM | POA: Diagnosis not present

## 2019-07-09 DIAGNOSIS — I509 Heart failure, unspecified: Secondary | ICD-10-CM | POA: Diagnosis not present

## 2019-07-09 DIAGNOSIS — R32 Unspecified urinary incontinence: Secondary | ICD-10-CM | POA: Diagnosis not present

## 2019-07-09 DIAGNOSIS — I10 Essential (primary) hypertension: Secondary | ICD-10-CM | POA: Diagnosis not present

## 2019-07-09 DIAGNOSIS — I4891 Unspecified atrial fibrillation: Secondary | ICD-10-CM | POA: Diagnosis not present

## 2019-07-09 DIAGNOSIS — Z515 Encounter for palliative care: Secondary | ICD-10-CM | POA: Diagnosis not present

## 2019-07-09 DIAGNOSIS — J9610 Chronic respiratory failure, unspecified whether with hypoxia or hypercapnia: Secondary | ICD-10-CM | POA: Diagnosis not present

## 2019-07-10 DIAGNOSIS — I4891 Unspecified atrial fibrillation: Secondary | ICD-10-CM | POA: Diagnosis not present

## 2019-07-10 DIAGNOSIS — I10 Essential (primary) hypertension: Secondary | ICD-10-CM | POA: Diagnosis not present

## 2019-07-10 DIAGNOSIS — R32 Unspecified urinary incontinence: Secondary | ICD-10-CM | POA: Diagnosis not present

## 2019-07-10 DIAGNOSIS — R159 Full incontinence of feces: Secondary | ICD-10-CM | POA: Diagnosis not present

## 2019-07-10 DIAGNOSIS — E2749 Other adrenocortical insufficiency: Secondary | ICD-10-CM | POA: Diagnosis not present

## 2019-07-10 DIAGNOSIS — I509 Heart failure, unspecified: Secondary | ICD-10-CM | POA: Diagnosis not present

## 2019-07-10 DIAGNOSIS — J9610 Chronic respiratory failure, unspecified whether with hypoxia or hypercapnia: Secondary | ICD-10-CM | POA: Diagnosis not present

## 2019-07-10 DIAGNOSIS — Z515 Encounter for palliative care: Secondary | ICD-10-CM | POA: Diagnosis not present

## 2019-07-11 DIAGNOSIS — I4891 Unspecified atrial fibrillation: Secondary | ICD-10-CM | POA: Diagnosis not present

## 2019-07-11 DIAGNOSIS — I509 Heart failure, unspecified: Secondary | ICD-10-CM | POA: Diagnosis not present

## 2019-07-11 DIAGNOSIS — Z515 Encounter for palliative care: Secondary | ICD-10-CM | POA: Diagnosis not present

## 2019-07-11 DIAGNOSIS — J9610 Chronic respiratory failure, unspecified whether with hypoxia or hypercapnia: Secondary | ICD-10-CM | POA: Diagnosis not present

## 2019-07-11 DIAGNOSIS — I10 Essential (primary) hypertension: Secondary | ICD-10-CM | POA: Diagnosis not present

## 2019-07-11 DIAGNOSIS — R159 Full incontinence of feces: Secondary | ICD-10-CM | POA: Diagnosis not present

## 2019-07-11 DIAGNOSIS — R32 Unspecified urinary incontinence: Secondary | ICD-10-CM | POA: Diagnosis not present

## 2019-07-11 DIAGNOSIS — E2749 Other adrenocortical insufficiency: Secondary | ICD-10-CM | POA: Diagnosis not present

## 2019-07-12 DIAGNOSIS — I4891 Unspecified atrial fibrillation: Secondary | ICD-10-CM | POA: Diagnosis not present

## 2019-07-12 DIAGNOSIS — I10 Essential (primary) hypertension: Secondary | ICD-10-CM | POA: Diagnosis not present

## 2019-07-12 DIAGNOSIS — I509 Heart failure, unspecified: Secondary | ICD-10-CM | POA: Diagnosis not present

## 2019-07-12 DIAGNOSIS — L0390 Cellulitis, unspecified: Secondary | ICD-10-CM | POA: Diagnosis not present

## 2019-07-12 DIAGNOSIS — M810 Age-related osteoporosis without current pathological fracture: Secondary | ICD-10-CM | POA: Diagnosis not present

## 2019-07-12 DIAGNOSIS — Z515 Encounter for palliative care: Secondary | ICD-10-CM | POA: Diagnosis not present

## 2019-07-12 DIAGNOSIS — R32 Unspecified urinary incontinence: Secondary | ICD-10-CM | POA: Diagnosis not present

## 2019-07-12 DIAGNOSIS — R159 Full incontinence of feces: Secondary | ICD-10-CM | POA: Diagnosis not present

## 2019-07-12 DIAGNOSIS — E249 Cushing's syndrome, unspecified: Secondary | ICD-10-CM | POA: Diagnosis not present

## 2019-07-12 DIAGNOSIS — J9610 Chronic respiratory failure, unspecified whether with hypoxia or hypercapnia: Secondary | ICD-10-CM | POA: Diagnosis not present

## 2019-07-12 DIAGNOSIS — E2749 Other adrenocortical insufficiency: Secondary | ICD-10-CM | POA: Diagnosis not present

## 2019-07-13 DIAGNOSIS — E2749 Other adrenocortical insufficiency: Secondary | ICD-10-CM | POA: Diagnosis not present

## 2019-07-13 DIAGNOSIS — I509 Heart failure, unspecified: Secondary | ICD-10-CM | POA: Diagnosis not present

## 2019-07-13 DIAGNOSIS — I10 Essential (primary) hypertension: Secondary | ICD-10-CM | POA: Diagnosis not present

## 2019-07-13 DIAGNOSIS — Z515 Encounter for palliative care: Secondary | ICD-10-CM | POA: Diagnosis not present

## 2019-07-13 DIAGNOSIS — R32 Unspecified urinary incontinence: Secondary | ICD-10-CM | POA: Diagnosis not present

## 2019-07-13 DIAGNOSIS — R159 Full incontinence of feces: Secondary | ICD-10-CM | POA: Diagnosis not present

## 2019-07-13 DIAGNOSIS — J9610 Chronic respiratory failure, unspecified whether with hypoxia or hypercapnia: Secondary | ICD-10-CM | POA: Diagnosis not present

## 2019-07-13 DIAGNOSIS — I4891 Unspecified atrial fibrillation: Secondary | ICD-10-CM | POA: Diagnosis not present

## 2019-07-14 DIAGNOSIS — I509 Heart failure, unspecified: Secondary | ICD-10-CM | POA: Diagnosis not present

## 2019-07-14 DIAGNOSIS — J9610 Chronic respiratory failure, unspecified whether with hypoxia or hypercapnia: Secondary | ICD-10-CM | POA: Diagnosis not present

## 2019-07-14 DIAGNOSIS — R159 Full incontinence of feces: Secondary | ICD-10-CM | POA: Diagnosis not present

## 2019-07-14 DIAGNOSIS — R32 Unspecified urinary incontinence: Secondary | ICD-10-CM | POA: Diagnosis not present

## 2019-07-14 DIAGNOSIS — E2749 Other adrenocortical insufficiency: Secondary | ICD-10-CM | POA: Diagnosis not present

## 2019-07-14 DIAGNOSIS — I4891 Unspecified atrial fibrillation: Secondary | ICD-10-CM | POA: Diagnosis not present

## 2019-07-14 DIAGNOSIS — Z515 Encounter for palliative care: Secondary | ICD-10-CM | POA: Diagnosis not present

## 2019-07-14 DIAGNOSIS — I10 Essential (primary) hypertension: Secondary | ICD-10-CM | POA: Diagnosis not present

## 2019-07-15 DIAGNOSIS — I4891 Unspecified atrial fibrillation: Secondary | ICD-10-CM | POA: Diagnosis not present

## 2019-07-15 DIAGNOSIS — Z515 Encounter for palliative care: Secondary | ICD-10-CM | POA: Diagnosis not present

## 2019-07-15 DIAGNOSIS — R159 Full incontinence of feces: Secondary | ICD-10-CM | POA: Diagnosis not present

## 2019-07-15 DIAGNOSIS — I10 Essential (primary) hypertension: Secondary | ICD-10-CM | POA: Diagnosis not present

## 2019-07-15 DIAGNOSIS — R32 Unspecified urinary incontinence: Secondary | ICD-10-CM | POA: Diagnosis not present

## 2019-07-15 DIAGNOSIS — J9610 Chronic respiratory failure, unspecified whether with hypoxia or hypercapnia: Secondary | ICD-10-CM | POA: Diagnosis not present

## 2019-07-15 DIAGNOSIS — E2749 Other adrenocortical insufficiency: Secondary | ICD-10-CM | POA: Diagnosis not present

## 2019-07-15 DIAGNOSIS — I509 Heart failure, unspecified: Secondary | ICD-10-CM | POA: Diagnosis not present

## 2019-07-16 DIAGNOSIS — E2749 Other adrenocortical insufficiency: Secondary | ICD-10-CM | POA: Diagnosis not present

## 2019-07-16 DIAGNOSIS — I10 Essential (primary) hypertension: Secondary | ICD-10-CM | POA: Diagnosis not present

## 2019-07-16 DIAGNOSIS — I4891 Unspecified atrial fibrillation: Secondary | ICD-10-CM | POA: Diagnosis not present

## 2019-07-16 DIAGNOSIS — I509 Heart failure, unspecified: Secondary | ICD-10-CM | POA: Diagnosis not present

## 2019-07-16 DIAGNOSIS — Z515 Encounter for palliative care: Secondary | ICD-10-CM | POA: Diagnosis not present

## 2019-07-16 DIAGNOSIS — J9610 Chronic respiratory failure, unspecified whether with hypoxia or hypercapnia: Secondary | ICD-10-CM | POA: Diagnosis not present

## 2019-07-16 DIAGNOSIS — R32 Unspecified urinary incontinence: Secondary | ICD-10-CM | POA: Diagnosis not present

## 2019-07-16 DIAGNOSIS — R159 Full incontinence of feces: Secondary | ICD-10-CM | POA: Diagnosis not present

## 2019-07-17 DIAGNOSIS — Z515 Encounter for palliative care: Secondary | ICD-10-CM | POA: Diagnosis not present

## 2019-07-17 DIAGNOSIS — I4891 Unspecified atrial fibrillation: Secondary | ICD-10-CM | POA: Diagnosis not present

## 2019-07-17 DIAGNOSIS — E2749 Other adrenocortical insufficiency: Secondary | ICD-10-CM | POA: Diagnosis not present

## 2019-07-17 DIAGNOSIS — J9610 Chronic respiratory failure, unspecified whether with hypoxia or hypercapnia: Secondary | ICD-10-CM | POA: Diagnosis not present

## 2019-07-17 DIAGNOSIS — R159 Full incontinence of feces: Secondary | ICD-10-CM | POA: Diagnosis not present

## 2019-07-17 DIAGNOSIS — I509 Heart failure, unspecified: Secondary | ICD-10-CM | POA: Diagnosis not present

## 2019-07-17 DIAGNOSIS — I10 Essential (primary) hypertension: Secondary | ICD-10-CM | POA: Diagnosis not present

## 2019-07-17 DIAGNOSIS — R32 Unspecified urinary incontinence: Secondary | ICD-10-CM | POA: Diagnosis not present

## 2019-07-18 DIAGNOSIS — J9610 Chronic respiratory failure, unspecified whether with hypoxia or hypercapnia: Secondary | ICD-10-CM | POA: Diagnosis not present

## 2019-07-18 DIAGNOSIS — Z515 Encounter for palliative care: Secondary | ICD-10-CM | POA: Diagnosis not present

## 2019-07-18 DIAGNOSIS — E2749 Other adrenocortical insufficiency: Secondary | ICD-10-CM | POA: Diagnosis not present

## 2019-07-18 DIAGNOSIS — R159 Full incontinence of feces: Secondary | ICD-10-CM | POA: Diagnosis not present

## 2019-07-18 DIAGNOSIS — I509 Heart failure, unspecified: Secondary | ICD-10-CM | POA: Diagnosis not present

## 2019-07-18 DIAGNOSIS — I4891 Unspecified atrial fibrillation: Secondary | ICD-10-CM | POA: Diagnosis not present

## 2019-07-18 DIAGNOSIS — R32 Unspecified urinary incontinence: Secondary | ICD-10-CM | POA: Diagnosis not present

## 2019-07-18 DIAGNOSIS — I10 Essential (primary) hypertension: Secondary | ICD-10-CM | POA: Diagnosis not present

## 2019-07-19 DIAGNOSIS — E2749 Other adrenocortical insufficiency: Secondary | ICD-10-CM | POA: Diagnosis not present

## 2019-07-19 DIAGNOSIS — I4891 Unspecified atrial fibrillation: Secondary | ICD-10-CM | POA: Diagnosis not present

## 2019-07-19 DIAGNOSIS — I10 Essential (primary) hypertension: Secondary | ICD-10-CM | POA: Diagnosis not present

## 2019-07-19 DIAGNOSIS — R159 Full incontinence of feces: Secondary | ICD-10-CM | POA: Diagnosis not present

## 2019-07-19 DIAGNOSIS — I509 Heart failure, unspecified: Secondary | ICD-10-CM | POA: Diagnosis not present

## 2019-07-19 DIAGNOSIS — R32 Unspecified urinary incontinence: Secondary | ICD-10-CM | POA: Diagnosis not present

## 2019-07-19 DIAGNOSIS — Z515 Encounter for palliative care: Secondary | ICD-10-CM | POA: Diagnosis not present

## 2019-07-19 DIAGNOSIS — J9610 Chronic respiratory failure, unspecified whether with hypoxia or hypercapnia: Secondary | ICD-10-CM | POA: Diagnosis not present

## 2019-07-20 DIAGNOSIS — I10 Essential (primary) hypertension: Secondary | ICD-10-CM | POA: Diagnosis not present

## 2019-07-20 DIAGNOSIS — R32 Unspecified urinary incontinence: Secondary | ICD-10-CM | POA: Diagnosis not present

## 2019-07-20 DIAGNOSIS — Z515 Encounter for palliative care: Secondary | ICD-10-CM | POA: Diagnosis not present

## 2019-07-20 DIAGNOSIS — I509 Heart failure, unspecified: Secondary | ICD-10-CM | POA: Diagnosis not present

## 2019-07-20 DIAGNOSIS — I4891 Unspecified atrial fibrillation: Secondary | ICD-10-CM | POA: Diagnosis not present

## 2019-07-20 DIAGNOSIS — J9610 Chronic respiratory failure, unspecified whether with hypoxia or hypercapnia: Secondary | ICD-10-CM | POA: Diagnosis not present

## 2019-07-20 DIAGNOSIS — R159 Full incontinence of feces: Secondary | ICD-10-CM | POA: Diagnosis not present

## 2019-07-20 DIAGNOSIS — E2749 Other adrenocortical insufficiency: Secondary | ICD-10-CM | POA: Diagnosis not present

## 2019-07-21 ENCOUNTER — Ambulatory Visit: Payer: BC Managed Care – PPO | Admitting: Cardiology

## 2019-07-21 DIAGNOSIS — R32 Unspecified urinary incontinence: Secondary | ICD-10-CM | POA: Diagnosis not present

## 2019-07-21 DIAGNOSIS — I4891 Unspecified atrial fibrillation: Secondary | ICD-10-CM | POA: Diagnosis not present

## 2019-07-21 DIAGNOSIS — J449 Chronic obstructive pulmonary disease, unspecified: Secondary | ICD-10-CM | POA: Diagnosis not present

## 2019-07-21 DIAGNOSIS — Z515 Encounter for palliative care: Secondary | ICD-10-CM | POA: Diagnosis not present

## 2019-07-21 DIAGNOSIS — R159 Full incontinence of feces: Secondary | ICD-10-CM | POA: Diagnosis not present

## 2019-07-21 DIAGNOSIS — I509 Heart failure, unspecified: Secondary | ICD-10-CM | POA: Diagnosis not present

## 2019-07-21 DIAGNOSIS — J9610 Chronic respiratory failure, unspecified whether with hypoxia or hypercapnia: Secondary | ICD-10-CM | POA: Diagnosis not present

## 2019-07-21 DIAGNOSIS — E2749 Other adrenocortical insufficiency: Secondary | ICD-10-CM | POA: Diagnosis not present

## 2019-07-21 DIAGNOSIS — I10 Essential (primary) hypertension: Secondary | ICD-10-CM | POA: Diagnosis not present

## 2019-07-22 ENCOUNTER — Inpatient Hospital Stay (HOSPITAL_COMMUNITY)
Admission: RE | Admit: 2019-07-22 | Discharge: 2019-07-22 | Disposition: A | Discharge status: Home / Self Care | Payer: BC Managed Care – PPO | Source: Ambulatory Visit | Attending: Internal Medicine | Admitting: Internal Medicine

## 2019-07-22 DIAGNOSIS — R32 Unspecified urinary incontinence: Secondary | ICD-10-CM | POA: Diagnosis not present

## 2019-07-22 DIAGNOSIS — R159 Full incontinence of feces: Secondary | ICD-10-CM | POA: Diagnosis not present

## 2019-07-22 DIAGNOSIS — J9610 Chronic respiratory failure, unspecified whether with hypoxia or hypercapnia: Secondary | ICD-10-CM | POA: Diagnosis not present

## 2019-07-22 DIAGNOSIS — I10 Essential (primary) hypertension: Secondary | ICD-10-CM | POA: Diagnosis not present

## 2019-07-22 DIAGNOSIS — E2749 Other adrenocortical insufficiency: Secondary | ICD-10-CM | POA: Diagnosis not present

## 2019-07-22 DIAGNOSIS — Z515 Encounter for palliative care: Secondary | ICD-10-CM | POA: Diagnosis not present

## 2019-07-22 DIAGNOSIS — I4891 Unspecified atrial fibrillation: Secondary | ICD-10-CM | POA: Diagnosis not present

## 2019-07-22 DIAGNOSIS — I509 Heart failure, unspecified: Secondary | ICD-10-CM | POA: Diagnosis not present

## 2019-07-23 ENCOUNTER — Inpatient Hospital Stay (HOSPITAL_COMMUNITY): Admission: RE | Admit: 2019-07-23 | Payer: BC Managed Care – PPO | Source: Ambulatory Visit

## 2019-07-23 DIAGNOSIS — Z515 Encounter for palliative care: Secondary | ICD-10-CM | POA: Diagnosis not present

## 2019-07-23 DIAGNOSIS — R32 Unspecified urinary incontinence: Secondary | ICD-10-CM | POA: Diagnosis not present

## 2019-07-23 DIAGNOSIS — I509 Heart failure, unspecified: Secondary | ICD-10-CM | POA: Diagnosis not present

## 2019-07-23 DIAGNOSIS — R159 Full incontinence of feces: Secondary | ICD-10-CM | POA: Diagnosis not present

## 2019-07-23 DIAGNOSIS — I10 Essential (primary) hypertension: Secondary | ICD-10-CM | POA: Diagnosis not present

## 2019-07-23 DIAGNOSIS — J9610 Chronic respiratory failure, unspecified whether with hypoxia or hypercapnia: Secondary | ICD-10-CM | POA: Diagnosis not present

## 2019-07-23 DIAGNOSIS — E2749 Other adrenocortical insufficiency: Secondary | ICD-10-CM | POA: Diagnosis not present

## 2019-07-23 DIAGNOSIS — I4891 Unspecified atrial fibrillation: Secondary | ICD-10-CM | POA: Diagnosis not present

## 2019-07-24 DIAGNOSIS — R32 Unspecified urinary incontinence: Secondary | ICD-10-CM | POA: Diagnosis not present

## 2019-07-24 DIAGNOSIS — I4891 Unspecified atrial fibrillation: Secondary | ICD-10-CM | POA: Diagnosis not present

## 2019-07-24 DIAGNOSIS — J9610 Chronic respiratory failure, unspecified whether with hypoxia or hypercapnia: Secondary | ICD-10-CM | POA: Diagnosis not present

## 2019-07-24 DIAGNOSIS — R159 Full incontinence of feces: Secondary | ICD-10-CM | POA: Diagnosis not present

## 2019-07-24 DIAGNOSIS — I509 Heart failure, unspecified: Secondary | ICD-10-CM | POA: Diagnosis not present

## 2019-07-24 DIAGNOSIS — I10 Essential (primary) hypertension: Secondary | ICD-10-CM | POA: Diagnosis not present

## 2019-07-24 DIAGNOSIS — Z515 Encounter for palliative care: Secondary | ICD-10-CM | POA: Diagnosis not present

## 2019-07-24 DIAGNOSIS — E2749 Other adrenocortical insufficiency: Secondary | ICD-10-CM | POA: Diagnosis not present

## 2019-07-25 DIAGNOSIS — E2749 Other adrenocortical insufficiency: Secondary | ICD-10-CM | POA: Diagnosis not present

## 2019-07-25 DIAGNOSIS — Z515 Encounter for palliative care: Secondary | ICD-10-CM | POA: Diagnosis not present

## 2019-07-25 DIAGNOSIS — I509 Heart failure, unspecified: Secondary | ICD-10-CM | POA: Diagnosis not present

## 2019-07-25 DIAGNOSIS — R159 Full incontinence of feces: Secondary | ICD-10-CM | POA: Diagnosis not present

## 2019-07-25 DIAGNOSIS — I10 Essential (primary) hypertension: Secondary | ICD-10-CM | POA: Diagnosis not present

## 2019-07-25 DIAGNOSIS — J9610 Chronic respiratory failure, unspecified whether with hypoxia or hypercapnia: Secondary | ICD-10-CM | POA: Diagnosis not present

## 2019-07-25 DIAGNOSIS — R32 Unspecified urinary incontinence: Secondary | ICD-10-CM | POA: Diagnosis not present

## 2019-07-25 DIAGNOSIS — I4891 Unspecified atrial fibrillation: Secondary | ICD-10-CM | POA: Diagnosis not present

## 2019-07-26 DIAGNOSIS — I10 Essential (primary) hypertension: Secondary | ICD-10-CM | POA: Diagnosis not present

## 2019-07-26 DIAGNOSIS — E2749 Other adrenocortical insufficiency: Secondary | ICD-10-CM | POA: Diagnosis not present

## 2019-07-26 DIAGNOSIS — Z515 Encounter for palliative care: Secondary | ICD-10-CM | POA: Diagnosis not present

## 2019-07-26 DIAGNOSIS — I509 Heart failure, unspecified: Secondary | ICD-10-CM | POA: Diagnosis not present

## 2019-07-26 DIAGNOSIS — I4891 Unspecified atrial fibrillation: Secondary | ICD-10-CM | POA: Diagnosis not present

## 2019-07-26 DIAGNOSIS — R32 Unspecified urinary incontinence: Secondary | ICD-10-CM | POA: Diagnosis not present

## 2019-07-26 DIAGNOSIS — R159 Full incontinence of feces: Secondary | ICD-10-CM | POA: Diagnosis not present

## 2019-07-26 DIAGNOSIS — J9610 Chronic respiratory failure, unspecified whether with hypoxia or hypercapnia: Secondary | ICD-10-CM | POA: Diagnosis not present

## 2019-07-27 ENCOUNTER — Other Ambulatory Visit (HOSPITAL_COMMUNITY): Payer: Self-pay | Admitting: *Deleted

## 2019-07-27 DIAGNOSIS — I10 Essential (primary) hypertension: Secondary | ICD-10-CM | POA: Diagnosis not present

## 2019-07-27 DIAGNOSIS — R159 Full incontinence of feces: Secondary | ICD-10-CM | POA: Diagnosis not present

## 2019-07-27 DIAGNOSIS — R32 Unspecified urinary incontinence: Secondary | ICD-10-CM | POA: Diagnosis not present

## 2019-07-27 DIAGNOSIS — E2749 Other adrenocortical insufficiency: Secondary | ICD-10-CM | POA: Diagnosis not present

## 2019-07-27 DIAGNOSIS — J9610 Chronic respiratory failure, unspecified whether with hypoxia or hypercapnia: Secondary | ICD-10-CM | POA: Diagnosis not present

## 2019-07-27 DIAGNOSIS — Z515 Encounter for palliative care: Secondary | ICD-10-CM | POA: Diagnosis not present

## 2019-07-27 DIAGNOSIS — I4891 Unspecified atrial fibrillation: Secondary | ICD-10-CM | POA: Diagnosis not present

## 2019-07-27 DIAGNOSIS — I509 Heart failure, unspecified: Secondary | ICD-10-CM | POA: Diagnosis not present

## 2019-07-28 ENCOUNTER — Encounter (HOSPITAL_COMMUNITY): Payer: BC Managed Care – PPO

## 2019-07-28 DIAGNOSIS — I4891 Unspecified atrial fibrillation: Secondary | ICD-10-CM | POA: Diagnosis not present

## 2019-07-28 DIAGNOSIS — J9610 Chronic respiratory failure, unspecified whether with hypoxia or hypercapnia: Secondary | ICD-10-CM | POA: Diagnosis not present

## 2019-07-28 DIAGNOSIS — I10 Essential (primary) hypertension: Secondary | ICD-10-CM | POA: Diagnosis not present

## 2019-07-28 DIAGNOSIS — Z515 Encounter for palliative care: Secondary | ICD-10-CM | POA: Diagnosis not present

## 2019-07-28 DIAGNOSIS — R32 Unspecified urinary incontinence: Secondary | ICD-10-CM | POA: Diagnosis not present

## 2019-07-28 DIAGNOSIS — R159 Full incontinence of feces: Secondary | ICD-10-CM | POA: Diagnosis not present

## 2019-07-28 DIAGNOSIS — I509 Heart failure, unspecified: Secondary | ICD-10-CM | POA: Diagnosis not present

## 2019-07-28 DIAGNOSIS — E2749 Other adrenocortical insufficiency: Secondary | ICD-10-CM | POA: Diagnosis not present

## 2019-07-29 DIAGNOSIS — J9610 Chronic respiratory failure, unspecified whether with hypoxia or hypercapnia: Secondary | ICD-10-CM | POA: Diagnosis not present

## 2019-07-29 DIAGNOSIS — Z515 Encounter for palliative care: Secondary | ICD-10-CM | POA: Diagnosis not present

## 2019-07-29 DIAGNOSIS — R159 Full incontinence of feces: Secondary | ICD-10-CM | POA: Diagnosis not present

## 2019-07-29 DIAGNOSIS — I10 Essential (primary) hypertension: Secondary | ICD-10-CM | POA: Diagnosis not present

## 2019-07-29 DIAGNOSIS — I4891 Unspecified atrial fibrillation: Secondary | ICD-10-CM | POA: Diagnosis not present

## 2019-07-29 DIAGNOSIS — E2749 Other adrenocortical insufficiency: Secondary | ICD-10-CM | POA: Diagnosis not present

## 2019-07-29 DIAGNOSIS — I509 Heart failure, unspecified: Secondary | ICD-10-CM | POA: Diagnosis not present

## 2019-07-29 DIAGNOSIS — R32 Unspecified urinary incontinence: Secondary | ICD-10-CM | POA: Diagnosis not present

## 2019-07-30 DIAGNOSIS — I509 Heart failure, unspecified: Secondary | ICD-10-CM | POA: Diagnosis not present

## 2019-07-30 DIAGNOSIS — I4891 Unspecified atrial fibrillation: Secondary | ICD-10-CM | POA: Diagnosis not present

## 2019-07-30 DIAGNOSIS — I10 Essential (primary) hypertension: Secondary | ICD-10-CM | POA: Diagnosis not present

## 2019-07-30 DIAGNOSIS — J9610 Chronic respiratory failure, unspecified whether with hypoxia or hypercapnia: Secondary | ICD-10-CM | POA: Diagnosis not present

## 2019-07-30 DIAGNOSIS — Z515 Encounter for palliative care: Secondary | ICD-10-CM | POA: Diagnosis not present

## 2019-07-30 DIAGNOSIS — R32 Unspecified urinary incontinence: Secondary | ICD-10-CM | POA: Diagnosis not present

## 2019-07-30 DIAGNOSIS — E2749 Other adrenocortical insufficiency: Secondary | ICD-10-CM | POA: Diagnosis not present

## 2019-07-30 DIAGNOSIS — R159 Full incontinence of feces: Secondary | ICD-10-CM | POA: Diagnosis not present

## 2019-07-31 DIAGNOSIS — R159 Full incontinence of feces: Secondary | ICD-10-CM | POA: Diagnosis not present

## 2019-07-31 DIAGNOSIS — R32 Unspecified urinary incontinence: Secondary | ICD-10-CM | POA: Diagnosis not present

## 2019-07-31 DIAGNOSIS — I10 Essential (primary) hypertension: Secondary | ICD-10-CM | POA: Diagnosis not present

## 2019-07-31 DIAGNOSIS — J9610 Chronic respiratory failure, unspecified whether with hypoxia or hypercapnia: Secondary | ICD-10-CM | POA: Diagnosis not present

## 2019-07-31 DIAGNOSIS — I4891 Unspecified atrial fibrillation: Secondary | ICD-10-CM | POA: Diagnosis not present

## 2019-07-31 DIAGNOSIS — E2749 Other adrenocortical insufficiency: Secondary | ICD-10-CM | POA: Diagnosis not present

## 2019-07-31 DIAGNOSIS — Z515 Encounter for palliative care: Secondary | ICD-10-CM | POA: Diagnosis not present

## 2019-07-31 DIAGNOSIS — I509 Heart failure, unspecified: Secondary | ICD-10-CM | POA: Diagnosis not present

## 2019-08-01 DIAGNOSIS — I10 Essential (primary) hypertension: Secondary | ICD-10-CM | POA: Diagnosis not present

## 2019-08-01 DIAGNOSIS — R159 Full incontinence of feces: Secondary | ICD-10-CM | POA: Diagnosis not present

## 2019-08-01 DIAGNOSIS — J9610 Chronic respiratory failure, unspecified whether with hypoxia or hypercapnia: Secondary | ICD-10-CM | POA: Diagnosis not present

## 2019-08-01 DIAGNOSIS — I509 Heart failure, unspecified: Secondary | ICD-10-CM | POA: Diagnosis not present

## 2019-08-01 DIAGNOSIS — R32 Unspecified urinary incontinence: Secondary | ICD-10-CM | POA: Diagnosis not present

## 2019-08-01 DIAGNOSIS — Z515 Encounter for palliative care: Secondary | ICD-10-CM | POA: Diagnosis not present

## 2019-08-01 DIAGNOSIS — I4891 Unspecified atrial fibrillation: Secondary | ICD-10-CM | POA: Diagnosis not present

## 2019-08-01 DIAGNOSIS — E2749 Other adrenocortical insufficiency: Secondary | ICD-10-CM | POA: Diagnosis not present

## 2019-08-02 DIAGNOSIS — R159 Full incontinence of feces: Secondary | ICD-10-CM | POA: Diagnosis not present

## 2019-08-02 DIAGNOSIS — I4891 Unspecified atrial fibrillation: Secondary | ICD-10-CM | POA: Diagnosis not present

## 2019-08-02 DIAGNOSIS — I509 Heart failure, unspecified: Secondary | ICD-10-CM | POA: Diagnosis not present

## 2019-08-02 DIAGNOSIS — I10 Essential (primary) hypertension: Secondary | ICD-10-CM | POA: Diagnosis not present

## 2019-08-02 DIAGNOSIS — E2749 Other adrenocortical insufficiency: Secondary | ICD-10-CM | POA: Diagnosis not present

## 2019-08-02 DIAGNOSIS — R32 Unspecified urinary incontinence: Secondary | ICD-10-CM | POA: Diagnosis not present

## 2019-08-02 DIAGNOSIS — J9610 Chronic respiratory failure, unspecified whether with hypoxia or hypercapnia: Secondary | ICD-10-CM | POA: Diagnosis not present

## 2019-08-02 DIAGNOSIS — Z515 Encounter for palliative care: Secondary | ICD-10-CM | POA: Diagnosis not present

## 2019-08-03 DIAGNOSIS — I509 Heart failure, unspecified: Secondary | ICD-10-CM | POA: Diagnosis not present

## 2019-08-03 DIAGNOSIS — I10 Essential (primary) hypertension: Secondary | ICD-10-CM | POA: Diagnosis not present

## 2019-08-03 DIAGNOSIS — E2749 Other adrenocortical insufficiency: Secondary | ICD-10-CM | POA: Diagnosis not present

## 2019-08-03 DIAGNOSIS — R159 Full incontinence of feces: Secondary | ICD-10-CM | POA: Diagnosis not present

## 2019-08-03 DIAGNOSIS — R32 Unspecified urinary incontinence: Secondary | ICD-10-CM | POA: Diagnosis not present

## 2019-08-03 DIAGNOSIS — Z515 Encounter for palliative care: Secondary | ICD-10-CM | POA: Diagnosis not present

## 2019-08-03 DIAGNOSIS — J9610 Chronic respiratory failure, unspecified whether with hypoxia or hypercapnia: Secondary | ICD-10-CM | POA: Diagnosis not present

## 2019-08-03 DIAGNOSIS — I4891 Unspecified atrial fibrillation: Secondary | ICD-10-CM | POA: Diagnosis not present

## 2019-08-04 DIAGNOSIS — I10 Essential (primary) hypertension: Secondary | ICD-10-CM | POA: Diagnosis not present

## 2019-08-04 DIAGNOSIS — J9610 Chronic respiratory failure, unspecified whether with hypoxia or hypercapnia: Secondary | ICD-10-CM | POA: Diagnosis not present

## 2019-08-04 DIAGNOSIS — I509 Heart failure, unspecified: Secondary | ICD-10-CM | POA: Diagnosis not present

## 2019-08-04 DIAGNOSIS — E2740 Unspecified adrenocortical insufficiency: Secondary | ICD-10-CM | POA: Diagnosis not present

## 2019-08-04 DIAGNOSIS — R32 Unspecified urinary incontinence: Secondary | ICD-10-CM | POA: Diagnosis not present

## 2019-08-04 DIAGNOSIS — R159 Full incontinence of feces: Secondary | ICD-10-CM | POA: Diagnosis not present

## 2019-08-04 DIAGNOSIS — E2749 Other adrenocortical insufficiency: Secondary | ICD-10-CM | POA: Diagnosis not present

## 2019-08-04 DIAGNOSIS — I4891 Unspecified atrial fibrillation: Secondary | ICD-10-CM | POA: Diagnosis not present

## 2019-08-04 DIAGNOSIS — Z515 Encounter for palliative care: Secondary | ICD-10-CM | POA: Diagnosis not present

## 2019-08-04 DIAGNOSIS — K625 Hemorrhage of anus and rectum: Secondary | ICD-10-CM | POA: Diagnosis not present

## 2019-08-04 DIAGNOSIS — K5150 Left sided colitis without complications: Secondary | ICD-10-CM | POA: Diagnosis not present

## 2019-08-05 DIAGNOSIS — R159 Full incontinence of feces: Secondary | ICD-10-CM | POA: Diagnosis not present

## 2019-08-05 DIAGNOSIS — I509 Heart failure, unspecified: Secondary | ICD-10-CM | POA: Diagnosis not present

## 2019-08-05 DIAGNOSIS — Z515 Encounter for palliative care: Secondary | ICD-10-CM | POA: Diagnosis not present

## 2019-08-05 DIAGNOSIS — E2749 Other adrenocortical insufficiency: Secondary | ICD-10-CM | POA: Diagnosis not present

## 2019-08-05 DIAGNOSIS — I10 Essential (primary) hypertension: Secondary | ICD-10-CM | POA: Diagnosis not present

## 2019-08-05 DIAGNOSIS — R32 Unspecified urinary incontinence: Secondary | ICD-10-CM | POA: Diagnosis not present

## 2019-08-05 DIAGNOSIS — J9610 Chronic respiratory failure, unspecified whether with hypoxia or hypercapnia: Secondary | ICD-10-CM | POA: Diagnosis not present

## 2019-08-05 DIAGNOSIS — I4891 Unspecified atrial fibrillation: Secondary | ICD-10-CM | POA: Diagnosis not present

## 2019-08-06 DIAGNOSIS — J9610 Chronic respiratory failure, unspecified whether with hypoxia or hypercapnia: Secondary | ICD-10-CM | POA: Diagnosis not present

## 2019-08-06 DIAGNOSIS — I509 Heart failure, unspecified: Secondary | ICD-10-CM | POA: Diagnosis not present

## 2019-08-06 DIAGNOSIS — I4891 Unspecified atrial fibrillation: Secondary | ICD-10-CM | POA: Diagnosis not present

## 2019-08-06 DIAGNOSIS — E2749 Other adrenocortical insufficiency: Secondary | ICD-10-CM | POA: Diagnosis not present

## 2019-08-06 DIAGNOSIS — Z515 Encounter for palliative care: Secondary | ICD-10-CM | POA: Diagnosis not present

## 2019-08-06 DIAGNOSIS — R32 Unspecified urinary incontinence: Secondary | ICD-10-CM | POA: Diagnosis not present

## 2019-08-06 DIAGNOSIS — I10 Essential (primary) hypertension: Secondary | ICD-10-CM | POA: Diagnosis not present

## 2019-08-06 DIAGNOSIS — R159 Full incontinence of feces: Secondary | ICD-10-CM | POA: Diagnosis not present

## 2019-08-07 DIAGNOSIS — J9610 Chronic respiratory failure, unspecified whether with hypoxia or hypercapnia: Secondary | ICD-10-CM | POA: Diagnosis not present

## 2019-08-07 DIAGNOSIS — Z515 Encounter for palliative care: Secondary | ICD-10-CM | POA: Diagnosis not present

## 2019-08-07 DIAGNOSIS — I10 Essential (primary) hypertension: Secondary | ICD-10-CM | POA: Diagnosis not present

## 2019-08-07 DIAGNOSIS — E2749 Other adrenocortical insufficiency: Secondary | ICD-10-CM | POA: Diagnosis not present

## 2019-08-07 DIAGNOSIS — R32 Unspecified urinary incontinence: Secondary | ICD-10-CM | POA: Diagnosis not present

## 2019-08-07 DIAGNOSIS — I4891 Unspecified atrial fibrillation: Secondary | ICD-10-CM | POA: Diagnosis not present

## 2019-08-07 DIAGNOSIS — I509 Heart failure, unspecified: Secondary | ICD-10-CM | POA: Diagnosis not present

## 2019-08-07 DIAGNOSIS — R159 Full incontinence of feces: Secondary | ICD-10-CM | POA: Diagnosis not present

## 2019-08-08 DIAGNOSIS — I10 Essential (primary) hypertension: Secondary | ICD-10-CM | POA: Diagnosis not present

## 2019-08-08 DIAGNOSIS — Z515 Encounter for palliative care: Secondary | ICD-10-CM | POA: Diagnosis not present

## 2019-08-08 DIAGNOSIS — I509 Heart failure, unspecified: Secondary | ICD-10-CM | POA: Diagnosis not present

## 2019-08-08 DIAGNOSIS — J9610 Chronic respiratory failure, unspecified whether with hypoxia or hypercapnia: Secondary | ICD-10-CM | POA: Diagnosis not present

## 2019-08-08 DIAGNOSIS — R159 Full incontinence of feces: Secondary | ICD-10-CM | POA: Diagnosis not present

## 2019-08-08 DIAGNOSIS — E2749 Other adrenocortical insufficiency: Secondary | ICD-10-CM | POA: Diagnosis not present

## 2019-08-08 DIAGNOSIS — I4891 Unspecified atrial fibrillation: Secondary | ICD-10-CM | POA: Diagnosis not present

## 2019-08-08 DIAGNOSIS — R32 Unspecified urinary incontinence: Secondary | ICD-10-CM | POA: Diagnosis not present

## 2019-08-09 DIAGNOSIS — E2749 Other adrenocortical insufficiency: Secondary | ICD-10-CM | POA: Diagnosis not present

## 2019-08-09 DIAGNOSIS — I4891 Unspecified atrial fibrillation: Secondary | ICD-10-CM | POA: Diagnosis not present

## 2019-08-09 DIAGNOSIS — I509 Heart failure, unspecified: Secondary | ICD-10-CM | POA: Diagnosis not present

## 2019-08-09 DIAGNOSIS — I10 Essential (primary) hypertension: Secondary | ICD-10-CM | POA: Diagnosis not present

## 2019-08-09 DIAGNOSIS — R159 Full incontinence of feces: Secondary | ICD-10-CM | POA: Diagnosis not present

## 2019-08-09 DIAGNOSIS — R32 Unspecified urinary incontinence: Secondary | ICD-10-CM | POA: Diagnosis not present

## 2019-08-09 DIAGNOSIS — J9610 Chronic respiratory failure, unspecified whether with hypoxia or hypercapnia: Secondary | ICD-10-CM | POA: Diagnosis not present

## 2019-08-09 DIAGNOSIS — Z515 Encounter for palliative care: Secondary | ICD-10-CM | POA: Diagnosis not present

## 2019-08-10 DIAGNOSIS — K5150 Left sided colitis without complications: Secondary | ICD-10-CM | POA: Diagnosis not present

## 2019-08-10 DIAGNOSIS — J9610 Chronic respiratory failure, unspecified whether with hypoxia or hypercapnia: Secondary | ICD-10-CM | POA: Diagnosis not present

## 2019-08-10 DIAGNOSIS — I4891 Unspecified atrial fibrillation: Secondary | ICD-10-CM | POA: Diagnosis not present

## 2019-08-10 DIAGNOSIS — I10 Essential (primary) hypertension: Secondary | ICD-10-CM | POA: Diagnosis not present

## 2019-08-10 DIAGNOSIS — R32 Unspecified urinary incontinence: Secondary | ICD-10-CM | POA: Diagnosis not present

## 2019-08-10 DIAGNOSIS — E2749 Other adrenocortical insufficiency: Secondary | ICD-10-CM | POA: Diagnosis not present

## 2019-08-10 DIAGNOSIS — Z515 Encounter for palliative care: Secondary | ICD-10-CM | POA: Diagnosis not present

## 2019-08-10 DIAGNOSIS — K625 Hemorrhage of anus and rectum: Secondary | ICD-10-CM | POA: Diagnosis not present

## 2019-08-10 DIAGNOSIS — I509 Heart failure, unspecified: Secondary | ICD-10-CM | POA: Diagnosis not present

## 2019-08-10 DIAGNOSIS — R159 Full incontinence of feces: Secondary | ICD-10-CM | POA: Diagnosis not present

## 2019-08-11 ENCOUNTER — Other Ambulatory Visit (HOSPITAL_COMMUNITY): Payer: Self-pay | Admitting: *Deleted

## 2019-08-11 DIAGNOSIS — R32 Unspecified urinary incontinence: Secondary | ICD-10-CM | POA: Diagnosis not present

## 2019-08-11 DIAGNOSIS — I10 Essential (primary) hypertension: Secondary | ICD-10-CM | POA: Diagnosis not present

## 2019-08-11 DIAGNOSIS — R159 Full incontinence of feces: Secondary | ICD-10-CM | POA: Diagnosis not present

## 2019-08-11 DIAGNOSIS — Z515 Encounter for palliative care: Secondary | ICD-10-CM | POA: Diagnosis not present

## 2019-08-11 DIAGNOSIS — I509 Heart failure, unspecified: Secondary | ICD-10-CM | POA: Diagnosis not present

## 2019-08-11 DIAGNOSIS — I4891 Unspecified atrial fibrillation: Secondary | ICD-10-CM | POA: Diagnosis not present

## 2019-08-11 DIAGNOSIS — E2749 Other adrenocortical insufficiency: Secondary | ICD-10-CM | POA: Diagnosis not present

## 2019-08-11 DIAGNOSIS — J9610 Chronic respiratory failure, unspecified whether with hypoxia or hypercapnia: Secondary | ICD-10-CM | POA: Diagnosis not present

## 2019-08-12 ENCOUNTER — Encounter (HOSPITAL_COMMUNITY): Payer: BC Managed Care – PPO

## 2019-08-12 DIAGNOSIS — I4891 Unspecified atrial fibrillation: Secondary | ICD-10-CM | POA: Diagnosis not present

## 2019-08-12 DIAGNOSIS — E2749 Other adrenocortical insufficiency: Secondary | ICD-10-CM | POA: Diagnosis not present

## 2019-08-12 DIAGNOSIS — J9610 Chronic respiratory failure, unspecified whether with hypoxia or hypercapnia: Secondary | ICD-10-CM | POA: Diagnosis not present

## 2019-08-12 DIAGNOSIS — R159 Full incontinence of feces: Secondary | ICD-10-CM | POA: Diagnosis not present

## 2019-08-12 DIAGNOSIS — R32 Unspecified urinary incontinence: Secondary | ICD-10-CM | POA: Diagnosis not present

## 2019-08-12 DIAGNOSIS — I509 Heart failure, unspecified: Secondary | ICD-10-CM | POA: Diagnosis not present

## 2019-08-12 DIAGNOSIS — I10 Essential (primary) hypertension: Secondary | ICD-10-CM | POA: Diagnosis not present

## 2019-08-12 DIAGNOSIS — Z515 Encounter for palliative care: Secondary | ICD-10-CM | POA: Diagnosis not present

## 2019-08-13 DIAGNOSIS — Z515 Encounter for palliative care: Secondary | ICD-10-CM | POA: Diagnosis not present

## 2019-08-13 DIAGNOSIS — E2749 Other adrenocortical insufficiency: Secondary | ICD-10-CM | POA: Diagnosis not present

## 2019-08-13 DIAGNOSIS — R32 Unspecified urinary incontinence: Secondary | ICD-10-CM | POA: Diagnosis not present

## 2019-08-13 DIAGNOSIS — I509 Heart failure, unspecified: Secondary | ICD-10-CM | POA: Diagnosis not present

## 2019-08-13 DIAGNOSIS — I10 Essential (primary) hypertension: Secondary | ICD-10-CM | POA: Diagnosis not present

## 2019-08-13 DIAGNOSIS — I4891 Unspecified atrial fibrillation: Secondary | ICD-10-CM | POA: Diagnosis not present

## 2019-08-13 DIAGNOSIS — J9610 Chronic respiratory failure, unspecified whether with hypoxia or hypercapnia: Secondary | ICD-10-CM | POA: Diagnosis not present

## 2019-08-13 DIAGNOSIS — R159 Full incontinence of feces: Secondary | ICD-10-CM | POA: Diagnosis not present

## 2019-08-14 DIAGNOSIS — Z515 Encounter for palliative care: Secondary | ICD-10-CM | POA: Diagnosis not present

## 2019-08-14 DIAGNOSIS — E2749 Other adrenocortical insufficiency: Secondary | ICD-10-CM | POA: Diagnosis not present

## 2019-08-14 DIAGNOSIS — I509 Heart failure, unspecified: Secondary | ICD-10-CM | POA: Diagnosis not present

## 2019-08-14 DIAGNOSIS — I4891 Unspecified atrial fibrillation: Secondary | ICD-10-CM | POA: Diagnosis not present

## 2019-08-14 DIAGNOSIS — R32 Unspecified urinary incontinence: Secondary | ICD-10-CM | POA: Diagnosis not present

## 2019-08-14 DIAGNOSIS — J9610 Chronic respiratory failure, unspecified whether with hypoxia or hypercapnia: Secondary | ICD-10-CM | POA: Diagnosis not present

## 2019-08-14 DIAGNOSIS — I10 Essential (primary) hypertension: Secondary | ICD-10-CM | POA: Diagnosis not present

## 2019-08-14 DIAGNOSIS — R159 Full incontinence of feces: Secondary | ICD-10-CM | POA: Diagnosis not present

## 2019-08-15 DIAGNOSIS — E2749 Other adrenocortical insufficiency: Secondary | ICD-10-CM | POA: Diagnosis not present

## 2019-08-15 DIAGNOSIS — R32 Unspecified urinary incontinence: Secondary | ICD-10-CM | POA: Diagnosis not present

## 2019-08-15 DIAGNOSIS — Z515 Encounter for palliative care: Secondary | ICD-10-CM | POA: Diagnosis not present

## 2019-08-15 DIAGNOSIS — I509 Heart failure, unspecified: Secondary | ICD-10-CM | POA: Diagnosis not present

## 2019-08-15 DIAGNOSIS — I4891 Unspecified atrial fibrillation: Secondary | ICD-10-CM | POA: Diagnosis not present

## 2019-08-15 DIAGNOSIS — J9610 Chronic respiratory failure, unspecified whether with hypoxia or hypercapnia: Secondary | ICD-10-CM | POA: Diagnosis not present

## 2019-08-15 DIAGNOSIS — R159 Full incontinence of feces: Secondary | ICD-10-CM | POA: Diagnosis not present

## 2019-08-15 DIAGNOSIS — I10 Essential (primary) hypertension: Secondary | ICD-10-CM | POA: Diagnosis not present

## 2019-08-16 DIAGNOSIS — R159 Full incontinence of feces: Secondary | ICD-10-CM | POA: Diagnosis not present

## 2019-08-16 DIAGNOSIS — I4891 Unspecified atrial fibrillation: Secondary | ICD-10-CM | POA: Diagnosis not present

## 2019-08-16 DIAGNOSIS — Z515 Encounter for palliative care: Secondary | ICD-10-CM | POA: Diagnosis not present

## 2019-08-16 DIAGNOSIS — J9610 Chronic respiratory failure, unspecified whether with hypoxia or hypercapnia: Secondary | ICD-10-CM | POA: Diagnosis not present

## 2019-08-16 DIAGNOSIS — I509 Heart failure, unspecified: Secondary | ICD-10-CM | POA: Diagnosis not present

## 2019-08-16 DIAGNOSIS — I10 Essential (primary) hypertension: Secondary | ICD-10-CM | POA: Diagnosis not present

## 2019-08-16 DIAGNOSIS — E2749 Other adrenocortical insufficiency: Secondary | ICD-10-CM | POA: Diagnosis not present

## 2019-08-16 DIAGNOSIS — R32 Unspecified urinary incontinence: Secondary | ICD-10-CM | POA: Diagnosis not present

## 2019-08-17 DIAGNOSIS — I4891 Unspecified atrial fibrillation: Secondary | ICD-10-CM | POA: Diagnosis not present

## 2019-08-17 DIAGNOSIS — K5150 Left sided colitis without complications: Secondary | ICD-10-CM | POA: Diagnosis not present

## 2019-08-17 DIAGNOSIS — R159 Full incontinence of feces: Secondary | ICD-10-CM | POA: Diagnosis not present

## 2019-08-17 DIAGNOSIS — I509 Heart failure, unspecified: Secondary | ICD-10-CM | POA: Diagnosis not present

## 2019-08-17 DIAGNOSIS — Z515 Encounter for palliative care: Secondary | ICD-10-CM | POA: Diagnosis not present

## 2019-08-17 DIAGNOSIS — K625 Hemorrhage of anus and rectum: Secondary | ICD-10-CM | POA: Diagnosis not present

## 2019-08-17 DIAGNOSIS — E2749 Other adrenocortical insufficiency: Secondary | ICD-10-CM | POA: Diagnosis not present

## 2019-08-17 DIAGNOSIS — I10 Essential (primary) hypertension: Secondary | ICD-10-CM | POA: Diagnosis not present

## 2019-08-17 DIAGNOSIS — R32 Unspecified urinary incontinence: Secondary | ICD-10-CM | POA: Diagnosis not present

## 2019-08-17 DIAGNOSIS — J9610 Chronic respiratory failure, unspecified whether with hypoxia or hypercapnia: Secondary | ICD-10-CM | POA: Diagnosis not present

## 2019-08-18 DIAGNOSIS — Z515 Encounter for palliative care: Secondary | ICD-10-CM | POA: Diagnosis not present

## 2019-08-18 DIAGNOSIS — E2749 Other adrenocortical insufficiency: Secondary | ICD-10-CM | POA: Diagnosis not present

## 2019-08-18 DIAGNOSIS — I509 Heart failure, unspecified: Secondary | ICD-10-CM | POA: Diagnosis not present

## 2019-08-18 DIAGNOSIS — J9610 Chronic respiratory failure, unspecified whether with hypoxia or hypercapnia: Secondary | ICD-10-CM | POA: Diagnosis not present

## 2019-08-18 DIAGNOSIS — R32 Unspecified urinary incontinence: Secondary | ICD-10-CM | POA: Diagnosis not present

## 2019-08-18 DIAGNOSIS — I10 Essential (primary) hypertension: Secondary | ICD-10-CM | POA: Diagnosis not present

## 2019-08-18 DIAGNOSIS — R159 Full incontinence of feces: Secondary | ICD-10-CM | POA: Diagnosis not present

## 2019-08-18 DIAGNOSIS — I4891 Unspecified atrial fibrillation: Secondary | ICD-10-CM | POA: Diagnosis not present

## 2019-08-19 DIAGNOSIS — J9610 Chronic respiratory failure, unspecified whether with hypoxia or hypercapnia: Secondary | ICD-10-CM | POA: Diagnosis not present

## 2019-08-19 DIAGNOSIS — Z515 Encounter for palliative care: Secondary | ICD-10-CM | POA: Diagnosis not present

## 2019-08-19 DIAGNOSIS — I4891 Unspecified atrial fibrillation: Secondary | ICD-10-CM | POA: Diagnosis not present

## 2019-08-19 DIAGNOSIS — R32 Unspecified urinary incontinence: Secondary | ICD-10-CM | POA: Diagnosis not present

## 2019-08-19 DIAGNOSIS — I10 Essential (primary) hypertension: Secondary | ICD-10-CM | POA: Diagnosis not present

## 2019-08-19 DIAGNOSIS — E2749 Other adrenocortical insufficiency: Secondary | ICD-10-CM | POA: Diagnosis not present

## 2019-08-19 DIAGNOSIS — R159 Full incontinence of feces: Secondary | ICD-10-CM | POA: Diagnosis not present

## 2019-08-19 DIAGNOSIS — I509 Heart failure, unspecified: Secondary | ICD-10-CM | POA: Diagnosis not present

## 2019-08-20 ENCOUNTER — Encounter (HOSPITAL_COMMUNITY): Payer: BC Managed Care – PPO

## 2019-08-20 DIAGNOSIS — R32 Unspecified urinary incontinence: Secondary | ICD-10-CM | POA: Diagnosis not present

## 2019-08-20 DIAGNOSIS — I509 Heart failure, unspecified: Secondary | ICD-10-CM | POA: Diagnosis not present

## 2019-08-20 DIAGNOSIS — J9610 Chronic respiratory failure, unspecified whether with hypoxia or hypercapnia: Secondary | ICD-10-CM | POA: Diagnosis not present

## 2019-08-20 DIAGNOSIS — Z515 Encounter for palliative care: Secondary | ICD-10-CM | POA: Diagnosis not present

## 2019-08-20 DIAGNOSIS — I10 Essential (primary) hypertension: Secondary | ICD-10-CM | POA: Diagnosis not present

## 2019-08-20 DIAGNOSIS — R159 Full incontinence of feces: Secondary | ICD-10-CM | POA: Diagnosis not present

## 2019-08-20 DIAGNOSIS — E2749 Other adrenocortical insufficiency: Secondary | ICD-10-CM | POA: Diagnosis not present

## 2019-08-20 DIAGNOSIS — I4891 Unspecified atrial fibrillation: Secondary | ICD-10-CM | POA: Diagnosis not present

## 2019-08-21 DIAGNOSIS — E2749 Other adrenocortical insufficiency: Secondary | ICD-10-CM | POA: Diagnosis not present

## 2019-08-21 DIAGNOSIS — Z515 Encounter for palliative care: Secondary | ICD-10-CM | POA: Diagnosis not present

## 2019-08-21 DIAGNOSIS — J9610 Chronic respiratory failure, unspecified whether with hypoxia or hypercapnia: Secondary | ICD-10-CM | POA: Diagnosis not present

## 2019-08-21 DIAGNOSIS — I4891 Unspecified atrial fibrillation: Secondary | ICD-10-CM | POA: Diagnosis not present

## 2019-08-21 DIAGNOSIS — R32 Unspecified urinary incontinence: Secondary | ICD-10-CM | POA: Diagnosis not present

## 2019-08-21 DIAGNOSIS — I10 Essential (primary) hypertension: Secondary | ICD-10-CM | POA: Diagnosis not present

## 2019-08-21 DIAGNOSIS — R159 Full incontinence of feces: Secondary | ICD-10-CM | POA: Diagnosis not present

## 2019-08-21 DIAGNOSIS — I509 Heart failure, unspecified: Secondary | ICD-10-CM | POA: Diagnosis not present

## 2019-08-21 DIAGNOSIS — J449 Chronic obstructive pulmonary disease, unspecified: Secondary | ICD-10-CM | POA: Diagnosis not present

## 2019-08-22 DIAGNOSIS — E2749 Other adrenocortical insufficiency: Secondary | ICD-10-CM | POA: Diagnosis not present

## 2019-08-22 DIAGNOSIS — J9610 Chronic respiratory failure, unspecified whether with hypoxia or hypercapnia: Secondary | ICD-10-CM | POA: Diagnosis not present

## 2019-08-22 DIAGNOSIS — R159 Full incontinence of feces: Secondary | ICD-10-CM | POA: Diagnosis not present

## 2019-08-22 DIAGNOSIS — I509 Heart failure, unspecified: Secondary | ICD-10-CM | POA: Diagnosis not present

## 2019-08-22 DIAGNOSIS — I4891 Unspecified atrial fibrillation: Secondary | ICD-10-CM | POA: Diagnosis not present

## 2019-08-22 DIAGNOSIS — I10 Essential (primary) hypertension: Secondary | ICD-10-CM | POA: Diagnosis not present

## 2019-08-22 DIAGNOSIS — R32 Unspecified urinary incontinence: Secondary | ICD-10-CM | POA: Diagnosis not present

## 2019-08-22 DIAGNOSIS — Z515 Encounter for palliative care: Secondary | ICD-10-CM | POA: Diagnosis not present

## 2019-08-23 DIAGNOSIS — I10 Essential (primary) hypertension: Secondary | ICD-10-CM | POA: Diagnosis not present

## 2019-08-23 DIAGNOSIS — Z515 Encounter for palliative care: Secondary | ICD-10-CM | POA: Diagnosis not present

## 2019-08-23 DIAGNOSIS — R159 Full incontinence of feces: Secondary | ICD-10-CM | POA: Diagnosis not present

## 2019-08-23 DIAGNOSIS — R32 Unspecified urinary incontinence: Secondary | ICD-10-CM | POA: Diagnosis not present

## 2019-08-23 DIAGNOSIS — J9610 Chronic respiratory failure, unspecified whether with hypoxia or hypercapnia: Secondary | ICD-10-CM | POA: Diagnosis not present

## 2019-08-23 DIAGNOSIS — R5381 Other malaise: Secondary | ICD-10-CM | POA: Diagnosis not present

## 2019-08-23 DIAGNOSIS — I4891 Unspecified atrial fibrillation: Secondary | ICD-10-CM | POA: Diagnosis not present

## 2019-08-23 DIAGNOSIS — R69 Illness, unspecified: Secondary | ICD-10-CM | POA: Diagnosis not present

## 2019-08-23 DIAGNOSIS — I509 Heart failure, unspecified: Secondary | ICD-10-CM | POA: Diagnosis not present

## 2019-08-23 DIAGNOSIS — E2749 Other adrenocortical insufficiency: Secondary | ICD-10-CM | POA: Diagnosis not present

## 2019-08-24 DIAGNOSIS — R159 Full incontinence of feces: Secondary | ICD-10-CM | POA: Diagnosis not present

## 2019-08-24 DIAGNOSIS — I509 Heart failure, unspecified: Secondary | ICD-10-CM | POA: Diagnosis not present

## 2019-08-24 DIAGNOSIS — I10 Essential (primary) hypertension: Secondary | ICD-10-CM | POA: Diagnosis not present

## 2019-08-24 DIAGNOSIS — R32 Unspecified urinary incontinence: Secondary | ICD-10-CM | POA: Diagnosis not present

## 2019-08-24 DIAGNOSIS — E2749 Other adrenocortical insufficiency: Secondary | ICD-10-CM | POA: Diagnosis not present

## 2019-08-24 DIAGNOSIS — J9610 Chronic respiratory failure, unspecified whether with hypoxia or hypercapnia: Secondary | ICD-10-CM | POA: Diagnosis not present

## 2019-08-24 DIAGNOSIS — K5150 Left sided colitis without complications: Secondary | ICD-10-CM | POA: Diagnosis not present

## 2019-08-24 DIAGNOSIS — Z515 Encounter for palliative care: Secondary | ICD-10-CM | POA: Diagnosis not present

## 2019-08-24 DIAGNOSIS — I4891 Unspecified atrial fibrillation: Secondary | ICD-10-CM | POA: Diagnosis not present

## 2019-08-25 ENCOUNTER — Encounter (HOSPITAL_COMMUNITY): Payer: BC Managed Care – PPO

## 2019-08-25 DIAGNOSIS — R159 Full incontinence of feces: Secondary | ICD-10-CM | POA: Diagnosis not present

## 2019-08-25 DIAGNOSIS — I10 Essential (primary) hypertension: Secondary | ICD-10-CM | POA: Diagnosis not present

## 2019-08-25 DIAGNOSIS — R32 Unspecified urinary incontinence: Secondary | ICD-10-CM | POA: Diagnosis not present

## 2019-08-25 DIAGNOSIS — Z515 Encounter for palliative care: Secondary | ICD-10-CM | POA: Diagnosis not present

## 2019-08-25 DIAGNOSIS — D803 Selective deficiency of immunoglobulin G [IgG] subclasses: Secondary | ICD-10-CM | POA: Diagnosis not present

## 2019-08-25 DIAGNOSIS — D8989 Other specified disorders involving the immune mechanism, not elsewhere classified: Secondary | ICD-10-CM | POA: Diagnosis not present

## 2019-08-25 DIAGNOSIS — I509 Heart failure, unspecified: Secondary | ICD-10-CM | POA: Diagnosis not present

## 2019-08-25 DIAGNOSIS — E2749 Other adrenocortical insufficiency: Secondary | ICD-10-CM | POA: Diagnosis not present

## 2019-08-25 DIAGNOSIS — L8990 Pressure ulcer of unspecified site, unspecified stage: Secondary | ICD-10-CM | POA: Diagnosis not present

## 2019-08-25 DIAGNOSIS — J9610 Chronic respiratory failure, unspecified whether with hypoxia or hypercapnia: Secondary | ICD-10-CM | POA: Diagnosis not present

## 2019-08-25 DIAGNOSIS — I4891 Unspecified atrial fibrillation: Secondary | ICD-10-CM | POA: Diagnosis not present

## 2019-08-25 DIAGNOSIS — J189 Pneumonia, unspecified organism: Secondary | ICD-10-CM | POA: Diagnosis not present

## 2019-08-25 DIAGNOSIS — B9689 Other specified bacterial agents as the cause of diseases classified elsewhere: Secondary | ICD-10-CM | POA: Diagnosis not present

## 2019-08-26 DIAGNOSIS — R32 Unspecified urinary incontinence: Secondary | ICD-10-CM | POA: Diagnosis not present

## 2019-08-26 DIAGNOSIS — I4891 Unspecified atrial fibrillation: Secondary | ICD-10-CM | POA: Diagnosis not present

## 2019-08-26 DIAGNOSIS — E2749 Other adrenocortical insufficiency: Secondary | ICD-10-CM | POA: Diagnosis not present

## 2019-08-26 DIAGNOSIS — J9610 Chronic respiratory failure, unspecified whether with hypoxia or hypercapnia: Secondary | ICD-10-CM | POA: Diagnosis not present

## 2019-08-26 DIAGNOSIS — Z515 Encounter for palliative care: Secondary | ICD-10-CM | POA: Diagnosis not present

## 2019-08-26 DIAGNOSIS — I10 Essential (primary) hypertension: Secondary | ICD-10-CM | POA: Diagnosis not present

## 2019-08-26 DIAGNOSIS — I509 Heart failure, unspecified: Secondary | ICD-10-CM | POA: Diagnosis not present

## 2019-08-26 DIAGNOSIS — R159 Full incontinence of feces: Secondary | ICD-10-CM | POA: Diagnosis not present

## 2019-08-27 DIAGNOSIS — I4891 Unspecified atrial fibrillation: Secondary | ICD-10-CM | POA: Diagnosis not present

## 2019-08-27 DIAGNOSIS — E2749 Other adrenocortical insufficiency: Secondary | ICD-10-CM | POA: Diagnosis not present

## 2019-08-27 DIAGNOSIS — Z515 Encounter for palliative care: Secondary | ICD-10-CM | POA: Diagnosis not present

## 2019-08-27 DIAGNOSIS — I509 Heart failure, unspecified: Secondary | ICD-10-CM | POA: Diagnosis not present

## 2019-08-27 DIAGNOSIS — R159 Full incontinence of feces: Secondary | ICD-10-CM | POA: Diagnosis not present

## 2019-08-27 DIAGNOSIS — R32 Unspecified urinary incontinence: Secondary | ICD-10-CM | POA: Diagnosis not present

## 2019-08-27 DIAGNOSIS — J9610 Chronic respiratory failure, unspecified whether with hypoxia or hypercapnia: Secondary | ICD-10-CM | POA: Diagnosis not present

## 2019-08-27 DIAGNOSIS — I10 Essential (primary) hypertension: Secondary | ICD-10-CM | POA: Diagnosis not present

## 2019-08-28 DIAGNOSIS — I4891 Unspecified atrial fibrillation: Secondary | ICD-10-CM | POA: Diagnosis not present

## 2019-08-28 DIAGNOSIS — J9610 Chronic respiratory failure, unspecified whether with hypoxia or hypercapnia: Secondary | ICD-10-CM | POA: Diagnosis not present

## 2019-08-28 DIAGNOSIS — Z515 Encounter for palliative care: Secondary | ICD-10-CM | POA: Diagnosis not present

## 2019-08-28 DIAGNOSIS — E2749 Other adrenocortical insufficiency: Secondary | ICD-10-CM | POA: Diagnosis not present

## 2019-08-28 DIAGNOSIS — R32 Unspecified urinary incontinence: Secondary | ICD-10-CM | POA: Diagnosis not present

## 2019-08-28 DIAGNOSIS — R159 Full incontinence of feces: Secondary | ICD-10-CM | POA: Diagnosis not present

## 2019-08-28 DIAGNOSIS — I10 Essential (primary) hypertension: Secondary | ICD-10-CM | POA: Diagnosis not present

## 2019-08-28 DIAGNOSIS — I509 Heart failure, unspecified: Secondary | ICD-10-CM | POA: Diagnosis not present

## 2019-08-29 DIAGNOSIS — I509 Heart failure, unspecified: Secondary | ICD-10-CM | POA: Diagnosis not present

## 2019-08-29 DIAGNOSIS — Z515 Encounter for palliative care: Secondary | ICD-10-CM | POA: Diagnosis not present

## 2019-08-29 DIAGNOSIS — J9610 Chronic respiratory failure, unspecified whether with hypoxia or hypercapnia: Secondary | ICD-10-CM | POA: Diagnosis not present

## 2019-08-29 DIAGNOSIS — I10 Essential (primary) hypertension: Secondary | ICD-10-CM | POA: Diagnosis not present

## 2019-08-29 DIAGNOSIS — R159 Full incontinence of feces: Secondary | ICD-10-CM | POA: Diagnosis not present

## 2019-08-29 DIAGNOSIS — R32 Unspecified urinary incontinence: Secondary | ICD-10-CM | POA: Diagnosis not present

## 2019-08-29 DIAGNOSIS — I4891 Unspecified atrial fibrillation: Secondary | ICD-10-CM | POA: Diagnosis not present

## 2019-08-29 DIAGNOSIS — E2749 Other adrenocortical insufficiency: Secondary | ICD-10-CM | POA: Diagnosis not present

## 2019-08-30 DIAGNOSIS — R32 Unspecified urinary incontinence: Secondary | ICD-10-CM | POA: Diagnosis not present

## 2019-08-30 DIAGNOSIS — J9610 Chronic respiratory failure, unspecified whether with hypoxia or hypercapnia: Secondary | ICD-10-CM | POA: Diagnosis not present

## 2019-08-30 DIAGNOSIS — I4891 Unspecified atrial fibrillation: Secondary | ICD-10-CM | POA: Diagnosis not present

## 2019-08-30 DIAGNOSIS — I10 Essential (primary) hypertension: Secondary | ICD-10-CM | POA: Diagnosis not present

## 2019-08-30 DIAGNOSIS — Z515 Encounter for palliative care: Secondary | ICD-10-CM | POA: Diagnosis not present

## 2019-08-30 DIAGNOSIS — I509 Heart failure, unspecified: Secondary | ICD-10-CM | POA: Diagnosis not present

## 2019-08-30 DIAGNOSIS — R159 Full incontinence of feces: Secondary | ICD-10-CM | POA: Diagnosis not present

## 2019-08-30 DIAGNOSIS — E2749 Other adrenocortical insufficiency: Secondary | ICD-10-CM | POA: Diagnosis not present

## 2019-08-31 DIAGNOSIS — R32 Unspecified urinary incontinence: Secondary | ICD-10-CM | POA: Diagnosis not present

## 2019-08-31 DIAGNOSIS — I10 Essential (primary) hypertension: Secondary | ICD-10-CM | POA: Diagnosis not present

## 2019-08-31 DIAGNOSIS — I509 Heart failure, unspecified: Secondary | ICD-10-CM | POA: Diagnosis not present

## 2019-08-31 DIAGNOSIS — H5202 Hypermetropia, left eye: Secondary | ICD-10-CM | POA: Diagnosis not present

## 2019-08-31 DIAGNOSIS — E2749 Other adrenocortical insufficiency: Secondary | ICD-10-CM | POA: Diagnosis not present

## 2019-08-31 DIAGNOSIS — H2513 Age-related nuclear cataract, bilateral: Secondary | ICD-10-CM | POA: Diagnosis not present

## 2019-08-31 DIAGNOSIS — H401131 Primary open-angle glaucoma, bilateral, mild stage: Secondary | ICD-10-CM | POA: Diagnosis not present

## 2019-08-31 DIAGNOSIS — H25043 Posterior subcapsular polar age-related cataract, bilateral: Secondary | ICD-10-CM | POA: Diagnosis not present

## 2019-08-31 DIAGNOSIS — I4891 Unspecified atrial fibrillation: Secondary | ICD-10-CM | POA: Diagnosis not present

## 2019-08-31 DIAGNOSIS — Z515 Encounter for palliative care: Secondary | ICD-10-CM | POA: Diagnosis not present

## 2019-08-31 DIAGNOSIS — R159 Full incontinence of feces: Secondary | ICD-10-CM | POA: Diagnosis not present

## 2019-08-31 DIAGNOSIS — H5211 Myopia, right eye: Secondary | ICD-10-CM | POA: Diagnosis not present

## 2019-08-31 DIAGNOSIS — J9610 Chronic respiratory failure, unspecified whether with hypoxia or hypercapnia: Secondary | ICD-10-CM | POA: Diagnosis not present

## 2019-09-01 DIAGNOSIS — Z515 Encounter for palliative care: Secondary | ICD-10-CM | POA: Diagnosis not present

## 2019-09-01 DIAGNOSIS — R32 Unspecified urinary incontinence: Secondary | ICD-10-CM | POA: Diagnosis not present

## 2019-09-01 DIAGNOSIS — I4891 Unspecified atrial fibrillation: Secondary | ICD-10-CM | POA: Diagnosis not present

## 2019-09-01 DIAGNOSIS — E2749 Other adrenocortical insufficiency: Secondary | ICD-10-CM | POA: Diagnosis not present

## 2019-09-01 DIAGNOSIS — I10 Essential (primary) hypertension: Secondary | ICD-10-CM | POA: Diagnosis not present

## 2019-09-01 DIAGNOSIS — I509 Heart failure, unspecified: Secondary | ICD-10-CM | POA: Diagnosis not present

## 2019-09-01 DIAGNOSIS — R159 Full incontinence of feces: Secondary | ICD-10-CM | POA: Diagnosis not present

## 2019-09-01 DIAGNOSIS — J9610 Chronic respiratory failure, unspecified whether with hypoxia or hypercapnia: Secondary | ICD-10-CM | POA: Diagnosis not present

## 2019-09-02 ENCOUNTER — Telehealth: Payer: BC Managed Care – PPO | Admitting: Cardiology

## 2019-09-02 DIAGNOSIS — R159 Full incontinence of feces: Secondary | ICD-10-CM | POA: Diagnosis not present

## 2019-09-02 DIAGNOSIS — J9610 Chronic respiratory failure, unspecified whether with hypoxia or hypercapnia: Secondary | ICD-10-CM | POA: Diagnosis not present

## 2019-09-02 DIAGNOSIS — I509 Heart failure, unspecified: Secondary | ICD-10-CM | POA: Diagnosis not present

## 2019-09-02 DIAGNOSIS — Z515 Encounter for palliative care: Secondary | ICD-10-CM | POA: Diagnosis not present

## 2019-09-02 DIAGNOSIS — E2749 Other adrenocortical insufficiency: Secondary | ICD-10-CM | POA: Diagnosis not present

## 2019-09-02 DIAGNOSIS — K5150 Left sided colitis without complications: Secondary | ICD-10-CM | POA: Diagnosis not present

## 2019-09-02 DIAGNOSIS — R32 Unspecified urinary incontinence: Secondary | ICD-10-CM | POA: Diagnosis not present

## 2019-09-02 DIAGNOSIS — I10 Essential (primary) hypertension: Secondary | ICD-10-CM | POA: Diagnosis not present

## 2019-09-02 DIAGNOSIS — I4891 Unspecified atrial fibrillation: Secondary | ICD-10-CM | POA: Diagnosis not present

## 2019-09-03 DIAGNOSIS — I10 Essential (primary) hypertension: Secondary | ICD-10-CM | POA: Diagnosis not present

## 2019-09-03 DIAGNOSIS — J9610 Chronic respiratory failure, unspecified whether with hypoxia or hypercapnia: Secondary | ICD-10-CM | POA: Diagnosis not present

## 2019-09-03 DIAGNOSIS — I4891 Unspecified atrial fibrillation: Secondary | ICD-10-CM | POA: Diagnosis not present

## 2019-09-03 DIAGNOSIS — Z515 Encounter for palliative care: Secondary | ICD-10-CM | POA: Diagnosis not present

## 2019-09-03 DIAGNOSIS — R159 Full incontinence of feces: Secondary | ICD-10-CM | POA: Diagnosis not present

## 2019-09-03 DIAGNOSIS — I509 Heart failure, unspecified: Secondary | ICD-10-CM | POA: Diagnosis not present

## 2019-09-03 DIAGNOSIS — E2749 Other adrenocortical insufficiency: Secondary | ICD-10-CM | POA: Diagnosis not present

## 2019-09-03 DIAGNOSIS — R32 Unspecified urinary incontinence: Secondary | ICD-10-CM | POA: Diagnosis not present

## 2019-09-04 DIAGNOSIS — I10 Essential (primary) hypertension: Secondary | ICD-10-CM | POA: Diagnosis not present

## 2019-09-04 DIAGNOSIS — E2749 Other adrenocortical insufficiency: Secondary | ICD-10-CM | POA: Diagnosis not present

## 2019-09-04 DIAGNOSIS — R32 Unspecified urinary incontinence: Secondary | ICD-10-CM | POA: Diagnosis not present

## 2019-09-04 DIAGNOSIS — I4891 Unspecified atrial fibrillation: Secondary | ICD-10-CM | POA: Diagnosis not present

## 2019-09-04 DIAGNOSIS — R159 Full incontinence of feces: Secondary | ICD-10-CM | POA: Diagnosis not present

## 2019-09-04 DIAGNOSIS — J9610 Chronic respiratory failure, unspecified whether with hypoxia or hypercapnia: Secondary | ICD-10-CM | POA: Diagnosis not present

## 2019-09-04 DIAGNOSIS — I509 Heart failure, unspecified: Secondary | ICD-10-CM | POA: Diagnosis not present

## 2019-09-04 DIAGNOSIS — Z515 Encounter for palliative care: Secondary | ICD-10-CM | POA: Diagnosis not present

## 2019-09-05 DIAGNOSIS — J9610 Chronic respiratory failure, unspecified whether with hypoxia or hypercapnia: Secondary | ICD-10-CM | POA: Diagnosis not present

## 2019-09-05 DIAGNOSIS — I4891 Unspecified atrial fibrillation: Secondary | ICD-10-CM | POA: Diagnosis not present

## 2019-09-05 DIAGNOSIS — E2749 Other adrenocortical insufficiency: Secondary | ICD-10-CM | POA: Diagnosis not present

## 2019-09-05 DIAGNOSIS — R32 Unspecified urinary incontinence: Secondary | ICD-10-CM | POA: Diagnosis not present

## 2019-09-05 DIAGNOSIS — I10 Essential (primary) hypertension: Secondary | ICD-10-CM | POA: Diagnosis not present

## 2019-09-05 DIAGNOSIS — Z515 Encounter for palliative care: Secondary | ICD-10-CM | POA: Diagnosis not present

## 2019-09-05 DIAGNOSIS — I509 Heart failure, unspecified: Secondary | ICD-10-CM | POA: Diagnosis not present

## 2019-09-05 DIAGNOSIS — R159 Full incontinence of feces: Secondary | ICD-10-CM | POA: Diagnosis not present

## 2019-09-06 DIAGNOSIS — R32 Unspecified urinary incontinence: Secondary | ICD-10-CM | POA: Diagnosis not present

## 2019-09-06 DIAGNOSIS — I10 Essential (primary) hypertension: Secondary | ICD-10-CM | POA: Diagnosis not present

## 2019-09-06 DIAGNOSIS — E2749 Other adrenocortical insufficiency: Secondary | ICD-10-CM | POA: Diagnosis not present

## 2019-09-06 DIAGNOSIS — I4891 Unspecified atrial fibrillation: Secondary | ICD-10-CM | POA: Diagnosis not present

## 2019-09-06 DIAGNOSIS — R159 Full incontinence of feces: Secondary | ICD-10-CM | POA: Diagnosis not present

## 2019-09-06 DIAGNOSIS — Z515 Encounter for palliative care: Secondary | ICD-10-CM | POA: Diagnosis not present

## 2019-09-06 DIAGNOSIS — I509 Heart failure, unspecified: Secondary | ICD-10-CM | POA: Diagnosis not present

## 2019-09-06 DIAGNOSIS — J9610 Chronic respiratory failure, unspecified whether with hypoxia or hypercapnia: Secondary | ICD-10-CM | POA: Diagnosis not present

## 2019-09-07 DIAGNOSIS — I509 Heart failure, unspecified: Secondary | ICD-10-CM | POA: Diagnosis not present

## 2019-09-07 DIAGNOSIS — Z515 Encounter for palliative care: Secondary | ICD-10-CM | POA: Diagnosis not present

## 2019-09-07 DIAGNOSIS — R159 Full incontinence of feces: Secondary | ICD-10-CM | POA: Diagnosis not present

## 2019-09-07 DIAGNOSIS — I4891 Unspecified atrial fibrillation: Secondary | ICD-10-CM | POA: Diagnosis not present

## 2019-09-07 DIAGNOSIS — J9610 Chronic respiratory failure, unspecified whether with hypoxia or hypercapnia: Secondary | ICD-10-CM | POA: Diagnosis not present

## 2019-09-07 DIAGNOSIS — E2749 Other adrenocortical insufficiency: Secondary | ICD-10-CM | POA: Diagnosis not present

## 2019-09-07 DIAGNOSIS — R32 Unspecified urinary incontinence: Secondary | ICD-10-CM | POA: Diagnosis not present

## 2019-09-07 DIAGNOSIS — I10 Essential (primary) hypertension: Secondary | ICD-10-CM | POA: Diagnosis not present

## 2019-09-08 DIAGNOSIS — R159 Full incontinence of feces: Secondary | ICD-10-CM | POA: Diagnosis not present

## 2019-09-08 DIAGNOSIS — Z515 Encounter for palliative care: Secondary | ICD-10-CM | POA: Diagnosis not present

## 2019-09-08 DIAGNOSIS — J9610 Chronic respiratory failure, unspecified whether with hypoxia or hypercapnia: Secondary | ICD-10-CM | POA: Diagnosis not present

## 2019-09-08 DIAGNOSIS — I509 Heart failure, unspecified: Secondary | ICD-10-CM | POA: Diagnosis not present

## 2019-09-08 DIAGNOSIS — R32 Unspecified urinary incontinence: Secondary | ICD-10-CM | POA: Diagnosis not present

## 2019-09-08 DIAGNOSIS — I10 Essential (primary) hypertension: Secondary | ICD-10-CM | POA: Diagnosis not present

## 2019-09-08 DIAGNOSIS — I4891 Unspecified atrial fibrillation: Secondary | ICD-10-CM | POA: Diagnosis not present

## 2019-09-08 DIAGNOSIS — E2749 Other adrenocortical insufficiency: Secondary | ICD-10-CM | POA: Diagnosis not present

## 2019-09-09 ENCOUNTER — Inpatient Hospital Stay (HOSPITAL_COMMUNITY): Admission: RE | Admit: 2019-09-09 | Payer: BC Managed Care – PPO | Source: Ambulatory Visit

## 2019-09-09 ENCOUNTER — Telehealth (INDEPENDENT_AMBULATORY_CARE_PROVIDER_SITE_OTHER): Payer: BC Managed Care – PPO | Admitting: Cardiology

## 2019-09-09 VITALS — BP 137/87 | HR 109 | Ht 60.0 in | Wt 126.0 lb

## 2019-09-09 DIAGNOSIS — I4891 Unspecified atrial fibrillation: Secondary | ICD-10-CM | POA: Diagnosis not present

## 2019-09-09 DIAGNOSIS — R0602 Shortness of breath: Secondary | ICD-10-CM

## 2019-09-09 DIAGNOSIS — Z515 Encounter for palliative care: Secondary | ICD-10-CM | POA: Diagnosis not present

## 2019-09-09 DIAGNOSIS — I480 Paroxysmal atrial fibrillation: Secondary | ICD-10-CM | POA: Diagnosis not present

## 2019-09-09 DIAGNOSIS — R000 Tachycardia, unspecified: Secondary | ICD-10-CM

## 2019-09-09 DIAGNOSIS — J9610 Chronic respiratory failure, unspecified whether with hypoxia or hypercapnia: Secondary | ICD-10-CM | POA: Diagnosis not present

## 2019-09-09 DIAGNOSIS — R32 Unspecified urinary incontinence: Secondary | ICD-10-CM | POA: Diagnosis not present

## 2019-09-09 DIAGNOSIS — I471 Supraventricular tachycardia: Secondary | ICD-10-CM

## 2019-09-09 DIAGNOSIS — I10 Essential (primary) hypertension: Secondary | ICD-10-CM | POA: Diagnosis not present

## 2019-09-09 DIAGNOSIS — I509 Heart failure, unspecified: Secondary | ICD-10-CM | POA: Diagnosis not present

## 2019-09-09 DIAGNOSIS — E2749 Other adrenocortical insufficiency: Secondary | ICD-10-CM | POA: Diagnosis not present

## 2019-09-09 DIAGNOSIS — R159 Full incontinence of feces: Secondary | ICD-10-CM | POA: Diagnosis not present

## 2019-09-09 DIAGNOSIS — Z712 Person consulting for explanation of examination or test findings: Secondary | ICD-10-CM

## 2019-09-09 NOTE — Patient Instructions (Signed)
Medication Instructions:  Your Physician recommend you continue on your current medication as directed.    *If you need a refill on your cardiac medications before your next appointment, please call your pharmacy*  Lab Work: None  Testing/Procedures: None  Follow-Up: At [facility], you and your health needs are our priority.  As part of our continuing mission to provide you with exceptional heart care, we have created designated Provider Care Teams.  These Care Teams include your primary Cardiologist (physician) and Advanced Practice Providers (APPs -  Physician Assistants and Nurse Practitioners) who all work together to provide you with the care you need, when you need it.  Your next appointment:   6 month(s)  The format for your next appointment:   Either In Person or Virtual  Provider:   Bridgette Christopher, MD    

## 2019-09-09 NOTE — Progress Notes (Signed)
Virtual Visit via Telephone Note   This visit type was conducted due to national recommendations for restrictions regarding the COVID-19 Pandemic (e.g. social distancing) in an effort to limit this patient's exposure and mitigate transmission in our community.  Due to her co-morbid illnesses, this patient is at least at moderate risk for complications without adequate follow up.  This format is felt to be most appropriate for this patient at this time.  The patient did not have access to video technology/had technical difficulties with video requiring transitioning to audio format only (telephone).  All issues noted in this document were discussed and addressed.  No physical exam could be performed with this format.  Please refer to the patient's chart for her  consent to telehealth for [facility].   Date:  09/09/2019   ID:  JAZELL ROSENAU, DOB 1956-08-20, MRN [id number]  Patient Location: Home Provider Location: Home  PCP:  Crist Infante, MD  Cardiologist:  Buford Dresser, MD  Electrophysiologist:  None   Evaluation Performed:  Follow-Up Visit  Chief Complaint:  Follow up monitor results  History of Present Illness:    Betty Bryan is a 63 y.o. female with a hx of paroxysmal atrial fib, adrenal insufficiency, hypertension, history of DVT, steroid-dependent IBD. She was recently in hospital for sepsis/cellulitis/pneumonia. Per notes, she was discharged with home hospice after that admission. She has had multiple admissions in the last six months for sepsis/pneumonia.  More short of breath today. Has a home health nurse, says lungs are clear. She wears chronic oxygen by nasal cannula. She did have pleural effusions on her recent hospital admission. No fever, mild congestion, not bringing anything up with her cough. Notes her heart rate has been around 104, baseline on recent monitor was 98 bpm. Her main concern today is pain; she is now in home hospice and having issues with  controlling her long term spinal and rib pains. Her home health nurse is coming this afternoon, and I recommended she bring up her pain concerns at that time.  We reviewed her monitor results together. Her symptoms actually do not correlate with arrhythmia, though she does have brief episodes of paroxysmal SVT that are not correlated with symptoms. She is already on atenolol. She was wearing the Zio patch when I first met her in the hospital in August to determine whether she was having atrial fibrillation. No clear episodes of afib seen--intermittent pSVT is very brief and not definitively afib.   Past Medical History:  Diagnosis Date   Adrenal insufficiency (HCC)    HTN (hypertension)    L4 vertebral fracture (HCC)    L4-L5 fracture   Palpitations    Paroxysmal A-fib (HCC)    PVC's (premature ventricular contractions)    Ulcerative colitis    Past Surgical History:  Procedure Laterality Date   HAND SURGERY     NASAL SINUS SURGERY       Current Meds  Medication Sig   atenolol (TENORMIN) 50 MG tablet Take 25 mg by mouth 3 (three) times daily.   balsalazide (COLAZAL) 750 MG capsule Take 2,250 mg by mouth 3 (three) times daily.   calcium-vitamin D (OSCAL WITH D) 500-200 MG-UNIT tablet Take 1 tablet by mouth daily.   diazepam (VALIUM) 5 MG tablet Take 1 tablet by mouth daily as needed. Take 0.5 tab PO QD   ELIQUIS 2.5 MG TABS tablet Take 2.5 mg by mouth 2 (two) times daily.    fluticasone (FLONASE) 50 MCG/ACT nasal spray  Place 1 spray into both nostrils 2 (two) times daily.    furosemide (LASIX) 20 MG tablet Take 1 tablet (20 mg total) by mouth daily. (Patient taking differently: Take 20 mg by mouth every other day. )   gabapentin (NEURONTIN) 100 MG capsule Take 100-300 mg by mouth 3 (three) times daily as needed (for nerve pain).    HYDROCORTISONE ACE, RECTAL, 30 MG SUPP Place 1 suppository rectally at bedtime.    loratadine (CLARITIN) 10 MG tablet Take 10 mg by  mouth daily.   nystatin (MYCOSTATIN/NYSTOP) powder Apply 1 application topically 3 (three) times daily.    oxyCODONE-acetaminophen (PERCOCET/ROXICET) 5-325 MG tablet Take 1-2 tablets by mouth every 6 (six) hours as needed for moderate pain or severe pain.   pantoprazole (PROTONIX) 20 MG tablet Take 20 mg by mouth daily.   potassium chloride (KLOR-CON) 10 MEQ tablet Take 10 mEq by mouth daily.   predniSONE (DELTASONE) 5 MG tablet Take 7.5-8 mg by mouth daily. As directed by your physician     Allergies:   Penicillins, Sulfa antibiotics, Morphine and related, and Voltaren [diclofenac]   Social History   Tobacco Use   Smoking status: Never Smoker   Smokeless tobacco: Never Used  Substance Use Topics   Alcohol use: No    Alcohol/week: 0.0 standard drinks   Drug use: No     Family Hx: The patient's family history includes Diabetes in her father; Heart disease in her father; Heart failure in an other family member; Hypertension in her father.  ROS:   Please see the history of present illness.    All other systems reviewed and are negative.   Prior CV studies:   The following studies were reviewed today: Monitor 08/15/19 14 days of data recorded on Zio monitor. Patient had a min HR of 70 bpm, max HR of 245 bpm, and avg HR of 98 bpm. Predominant underlying rhythm was Sinus Rhythm. No atrial fibrillation, high degree block, or pauses noted. Isolated atrial and ventricular ectopy was rare (<1%). There were 2 triggered events, both of which were sinus rhythm. There were 4 beats, labeled as NSVT but appear to be SR with ectopy. 5 Supraventricular Tachycardia runs occurred, the run with the fastest interval lasting 15 beats with a max rate of 245 bpm, the longest lasting 10 beats with an avg rate of 144 bpm.t. Ventricular Bigeminy and Trigeminy were rare and lasted only seconds.  Labs/Other Tests and Data Reviewed:    EKG:  An ECG dated 07/01/19 was personally reviewed today and  demonstrated:  sinus tach, nonspecific st changes/depressions  Recent Labs: 07/01/2019: B Natriuretic Peptide 134.0 07/02/2019: ALT 18 07/04/2019: Magnesium 2.1 07/05/2019: BUN 7; Creatinine, Ser 0.35; Hemoglobin 10.2; Platelets 194; Potassium 3.0; Sodium 142   Recent Lipid Panel No results found for: CHOL, TRIG, HDL, CHOLHDL, LDLCALC, LDLDIRECT  Wt Readings from Last 3 Encounters:  09/09/19 126 lb (57.2 kg)  07/05/19 146 lb 6.2 oz (66.4 kg)  06/12/19 153 lb (69.4 kg)     Objective:    Vital Signs:  BP 137/87    Pulse (!) 109    Ht 5' (1.524 m)    Wt 126 lb (57.2 kg)    SpO2 95%    BMI 24.61 kg/m    Speaking comfortably on the phone, no audible wheezing In no acute distress Alert and oriented Normal affect Normal speech  ASSESSMENT & PLAN:    History of reported paroxysmal atrial tachycardia, with recent monitor showing brief paroxysmal SVT  not correlated to her symptoms: -we reviewed her monitor results at length today -she is already on a beta blocker, which seems to be controlling events well -she is on DVT dose apixaban. Monitor had initially been ordered by her PCP to evaluate for afib. No definitive afib seen on monitor--only very brief events of SVT -I do not think she is a good candidate for ablation given her low burden and lack of correlation with symptoms. Would continue medical management.  She is in home hospice and has many concerns re: her breathing and pain control. She was speaking comfortably on the phone, but due to technical limitations we could not communicate via video. She does have her home care nurse coming this afternoon.  I offered to follow up PRN, but she would like to continue to check on her heart. Therefore, we will touch base in 6 mos (virtual or in person ok) or sooner if needed.  COVID-19 Education: The signs and symptoms of COVID-19 were discussed with the patient and how to seek care for testing (follow up with PCP or arrange E-visit).  The  importance of social distancing was discussed today.  Time:   Today, I have spent 15 minutes with the patient with telehealth technology discussing the above problems.    Patient Instructions  Medication Instructions:  Your Physician recommend you continue on your current medication as directed.    *If you need a refill on your cardiac medications before your next appointment, please call your pharmacy*  Lab Work: None  Testing/Procedures: None  Follow-Up: At [facility], you and your health needs are our priority.  As part of our continuing mission to provide you with exceptional heart care, we have created designated Provider Care Teams.  These Care Teams include your primary Cardiologist (physician) and Advanced Practice Providers (APPs -  Physician Assistants and Nurse Practitioners) who all work together to provide you with the care you need, when you need it.  Your next appointment:   6 months   The format for your next appointment:   Either In Person or Virtual  Provider:   Buford Dresser, MD      Signed, Buford Dresser, MD  09/09/2019 11:57 AM    [place]

## 2019-09-10 DIAGNOSIS — I10 Essential (primary) hypertension: Secondary | ICD-10-CM | POA: Diagnosis not present

## 2019-09-10 DIAGNOSIS — R159 Full incontinence of feces: Secondary | ICD-10-CM | POA: Diagnosis not present

## 2019-09-10 DIAGNOSIS — J9610 Chronic respiratory failure, unspecified whether with hypoxia or hypercapnia: Secondary | ICD-10-CM | POA: Diagnosis not present

## 2019-09-10 DIAGNOSIS — I509 Heart failure, unspecified: Secondary | ICD-10-CM | POA: Diagnosis not present

## 2019-09-10 DIAGNOSIS — I4891 Unspecified atrial fibrillation: Secondary | ICD-10-CM | POA: Diagnosis not present

## 2019-09-10 DIAGNOSIS — R32 Unspecified urinary incontinence: Secondary | ICD-10-CM | POA: Diagnosis not present

## 2019-09-10 DIAGNOSIS — E2749 Other adrenocortical insufficiency: Secondary | ICD-10-CM | POA: Diagnosis not present

## 2019-09-10 DIAGNOSIS — Z515 Encounter for palliative care: Secondary | ICD-10-CM | POA: Diagnosis not present

## 2019-09-11 DIAGNOSIS — E2749 Other adrenocortical insufficiency: Secondary | ICD-10-CM | POA: Diagnosis not present

## 2019-09-11 DIAGNOSIS — Z515 Encounter for palliative care: Secondary | ICD-10-CM | POA: Diagnosis not present

## 2019-09-11 DIAGNOSIS — J9610 Chronic respiratory failure, unspecified whether with hypoxia or hypercapnia: Secondary | ICD-10-CM | POA: Diagnosis not present

## 2019-09-11 DIAGNOSIS — I509 Heart failure, unspecified: Secondary | ICD-10-CM | POA: Diagnosis not present

## 2019-09-11 DIAGNOSIS — R32 Unspecified urinary incontinence: Secondary | ICD-10-CM | POA: Diagnosis not present

## 2019-09-11 DIAGNOSIS — R159 Full incontinence of feces: Secondary | ICD-10-CM | POA: Diagnosis not present

## 2019-09-11 DIAGNOSIS — I4891 Unspecified atrial fibrillation: Secondary | ICD-10-CM | POA: Diagnosis not present

## 2019-09-11 DIAGNOSIS — I10 Essential (primary) hypertension: Secondary | ICD-10-CM | POA: Diagnosis not present

## 2019-09-12 DIAGNOSIS — I509 Heart failure, unspecified: Secondary | ICD-10-CM | POA: Diagnosis not present

## 2019-09-12 DIAGNOSIS — E2749 Other adrenocortical insufficiency: Secondary | ICD-10-CM | POA: Diagnosis not present

## 2019-09-12 DIAGNOSIS — J9610 Chronic respiratory failure, unspecified whether with hypoxia or hypercapnia: Secondary | ICD-10-CM | POA: Diagnosis not present

## 2019-09-12 DIAGNOSIS — R159 Full incontinence of feces: Secondary | ICD-10-CM | POA: Diagnosis not present

## 2019-09-12 DIAGNOSIS — I10 Essential (primary) hypertension: Secondary | ICD-10-CM | POA: Diagnosis not present

## 2019-09-12 DIAGNOSIS — Z515 Encounter for palliative care: Secondary | ICD-10-CM | POA: Diagnosis not present

## 2019-09-12 DIAGNOSIS — R32 Unspecified urinary incontinence: Secondary | ICD-10-CM | POA: Diagnosis not present

## 2019-09-12 DIAGNOSIS — I4891 Unspecified atrial fibrillation: Secondary | ICD-10-CM | POA: Diagnosis not present

## 2019-09-13 DIAGNOSIS — Z515 Encounter for palliative care: Secondary | ICD-10-CM | POA: Diagnosis not present

## 2019-09-13 DIAGNOSIS — I509 Heart failure, unspecified: Secondary | ICD-10-CM | POA: Diagnosis not present

## 2019-09-13 DIAGNOSIS — J9610 Chronic respiratory failure, unspecified whether with hypoxia or hypercapnia: Secondary | ICD-10-CM | POA: Diagnosis not present

## 2019-09-13 DIAGNOSIS — R32 Unspecified urinary incontinence: Secondary | ICD-10-CM | POA: Diagnosis not present

## 2019-09-13 DIAGNOSIS — R159 Full incontinence of feces: Secondary | ICD-10-CM | POA: Diagnosis not present

## 2019-09-13 DIAGNOSIS — I4891 Unspecified atrial fibrillation: Secondary | ICD-10-CM | POA: Diagnosis not present

## 2019-09-13 DIAGNOSIS — E2749 Other adrenocortical insufficiency: Secondary | ICD-10-CM | POA: Diagnosis not present

## 2019-09-13 DIAGNOSIS — I10 Essential (primary) hypertension: Secondary | ICD-10-CM | POA: Diagnosis not present

## 2019-09-14 DIAGNOSIS — I10 Essential (primary) hypertension: Secondary | ICD-10-CM | POA: Diagnosis not present

## 2019-09-14 DIAGNOSIS — J9610 Chronic respiratory failure, unspecified whether with hypoxia or hypercapnia: Secondary | ICD-10-CM | POA: Diagnosis not present

## 2019-09-14 DIAGNOSIS — E2749 Other adrenocortical insufficiency: Secondary | ICD-10-CM | POA: Diagnosis not present

## 2019-09-14 DIAGNOSIS — Z515 Encounter for palliative care: Secondary | ICD-10-CM | POA: Diagnosis not present

## 2019-09-14 DIAGNOSIS — I509 Heart failure, unspecified: Secondary | ICD-10-CM | POA: Diagnosis not present

## 2019-09-14 DIAGNOSIS — R159 Full incontinence of feces: Secondary | ICD-10-CM | POA: Diagnosis not present

## 2019-09-14 DIAGNOSIS — R32 Unspecified urinary incontinence: Secondary | ICD-10-CM | POA: Diagnosis not present

## 2019-09-14 DIAGNOSIS — I4891 Unspecified atrial fibrillation: Secondary | ICD-10-CM | POA: Diagnosis not present

## 2019-09-15 DIAGNOSIS — H2512 Age-related nuclear cataract, left eye: Secondary | ICD-10-CM | POA: Diagnosis not present

## 2019-09-15 DIAGNOSIS — Z515 Encounter for palliative care: Secondary | ICD-10-CM | POA: Diagnosis not present

## 2019-09-15 DIAGNOSIS — R159 Full incontinence of feces: Secondary | ICD-10-CM | POA: Diagnosis not present

## 2019-09-15 DIAGNOSIS — I509 Heart failure, unspecified: Secondary | ICD-10-CM | POA: Diagnosis not present

## 2019-09-15 DIAGNOSIS — H25041 Posterior subcapsular polar age-related cataract, right eye: Secondary | ICD-10-CM | POA: Diagnosis not present

## 2019-09-15 DIAGNOSIS — R32 Unspecified urinary incontinence: Secondary | ICD-10-CM | POA: Diagnosis not present

## 2019-09-15 DIAGNOSIS — E2749 Other adrenocortical insufficiency: Secondary | ICD-10-CM | POA: Diagnosis not present

## 2019-09-15 DIAGNOSIS — H2511 Age-related nuclear cataract, right eye: Secondary | ICD-10-CM | POA: Diagnosis not present

## 2019-09-15 DIAGNOSIS — J9610 Chronic respiratory failure, unspecified whether with hypoxia or hypercapnia: Secondary | ICD-10-CM | POA: Diagnosis not present

## 2019-09-15 DIAGNOSIS — H25042 Posterior subcapsular polar age-related cataract, left eye: Secondary | ICD-10-CM | POA: Diagnosis not present

## 2019-09-15 DIAGNOSIS — I10 Essential (primary) hypertension: Secondary | ICD-10-CM | POA: Diagnosis not present

## 2019-09-15 DIAGNOSIS — I4891 Unspecified atrial fibrillation: Secondary | ICD-10-CM | POA: Diagnosis not present

## 2019-09-16 DIAGNOSIS — I4891 Unspecified atrial fibrillation: Secondary | ICD-10-CM | POA: Diagnosis not present

## 2019-09-16 DIAGNOSIS — E2749 Other adrenocortical insufficiency: Secondary | ICD-10-CM | POA: Diagnosis not present

## 2019-09-16 DIAGNOSIS — J9610 Chronic respiratory failure, unspecified whether with hypoxia or hypercapnia: Secondary | ICD-10-CM | POA: Diagnosis not present

## 2019-09-16 DIAGNOSIS — Z515 Encounter for palliative care: Secondary | ICD-10-CM | POA: Diagnosis not present

## 2019-09-16 DIAGNOSIS — R159 Full incontinence of feces: Secondary | ICD-10-CM | POA: Diagnosis not present

## 2019-09-16 DIAGNOSIS — R32 Unspecified urinary incontinence: Secondary | ICD-10-CM | POA: Diagnosis not present

## 2019-09-16 DIAGNOSIS — I10 Essential (primary) hypertension: Secondary | ICD-10-CM | POA: Diagnosis not present

## 2019-09-16 DIAGNOSIS — I509 Heart failure, unspecified: Secondary | ICD-10-CM | POA: Diagnosis not present

## 2019-09-17 DIAGNOSIS — R32 Unspecified urinary incontinence: Secondary | ICD-10-CM | POA: Diagnosis not present

## 2019-09-17 DIAGNOSIS — E2749 Other adrenocortical insufficiency: Secondary | ICD-10-CM | POA: Diagnosis not present

## 2019-09-17 DIAGNOSIS — Z515 Encounter for palliative care: Secondary | ICD-10-CM | POA: Diagnosis not present

## 2019-09-17 DIAGNOSIS — I4891 Unspecified atrial fibrillation: Secondary | ICD-10-CM | POA: Diagnosis not present

## 2019-09-17 DIAGNOSIS — J9610 Chronic respiratory failure, unspecified whether with hypoxia or hypercapnia: Secondary | ICD-10-CM | POA: Diagnosis not present

## 2019-09-17 DIAGNOSIS — R159 Full incontinence of feces: Secondary | ICD-10-CM | POA: Diagnosis not present

## 2019-09-17 DIAGNOSIS — I509 Heart failure, unspecified: Secondary | ICD-10-CM | POA: Diagnosis not present

## 2019-09-17 DIAGNOSIS — I10 Essential (primary) hypertension: Secondary | ICD-10-CM | POA: Diagnosis not present

## 2019-09-18 DIAGNOSIS — Z515 Encounter for palliative care: Secondary | ICD-10-CM | POA: Diagnosis not present

## 2019-09-18 DIAGNOSIS — R32 Unspecified urinary incontinence: Secondary | ICD-10-CM | POA: Diagnosis not present

## 2019-09-18 DIAGNOSIS — I4891 Unspecified atrial fibrillation: Secondary | ICD-10-CM | POA: Diagnosis not present

## 2019-09-18 DIAGNOSIS — I10 Essential (primary) hypertension: Secondary | ICD-10-CM | POA: Diagnosis not present

## 2019-09-18 DIAGNOSIS — R159 Full incontinence of feces: Secondary | ICD-10-CM | POA: Diagnosis not present

## 2019-09-18 DIAGNOSIS — I509 Heart failure, unspecified: Secondary | ICD-10-CM | POA: Diagnosis not present

## 2019-09-18 DIAGNOSIS — E2749 Other adrenocortical insufficiency: Secondary | ICD-10-CM | POA: Diagnosis not present

## 2019-09-18 DIAGNOSIS — J9610 Chronic respiratory failure, unspecified whether with hypoxia or hypercapnia: Secondary | ICD-10-CM | POA: Diagnosis not present

## 2019-09-19 DIAGNOSIS — Z515 Encounter for palliative care: Secondary | ICD-10-CM | POA: Diagnosis not present

## 2019-09-19 DIAGNOSIS — I10 Essential (primary) hypertension: Secondary | ICD-10-CM | POA: Diagnosis not present

## 2019-09-19 DIAGNOSIS — R159 Full incontinence of feces: Secondary | ICD-10-CM | POA: Diagnosis not present

## 2019-09-19 DIAGNOSIS — I4891 Unspecified atrial fibrillation: Secondary | ICD-10-CM | POA: Diagnosis not present

## 2019-09-19 DIAGNOSIS — E2749 Other adrenocortical insufficiency: Secondary | ICD-10-CM | POA: Diagnosis not present

## 2019-09-19 DIAGNOSIS — J9610 Chronic respiratory failure, unspecified whether with hypoxia or hypercapnia: Secondary | ICD-10-CM | POA: Diagnosis not present

## 2019-09-19 DIAGNOSIS — I509 Heart failure, unspecified: Secondary | ICD-10-CM | POA: Diagnosis not present

## 2019-09-19 DIAGNOSIS — R32 Unspecified urinary incontinence: Secondary | ICD-10-CM | POA: Diagnosis not present

## 2019-09-20 DIAGNOSIS — I509 Heart failure, unspecified: Secondary | ICD-10-CM | POA: Diagnosis not present

## 2019-09-20 DIAGNOSIS — Z515 Encounter for palliative care: Secondary | ICD-10-CM | POA: Diagnosis not present

## 2019-09-20 DIAGNOSIS — E2749 Other adrenocortical insufficiency: Secondary | ICD-10-CM | POA: Diagnosis not present

## 2019-09-20 DIAGNOSIS — I4891 Unspecified atrial fibrillation: Secondary | ICD-10-CM | POA: Diagnosis not present

## 2019-09-20 DIAGNOSIS — J9610 Chronic respiratory failure, unspecified whether with hypoxia or hypercapnia: Secondary | ICD-10-CM | POA: Diagnosis not present

## 2019-09-20 DIAGNOSIS — J449 Chronic obstructive pulmonary disease, unspecified: Secondary | ICD-10-CM | POA: Diagnosis not present

## 2019-09-20 DIAGNOSIS — R32 Unspecified urinary incontinence: Secondary | ICD-10-CM | POA: Diagnosis not present

## 2019-09-20 DIAGNOSIS — R159 Full incontinence of feces: Secondary | ICD-10-CM | POA: Diagnosis not present

## 2019-09-20 DIAGNOSIS — I10 Essential (primary) hypertension: Secondary | ICD-10-CM | POA: Diagnosis not present

## 2019-09-21 DIAGNOSIS — I4891 Unspecified atrial fibrillation: Secondary | ICD-10-CM | POA: Diagnosis not present

## 2019-09-21 DIAGNOSIS — Z515 Encounter for palliative care: Secondary | ICD-10-CM | POA: Diagnosis not present

## 2019-09-21 DIAGNOSIS — E2749 Other adrenocortical insufficiency: Secondary | ICD-10-CM | POA: Diagnosis not present

## 2019-09-21 DIAGNOSIS — J9610 Chronic respiratory failure, unspecified whether with hypoxia or hypercapnia: Secondary | ICD-10-CM | POA: Diagnosis not present

## 2019-09-21 DIAGNOSIS — R32 Unspecified urinary incontinence: Secondary | ICD-10-CM | POA: Diagnosis not present

## 2019-09-21 DIAGNOSIS — I10 Essential (primary) hypertension: Secondary | ICD-10-CM | POA: Diagnosis not present

## 2019-09-21 DIAGNOSIS — I509 Heart failure, unspecified: Secondary | ICD-10-CM | POA: Diagnosis not present

## 2019-09-21 DIAGNOSIS — R159 Full incontinence of feces: Secondary | ICD-10-CM | POA: Diagnosis not present

## 2019-09-22 DIAGNOSIS — R32 Unspecified urinary incontinence: Secondary | ICD-10-CM | POA: Diagnosis not present

## 2019-09-22 DIAGNOSIS — I509 Heart failure, unspecified: Secondary | ICD-10-CM | POA: Diagnosis not present

## 2019-09-22 DIAGNOSIS — I4891 Unspecified atrial fibrillation: Secondary | ICD-10-CM | POA: Diagnosis not present

## 2019-09-22 DIAGNOSIS — R159 Full incontinence of feces: Secondary | ICD-10-CM | POA: Diagnosis not present

## 2019-09-22 DIAGNOSIS — J9610 Chronic respiratory failure, unspecified whether with hypoxia or hypercapnia: Secondary | ICD-10-CM | POA: Diagnosis not present

## 2019-09-22 DIAGNOSIS — I10 Essential (primary) hypertension: Secondary | ICD-10-CM | POA: Diagnosis not present

## 2019-09-22 DIAGNOSIS — Z515 Encounter for palliative care: Secondary | ICD-10-CM | POA: Diagnosis not present

## 2019-09-22 DIAGNOSIS — E2749 Other adrenocortical insufficiency: Secondary | ICD-10-CM | POA: Diagnosis not present

## 2019-09-23 DIAGNOSIS — R32 Unspecified urinary incontinence: Secondary | ICD-10-CM | POA: Diagnosis not present

## 2019-09-23 DIAGNOSIS — I509 Heart failure, unspecified: Secondary | ICD-10-CM | POA: Diagnosis not present

## 2019-09-23 DIAGNOSIS — I4891 Unspecified atrial fibrillation: Secondary | ICD-10-CM | POA: Diagnosis not present

## 2019-09-23 DIAGNOSIS — Z515 Encounter for palliative care: Secondary | ICD-10-CM | POA: Diagnosis not present

## 2019-09-23 DIAGNOSIS — J9610 Chronic respiratory failure, unspecified whether with hypoxia or hypercapnia: Secondary | ICD-10-CM | POA: Diagnosis not present

## 2019-09-23 DIAGNOSIS — I10 Essential (primary) hypertension: Secondary | ICD-10-CM | POA: Diagnosis not present

## 2019-09-23 DIAGNOSIS — E2749 Other adrenocortical insufficiency: Secondary | ICD-10-CM | POA: Diagnosis not present

## 2019-09-23 DIAGNOSIS — R159 Full incontinence of feces: Secondary | ICD-10-CM | POA: Diagnosis not present

## 2019-09-24 DIAGNOSIS — I10 Essential (primary) hypertension: Secondary | ICD-10-CM | POA: Diagnosis not present

## 2019-09-24 DIAGNOSIS — R32 Unspecified urinary incontinence: Secondary | ICD-10-CM | POA: Diagnosis not present

## 2019-09-24 DIAGNOSIS — R159 Full incontinence of feces: Secondary | ICD-10-CM | POA: Diagnosis not present

## 2019-09-24 DIAGNOSIS — J9610 Chronic respiratory failure, unspecified whether with hypoxia or hypercapnia: Secondary | ICD-10-CM | POA: Diagnosis not present

## 2019-09-24 DIAGNOSIS — I509 Heart failure, unspecified: Secondary | ICD-10-CM | POA: Diagnosis not present

## 2019-09-24 DIAGNOSIS — Z515 Encounter for palliative care: Secondary | ICD-10-CM | POA: Diagnosis not present

## 2019-09-24 DIAGNOSIS — E2749 Other adrenocortical insufficiency: Secondary | ICD-10-CM | POA: Diagnosis not present

## 2019-09-24 DIAGNOSIS — I4891 Unspecified atrial fibrillation: Secondary | ICD-10-CM | POA: Diagnosis not present

## 2019-09-25 DIAGNOSIS — R32 Unspecified urinary incontinence: Secondary | ICD-10-CM | POA: Diagnosis not present

## 2019-09-25 DIAGNOSIS — I4891 Unspecified atrial fibrillation: Secondary | ICD-10-CM | POA: Diagnosis not present

## 2019-09-25 DIAGNOSIS — J9610 Chronic respiratory failure, unspecified whether with hypoxia or hypercapnia: Secondary | ICD-10-CM | POA: Diagnosis not present

## 2019-09-25 DIAGNOSIS — Z515 Encounter for palliative care: Secondary | ICD-10-CM | POA: Diagnosis not present

## 2019-09-25 DIAGNOSIS — E2749 Other adrenocortical insufficiency: Secondary | ICD-10-CM | POA: Diagnosis not present

## 2019-09-25 DIAGNOSIS — I10 Essential (primary) hypertension: Secondary | ICD-10-CM | POA: Diagnosis not present

## 2019-09-25 DIAGNOSIS — R159 Full incontinence of feces: Secondary | ICD-10-CM | POA: Diagnosis not present

## 2019-09-25 DIAGNOSIS — I509 Heart failure, unspecified: Secondary | ICD-10-CM | POA: Diagnosis not present

## 2019-09-26 DIAGNOSIS — I10 Essential (primary) hypertension: Secondary | ICD-10-CM | POA: Diagnosis not present

## 2019-09-26 DIAGNOSIS — I4891 Unspecified atrial fibrillation: Secondary | ICD-10-CM | POA: Diagnosis not present

## 2019-09-26 DIAGNOSIS — J9610 Chronic respiratory failure, unspecified whether with hypoxia or hypercapnia: Secondary | ICD-10-CM | POA: Diagnosis not present

## 2019-09-26 DIAGNOSIS — R32 Unspecified urinary incontinence: Secondary | ICD-10-CM | POA: Diagnosis not present

## 2019-09-26 DIAGNOSIS — R159 Full incontinence of feces: Secondary | ICD-10-CM | POA: Diagnosis not present

## 2019-09-26 DIAGNOSIS — Z515 Encounter for palliative care: Secondary | ICD-10-CM | POA: Diagnosis not present

## 2019-09-26 DIAGNOSIS — I509 Heart failure, unspecified: Secondary | ICD-10-CM | POA: Diagnosis not present

## 2019-09-26 DIAGNOSIS — E2749 Other adrenocortical insufficiency: Secondary | ICD-10-CM | POA: Diagnosis not present

## 2019-09-27 DIAGNOSIS — R159 Full incontinence of feces: Secondary | ICD-10-CM | POA: Diagnosis not present

## 2019-09-27 DIAGNOSIS — I4891 Unspecified atrial fibrillation: Secondary | ICD-10-CM | POA: Diagnosis not present

## 2019-09-27 DIAGNOSIS — R32 Unspecified urinary incontinence: Secondary | ICD-10-CM | POA: Diagnosis not present

## 2019-09-27 DIAGNOSIS — Z515 Encounter for palliative care: Secondary | ICD-10-CM | POA: Diagnosis not present

## 2019-09-27 DIAGNOSIS — I509 Heart failure, unspecified: Secondary | ICD-10-CM | POA: Diagnosis not present

## 2019-09-27 DIAGNOSIS — I10 Essential (primary) hypertension: Secondary | ICD-10-CM | POA: Diagnosis not present

## 2019-09-27 DIAGNOSIS — E2749 Other adrenocortical insufficiency: Secondary | ICD-10-CM | POA: Diagnosis not present

## 2019-09-27 DIAGNOSIS — J9610 Chronic respiratory failure, unspecified whether with hypoxia or hypercapnia: Secondary | ICD-10-CM | POA: Diagnosis not present

## 2019-09-28 ENCOUNTER — Encounter (HOSPITAL_COMMUNITY): Payer: Self-pay | Admitting: Emergency Medicine

## 2019-09-28 ENCOUNTER — Other Ambulatory Visit: Payer: Self-pay

## 2019-09-28 ENCOUNTER — Emergency Department (HOSPITAL_COMMUNITY): Payer: BC Managed Care – PPO

## 2019-09-28 ENCOUNTER — Emergency Department (HOSPITAL_COMMUNITY)
Admission: EM | Admit: 2019-09-28 | Discharge: 2019-09-28 | Disposition: A | Discharge status: Home / Self Care | Payer: BC Managed Care – PPO | Attending: Emergency Medicine | Admitting: Emergency Medicine

## 2019-09-28 DIAGNOSIS — R159 Full incontinence of feces: Secondary | ICD-10-CM | POA: Diagnosis not present

## 2019-09-28 DIAGNOSIS — J189 Pneumonia, unspecified organism: Secondary | ICD-10-CM | POA: Diagnosis not present

## 2019-09-28 DIAGNOSIS — I4891 Unspecified atrial fibrillation: Secondary | ICD-10-CM | POA: Insufficient documentation

## 2019-09-28 DIAGNOSIS — I10 Essential (primary) hypertension: Secondary | ICD-10-CM | POA: Diagnosis not present

## 2019-09-28 DIAGNOSIS — Z86718 Personal history of other venous thrombosis and embolism: Secondary | ICD-10-CM | POA: Diagnosis not present

## 2019-09-28 DIAGNOSIS — Z79899 Other long term (current) drug therapy: Secondary | ICD-10-CM | POA: Insufficient documentation

## 2019-09-28 DIAGNOSIS — Z7901 Long term (current) use of anticoagulants: Secondary | ICD-10-CM | POA: Insufficient documentation

## 2019-09-28 DIAGNOSIS — Z515 Encounter for palliative care: Secondary | ICD-10-CM | POA: Diagnosis not present

## 2019-09-28 DIAGNOSIS — R000 Tachycardia, unspecified: Secondary | ICD-10-CM | POA: Diagnosis not present

## 2019-09-28 DIAGNOSIS — R079 Chest pain, unspecified: Secondary | ICD-10-CM | POA: Diagnosis not present

## 2019-09-28 DIAGNOSIS — Z20822 Contact with and (suspected) exposure to covid-19: Secondary | ICD-10-CM | POA: Insufficient documentation

## 2019-09-28 DIAGNOSIS — I509 Heart failure, unspecified: Secondary | ICD-10-CM | POA: Diagnosis not present

## 2019-09-28 DIAGNOSIS — R0789 Other chest pain: Secondary | ICD-10-CM | POA: Diagnosis not present

## 2019-09-28 DIAGNOSIS — J9610 Chronic respiratory failure, unspecified whether with hypoxia or hypercapnia: Secondary | ICD-10-CM | POA: Diagnosis not present

## 2019-09-28 DIAGNOSIS — E2749 Other adrenocortical insufficiency: Secondary | ICD-10-CM | POA: Diagnosis not present

## 2019-09-28 DIAGNOSIS — R0602 Shortness of breath: Secondary | ICD-10-CM | POA: Diagnosis not present

## 2019-09-28 DIAGNOSIS — R32 Unspecified urinary incontinence: Secondary | ICD-10-CM | POA: Diagnosis not present

## 2019-09-28 LAB — CBC WITH DIFFERENTIAL/PLATELET
Abs Immature Granulocytes: 0.73 10*3/uL — ABNORMAL HIGH (ref 0.00–0.07)
Basophils Absolute: 0.1 10*3/uL (ref 0.0–0.1)
Basophils Relative: 1 %
Eosinophils Absolute: 0 10*3/uL (ref 0.0–0.5)
Eosinophils Relative: 0 %
HCT: 41.7 % (ref 36.0–46.0)
Hemoglobin: 12.5 g/dL (ref 12.0–15.0)
Immature Granulocytes: 4 %
Lymphocytes Relative: 1 %
Lymphs Abs: 0.3 10*3/uL — ABNORMAL LOW (ref 0.7–4.0)
MCH: 29.5 pg (ref 26.0–34.0)
MCHC: 30 g/dL (ref 30.0–36.0)
MCV: 98.3 fL (ref 80.0–100.0)
Monocytes Absolute: 1 10*3/uL (ref 0.1–1.0)
Monocytes Relative: 5 %
Neutro Abs: 17.7 10*3/uL — ABNORMAL HIGH (ref 1.7–7.7)
Neutrophils Relative %: 89 %
Platelets: 280 10*3/uL (ref 150–400)
RBC: 4.24 MIL/uL (ref 3.87–5.11)
RDW: 15.5 % (ref 11.5–15.5)
WBC: 19.7 10*3/uL — ABNORMAL HIGH (ref 4.0–10.5)
nRBC: 0 % (ref 0.0–0.2)

## 2019-09-28 LAB — COMPREHENSIVE METABOLIC PANEL
ALT: 18 U/L (ref 0–44)
AST: 22 U/L (ref 15–41)
Albumin: 3.1 g/dL — ABNORMAL LOW (ref 3.5–5.0)
Alkaline Phosphatase: 108 U/L (ref 38–126)
Anion gap: 13 (ref 5–15)
BUN: 12 mg/dL (ref 8–23)
CO2: 33 mmol/L — ABNORMAL HIGH (ref 22–32)
Calcium: 8.6 mg/dL — ABNORMAL LOW (ref 8.9–10.3)
Chloride: 94 mmol/L — ABNORMAL LOW (ref 98–111)
Creatinine, Ser: 0.4 mg/dL — ABNORMAL LOW (ref 0.44–1.00)
GFR calc Af Amer: >60 mL/min (ref >60)
GFR calc non Af Amer: >60 mL/min (ref >60)
Glucose, Bld: 130 mg/dL — ABNORMAL HIGH (ref 70–99)
Potassium: 4.1 mmol/L (ref 3.5–5.1)
Sodium: 140 mmol/L (ref 135–145)
Total Bilirubin: 0.7 mg/dL (ref 0.3–1.2)
Total Protein: 6 g/dL — ABNORMAL LOW (ref 6.5–8.1)

## 2019-09-28 LAB — POC SARS CORONAVIRUS 2 AG -  ED: SARS Coronavirus 2 Ag: NEGATIVE

## 2019-09-28 LAB — RESPIRATORY PANEL BY RT PCR (FLU A&B, COVID)
Influenza A by PCR: NEGATIVE
Influenza B by PCR: NEGATIVE
SARS Coronavirus 2 by RT PCR: NEGATIVE

## 2019-09-28 LAB — BRAIN NATRIURETIC PEPTIDE: B Natriuretic Peptide: 64 pg/mL (ref 0.0–100.0)

## 2019-09-28 LAB — TROPONIN I (HIGH SENSITIVITY)
Troponin I (High Sensitivity): 5 ng/L (ref <18)
Troponin I (High Sensitivity): 6 ng/L (ref <18)

## 2019-09-28 MED ORDER — SODIUM CHLORIDE 0.9 % IV SOLN
100.0000 mg | Freq: Once | INTRAVENOUS | Status: AC
  Administered 2019-09-28: 100 mg via INTRAVENOUS
  Filled 2019-09-28 (×2): qty 100

## 2019-09-28 MED ORDER — AEROCHAMBER Z-STAT PLUS/MEDIUM MISC
Status: AC
  Administered 2019-09-28: 16:00:00 1
  Filled 2019-09-28: qty 1

## 2019-09-28 MED ORDER — ALBUTEROL SULFATE HFA 108 (90 BASE) MCG/ACT IN AERS
2.0000 | INHALATION_SPRAY | RESPIRATORY_TRACT | Status: DC | PRN
  Administered 2019-09-28: 2 via RESPIRATORY_TRACT
  Filled 2019-09-28: qty 6.7

## 2019-09-28 MED ORDER — LEVOFLOXACIN 250 MG PO TABS: 250.0000 mg | ORAL_TABLET | Freq: Every day | ORAL | 0 refills | Status: AC

## 2019-09-28 MED ORDER — DOXYCYCLINE HYCLATE 100 MG PO CAPS: 100.0000 mg | ORAL_CAPSULE | Freq: Two times a day (BID) | ORAL | 0 refills | Status: DC

## 2019-09-28 MED ORDER — METHYLPREDNISOLONE SODIUM SUCC 125 MG IJ SOLR
125.0000 mg | Freq: Once | INTRAMUSCULAR | Status: AC
  Administered 2019-09-28: 125 mg via INTRAVENOUS
  Filled 2019-09-28: qty 2

## 2019-09-28 NOTE — ED Notes (Signed)
Discharge instructions reviewed with pt and all questions answered. Pt unable to sign due to signature pd not working at this time.

## 2019-09-28 NOTE — ED Triage Notes (Signed)
PT c/o intermittent chest tightness, increased SOB on exertion over the past 2 weeks. PT sats 97% on her normal 3L n/c that she wears at home.

## 2019-09-28 NOTE — Discharge Instructions (Addendum)
Follow-up with your doctor for recheck within a week.  Return here if any problem

## 2019-09-28 NOTE — ED Provider Notes (Addendum)
[facility] EMERGENCY DEPARTMENT Provider Note   CSN: [id number] Arrival date & time: 09/28/19  1420     History Chief Complaint  Patient presents with  . Shortness of Breath    Betty Bryan is a 64 y.o. female.  Patient complains of increasing shortness of breath.  Patient has adrenal insufficiency along with atrial fib and oxygen requirement at 3 L at home.  The history is provided by the patient. No language interpreter was used.  Shortness of Breath Severity:  Mild Onset quality:  Sudden Timing:  Constant Progression:  Waxing and waning Chronicity:  New Context: activity   Associated symptoms: no abdominal pain, no chest pain, no cough, no headaches and no rash        Past Medical History:  Diagnosis Date  . Adrenal insufficiency ([place])   . HTN (hypertension)   . L4 vertebral fracture ([facility])    L4-L5 fracture  . Palpitations   . Paroxysmal A-fib ([place])   . PVC's (premature ventricular contractions)   . Ulcerative colitis     Patient Active Problem List   Diagnosis Date Noted  . Compression fracture of vertebrae, non-traumatic ([place]) 06/14/2019  . DNR (do not resuscitate) discussion   . Encounter for hospice care discussion   . Vertebral fracture, osteoporotic ([place]) 06/12/2019  . Uncontrolled pain 06/12/2019  . Sepsis due to pneumonia ([place]) 05/20/2019  . Cellulitis 05/20/2019  . Adrenal insufficiency ([place]) 05/20/2019  . Palliative care by specialist   . Pain of left lower extremity   . Acute on chronic respiratory failure with hypoxia ([place]) 05/09/2019  . Lobar pneumonia ([place]) 05/09/2019  . Sepsis due to undetermined organism ([place]) 05/09/2019  . Goals of care, counseling/discussion 05/09/2019  . Pressure injury of skin 05/09/2019  . Sepsis ([place]) 05/08/2019  . Tachycardia 05/08/2019  . Pleural effusion, left 05/08/2019  . Essential hypertension 04/16/2019  . PVC's (premature ventricular contractions) 04/16/2019  . History of DVT (deep vein thrombosis)  04/16/2019  . Chronic respiratory failure with hypoxia ([place]) 01/29/2019  . AF (paroxysmal atrial fibrillation) ([place]) 01/29/2019  . Hypomagnesemia   . Swelling of both lower extremities   . Hypokalemia 01/28/2019  . Pain 12/23/2018  . Fall at home, initial encounter 12/23/2018  . Multiple rib fractures 12/23/2018  . Clavicle fracture 12/23/2018  . CAP (community acquired pneumonia) 12/23/2018  . Rib pain on right side 01/05/2018  . Varicose veins of leg with complications 0000000  . Chronic venous insufficiency 06/23/2015    Past Surgical History:  Procedure Laterality Date  . HAND SURGERY    . NASAL SINUS SURGERY       OB History   No obstetric history on file.     Family History  Problem Relation Age of Onset  . Heart failure Other   . Diabetes Father   . Heart disease Father   . Hypertension Father     Social History   Tobacco Use  . Smoking status: Never Smoker  . Smokeless tobacco: Never Used  Substance Use Topics  . Alcohol use: No    Alcohol/week: 0.0 standard drinks  . Drug use: No    Home Medications Prior to Admission medications   Medication Sig Start Date End Date Taking? Authorizing Provider  atenolol (TENORMIN) 50 MG tablet Take 25 mg by mouth 3 (three) times daily.    [provider]  balsalazide (COLAZAL) 750 MG capsule Take 2,250 mg by mouth 3 (three) times daily.    [provider]  calcium-vitamin D (OSCAL WITH D) 500-200 MG-UNIT tablet Take 1 tablet by mouth daily. 05/23/19   Aline August, MD  diazepam (VALIUM) 5 MG tablet Take 1 tablet by mouth daily as needed. Take 0.5 tab PO QD 06/25/19   [provider]  doxycycline (VIBRAMYCIN) 100 MG capsule Take 1 capsule (100 mg total) by mouth 2 (two) times daily. One po bid x 7 days 09/28/19   Milton Ferguson, MD  ELIQUIS 2.5 MG TABS tablet Take 2.5 mg by mouth 2 (two) times daily.  04/28/18   [provider]  fluticasone (FLONASE) 50 MCG/ACT nasal spray Place 1  spray into both nostrils 2 (two) times daily.     [provider]  furosemide (LASIX) 20 MG tablet Take 1 tablet (20 mg total) by mouth daily. Patient taking differently: Take 20 mg by mouth every other day.  05/23/19   Aline August, MD  gabapentin (NEURONTIN) 100 MG capsule Take 100-300 mg by mouth 3 (three) times daily as needed (for nerve pain).     [provider]  HYDROCORTISONE ACE, RECTAL, 30 MG SUPP Place 1 suppository rectally at bedtime.  06/08/15   [provider]  loratadine (CLARITIN) 10 MG tablet Take 10 mg by mouth daily.    [provider]  nystatin (MYCOSTATIN/NYSTOP) powder Apply 1 application topically 3 (three) times daily.  05/08/18   [provider]  oxyCODONE-acetaminophen (PERCOCET/ROXICET) 5-325 MG tablet Take 1-2 tablets by mouth every 6 (six) hours as needed for moderate pain or severe pain. 12/31/18   Manuella Ghazi, Pratik D, DO  pantoprazole (PROTONIX) 20 MG tablet Take 20 mg by mouth daily.    [provider]  potassium chloride (KLOR-CON) 10 MEQ tablet Take 10 mEq by mouth daily.    [provider]  predniSONE (DELTASONE) 5 MG tablet Take 7.5-8 mg by mouth daily. As directed by your physician 05/05/19   [provider]    Allergies    Penicillins, Sulfa antibiotics, Morphine and related, and Voltaren [diclofenac]  Review of Systems   Review of Systems  Constitutional: Negative for appetite change and fatigue.  HENT: Negative for congestion, ear discharge and sinus pressure.   Eyes: Negative for discharge.  Respiratory: Positive for shortness of breath. Negative for cough.   Cardiovascular: Negative for chest pain.  Gastrointestinal: Negative for abdominal pain and diarrhea.  Genitourinary: Negative for frequency and hematuria.  Musculoskeletal: Negative for back pain.  Skin: Negative for rash.  Neurological: Negative for seizures and headaches.  Psychiatric/Behavioral: Negative for hallucinations.     Physical Exam Updated Vital Signs BP (!) 132/93   Pulse (!) 117   Temp 97.9 F (36.6 C)   Resp (!) 21   Ht 5' (1.524 m)   Wt 57.2 kg   SpO2 97%   BMI 24.61 kg/m   Physical Exam Vitals and nursing note reviewed.  Constitutional:      Appearance: She is well-developed.  HENT:     Head: Normocephalic.     Nose: Nose normal.  Eyes:     General: No scleral icterus.    Conjunctiva/sclera: Conjunctivae normal.  Neck:     Thyroid: No thyromegaly.  Cardiovascular:     Rate and Rhythm: Tachycardia present.     Heart sounds: No murmur. No friction rub. No gallop.   Pulmonary:     Breath sounds: No stridor. No wheezing or rales.  Chest:     Chest wall: No tenderness.  Abdominal:     General: There is  no distension.     Tenderness: There is no abdominal tenderness. There is no rebound.  Musculoskeletal:        General: Normal range of motion.     Cervical back: Neck supple.  Lymphadenopathy:     Cervical: No cervical adenopathy.  Skin:    Findings: No erythema or rash.  Neurological:     Mental Status: She is alert and oriented to person, place, and time.     Motor: No abnormal muscle tone.     Coordination: Coordination normal.  Psychiatric:        Behavior: Behavior normal.     ED Results / Procedures / Treatments   Labs (all labs ordered are listed, but only abnormal results are displayed) Labs Reviewed  CBC WITH DIFFERENTIAL/PLATELET - Abnormal; Notable for the following components:      Result Value   WBC 19.7 (*)    Neutro Abs 17.7 (*)    Lymphs Abs 0.3 (*)    Abs Immature Granulocytes 0.73 (*)    All other components within normal limits  COMPREHENSIVE METABOLIC PANEL - Abnormal; Notable for the following components:   Chloride 94 (*)    CO2 33 (*)    Glucose, Bld 130 (*)    Creatinine, Ser 0.40 (*)    Calcium 8.6 (*)    Total Protein 6.0 (*)    Albumin 3.1 (*)    All other components within normal limits  RESPIRATORY PANEL BY RT PCR (FLU A&B,  COVID)  BRAIN NATRIURETIC PEPTIDE  POC SARS CORONAVIRUS 2 AG -  ED  TROPONIN I (HIGH SENSITIVITY)  TROPONIN I (HIGH SENSITIVITY)    EKG None  Radiology DG Chest Portable 1 View  Result Date: 09/28/2019 CLINICAL DATA:  Shortness of breath, intermittent chest tightness, increased shortness of breath with exertion over past 2 weeks, history hypertension, paroxysmal atrial fibrillation EXAM: PORTABLE CHEST 1 VIEW COMPARISON:  Portable exam 1546 hours compared to 07/01/2019 FINDINGS: Kyphotic position, rotated to the LEFT. Patient's face and chin obscure the lung apices particularly on LEFT. LEFT pleural effusion and basilar atelectasis versus consolidation persist. Visualized RIGHT lung grossly clear. Diffuse osseous demineralization. No gross pneumothorax identified. IMPRESSION: Limited exam due to patient position. Persistent LEFT pleural effusion and basilar atelectasis versus consolidation. Electronically Signed   By: Lavonia Dana M.D.   On: 09/28/2019 16:01    Procedures Procedures (including critical care time)  Medications Ordered in ED Medications  albuterol (VENTOLIN HFA) 108 (90 Base) MCG/ACT inhaler 2 puff (2 puffs Inhalation Given 09/28/19 1540)  doxycycline (VIBRAMYCIN) 100 mg in sodium chloride 0.9 % 250 mL IVPB (has no administration in time range)  methylPREDNISolone sodium succinate (SOLU-MEDROL) 125 mg/2 mL injection 125 mg (125 mg Intravenous Given 09/28/19 1547)  aerochamber Z-Stat Plus/medium (1 each  Given 09/28/19 1541)    ED Course  I have reviewed the triage vital signs and the nursing notes.  Pertinent labs & imaging results that were available during my care of the patient were reviewed by me and considered in my medical decision making (see chart for details).    MDM Rules/Calculators/A&P                      Chest x-ray suggesting pneumonia.  Patient is mildly tachycardic and states she is more short of breath than normal.  Her O2 sat has been staying over 90 on  her normal amount of nasal oxygen.  I suggested she be admitted to the hospital  for treatment of pneumonia but the patient refused admission and wanted to be treated as an outpatient.  She will be started on doxycycline and will follow up.    Patient stated that she did not want doxycycline.   Patient was sent home with Levaquin instead Final Clinical Impression(s) / ED Diagnoses Final diagnoses:  Community acquired pneumonia, unspecified laterality    Rx / DC Orders ED Discharge Orders         Ordered    doxycycline (VIBRAMYCIN) 100 MG capsule  2 times daily     09/28/19 1753           Milton Ferguson, MD 09/29/19 1042    Milton Ferguson, MD 09/29/19 1043

## 2019-09-29 DIAGNOSIS — I4891 Unspecified atrial fibrillation: Secondary | ICD-10-CM | POA: Diagnosis not present

## 2019-09-29 DIAGNOSIS — E2749 Other adrenocortical insufficiency: Secondary | ICD-10-CM | POA: Diagnosis not present

## 2019-09-29 DIAGNOSIS — I10 Essential (primary) hypertension: Secondary | ICD-10-CM | POA: Diagnosis not present

## 2019-09-29 DIAGNOSIS — R159 Full incontinence of feces: Secondary | ICD-10-CM | POA: Diagnosis not present

## 2019-09-29 DIAGNOSIS — Z515 Encounter for palliative care: Secondary | ICD-10-CM | POA: Diagnosis not present

## 2019-09-29 DIAGNOSIS — J9610 Chronic respiratory failure, unspecified whether with hypoxia or hypercapnia: Secondary | ICD-10-CM | POA: Diagnosis not present

## 2019-09-29 DIAGNOSIS — R32 Unspecified urinary incontinence: Secondary | ICD-10-CM | POA: Diagnosis not present

## 2019-09-29 DIAGNOSIS — I509 Heart failure, unspecified: Secondary | ICD-10-CM | POA: Diagnosis not present

## 2019-09-30 DIAGNOSIS — I10 Essential (primary) hypertension: Secondary | ICD-10-CM | POA: Diagnosis not present

## 2019-09-30 DIAGNOSIS — Z515 Encounter for palliative care: Secondary | ICD-10-CM | POA: Diagnosis not present

## 2019-09-30 DIAGNOSIS — J9610 Chronic respiratory failure, unspecified whether with hypoxia or hypercapnia: Secondary | ICD-10-CM | POA: Diagnosis not present

## 2019-09-30 DIAGNOSIS — I509 Heart failure, unspecified: Secondary | ICD-10-CM | POA: Diagnosis not present

## 2019-09-30 DIAGNOSIS — R32 Unspecified urinary incontinence: Secondary | ICD-10-CM | POA: Diagnosis not present

## 2019-09-30 DIAGNOSIS — E2749 Other adrenocortical insufficiency: Secondary | ICD-10-CM | POA: Diagnosis not present

## 2019-09-30 DIAGNOSIS — I4891 Unspecified atrial fibrillation: Secondary | ICD-10-CM | POA: Diagnosis not present

## 2019-09-30 DIAGNOSIS — R159 Full incontinence of feces: Secondary | ICD-10-CM | POA: Diagnosis not present

## 2019-10-01 DIAGNOSIS — I509 Heart failure, unspecified: Secondary | ICD-10-CM | POA: Diagnosis not present

## 2019-10-01 DIAGNOSIS — Z515 Encounter for palliative care: Secondary | ICD-10-CM | POA: Diagnosis not present

## 2019-10-01 DIAGNOSIS — E2749 Other adrenocortical insufficiency: Secondary | ICD-10-CM | POA: Diagnosis not present

## 2019-10-01 DIAGNOSIS — I10 Essential (primary) hypertension: Secondary | ICD-10-CM | POA: Diagnosis not present

## 2019-10-01 DIAGNOSIS — R159 Full incontinence of feces: Secondary | ICD-10-CM | POA: Diagnosis not present

## 2019-10-01 DIAGNOSIS — I4891 Unspecified atrial fibrillation: Secondary | ICD-10-CM | POA: Diagnosis not present

## 2019-10-01 DIAGNOSIS — J9610 Chronic respiratory failure, unspecified whether with hypoxia or hypercapnia: Secondary | ICD-10-CM | POA: Diagnosis not present

## 2019-10-01 DIAGNOSIS — R32 Unspecified urinary incontinence: Secondary | ICD-10-CM | POA: Diagnosis not present

## 2019-10-02 DIAGNOSIS — Z515 Encounter for palliative care: Secondary | ICD-10-CM | POA: Diagnosis not present

## 2019-10-02 DIAGNOSIS — I509 Heart failure, unspecified: Secondary | ICD-10-CM | POA: Diagnosis not present

## 2019-10-02 DIAGNOSIS — R32 Unspecified urinary incontinence: Secondary | ICD-10-CM | POA: Diagnosis not present

## 2019-10-02 DIAGNOSIS — J9610 Chronic respiratory failure, unspecified whether with hypoxia or hypercapnia: Secondary | ICD-10-CM | POA: Diagnosis not present

## 2019-10-02 DIAGNOSIS — R159 Full incontinence of feces: Secondary | ICD-10-CM | POA: Diagnosis not present

## 2019-10-02 DIAGNOSIS — I4891 Unspecified atrial fibrillation: Secondary | ICD-10-CM | POA: Diagnosis not present

## 2019-10-02 DIAGNOSIS — I10 Essential (primary) hypertension: Secondary | ICD-10-CM | POA: Diagnosis not present

## 2019-10-02 DIAGNOSIS — E2749 Other adrenocortical insufficiency: Secondary | ICD-10-CM | POA: Diagnosis not present

## 2019-10-03 DIAGNOSIS — I4891 Unspecified atrial fibrillation: Secondary | ICD-10-CM | POA: Diagnosis not present

## 2019-10-03 DIAGNOSIS — J9610 Chronic respiratory failure, unspecified whether with hypoxia or hypercapnia: Secondary | ICD-10-CM | POA: Diagnosis not present

## 2019-10-03 DIAGNOSIS — R159 Full incontinence of feces: Secondary | ICD-10-CM | POA: Diagnosis not present

## 2019-10-03 DIAGNOSIS — R32 Unspecified urinary incontinence: Secondary | ICD-10-CM | POA: Diagnosis not present

## 2019-10-03 DIAGNOSIS — I509 Heart failure, unspecified: Secondary | ICD-10-CM | POA: Diagnosis not present

## 2019-10-03 DIAGNOSIS — I10 Essential (primary) hypertension: Secondary | ICD-10-CM | POA: Diagnosis not present

## 2019-10-03 DIAGNOSIS — Z515 Encounter for palliative care: Secondary | ICD-10-CM | POA: Diagnosis not present

## 2019-10-03 DIAGNOSIS — E2749 Other adrenocortical insufficiency: Secondary | ICD-10-CM | POA: Diagnosis not present

## 2019-10-04 DIAGNOSIS — E2749 Other adrenocortical insufficiency: Secondary | ICD-10-CM | POA: Diagnosis not present

## 2019-10-04 DIAGNOSIS — R32 Unspecified urinary incontinence: Secondary | ICD-10-CM | POA: Diagnosis not present

## 2019-10-04 DIAGNOSIS — J9610 Chronic respiratory failure, unspecified whether with hypoxia or hypercapnia: Secondary | ICD-10-CM | POA: Diagnosis not present

## 2019-10-04 DIAGNOSIS — Z515 Encounter for palliative care: Secondary | ICD-10-CM | POA: Diagnosis not present

## 2019-10-04 DIAGNOSIS — R159 Full incontinence of feces: Secondary | ICD-10-CM | POA: Diagnosis not present

## 2019-10-04 DIAGNOSIS — I509 Heart failure, unspecified: Secondary | ICD-10-CM | POA: Diagnosis not present

## 2019-10-04 DIAGNOSIS — I10 Essential (primary) hypertension: Secondary | ICD-10-CM | POA: Diagnosis not present

## 2019-10-04 DIAGNOSIS — I4891 Unspecified atrial fibrillation: Secondary | ICD-10-CM | POA: Diagnosis not present

## 2019-10-05 DIAGNOSIS — Z515 Encounter for palliative care: Secondary | ICD-10-CM | POA: Diagnosis not present

## 2019-10-05 DIAGNOSIS — I509 Heart failure, unspecified: Secondary | ICD-10-CM | POA: Diagnosis not present

## 2019-10-05 DIAGNOSIS — E2749 Other adrenocortical insufficiency: Secondary | ICD-10-CM | POA: Diagnosis not present

## 2019-10-05 DIAGNOSIS — R32 Unspecified urinary incontinence: Secondary | ICD-10-CM | POA: Diagnosis not present

## 2019-10-05 DIAGNOSIS — I4891 Unspecified atrial fibrillation: Secondary | ICD-10-CM | POA: Diagnosis not present

## 2019-10-05 DIAGNOSIS — R159 Full incontinence of feces: Secondary | ICD-10-CM | POA: Diagnosis not present

## 2019-10-05 DIAGNOSIS — I10 Essential (primary) hypertension: Secondary | ICD-10-CM | POA: Diagnosis not present

## 2019-10-05 DIAGNOSIS — J9610 Chronic respiratory failure, unspecified whether with hypoxia or hypercapnia: Secondary | ICD-10-CM | POA: Diagnosis not present

## 2019-10-07 ENCOUNTER — Encounter (HOSPITAL_COMMUNITY): Payer: BC Managed Care – PPO

## 2019-10-25 DEATH — deceased

## 2020-02-14 ENCOUNTER — Telehealth: Payer: Self-pay | Admitting: Cardiology

## 2020-02-14 NOTE — Telephone Encounter (Signed)
Patient has passed away.

## 2020-12-05 IMAGING — CR PORTABLE CHEST - 1 VIEW
1 series · 1 of 1 positions shown · non-contrast
Comparison: Yesterday

CLINICAL DATA: Shortness of breath.  Probable aspiration pneumonia.

EXAM:
PORTABLE CHEST 1 VIEW

[portable]
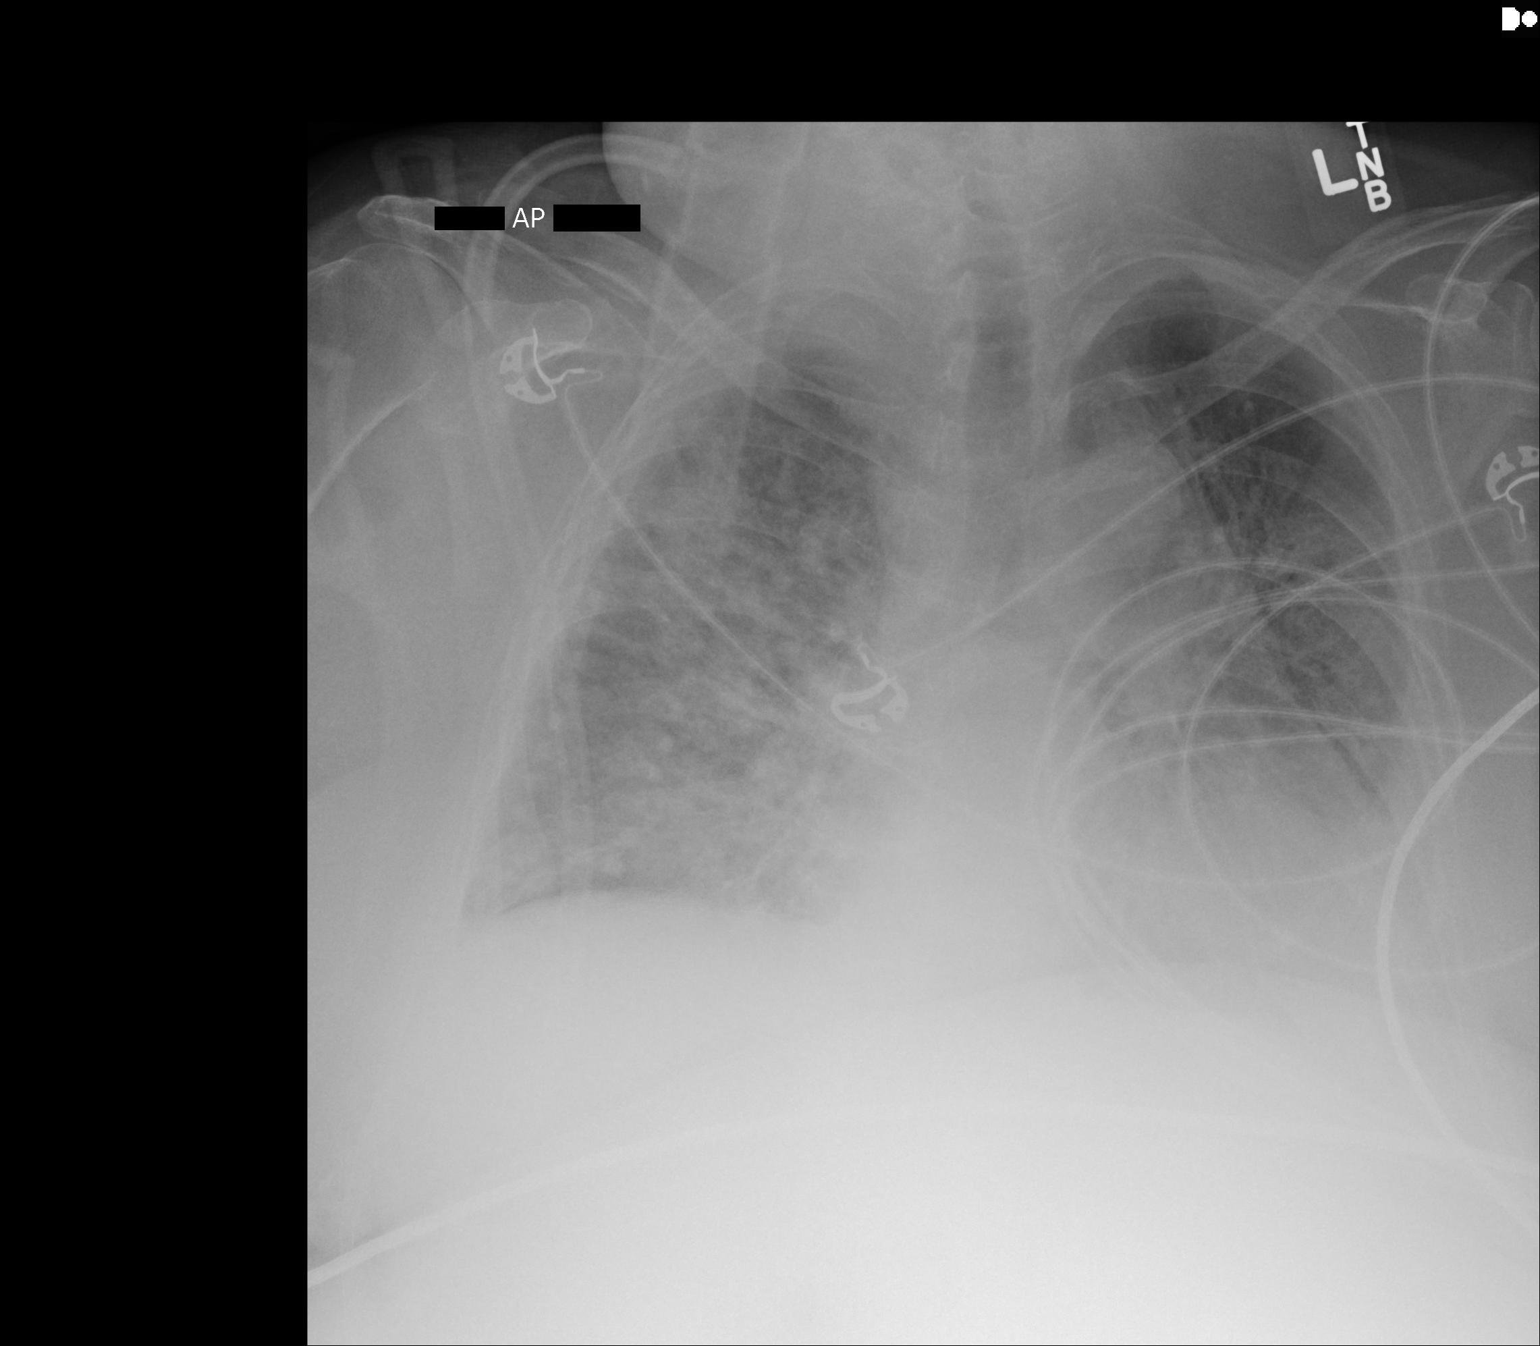

[1 of 1 positions shown; findings below may reference images not displayed]

FINDINGS: Generalized interstitial and airspace opacity that has progressed.
Cardiomegaly and vascular pedicle widening. No pneumothorax
IMPRESSION: Progressive pulmonary opacification with history of suspected
aspiration pneumonia. There is cardiomegaly and opacity has become
generalized, there may be superimposed edema.

## 2021-04-20 IMAGING — CT CT CHEST WITHOUT CONTRAST
4 of 6 series · 16 of 36 positions shown, 18 images · non-contrast
Comparison: Chest CT angiogram dated [DATE].

CLINICAL DATA: LEFT pleural effusion, admitted to hospital on
[REDACTED] with sepsis and cellulitis. Multiple spine fractures.

EXAM:
CT CHEST WITHOUT CONTRAST
TECHNIQUE: Multidetector CT imaging of the chest was performed following the
standard protocol without IV contrast.

[Series 2: routine chest without · axial · non-contrast · 0.69mm/px · z∈[-236,-56]mm · 6 of 128 slices shown, 8 images (1 of 2)]
[im 19/128  mediastinal]
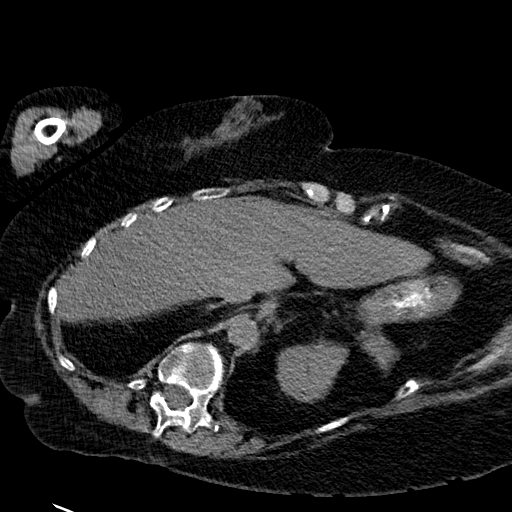
[im 19/128  lung]
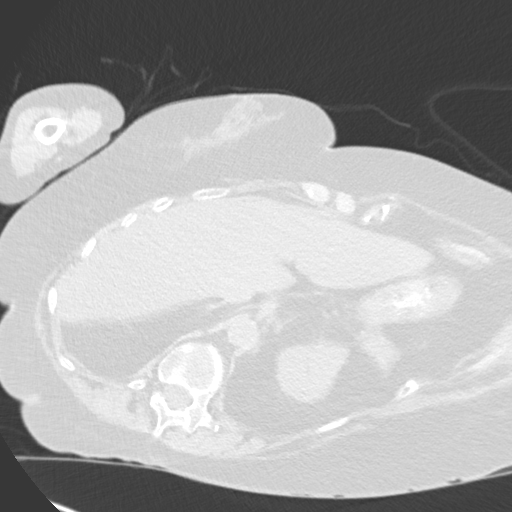
[im 37/128  lung]
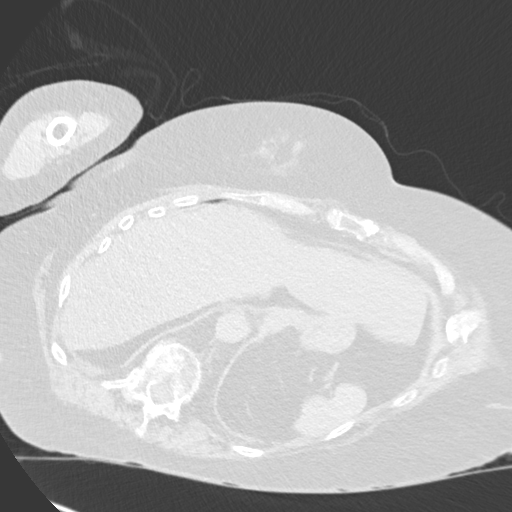
[im 55/128  lung]
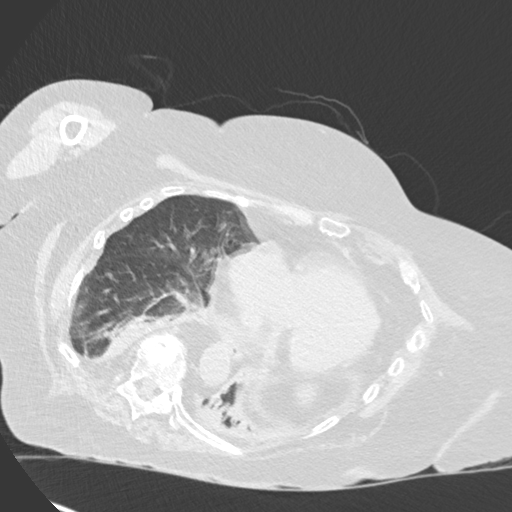
[im 73/128  lung]
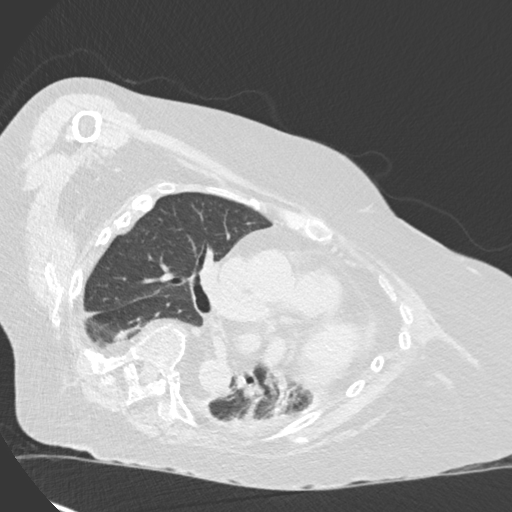
[im 91/128  mediastinal]
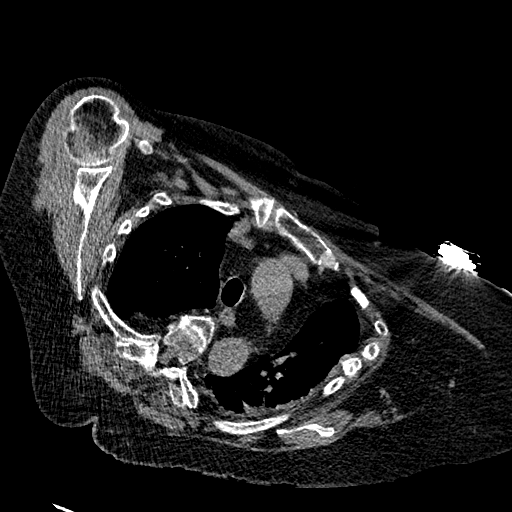
[im 91/128  lung]
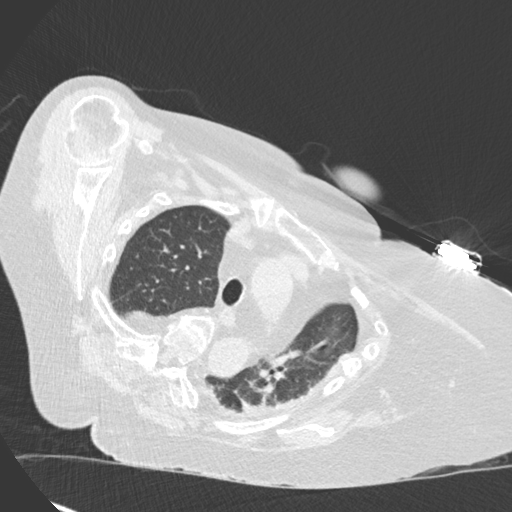
[im 109/128  lung]
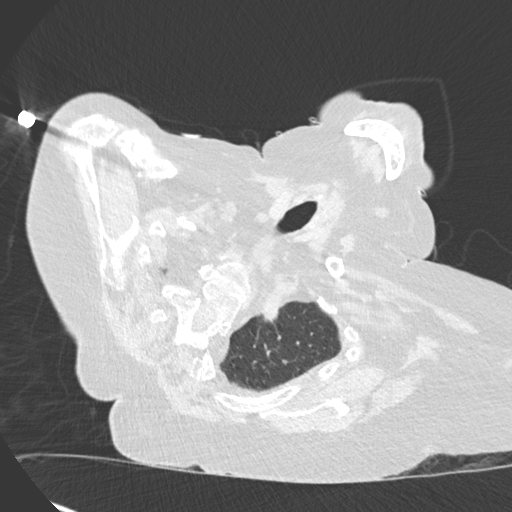

[Series 5: coronal · coronal · 0.50mm/px · 3 of 115 slices shown]
[im 23/115  lung]
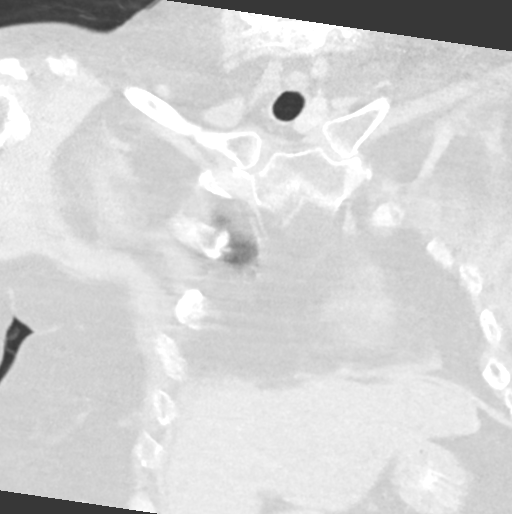
[im 46/115  lung]
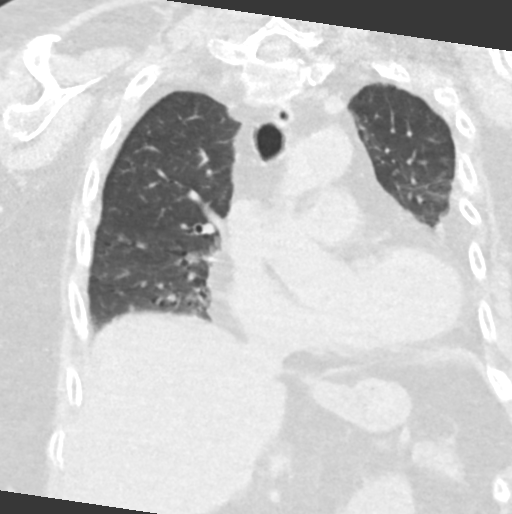
[im 69/115  lung]
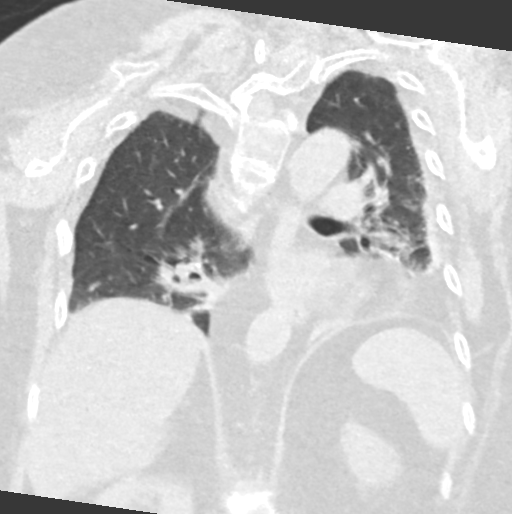

[Series 8: routine chest without · axial · non-contrast · 0.44mm/px · z∈[-250,-80]mm · 5 of 130 slices shown (2 of 2)]
[im 22/130  lung]
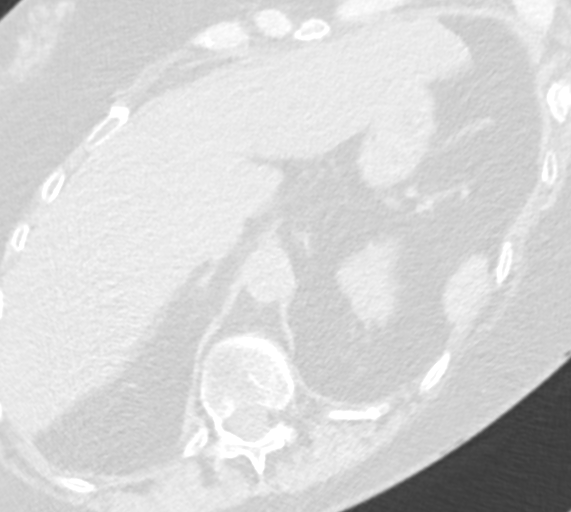
[im 44/130  lung]
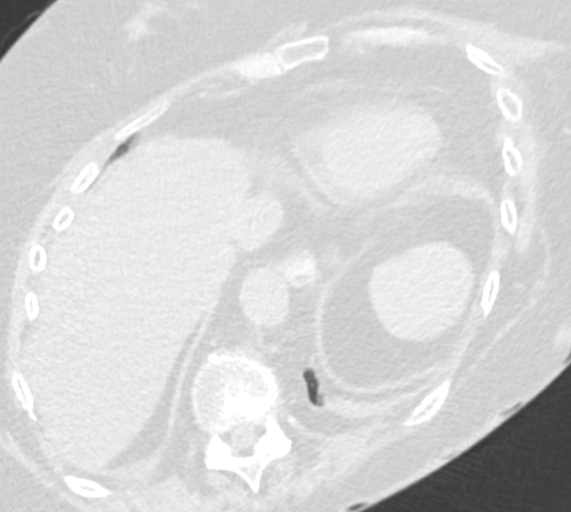
[im 65/130  lung]
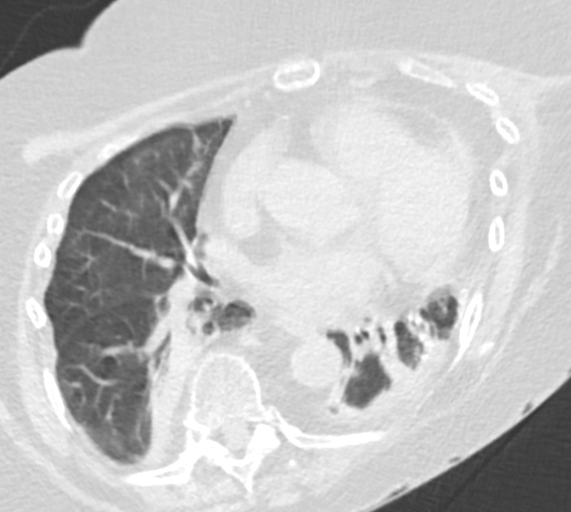
[im 87/130  lung]
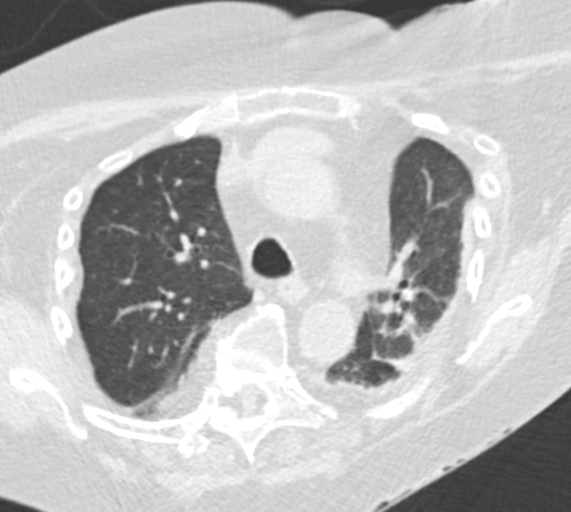
[im 108/130  lung]
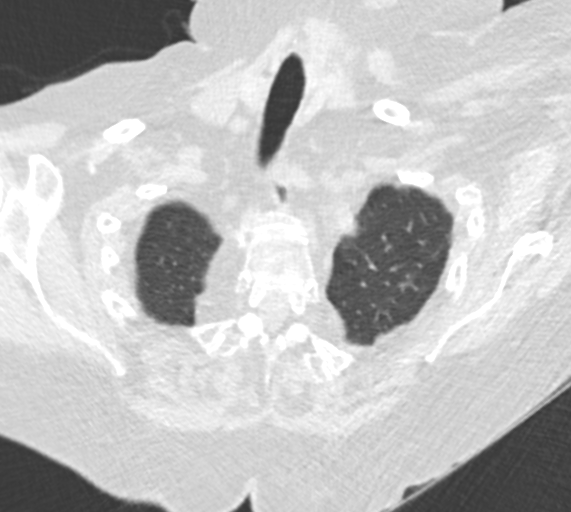

[Series 9: lungs · axial · 0.56mm/px · z∈[-263,-222]mm · 2 of 126 slices shown]
[im 21/126  lung]
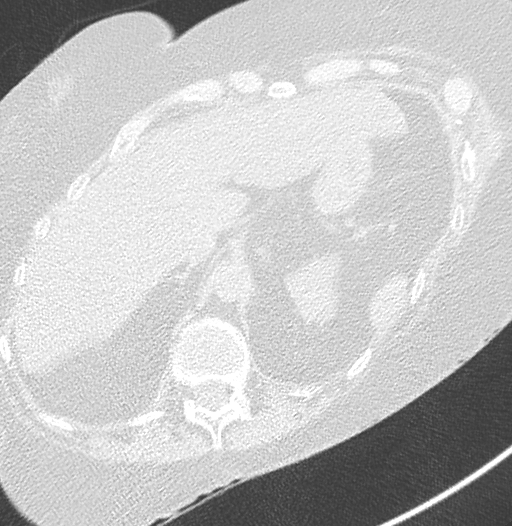
[im 42/126  lung]
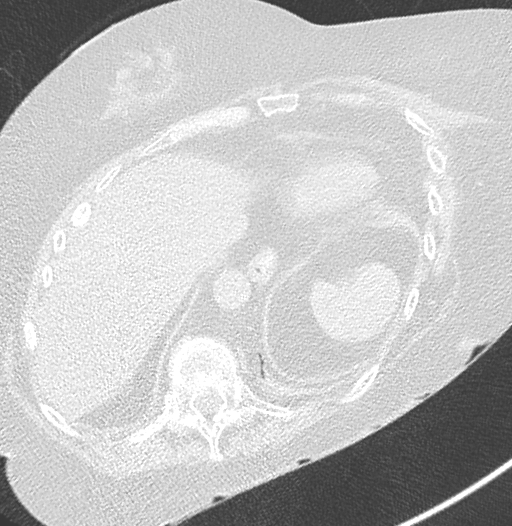

[16 of 36 positions shown; findings below may reference images not displayed]

FINDINGS: Cardiovascular: Stable cardiomegaly. No pericardial effusion. No
thoracic aortic aneurysm.

Mediastinum/Nodes: No mass or enlarged lymph nodes seen within the
mediastinum. Esophagus is unremarkable. Trachea and central bronchi
are unremarkable.

Lungs/Pleura: Slightly increased consolidations within the LEFT
lower lobe, atelectasis versus pneumonia, favor atelectasis. Stable
small consolidations within the lingula and at the medial aspects of
the RIGHT lower lobe, compatible with atelectasis. No new
consolidations. No pleural effusion or pneumothorax.

Upper Abdomen: Limited images of the upper abdomen are unremarkable.

Musculoskeletal: Again noted are multiple compression fracture
deformities within the mid and lower thoracic spine, as described on
the recent chest CT angiogram of 04/26/2019. Again noted are the
post fractures of the LEFT ribs and RIGHT clavicle. No new/acute
osseous findings.
IMPRESSION: 1. Slightly increased consolidations within the LEFT lower lobe,
atelectasis versus pneumonia, favor atelectasis.
2. Stable small consolidations within the lingula and at the medial
aspects of the RIGHT lower lobe, compatible with atelectasis.
3. No pleural effusion.
4. Stable cardiomegaly.
5. Multiple compression fracture deformities within the mid and
lower thoracic spine, as described on the recent chest CT angiogram
of 04/26/2019. No new/acute osseous findings.
